# Patient Record
Sex: Female | Born: 1945
Health system: Southern US, Community
[De-identification: ages and names within clinical notes are randomized; demographics above are authoritative.]

## PROBLEM LIST (undated history)

## (undated) DIAGNOSIS — R7401 Elevation of levels of liver transaminase levels: Secondary | ICD-10-CM

## (undated) DIAGNOSIS — R531 Weakness: Secondary | ICD-10-CM

## (undated) DIAGNOSIS — K219 Gastro-esophageal reflux disease without esophagitis: Secondary | ICD-10-CM

## (undated) DIAGNOSIS — D696 Thrombocytopenia, unspecified: Secondary | ICD-10-CM

## (undated) DIAGNOSIS — I700 Atherosclerosis of aorta: Secondary | ICD-10-CM

## (undated) DIAGNOSIS — I10 Essential (primary) hypertension: Secondary | ICD-10-CM

## (undated) DIAGNOSIS — N390 Urinary tract infection, site not specified: Secondary | ICD-10-CM

## (undated) DIAGNOSIS — T8859XA Other complications of anesthesia, initial encounter: Secondary | ICD-10-CM

## (undated) DIAGNOSIS — B1910 Unspecified viral hepatitis B without hepatic coma: Secondary | ICD-10-CM

## (undated) DIAGNOSIS — I2510 Atherosclerotic heart disease of native coronary artery without angina pectoris: Secondary | ICD-10-CM

## (undated) DIAGNOSIS — K4090 Unilateral inguinal hernia, without obstruction or gangrene, not specified as recurrent: Secondary | ICD-10-CM

## (undated) DIAGNOSIS — K828 Other specified diseases of gallbladder: Secondary | ICD-10-CM

## (undated) DIAGNOSIS — N3001 Acute cystitis with hematuria: Secondary | ICD-10-CM

## (undated) DIAGNOSIS — R112 Nausea with vomiting, unspecified: Secondary | ICD-10-CM

## (undated) DIAGNOSIS — K5900 Constipation, unspecified: Secondary | ICD-10-CM

## (undated) DIAGNOSIS — R110 Nausea: Secondary | ICD-10-CM

## (undated) DIAGNOSIS — Z9889 Other specified postprocedural states: Secondary | ICD-10-CM

## (undated) DIAGNOSIS — I447 Left bundle-branch block, unspecified: Secondary | ICD-10-CM

## (undated) DIAGNOSIS — K589 Irritable bowel syndrome without diarrhea: Secondary | ICD-10-CM

## (undated) DIAGNOSIS — D72819 Decreased white blood cell count, unspecified: Secondary | ICD-10-CM

## (undated) DIAGNOSIS — N183 Chronic kidney disease, stage 3 unspecified: Secondary | ICD-10-CM

## (undated) DIAGNOSIS — Z7982 Long term (current) use of aspirin: Secondary | ICD-10-CM

## (undated) DIAGNOSIS — R87629 Unspecified abnormal cytological findings in specimens from vagina: Secondary | ICD-10-CM

## (undated) DIAGNOSIS — M7061 Trochanteric bursitis, right hip: Secondary | ICD-10-CM

## (undated) DIAGNOSIS — B370 Candidal stomatitis: Secondary | ICD-10-CM

## (undated) DIAGNOSIS — N189 Chronic kidney disease, unspecified: Secondary | ICD-10-CM

## (undated) DIAGNOSIS — R600 Localized edema: Secondary | ICD-10-CM

## (undated) DIAGNOSIS — A770 Spotted fever due to Rickettsia rickettsii: Secondary | ICD-10-CM

## (undated) DIAGNOSIS — N289 Disorder of kidney and ureter, unspecified: Secondary | ICD-10-CM

## (undated) DIAGNOSIS — E7800 Pure hypercholesterolemia, unspecified: Secondary | ICD-10-CM

## (undated) DIAGNOSIS — I83893 Varicose veins of bilateral lower extremities with other complications: Secondary | ICD-10-CM

## (undated) DIAGNOSIS — M1990 Unspecified osteoarthritis, unspecified site: Secondary | ICD-10-CM

## (undated) DIAGNOSIS — D649 Anemia, unspecified: Secondary | ICD-10-CM

## (undated) DIAGNOSIS — I209 Angina pectoris, unspecified: Secondary | ICD-10-CM

## (undated) HISTORY — PX: COLONOSCOPY: SHX174

## (undated) HISTORY — DX: Nausea: R11.0

## (undated) HISTORY — PX: TONSILLECTOMY: SUR1361

## (undated) HISTORY — DX: Disorder of kidney and ureter, unspecified: N28.9

## (undated) HISTORY — DX: Candidal stomatitis: B37.0

## (undated) HISTORY — DX: Acute cystitis with hematuria: N30.01

## (undated) HISTORY — DX: Unspecified abnormal cytological findings in specimens from vagina: R87.629

## (undated) HISTORY — DX: Localized edema: R60.0

## (undated) HISTORY — DX: Chronic kidney disease, unspecified: N18.9

## (undated) HISTORY — DX: Atherosclerotic heart disease of native coronary artery without angina pectoris: I25.10

## (undated) HISTORY — DX: Weakness: R53.1

## (undated) HISTORY — DX: Chronic kidney disease, stage 3 unspecified: N18.30

## (undated) HISTORY — DX: Other specified diseases of gallbladder: K82.8

## (undated) HISTORY — DX: Constipation, unspecified: K59.00

## (undated) HISTORY — DX: Urinary tract infection, site not specified: N39.0

---
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
## 1970-09-25 HISTORY — PX: DILATION AND CURETTAGE, DIAGNOSTIC / THERAPEUTIC: SUR384

## 1973-09-25 DIAGNOSIS — B1910 Unspecified viral hepatitis B without hepatic coma: Secondary | ICD-10-CM

## 1973-09-25 HISTORY — DX: Unspecified viral hepatitis B without hepatic coma: B19.10

## 1980-09-25 HISTORY — PX: DILATION AND CURETTAGE OF UTERUS: SHX78

## 1999-05-03 ENCOUNTER — Other Ambulatory Visit: Admission: RE | Admit: 1999-05-03 | Discharge: 1999-05-03 | Payer: Self-pay | Admitting: Gynecology

## 1999-05-10 ENCOUNTER — Encounter: Payer: Self-pay | Admitting: Gastroenterology

## 1999-05-10 ENCOUNTER — Ambulatory Visit (HOSPITAL_COMMUNITY): Admission: RE | Admit: 1999-05-10 | Discharge: 1999-05-10 | Payer: Self-pay | Admitting: Gastroenterology

## 1999-06-15 ENCOUNTER — Encounter: Payer: Self-pay | Admitting: Urology

## 1999-06-15 ENCOUNTER — Ambulatory Visit (HOSPITAL_COMMUNITY): Admission: RE | Admit: 1999-06-15 | Discharge: 1999-06-15 | Payer: Self-pay | Admitting: Urology

## 1999-07-22 ENCOUNTER — Encounter: Payer: Self-pay | Admitting: Urology

## 1999-07-22 ENCOUNTER — Ambulatory Visit (HOSPITAL_COMMUNITY): Admission: RE | Admit: 1999-07-22 | Discharge: 1999-07-22 | Payer: Self-pay | Admitting: Urology

## 2000-03-14 ENCOUNTER — Observation Stay (HOSPITAL_COMMUNITY): Admission: EM | Admit: 2000-03-14 | Discharge: 2000-03-15 | Payer: Self-pay

## 2001-08-29 ENCOUNTER — Ambulatory Visit (HOSPITAL_COMMUNITY): Admission: RE | Admit: 2001-08-29 | Discharge: 2001-08-29 | Payer: Self-pay | Admitting: Gastroenterology

## 2001-10-03 ENCOUNTER — Encounter: Payer: Self-pay | Admitting: Internal Medicine

## 2001-10-03 ENCOUNTER — Ambulatory Visit (HOSPITAL_COMMUNITY): Admission: RE | Admit: 2001-10-03 | Discharge: 2001-10-03 | Payer: Self-pay | Admitting: Internal Medicine

## 2002-05-19 ENCOUNTER — Encounter: Payer: Self-pay | Admitting: Gastroenterology

## 2002-05-19 ENCOUNTER — Ambulatory Visit (HOSPITAL_COMMUNITY): Admission: RE | Admit: 2002-05-19 | Discharge: 2002-05-19 | Payer: Self-pay | Admitting: Gastroenterology

## 2002-06-05 ENCOUNTER — Ambulatory Visit (HOSPITAL_COMMUNITY): Admission: RE | Admit: 2002-06-05 | Discharge: 2002-06-05 | Payer: Self-pay | Admitting: Gastroenterology

## 2002-10-02 ENCOUNTER — Other Ambulatory Visit: Admission: RE | Admit: 2002-10-02 | Discharge: 2002-10-02 | Payer: Self-pay | Admitting: Gynecology

## 2002-10-24 ENCOUNTER — Encounter: Payer: Self-pay | Admitting: Internal Medicine

## 2002-10-24 ENCOUNTER — Ambulatory Visit (HOSPITAL_COMMUNITY): Admission: RE | Admit: 2002-10-24 | Discharge: 2002-10-24 | Payer: Self-pay | Admitting: Internal Medicine

## 2003-12-01 ENCOUNTER — Other Ambulatory Visit: Admission: RE | Admit: 2003-12-01 | Discharge: 2003-12-01 | Payer: Self-pay | Admitting: Gynecology

## 2004-05-02 ENCOUNTER — Encounter: Admission: RE | Admit: 2004-05-02 | Discharge: 2004-05-02 | Payer: Self-pay | Admitting: Gastroenterology

## 2004-08-26 ENCOUNTER — Emergency Department (HOSPITAL_COMMUNITY): Admission: EM | Admit: 2004-08-26 | Discharge: 2004-08-26 | Payer: Self-pay | Admitting: Family Medicine

## 2004-09-01 ENCOUNTER — Ambulatory Visit (HOSPITAL_COMMUNITY): Admission: RE | Admit: 2004-09-01 | Discharge: 2004-09-01 | Payer: Self-pay | Admitting: *Deleted

## 2004-12-07 ENCOUNTER — Ambulatory Visit (HOSPITAL_COMMUNITY): Admission: RE | Admit: 2004-12-07 | Discharge: 2004-12-07 | Payer: Self-pay | Admitting: Gastroenterology

## 2005-10-26 ENCOUNTER — Encounter: Admission: RE | Admit: 2005-10-26 | Discharge: 2005-10-26 | Payer: Self-pay | Admitting: Internal Medicine

## 2006-07-17 ENCOUNTER — Ambulatory Visit (HOSPITAL_COMMUNITY): Admission: RE | Admit: 2006-07-17 | Discharge: 2006-07-17 | Payer: Self-pay | Admitting: Gastroenterology

## 2006-08-09 ENCOUNTER — Encounter: Admission: RE | Admit: 2006-08-09 | Discharge: 2006-08-09 | Payer: Self-pay | Admitting: Gastroenterology

## 2006-09-25 HISTORY — PX: WRIST FRACTURE SURGERY: SHX121

## 2007-07-10 ENCOUNTER — Ambulatory Visit (HOSPITAL_BASED_OUTPATIENT_CLINIC_OR_DEPARTMENT_OTHER): Admission: RE | Admit: 2007-07-10 | Discharge: 2007-07-10 | Payer: Self-pay | Admitting: Orthopedic Surgery

## 2008-07-15 ENCOUNTER — Emergency Department (HOSPITAL_COMMUNITY): Admission: EM | Admit: 2008-07-15 | Discharge: 2008-07-15 | Payer: Self-pay | Admitting: Family Medicine

## 2008-07-30 ENCOUNTER — Encounter (INDEPENDENT_AMBULATORY_CARE_PROVIDER_SITE_OTHER): Payer: Self-pay | Admitting: Cardiology

## 2008-07-30 ENCOUNTER — Ambulatory Visit (HOSPITAL_COMMUNITY): Admission: RE | Admit: 2008-07-30 | Discharge: 2008-07-30 | Payer: Self-pay | Admitting: Cardiology

## 2008-07-30 ENCOUNTER — Ambulatory Visit: Payer: Self-pay | Admitting: Surgery

## 2010-10-16 ENCOUNTER — Encounter: Payer: Self-pay | Admitting: Internal Medicine

## 2011-02-07 NOTE — Op Note (Signed)
NAMEALEETA, SCHMALTZ                ACCOUNT NO.:  [id number]   MEDICAL RECORD NO.:  [id number]          PATIENT TYPE:  AMB   LOCATION:  DSC                          FACILITY:  [facility]   PHYSICIAN:  Artist Pais. Weingold, M.D.DATE OF BIRTH:  May 31, 1946   DATE OF PROCEDURE:  07/10/2007  DATE OF DISCHARGE:                               OPERATIVE REPORT   PREOPERATIVE DIAGNOSIS:  Displaced fracture of distal radius, right, and  carpal tunnel syndrome.   POSTOPERATIVE DIAGNOSIS:  Displaced fracture of distal radius, right,  and carpal tunnel syndrome.   PROCEDURE:  Open reduction and internal fixation, right distal radius  fracture, with DVR plate and screws as well as right carpal tunnel  release through separate incision.   SURGEON:  Artist Pais. Mina Marble, M.D.   ASSISTANT:  None.   ANESTHESIA:  General.   TOURNIQUET TIME:  52 minutes.   COMPLICATIONS:  None.   DRAINS:  None.   PROCEDURE IN DETAIL:  The patient was taken to the operating suite after  the induction of adequate general anesthesia.  The right upper extremity  was prepped and draped in the usual sterile fashion.  An Esmarch was  used to exsanguinate the limb.  Tourniquet was then inflated to 250 mm.  At this point in time, incision was made over the palpable border of the  flexor carpi radialis tendon, right wrist skin was incised  longitudinally for 6 to 8 cm.  The sheath overlying the FCR was incised.  The FCR was retracted to the midline, the radial artery to the lateral  side.  Interval was developed.  Dissection was carried down to the  pronator quadratus.  The pronator quadratus was subperiosteally stripped  off the distal radius, identifying the fracture site.  The fracture site  was then reduced with manual pressure on the distal fragment and ulnar  deviation and flexion of the hand reduction was achieved and confirmed  under fluoroscopy.  At this point in time, DVR standard right plate was  fastened to the  lower aspect of distal radius, fixed through the slotted  hole with a cortical screw.  Intraoperative fluoroscopy revealed good  reduction in both the AP lateral and oblique view, with good placement  of hardware.  Remaining cortical screws were placed proximally, followed  by the remainder of the smooth pegs distally.  At the end of procedure,  intraoperative fluoroscopy revealed near anatomic reduction in the AP,  lateral and oblique view.  Wound was irrigated and loosely closed with 3-  0 Prolene subcuticular stitch.  Prior to wound closure, the pronator  quadratus was repaired using 0 Vicryl.  There was also a  separate  incision made in the palmar aspect of the right hand, 2 cm in length.  Dissection was carried down through the skin and subcutaneous tissues.  Palmar fascia was identified and split.  The distal edge of the  transverse carpal ligament was identified, split with a 15 blade.  The  median nerve was identified, protected with a Therapist, nutritional.  The  remaining aspects of the transverse carpal were divided under  direct  vision using curved blunt scissors.  Canal was inspected.  There was a  fair amount blood in there which was irrigated out.  This, too was  loosely closed with 3-0 Prolene subcuticular stitch.  Steri-Strips, 4x4s  fluffs and a volar splint were applied.  The patient tolerated this  procedure as well and went to recovery in stable fashion.      Artist Pais Mina Marble, M.D.  Electronically Signed     MAW/MEDQ  D:  07/10/2007  T:  07/11/2007  Job:  [id number]

## 2011-02-10 NOTE — Cardiovascular Report (Signed)
Amanda Lambert, Amanda Lambert                ACCOUNT NO.:  [id number]   MEDICAL RECORD NO.:  [id number]          PATIENT TYPE:  OIB   LOCATION:  2899                         FACILITY:  [facility]   PHYSICIAN:  Meade Maw, M.D.    DATE OF BIRTH:  July 25, 1946   DATE OF PROCEDURE:  09/01/2004  DATE OF DISCHARGE:                              CARDIAC CATHETERIZATION   INDICATIONS FOR PROCEDURE:  65 year old female with ongoing chest pain,  reproducible ischemia in the mid to distal anterior region with evidence of  reversal.   PROCEDURE:  After obtaining written informed consent, the patient was  brought to the cardiac catheterization lab in a post absorptive state.  Preop sedation was achieved using IV Versed.  The right groin was prepped  and draped in the usual sterile fashion.  Local anesthesia was achieved  using 1% Xylocaine.  A 6 French hemostasis sheath was placed into the right  femoral artery.  Using a modified Seldinger technique, selective coronary  angiography was performed using a JL4 and  JR4 Judkins catheters.  Multiple  views were obtained.  All catheter exchanges were made over a guide-wire.  A  single plane ventriculogram was performed in the RAO position using the 6  French pigtail curved catheter.  There was on identifiable disease.  The  patient was transferred to the holding area.  The hemostasis sheath was  removed and hemostasis was achieved using a fem/stop device.  There were no  immediate complications.   FINDINGS:  Aortic pressure 136/78, LV pressure 140/7, EDP 17.  Single plane  ventriculogram revealed normal wall motion.  Ejection fraction approximately  60%.   Coronary angiography reveals the left main coronary artery bifurcates in to  the left anterior descending and circumflex vessel.  There is no disease  noted in the left main coronary artery.  Left anterior descending gives rise  to a small D1, moderate D2, the left anterior descending goes on to end as a  tortuous tapering branch.  The circumflex vessel is a moderate size vessel  and gives rise to an OM1, goes on in as a lateral branch.  There is no  disease noted in the circumflex or its branches.  The right coronary artery  is a large dominant artery and gives rise to several RV marginals, PDA, and  APL branch.  There is no disease noted in the right coronary artery.   FINAL IMPRESSION:  1.  Normal coronary angiography.  2.  Normal single plane ventriculogram.  3.  Continue with medical management for hypertension.  4.  Consider other etiologies for her chest pain.      HP/MEDQ  D:  09/01/2004  T:  09/01/2004  Job:  [id number]   cc:   Lilla Shook, M.D.  [address], [address]  [place]  [place] [address]  Fax: [phone]

## 2011-02-10 NOTE — Procedures (Signed)
[place]  Patient:    Amanda Lambert, Amanda Lambert Visit Number: [id number] MRN: [id number]          Service Type: Attending:  Llana Aliment. Randa Evens, M.D. Dictated by:   Llana Aliment. Randa Evens, M.D. Proc. Date: 08/29/01   CC:         Pearla Dubonnet, M.D.   Procedure Report  PROCEDURE PERFORMED:  Esophagogastroduodenscopy with biopsies.  ENDOSCOPIST:  Llana Aliment. Randa Evens, M.D.  MEDICATIONS:  Cetacaine spray, fentanyl 50 mcg, Versed 5 mg IV.  INDICATIONS:  Dyspepsia, water brash.  DESCRIPTION OF PROCEDURE:  The procedure had been explained to the patient and consent obtained.  With the patient in the left lateral decubitus position, the left lateral decubitus position, the Olympus video endoscope was inserted blindly in the esophagus and advanced under direct visualization.  The stomach was entered, pylorus identified and passed.  The duodenum including the bulb and second portion were seen well and unremarkable.  The scope was withdrawn back into the stomach.  Streaky gastritis in the antrum with no frank ulcerations, no bleeding.  A biopsy was taken for rapid urease test for Helicobacter pylori.  Fundus and cardia were seen well on the retroflex view and were normal. The GE junction was widely patent.  The distal esophagus was not reddened or inflamed.  The proximal esophagus was normal.  The scope was withdrawn and the patient tolerated the procedure well.  ASSESSMENT: 1. Gastritis with erosions. 2. Hiatal hernia with gastroesophageal reflux disease.  PLAN:  Will continue on Nexium and Reglan, check tests for Helicobacter pylori.  See back in the office in three months, give a reflux sheet.Dictated by:   Llana Aliment. Randa Evens, M.D. Attending:  Llana Aliment. Randa Evens, M.D. DD:  08/29/01 TD:  08/29/01 Job: [id number]

## 2011-02-10 NOTE — Discharge Summary (Signed)
[facility]  Patient:    Amanda Lambert, Amanda Lambert                       MRN: [id number] Adm. Date:  [id number] Disc. Date: [id number] Attending:  Dennison Bulla Ii                           Discharge Summary  HISTORY OF PRESENT ILLNESS:  A 65 year old white female admitted to the hospital with probable angioedema secondary to Altace versus a possible antibiotic reaction.  She had significant difficulty in breathing, perhaps hyperventilation, diarrhea, and severe abdominal pain.  Given morphine, Phenergan, and Solu-Medrol and admitted to the hospital.  In the hospital, she had no further discomfort.  Her abdomen still felt sore.  Blood pressures were normal.  She was able to eat without difficulty.  She was discharged on March 15, 2000.  LABORATORY DATA:  Hemoglobin 12.8 g%, hematocrit 38.6%, white count 11,800, 88 polys, 11 lymphocytes, 1 monocyte, 0 eosinophils, 0 basophils, and no large identified cells.  The platelet count was normal at 333,000.  Amylase normal at 77.  Lipase normal at 24.  Electrolytes:  Sodium 139, potassium 3.7, chloride 107, CO2 26, glucose nonfasting 173, BUN 16, creatinine 1.0.  The remainder of the CMET was normal.  The PT and PTT were normal.  Abdominal exam with chest x-ray with no acute disease.  HOSPITAL COURSE:  The patient was given Demerol, Phenergan, and Solu-Medrol in the emergency room.  That settled down.  She had no further significant pain and required no additional medication.  Her blood pressure was normal in the hospital without postural changes.  Accordingly, she was eating and the abdominal pain had pretty much resolved.  DISPOSITION:  She was discharged from the hospital on March 15, 2000.  DISCHARGE DIAGNOSES: 1. 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 2. Hypertension.  DISCHARGE MEDICATIONS:  No Altace and no medicines.  DIET:  Now as desired.  ACTIVITY:  No  restrictions.  FOLLOW-UP:  Keep appointment scheduled for March 19, 2000.  SPECIAL INSTRUCTIONS:  None.  CONDITION ON DISCHARGE:  Improved. DD:  03/15/00 TD:  03/17/00 Job: [id number]

## 2011-02-10 NOTE — Op Note (Signed)
   NAME:  Amanda Lambert, Amanda Lambert                          ACCOUNT NO.:  [id number]   MEDICAL RECORD NO.:  [id number]                   PATIENT TYPE:  AMB   LOCATION:  ENDO                                 FACILITY:  [facility]   PHYSICIAN:  James L. Malon Kindle., M.D.          DATE OF BIRTH:  23-Jan-1946   DATE OF PROCEDURE:  06/05/2002  DATE OF DISCHARGE:                                 OPERATIVE REPORT   PROCEDURE PERFORMED:  Colonoscopy.   ENDOSCOPIST:  Llana Aliment. Edwards, M.D.   MEDICATIONS:  Fentanyl 90 mcg, Versed 8 mg IV.   INSTRUMENT USED:  Pediatric Olympus video colonoscope.   INDICATIONS FOR PROCEDURE:  The patient had heme positive stool and a recent  episode of what was presumed to be diverticulitis, got much better with  antibiotics.   DESCRIPTION OF PROCEDURE:  The procedure had been explained to the patient  and consent obtained.  With the patient in the left lateral decubitus  position, the pediatric Olympus video colonoscope was inserted and advanced  under direct visualization.  The prep was quite good.  The patient had  marked diverticular disease in the sigmoid colon.  After we were able to  pass the sigmoid colon, we were able to advance fairly easily to the cecum.  The ileocecal valve and appendiceal orifice were seen.  The prep was quite  good.  The cecum, ascending colon were seen well.  There was  pandiverticulosis with some diverticula even over into the right colon.  Most of these were fairly large mouthed diverticula.  The transverse colon,  splenic flexure, descending colon were seen well.  Some diverticula in the  left colon.  The majority was seen in the sigmoid colon. No polyps were  seen.  No tumor masses.  The rectum was free of polyps or other lesions.  The scope was withdrawn.  The patient tolerated the procedure well.   ASSESSMENT:  1. Heme positive stool probably due to diverticulitis.  2. Currently diverticula without any evidence of active  diverticulitis.    PLAN:  Will give a diverticulosis sheet, suggest Metamucil and fiber  supplement.  See back in the office in three to four months.  Will have her  keep some antibiotics at home in case she has another flare.                                               James L. Malon Kindle., M.D.    Waldron Session  D:  06/05/2002  T:  06/05/2002  Job:  [id number]   cc:   Lilla Shook, M.D.  [address], [address]  [place]  [place]  [address]  Fax: [phone]

## 2011-04-03 ENCOUNTER — Other Ambulatory Visit (HOSPITAL_COMMUNITY): Payer: Self-pay | Admitting: Internal Medicine

## 2011-04-03 DIAGNOSIS — K8020 Calculus of gallbladder without cholecystitis without obstruction: Secondary | ICD-10-CM

## 2011-04-05 ENCOUNTER — Ambulatory Visit (HOSPITAL_COMMUNITY)
Admission: RE | Admit: 2011-04-05 | Discharge: 2011-04-05 | Disposition: A | Discharge status: Home / Self Care | Payer: 59 | Source: Ambulatory Visit | Attending: Internal Medicine | Admitting: Internal Medicine

## 2011-04-05 DIAGNOSIS — R109 Unspecified abdominal pain: Secondary | ICD-10-CM | POA: Insufficient documentation

## 2011-04-05 DIAGNOSIS — I10 Essential (primary) hypertension: Secondary | ICD-10-CM | POA: Insufficient documentation

## 2011-04-05 DIAGNOSIS — K8020 Calculus of gallbladder without cholecystitis without obstruction: Secondary | ICD-10-CM

## 2011-04-05 DIAGNOSIS — R197 Diarrhea, unspecified: Secondary | ICD-10-CM | POA: Insufficient documentation

## 2011-04-06 ENCOUNTER — Other Ambulatory Visit (HOSPITAL_COMMUNITY): Payer: Self-pay | Admitting: Internal Medicine

## 2011-04-06 DIAGNOSIS — K8020 Calculus of gallbladder without cholecystitis without obstruction: Secondary | ICD-10-CM

## 2011-04-06 DIAGNOSIS — R197 Diarrhea, unspecified: Secondary | ICD-10-CM

## 2011-04-17 ENCOUNTER — Encounter (HOSPITAL_COMMUNITY)
Admission: RE | Admit: 2011-04-17 | Discharge: 2011-04-17 | Disposition: A | Discharge status: Home / Self Care | Payer: 59 | Source: Ambulatory Visit | Attending: Internal Medicine | Admitting: Internal Medicine

## 2011-04-17 DIAGNOSIS — R109 Unspecified abdominal pain: Secondary | ICD-10-CM | POA: Insufficient documentation

## 2011-04-17 DIAGNOSIS — R197 Diarrhea, unspecified: Secondary | ICD-10-CM | POA: Insufficient documentation

## 2011-04-17 DIAGNOSIS — K8020 Calculus of gallbladder without cholecystitis without obstruction: Secondary | ICD-10-CM | POA: Insufficient documentation

## 2011-04-17 MED ORDER — TECHNETIUM TC 99M MEBROFENIN IV KIT
5.0000 | PACK | Freq: Once | INTRAVENOUS | Status: AC | PRN
  Administered 2011-04-17: 5 via INTRAVENOUS

## 2011-07-05 LAB — POCT HEMOGLOBIN-HEMACUE
Hemoglobin: 14.3
Operator id: 123881

## 2011-07-06 LAB — BASIC METABOLIC PANEL
BUN: 13
CO2: 29
Calcium: 9.4
Chloride: 103
Creatinine, Ser: 0.86
GFR calc Af Amer: >60
GFR calc non Af Amer: >60
Glucose, Bld: 137 — ABNORMAL HIGH
Potassium: 3.8
Sodium: 140

## 2012-03-30 ENCOUNTER — Encounter (HOSPITAL_BASED_OUTPATIENT_CLINIC_OR_DEPARTMENT_OTHER): Payer: Self-pay | Admitting: *Deleted

## 2012-03-30 ENCOUNTER — Emergency Department (HOSPITAL_BASED_OUTPATIENT_CLINIC_OR_DEPARTMENT_OTHER)
Admission: EM | Admit: 2012-03-30 | Discharge: 2012-03-30 | Disposition: A | Discharge status: Home / Self Care | Payer: 59 | Attending: Emergency Medicine | Admitting: Emergency Medicine

## 2012-03-30 DIAGNOSIS — B349 Viral infection, unspecified: Secondary | ICD-10-CM

## 2012-03-30 DIAGNOSIS — B9789 Other viral agents as the cause of diseases classified elsewhere: Secondary | ICD-10-CM | POA: Insufficient documentation

## 2012-03-30 HISTORY — DX: Essential (primary) hypertension: I10

## 2012-03-30 LAB — COMPREHENSIVE METABOLIC PANEL
AST: 94 U/L — ABNORMAL HIGH (ref 0–37)
BUN: 11 mg/dL (ref 6–23)
CO2: 25 mEq/L (ref 19–32)
Chloride: 102 mEq/L (ref 96–112)
Creatinine, Ser: 0.9 mg/dL (ref 0.50–1.10)
GFR calc Af Amer: 76 mL/min — ABNORMAL LOW (ref >90)
GFR calc non Af Amer: 66 mL/min — ABNORMAL LOW (ref >90)
Glucose, Bld: 126 mg/dL — ABNORMAL HIGH (ref 70–99)
Potassium: 3.3 mEq/L — ABNORMAL LOW (ref 3.5–5.1)
Total Bilirubin: 0.3 mg/dL (ref 0.3–1.2)

## 2012-03-30 LAB — URINALYSIS, ROUTINE W REFLEX MICROSCOPIC
Nitrite: NEGATIVE
Protein, ur: NEGATIVE mg/dL
Specific Gravity, Urine: 1.017 (ref 1.005–1.030)
Urobilinogen, UA: 0.2 mg/dL (ref 0.0–1.0)
pH: 6 (ref 5.0–8.0)

## 2012-03-30 LAB — CBC WITH DIFFERENTIAL/PLATELET
HCT: 37.6 % (ref 36.0–46.0)
MCH: 28.3 pg (ref 26.0–34.0)
MCV: 83.7 fL (ref 78.0–100.0)
Monocytes Absolute: 0.2 10*3/uL (ref 0.1–1.0)
Monocytes Relative: 5 % (ref 3–12)
Neutrophils Relative %: 86 % — ABNORMAL HIGH (ref 43–77)
Platelets: 210 10*3/uL (ref 150–400)
WBC: 3.6 10*3/uL — ABNORMAL LOW (ref 4.0–10.5)

## 2012-03-30 LAB — URINE MICROSCOPIC-ADD ON

## 2012-03-30 MED ORDER — ACETAMINOPHEN 325 MG PO TABS
650.0000 mg | ORAL_TABLET | Freq: Once | ORAL | Status: AC
  Administered 2012-03-30: 650 mg via ORAL
  Filled 2012-03-30: qty 2

## 2012-03-30 MED ORDER — SODIUM CHLORIDE 0.9 % IV BOLUS (SEPSIS)
1000.0000 mL | Freq: Once | INTRAVENOUS | Status: AC
  Administered 2012-03-30: 1000 mL via INTRAVENOUS

## 2012-03-30 MED ORDER — ONDANSETRON HCL 4 MG/2ML IJ SOLN
4.0000 mg | Freq: Once | INTRAMUSCULAR | Status: AC
  Administered 2012-03-30: 4 mg via INTRAVENOUS
  Filled 2012-03-30: qty 2

## 2012-03-30 NOTE — ED Notes (Signed)
Unable to provide urine specimen at this time, IV infusing

## 2012-03-30 NOTE — ED Notes (Signed)
Patient here with fever, chills and bodyaches x 2 days. Taking ibuprofen and otc meds w/o relief. Patient pale but warm to touch, alert and oriented

## 2012-03-30 NOTE — ED Provider Notes (Addendum)
History   This chart was scribed for Amanda Sprout, MD by Sofie Rower. The patient was seen in room MH09/MH09 and the patient's care was started at 7:04 PM     CSN: [id number]  Arrival date & time 03/30/12  2130   First MD Initiated Contact with Patient 03/30/12 1902      Chief Complaint  Patient presents with  . Generalized Body Aches    (Consider location/radiation/quality/duration/timing/severity/associated sxs/prior treatment) Patient is a 66 y.o. female presenting with fever. The history is provided by the patient. No language interpreter was used.  Fever Primary symptoms of the febrile illness include fever and myalgias. The current episode started yesterday. This is a new problem. The problem has not changed since onset. The fever began yesterday. The fever has been unchanged since its onset. The maximum temperature recorded prior to her arrival was 101 to 101.9 F. The temperature was taken by a tympanic thermometer.  Myalgias began yesterday. The myalgias have been unchanged since their onset. The myalgias are generalized. The myalgias are aching. The discomfort from the myalgias is moderate.    RANEISHA Lambert is a 66 y.o. female who presents to the Emergency Department complaining of moderate, episodic body aches onset yesterday with associated symptoms of fever (101), chills, myalgias, sore throat, nausea. The pt informs the EDP that if she does not drink a good amount of water, her urine takes on an abnormal odor. Modifying factors include taking tylenol and ibuprofen (last dosage of ibuprofen taken at 4:30PM) which provide moderate relief. Pt has a hx of frequent urinary tract infections (4-5 since September 2012), hypertension, taking nexium.    Pt denies vomiting, cough, SOB. abd pain, diarrhea, dysuria, bladder prolapse, recent kidney stones, tick bites.       Past Medical History  Diagnosis Date  . Hypertension     Past Surgical History  Procedure Date  .  Appendectomy     History reviewed. No pertinent family history.  History  Substance Use Topics  . Smoking status: Never Smoker   . Smokeless tobacco: Not on file  . Alcohol Use: No    OB History    Grav Para Term Preterm Abortions TAB SAB Ect Mult Living                  Review of Systems  Constitutional: Positive for fever.  Musculoskeletal: Positive for myalgias.  All other systems reviewed and are negative.    10 Systems reviewed and all are negative for acute change except as noted in the HPI.    Allergies  Thorazine; Ace inhibitors; and Compazine  Home Medications   Current Outpatient Rx  Name Route Sig Dispense Refill  . ACETAMINOPHEN 325 MG PO TABS Oral Take 650 mg by mouth every 6 (six) hours as needed. Patient used this medication for aches and pains.    Marland Kitchen ESOMEPRAZOLE MAGNESIUM 40 MG PO CPDR Oral Take 40 mg by mouth daily before breakfast. Patient uses this medication for acid reflux.    . IBUPROFEN 200 MG PO TABS Oral Take 400 mg by mouth every 6 (six) hours as needed. Patient used this medication for fever.    Marland Kitchen METOPROLOL TARTRATE 50 MG PO TABS Oral Take 50 mg by mouth 2 (two) times daily. Patient took this medication at 9 am this morning.    . TRIAMTERENE-HCTZ 75-50 MG PO TABS Oral Take 1 tablet by mouth daily.      BP 142/79  Pulse 116  Temp 99.1  F (37.3 C) (Oral)  Resp 20  Ht 5\' 8"  (1.727 m)  Wt 200 lb (90.719 kg)  BMI 30.41 kg/m2  SpO2 98%  Physical Exam  Nursing note and vitals reviewed. Constitutional: She appears well-developed and well-nourished.  HENT:  Head: Atraumatic.  Right Ear: Tympanic membrane and external ear normal.  Left Ear: Tympanic membrane and external ear normal.  Nose: Nose normal.  Mouth/Throat: Oropharynx is clear and moist.  Neck: Normal range of motion.  Cardiovascular: Normal rate, regular rhythm and normal heart sounds.   Pulmonary/Chest: Effort normal and breath sounds normal. She has no wheezes.    Abdominal: Soft. There is no tenderness.  Musculoskeletal: Normal range of motion. She exhibits no edema.  Lymphadenopathy:    She has no cervical adenopathy.  Skin: Skin is warm and dry. No rash noted.  Psychiatric: She has a normal mood and affect. Her behavior is normal.    ED Course  Procedures (including critical care time)  DIAGNOSTIC STUDIES: Oxygen Saturation is 98% on room air, normal by my interpretation.    COORDINATION OF CARE:  7:10PM- EDP at bedside discusses treatment plan concerning urine sample, IV fluids.     Labs Reviewed  CBC WITH DIFFERENTIAL - Abnormal; Notable for the following:    WBC 3.6 (*)     Neutrophils Relative 86 (*)     Lymphocytes Relative 9 (*)     Lymphs Abs 0.3 (*)     All other components within normal limits  COMPREHENSIVE METABOLIC PANEL - Abnormal; Notable for the following:    Potassium 3.3 (*)     Glucose, Bld 126 (*)     Albumin 3.4 (*)     AST 94 (*)     ALT 65 (*)     GFR calc non Af Amer 66 (*)     GFR calc Af Amer 76 (*)     All other components within normal limits  URINALYSIS, ROUTINE W REFLEX MICROSCOPIC - Abnormal; Notable for the following:    Hgb urine dipstick TRACE (*)     Leukocytes, UA TRACE (*)     All other components within normal limits  URINE MICROSCOPIC-ADD ON - Abnormal; Notable for the following:    Bacteria, UA FEW (*)     All other components within normal limits   No results found.   1. Viral syndrome       MDM   Pt with symptoms consistent with influenza or other viral syndrome with myalgias and fever.  Normal exam here but is febrile.  No signs of breathing difficulty  No signs of strep pharyngitis, otitis or abnormal abdominal findings.   Labs and UA are wnl.  Will continue antipyretica and rest and fluids and return for any further problems.;       I personally performed the services described in this documentation, which was scribed in my presence.  The recorded information has  been reviewed and considered.   Amanda Sprout, MD 03/30/12 2107  Amanda Sprout, MD 03/30/12 2108

## 2012-03-30 NOTE — ED Notes (Signed)
Pt states she has had fever, chills, body aches since yesterday. No relief with OTC meds.

## 2012-04-01 ENCOUNTER — Emergency Department (HOSPITAL_COMMUNITY): Payer: 59

## 2012-04-01 ENCOUNTER — Encounter (HOSPITAL_COMMUNITY): Payer: Self-pay | Admitting: Emergency Medicine

## 2012-04-01 ENCOUNTER — Inpatient Hospital Stay (HOSPITAL_COMMUNITY)
Admission: EM | Admit: 2012-04-01 | Discharge: 2012-04-03 | DRG: 868 | Disposition: A | Discharge status: Home / Self Care | Payer: 59 | Source: Ambulatory Visit | Attending: Internal Medicine | Admitting: Internal Medicine

## 2012-04-01 DIAGNOSIS — A779 Spotted fever, unspecified: Principal | ICD-10-CM | POA: Diagnosis present

## 2012-04-01 DIAGNOSIS — Z8249 Family history of ischemic heart disease and other diseases of the circulatory system: Secondary | ICD-10-CM

## 2012-04-01 DIAGNOSIS — R7401 Elevation of levels of liver transaminase levels: Secondary | ICD-10-CM | POA: Diagnosis present

## 2012-04-01 DIAGNOSIS — D696 Thrombocytopenia, unspecified: Secondary | ICD-10-CM | POA: Diagnosis present

## 2012-04-01 DIAGNOSIS — D72819 Decreased white blood cell count, unspecified: Secondary | ICD-10-CM | POA: Diagnosis present

## 2012-04-01 DIAGNOSIS — B370 Candidal stomatitis: Secondary | ICD-10-CM | POA: Diagnosis present

## 2012-04-01 DIAGNOSIS — Z9089 Acquired absence of other organs: Secondary | ICD-10-CM

## 2012-04-01 DIAGNOSIS — K219 Gastro-esophageal reflux disease without esophagitis: Secondary | ICD-10-CM | POA: Diagnosis present

## 2012-04-01 DIAGNOSIS — R509 Fever, unspecified: Secondary | ICD-10-CM | POA: Diagnosis present

## 2012-04-01 DIAGNOSIS — R7402 Elevation of levels of lactic acid dehydrogenase (LDH): Secondary | ICD-10-CM | POA: Diagnosis present

## 2012-04-01 DIAGNOSIS — R109 Unspecified abdominal pain: Secondary | ICD-10-CM

## 2012-04-01 DIAGNOSIS — A770 Spotted fever due to Rickettsia rickettsii: Secondary | ICD-10-CM

## 2012-04-01 DIAGNOSIS — N390 Urinary tract infection, site not specified: Secondary | ICD-10-CM | POA: Diagnosis present

## 2012-04-01 DIAGNOSIS — I10 Essential (primary) hypertension: Secondary | ICD-10-CM | POA: Diagnosis present

## 2012-04-01 HISTORY — DX: Nausea with vomiting, unspecified: R11.2

## 2012-04-01 HISTORY — DX: Other specified postprocedural states: Z98.890

## 2012-04-01 HISTORY — DX: Other specified postprocedural states: R11.2

## 2012-04-01 HISTORY — DX: Spotted fever due to Rickettsia rickettsii: A77.0

## 2012-04-01 LAB — URINALYSIS, ROUTINE W REFLEX MICROSCOPIC
Glucose, UA: NEGATIVE mg/dL
Nitrite: NEGATIVE
Specific Gravity, Urine: 1.019 (ref 1.005–1.030)
pH: 6 (ref 5.0–8.0)

## 2012-04-01 LAB — HIV ANTIBODY (ROUTINE TESTING W REFLEX): HIV: NONREACTIVE

## 2012-04-01 LAB — CBC WITH DIFFERENTIAL/PLATELET
Basophils Absolute: 0 10*3/uL (ref 0.0–0.1)
Eosinophils Relative: 0 % (ref 0–5)
Hemoglobin: 13.1 g/dL (ref 12.0–15.0)
MCH: 28.7 pg (ref 26.0–34.0)
MCHC: 34.3 g/dL (ref 30.0–36.0)
Monocytes Absolute: 0.1 10*3/uL (ref 0.1–1.0)
Monocytes Relative: 3 % (ref 3–12)
Neutrophils Relative %: 81 % — ABNORMAL HIGH (ref 43–77)
RDW: 12.8 % (ref 11.5–15.5)
WBC: 2.4 10*3/uL — ABNORMAL LOW (ref 4.0–10.5)

## 2012-04-01 LAB — URINE MICROSCOPIC-ADD ON

## 2012-04-01 LAB — COMPREHENSIVE METABOLIC PANEL
AST: 158 U/L — ABNORMAL HIGH (ref 0–37)
CO2: 25 mEq/L (ref 19–32)
GFR calc non Af Amer: 55 mL/min — ABNORMAL LOW (ref >90)
Sodium: 137 mEq/L (ref 135–145)
Total Bilirubin: 1.4 mg/dL — ABNORMAL HIGH (ref 0.3–1.2)
Total Protein: 6.8 g/dL (ref 6.0–8.3)

## 2012-04-01 LAB — POCT I-STAT TROPONIN I

## 2012-04-01 MED ORDER — OXYCODONE HCL 5 MG PO TABS
5.0000 mg | ORAL_TABLET | ORAL | Status: DC | PRN
  Administered 2012-04-01 – 2012-04-02 (×2): 5 mg via ORAL
  Filled 2012-04-01 (×2): qty 1

## 2012-04-01 MED ORDER — TRIAMTERENE-HCTZ 75-50 MG PO TABS
1.0000 | ORAL_TABLET | Freq: Every day | ORAL | Status: DC
  Administered 2012-04-01: 1 via ORAL
  Filled 2012-04-01 (×2): qty 1

## 2012-04-01 MED ORDER — METOPROLOL TARTRATE 50 MG PO TABS
50.0000 mg | ORAL_TABLET | Freq: Two times a day (BID) | ORAL | Status: DC
  Administered 2012-04-01 – 2012-04-03 (×5): 50 mg via ORAL
  Filled 2012-04-01 (×7): qty 1

## 2012-04-01 MED ORDER — NYSTATIN 100000 UNIT/ML MT SUSP
5.0000 mL | Freq: Four times a day (QID) | OROMUCOSAL | Status: DC
  Administered 2012-04-01 – 2012-04-03 (×9): 500000 [IU] via ORAL
  Filled 2012-04-01 (×12): qty 5

## 2012-04-01 MED ORDER — PANTOPRAZOLE SODIUM 40 MG PO TBEC
40.0000 mg | DELAYED_RELEASE_TABLET | Freq: Every day | ORAL | Status: DC
  Administered 2012-04-01 – 2012-04-03 (×3): 40 mg via ORAL
  Filled 2012-04-01 (×2): qty 1

## 2012-04-01 MED ORDER — ONDANSETRON HCL 4 MG PO TABS
4.0000 mg | ORAL_TABLET | Freq: Four times a day (QID) | ORAL | Status: DC | PRN
  Filled 2012-04-01: qty 1

## 2012-04-01 MED ORDER — MORPHINE SULFATE 2 MG/ML IJ SOLN
2.0000 mg | INTRAMUSCULAR | Status: DC | PRN
  Administered 2012-04-01 – 2012-04-02 (×2): 2 mg via INTRAVENOUS
  Filled 2012-04-01 (×2): qty 1

## 2012-04-01 MED ORDER — MORPHINE SULFATE 4 MG/ML IJ SOLN
4.0000 mg | Freq: Once | INTRAMUSCULAR | Status: AC
  Administered 2012-04-01: 4 mg via INTRAVENOUS
  Filled 2012-04-01: qty 1

## 2012-04-01 MED ORDER — DOXYCYCLINE HYCLATE 100 MG PO TABS
100.0000 mg | ORAL_TABLET | Freq: Two times a day (BID) | ORAL | Status: DC
  Administered 2012-04-01 – 2012-04-03 (×5): 100 mg via ORAL
  Filled 2012-04-01 (×6): qty 1

## 2012-04-01 MED ORDER — ONDANSETRON HCL 4 MG/2ML IJ SOLN
4.0000 mg | Freq: Four times a day (QID) | INTRAMUSCULAR | Status: DC | PRN
  Administered 2012-04-01 – 2012-04-02 (×2): 4 mg via INTRAVENOUS
  Filled 2012-04-01 (×2): qty 2

## 2012-04-01 MED ORDER — ACETAMINOPHEN 650 MG RE SUPP: 650.0000 mg | Freq: Four times a day (QID) | RECTAL | Status: DC | PRN

## 2012-04-01 MED ORDER — PNEUMOCOCCAL VAC POLYVALENT 25 MCG/0.5ML IJ INJ
0.5000 mL | INJECTION | INTRAMUSCULAR | Status: AC
  Administered 2012-04-02: 0.5 mL via INTRAMUSCULAR
  Filled 2012-04-01: qty 0.5

## 2012-04-01 MED ORDER — SODIUM CHLORIDE 0.9 % IV SOLN
INTRAVENOUS | Status: DC
  Administered 2012-04-01: 75 mL/h via INTRAVENOUS
  Administered 2012-04-01 – 2012-04-02 (×3): via INTRAVENOUS

## 2012-04-01 MED ORDER — CIPROFLOXACIN HCL 500 MG PO TABS
500.0000 mg | ORAL_TABLET | Freq: Two times a day (BID) | ORAL | Status: DC
  Administered 2012-04-01: 500 mg via ORAL
  Filled 2012-04-01 (×3): qty 1

## 2012-04-01 MED ORDER — ONDANSETRON HCL 4 MG/2ML IJ SOLN
4.0000 mg | Freq: Once | INTRAMUSCULAR | Status: AC
  Administered 2012-04-01: 4 mg via INTRAVENOUS
  Filled 2012-04-01: qty 2

## 2012-04-01 MED ORDER — DOCUSATE SODIUM 100 MG PO CAPS
100.0000 mg | ORAL_CAPSULE | Freq: Two times a day (BID) | ORAL | Status: DC
  Administered 2012-04-01 – 2012-04-03 (×5): 100 mg via ORAL
  Filled 2012-04-01 (×5): qty 1

## 2012-04-01 MED ORDER — ACETAMINOPHEN 325 MG PO TABS: 650.0000 mg | ORAL_TABLET | Freq: Four times a day (QID) | ORAL | Status: DC | PRN

## 2012-04-01 NOTE — ED Notes (Signed)
Pt has white patches on tongue.

## 2012-04-01 NOTE — Progress Notes (Signed)
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  Pcp:  ?[facility]   Per Company secretary admit to [facility]

## 2012-04-01 NOTE — ED Notes (Signed)
Lab at bedside drawing blood cultures and labs.

## 2012-04-01 NOTE — ED Notes (Signed)
Lab done drawing blood cultures.

## 2012-04-01 NOTE — Consult Note (Signed)
[facility]          Day 1 doxycycline        Day 1 ciprofloxacin       Reason for Consult: Fever, leukopenia, thrombocytopenia and mild hepatitis    Referring Physician: Dr. Ricke Hey  Active Problems:  Fever  Transaminitis  UTI (lower urinary tract infection)  Leukopenia  Thrombocytopenia  HTN (hypertension)  GERD (gastroesophageal reflux disease)  Oral thrush      . ciprofloxacin  500 mg Oral BID  . docusate sodium  100 mg Oral BID  . doxycycline  100 mg Oral Q12H  . metoprolol  50 mg Oral BID  .  morphine injection  4 mg Intravenous Once  . nystatin  5 mL Oral QID  . ondansetron  4 mg Intravenous Once  . pantoprazole  40 mg Oral Daily  . pneumococcal 23 valent vaccine  0.5 mL Intramuscular Tomorrow-1000  . triamterene-hydrochlorothiazide  1 tablet Oral Daily    Recommendations: 1. Continue doxycycline 2. Discontinue ciprofloxacin 3. Await blood culture results   Assessment: Her illness is compatible with tick fever. She had [facility] spotted fever and it is too early for her to have developed a rash. Erlichiosis and Anaplasmosis are also possibilities. I agree with doxycycline therapy while awaiting results of blood cultures. She does not have any symptoms to suggest urinary tract infection and has only 3-6 white blood cells in her urine. I would not treat empirically with ciprofloxacin at this time. I will follow with you.    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 This went on for the next 24 hours despite ibuprofen leading her to go to the [facility] emergency department on July 6. She was given IV fluids and told she probably had a viral illness before being discharged home. She continued to have fevers and developed right-sided abdominal pain and returned to the emergency department yesterday.  An ultrasound of her right upper quadrant was unremarkable. She was noted to have leukopenia and mild thrombocytopenia and mild elevation of her liver enzymes.  She has some headache when her fever is elevated. She has a very mild dry cough that is somewhat chronic. She's had some recent nausea with this illness but no vomiting. She's not had any diarrhea or dysuria.  She is in her garden frequently and she has 2 large [place] retrievers. She has pulled some ticks off of her dogs and off of her husband but does not know of any tick bites herself.   Review of Systems: Pertinent items are noted in HPI.  Past Medical History  Diagnosis Date  . Hypertension   . PONV (postoperative nausea and vomiting)     History  Substance Use Topics  . Smoking status: Never Smoker   . Smokeless tobacco: Never Used  . Alcohol Use: No    History reviewed. No pertinent family history. Allergies  Allergen Reactions  . Thorazine (Chlorpromazine) Anaphylaxis  . Ace Inhibitors Other (See Comments)    Caused Angioedema.  . Compazine (Prochlorperazine Edisylate) Other (See Comments)    Becomes anxious.    OBJECTIVE: Blood pressure 126/55, pulse 73, temperature 99.7 F (37.6 C), temperature source Tympanic, resp. rate 18, height 5\' 8"  (1.727 m), weight 90.179 kg (198 lb 12.9 oz), SpO2 97.00%. General: She is alert  and conversant but looks uncomfortable due to malaise and nausea Neck: Supple Skin: No rash, splinter or conjunctival hemorrhages Lungs: Clear Cor: Regular S1 and S2 no murmurs Abdomen: Soft and nontender Joints and extremities: Normal  Microbiology: No results found for this or any previous visit (from the past 240 hour(s)).  Cliffton Asters, MD [facility] for Infectious Disease [facility] [phone] pager   [phone] cell 04/01/2012, 2:17 PM

## 2012-04-01 NOTE — ED Notes (Signed)
Pt presented to ED with right sided abdominal pain, given pain meds, pain level has reduced.

## 2012-04-01 NOTE — Progress Notes (Signed)
Observation review is complete. 

## 2012-04-01 NOTE — ED Notes (Signed)
Pt presented to ED with rt side abdominal pain.

## 2012-04-01 NOTE — ED Provider Notes (Addendum)
History     CSN: [id number]  Arrival date & time 04/01/12  0205   First MD Initiated Contact with Patient 04/01/12 [phone]      Chief Complaint  Patient presents with  . Fever  . Abdominal Pain    (Consider location/radiation/quality/duration/timing/severity/associated sxs/prior treatment) HPI 66 year old female presents to emergency department complaining of persistent fever, chills, myalgias, and abdominal pain. Patient reports onset of symptoms on Friday. Symptoms persisted through the weekend.  Pt is taking motrin for fever, no tylenol used.  Patient was seen in the ER on Saturday, diagnosed with viral infection and given fluids for dehydration. Patient reports she felt somewhat better after this treatment, but woke this morning with right upper abdominal pain and nausea without vomiting. Patient still has her gallbladder, has had her appendix removed. No prior history of gallstones. No known sick contacts, no travel, no known tick bites, but she does report that she is in an area that has known tick, has 2 dogs and a husband that she has pulled ticks off of recently.  Past Medical History  Diagnosis Date  . Hypertension     Past Surgical History  Procedure Date  . Appendectomy     History reviewed. No pertinent family history.  History  Substance Use Topics  . Smoking status: Never Smoker   . Smokeless tobacco: Not on file  . Alcohol Use: No    OB History    Grav Para Term Preterm Abortions TAB SAB Ect Mult Living                  Review of Systems  All other systems reviewed and are negative.    Allergies  Thorazine; Ace inhibitors; and Compazine  Home Medications   Current Outpatient Rx  Name Route Sig Dispense Refill  . ACETAMINOPHEN 325 MG PO TABS Oral Take 650 mg by mouth every 6 (six) hours as needed. Patient used this medication for aches and pains.    Marland Kitchen ESOMEPRAZOLE MAGNESIUM 40 MG PO CPDR Oral Take 40 mg by mouth every other day. Patient uses this  medication for acid reflux.    . IBUPROFEN 200 MG PO TABS Oral Take 400 mg by mouth every 6 (six) hours as needed. Patient used this medication for fever.    Marland Kitchen METOPROLOL TARTRATE 50 MG PO TABS Oral Take 50 mg by mouth 2 (two) times daily. Patient took this medication at 9 am this morning.    . TRIAMTERENE-HCTZ 75-50 MG PO TABS Oral Take 1 tablet by mouth daily.      BP 111/57  Pulse 72  Temp 99.1 F (37.3 C) (Oral)  Resp 12  SpO2 92%  Physical Exam  Nursing note and vitals reviewed. Constitutional: She is oriented to person, place, and time. She appears well-developed and well-nourished. She appears distressed (Uncomfortable appearing).  HENT:  Head: Normocephalic and atraumatic.  Nose: Nose normal.  Mouth/Throat: Oropharynx is clear and moist.  Eyes: Conjunctivae and EOM are normal. Pupils are equal, round, and reactive to light.  Neck: Normal range of motion. Neck supple. No JVD present. No tracheal deviation present. No thyromegaly present.  Cardiovascular: Normal rate, regular rhythm, normal heart sounds and intact distal pulses.  Exam reveals no gallop and no friction rub.   No murmur heard. Pulmonary/Chest: Effort normal and breath sounds normal. No stridor. No respiratory distress. She has no wheezes. She has no rales. She exhibits no tenderness.  Abdominal: Soft. Bowel sounds are normal. She exhibits no distension and  no mass. There is tenderness (tenderness in right upper quadrant and right mid abdomen without rebound or guarding). There is no rebound and no guarding.  Musculoskeletal: Normal range of motion. She exhibits no edema and no tenderness.  Lymphadenopathy:    She has no cervical adenopathy.  Neurological: She is oriented to person, place, and time. She exhibits normal muscle tone. Coordination normal.  Skin: Skin is dry. No rash noted. No erythema. No pallor.       Patient with multiple small angioma-like lesions noted across the abdomen. Patient reports this are  chronic. Small 1 cm bruise noted to right abdomen  Psychiatric: She has a normal mood and affect. Her behavior is normal. Judgment and thought content normal.    ED Course  Procedures (including critical care time)  Labs Reviewed  URINALYSIS, ROUTINE W REFLEX MICROSCOPIC - Abnormal; Notable for the following:    Color, Urine AMBER (*)  BIOCHEMICALS MAY BE AFFECTED BY COLOR   APPearance CLOUDY (*)     Bilirubin Urine SMALL (*)     Ketones, ur 15 (*)     Protein, ur 30 (*)     Urobilinogen, UA 2.0 (*)     Leukocytes, UA MODERATE (*)     All other components within normal limits  CBC WITH DIFFERENTIAL - Abnormal; Notable for the following:    WBC 2.4 (*)     Platelets 138 (*)     Neutrophils Relative 81 (*)     Lymphs Abs 0.4 (*)     All other components within normal limits  COMPREHENSIVE METABOLIC PANEL - Abnormal; Notable for the following:    Glucose, Bld 105 (*)     Albumin 3.2 (*)     AST 158 (*)     ALT 121 (*)     Alkaline Phosphatase 221 (*)     Total Bilirubin 1.4 (*)     GFR calc non Af Amer 55 (*)     GFR calc Af Amer 64 (*)     All other components within normal limits  URINE MICROSCOPIC-ADD ON - Abnormal; Notable for the following:    Squamous Epithelial / LPF MANY (*)     Bacteria, UA MANY (*)     Casts GRANULAR CAST (*)  HYALINE CASTS   All other components within normal limits  POCT I-STAT TROPONIN I   US Abdomen Complete  04/01/2012  *RADIOLOGY REPORT*  Clinical Data:  Abdominal pain and fever.  Elevated LFTs.  ABDOMINAL ULTRASOUND COMPLETE  Comparison:  Abdominal ultrasound performed 04/05/2011, and CT of the abdomen and pelvis performed 07/17/2006  Findings:  Gallbladder:  The gallbladder is normal in appearance, without evidence for gallstones, gallbladder wall thickening or pericholecystic fluid.  No ultrasonographic Murphy's sign is elicited.  Common Bile Duct:  0.3 cm in diameter; within normal limits in caliber. The common hepatic duct is difficult to  characterize due to overlying bowel gas.  Liver: A small focus of increased echogenicity within the liver, measuring approximately 3.2 x 2.9 x 2.8 cm, likely reflects focal fatty infiltration on correlation with prior studies; no suspicious focal lesions are identified.  More mild diffuse fatty infiltration is seen, given mildly coarsened parenchymal echotexture.  Limited Doppler evaluation demonstrates normal blood flow within the liver.  IVC:  Unremarkable in appearance.  Pancreas:  Although the pancreas is difficult to visualize due to overlying bowel gas, no focal pancreatic abnormality is identified.  Spleen:  10.9 cm in length; within normal limits in size  and echotexture.  Right kidney:  10.3 cm in length; normal in size, configuration and parenchymal echogenicity.  No evidence of mass or hydronephrosis.  Left kidney:  11.7 cm in length; normal in size, configuration and parenchymal echogenicity.  No evidence of mass or hydronephrosis.  Abdominal Aorta:  Normal in caliber; no aneurysm identified.  IMPRESSION:  1.  No acute abnormalities seen in the abdomen. 2.  Mild diffuse fatty infiltration within the liver, with more focal fatty infiltration noted in the central right hepatic lobe, as characterized on prior studies.  Original Report Authenticated By: Tonia Ghent, M.D.    Date: 04/01/2012  Rate: 84  Rhythm: normal sinus rhythm  QRS Axis: left  Intervals: normal  ST/T Wave abnormalities: normal  Conduction Disutrbances:left bundle branch block  Narrative Interpretation:   Old EKG Reviewed: unchanged   1. Fever   2. Abdominal pain, acute   3. Leukopenia   4. Thrombocytopenia   5. Transaminitis       MDM  66 year old female presents to the emergency department with persistent fevers myalgias and now with abdominal pain. Initial thought was potential acute cholecystitis, however ultrasound does not reflect this. Patient with multiple lab abnormalities compared to her previous ER visit 2  days ago, now with transaminitis, thrombocytopenia and leukopenia. Although patient denies a tick bite, these labs to me seem potentially do to a tick borne disease such as ehrlichiosis. Given her progressive symptoms and worsening lab values, feel patient better served with admission for further workup of her progressive disease. Discussed with hospitalist, who will see and admit.       Olivia Mackie, MD 04/01/12 1610  Olivia Mackie, MD 04/01/12 [phone]

## 2012-04-01 NOTE — Progress Notes (Signed)
UR complete 

## 2012-04-01 NOTE — H&P (Signed)
PCP:   Pearla Dubonnet, MD   Chief Complaint:  Fever, chills, myalgia, abdominal pain.  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 Patient was quite well, till 03/29/12, when she developed fever up to 101-101.9 degrees Farenheit, chills and generalized pains, sore throat and nausea. She utilized Motrin, to no avail. She came to the ED on 03/30/12, was treated for a possible viral syndrome, with iv fluids, Tylenol, and recommended rest. This morning, she awoke with right upper abdominal pain, and nausea, and returned to the ED. Denies vomiting or diarrhea. No known sick contacts, no travel, no known tick bites, but she resides in an area that has known ticks, near [place], has 2 dogs and a husband that she has pulled ticks off of, recently.   Allergies:   Allergies  Allergen Reactions  . Thorazine (Chlorpromazine) Anaphylaxis  . Ace Inhibitors Other (See Comments)    Caused Angioedema.  . Compazine (Prochlorperazine Edisylate) Other (See Comments)    Becomes anxious.      Past Medical History  Diagnosis Date  . Hypertension     Past Surgical History  Procedure Date  . Appendectomy     Prior to Admission medications   Medication Sig Start Date End Date Taking? Authorizing Provider  acetaminophen (TYLENOL) 325 MG tablet Take 650 mg by mouth every 6 (six) hours as needed. Patient used this medication for aches and pains.   Yes Historical Provider, MD  esomeprazole (NEXIUM) 40 MG capsule Take 40 mg by mouth every other day. Patient uses this medication for acid reflux.   Yes Historical Provider, MD  ibuprofen (ADVIL,MOTRIN) 200 MG tablet Take 400 mg by mouth every 6 (six) hours as needed. Patient used this  medication for fever.   Yes Historical Provider, MD  metoprolol (LOPRESSOR) 50 MG tablet Take 50 mg by mouth 2 (two) times daily. Patient took this medication at 9 am this morning.   Yes Historical Provider, MD  triamterene-hydrochlorothiazide (MAXZIDE) 75-50 MG per tablet Take 1 tablet by mouth daily.   Yes Historical Provider, MD    Social History: Patient reports that she has never smoked. She does not have any smokeless tobacco history on file. She reports that she does not drink alcohol or use illicit drugs. Has one daughter. She works in the [employer] system in [employer].  Family History: Father had CAD, s/p CABG, died at age 37 years. Mother had cardiomyopathy, passed away at age 59 years.   Review of Systems:  As per HPI and chief complaint. Patent denies fatigue, diminished appetite, weight loss, headache, blurred vision, difficulty in speaking, dysphagia, chest pain, cough, shortness of breath, orthopnea, paroxysmal nocturnal dyspnea, nausea, diaphoresis, abdominal pain, vomiting, diarrhea, belching, heartburn, hematemesis, melena, dysuria, nocturia, urinary frequency, hematochezia, lower extremity swelling, pain, or redness. She is occasionally constipated. The rest of the systems review is negative.  Physical Exam:  General:  Patient does not appear to be in obvious acute distress. Alert, communicative, anxious-looking, fully oriented, talking in complete sentences, not short of breath at rest.  HEENT:  No clinical pallor, no jaundice, no conjunctival injection or discharge. Hydration status is fair. Has oral thrush.  NECK:  Supple, JVP not seen, no carotid bruits, no palpable lymphadenopathy, no palpable goiter. CHEST:  Clinically clear to auscultation, no wheezes, no crackles. HEART:  Sounds 1 and 2 heard, normal, regular, no murmurs. ABDOMEN:  Moderately obese, soft, non-tender, mild RUQ discomfort, no guarding or rebound, no palpable organomegaly, no palpable masses,  normal bowel sounds. GENITALIA:  Not examined. LOWER EXTREMITIES:  No pitting edema, palpable peripheral pulses. MUSCULOSKELETAL SYSTEM:  Unremarkable. CENTRAL NERVOUS SYSTEM:  No focal neurologic deficit on gross examination.  Labs on Admission:  Results for orders placed during the hospital encounter of 04/01/12 (from the past 48 hour(s))  CBC WITH DIFFERENTIAL     Status: Abnormal   Collection Time   04/01/12  2:18 AM      Component Value Range Comment   WBC 2.4 (*) 4.0 - 10.5 K/uL    RBC 4.57  3.87 - 5.11 MIL/uL    Hemoglobin 13.1  12.0 - 15.0 g/dL    HCT 16.1  09.6 - 04.5 %    MCV 83.6  78.0 - 100.0 fL    MCH 28.7  26.0 - 34.0 pg    MCHC 34.3  30.0 - 36.0 g/dL    RDW 40.9  81.1 - 91.4 %    Platelets 138 (*) 150 - 400 K/uL    Neutrophils Relative 81 (*) 43 - 77 %    Neutro Abs 1.9  1.7 - 7.7 K/uL    Lymphocytes Relative 15  12 - 46 %    Lymphs Abs 0.4 (*) 0.7 - 4.0 K/uL    Monocytes Relative 3  3 - 12 %    Monocytes Absolute 0.1  0.1 - 1.0 K/uL    Eosinophils Relative 0  0 - 5 %    Eosinophils Absolute 0.0  0.0 - 0.7 K/uL    Basophils Relative 0  0 - 1 %    Basophils Absolute 0.0  0.0 - 0.1 K/uL   COMPREHENSIVE METABOLIC PANEL     Status: Abnormal   Collection Time   04/01/12  2:18 AM      Component Value Range Comment   Sodium 137  135 - 145 mEq/L    Potassium 3.5  3.5 - 5.1 mEq/L    Chloride 102  96 - 112 mEq/L    CO2 25  19 - 32 mEq/L    Glucose, Bld 105 (*) 70 - 99 mg/dL    BUN 12  6 - 23 mg/dL    Creatinine, Ser 7.82  0.50 - 1.10 mg/dL    Calcium 8.7  8.4 - 95.6 mg/dL    Total Protein 6.8  6.0 - 8.3 g/dL    Albumin 3.2 (*) 3.5 - 5.2 g/dL    AST 213 (*) 0 - 37 U/L    ALT 121 (*) 0 - 35 U/L    Alkaline Phosphatase 221 (*) 39 - 117 U/L    Total Bilirubin 1.4 (*) 0.3 - 1.2 mg/dL    GFR calc non Af Amer 55 (*) >90 mL/min    GFR calc Af Amer 64 (*) >90 mL/min   POCT I-STAT TROPONIN I     Status: Normal   Collection Time   04/01/12  2:30 AM      Component Value Range  Comment   Troponin i, poc 0.00  0.00 - 0.08 ng/mL    Comment 3            URINALYSIS, ROUTINE W REFLEX MICROSCOPIC     Status: Abnormal   Collection Time   04/01/12  4:33 AM  Component Value Range Comment   Color, Urine AMBER (*) YELLOW BIOCHEMICALS MAY BE AFFECTED BY COLOR   APPearance CLOUDY (*) CLEAR    Specific Gravity, Urine 1.019  1.005 - 1.030    pH 6.0  5.0 - 8.0    Glucose, UA NEGATIVE  NEGATIVE mg/dL    Hgb urine dipstick NEGATIVE  NEGATIVE    Bilirubin Urine SMALL (*) NEGATIVE    Ketones, ur 15 (*) NEGATIVE mg/dL    Protein, ur 30 (*) NEGATIVE mg/dL    Urobilinogen, UA 2.0 (*) 0.0 - 1.0 mg/dL    Nitrite NEGATIVE  NEGATIVE    Leukocytes, UA MODERATE (*) NEGATIVE   URINE MICROSCOPIC-ADD ON     Status: Abnormal   Collection Time   04/01/12  4:33 AM      Component Value Range Comment   Squamous Epithelial / LPF MANY (*) RARE    WBC, UA 3-6  <3 WBC/hpf    RBC / HPF 0-2  <3 RBC/hpf    Bacteria, UA MANY (*) RARE    Casts GRANULAR CAST (*) NEGATIVE HYALINE CASTS    Radiological Exams on Admission: *RADIOLOGY REPORT*  Clinical Data: Abdominal pain and fever. Elevated LFTs.  ABDOMINAL ULTRASOUND COMPLETE  Comparison: Abdominal ultrasound performed 04/05/2011, and CT of the abdomen and pelvis performed 07/17/2006  Findings:  Gallbladder: The gallbladder is normal in appearance, without evidence for gallstones, gallbladder wall thickening or pericholecystic fluid. No ultrasonographic Murphy's sign is elicited.  Common Bile Duct: 0.3 cm in diameter; within normal limits in caliber. The common hepatic duct is difficult to characterize due to overlying bowel gas.  Liver: A small focus of increased echogenicity within the liver, measuring approximately 3.2 x 2.9 x 2.8 cm, likely reflects focal fatty infiltration on correlation with prior studies; no suspicious focal lesions are identified. More mild diffuse fatty infiltration is seen, given mildly coarsened  parenchymal echotexture. Limited Doppler evaluation demonstrates normal blood flow within the liver.  IVC: Unremarkable in appearance.  Pancreas: Although the pancreas is difficult to visualize due to overlying bowel gas, no focal pancreatic abnormality is identified.  Spleen: 10.9 cm in length; within normal limits in size and echotexture.  Right kidney: 10.3 cm in length; normal in size, configuration and parenchymal echogenicity. No evidence of mass or hydronephrosis.  Left kidney: 11.7 cm in length; normal in size, configuration and parenchymal echogenicity. No evidence of mass or hydronephrosis.  Abdominal Aorta: Normal in caliber; no aneurysm identified.  IMPRESSION:  1. No acute abnormalities seen in the abdomen. 2. Mild diffuse fatty infiltration within the liver, with more focal fatty infiltration noted in the central right hepatic lobe, as characterized on prior studies.  Original Report Authenticated By: Tonia Ghent, M.D.   Assessment/Plan Active Problems: 1. Febrile illness: Patient presents with a 4-day history of a "flu-like" illness, associated with upper abdominal discomfort, fever, myalgias, acutely developing leukopenia/thrombocytopenia (CBC was normal on 03/30/12), as well as exposure to ticks. These findings are consistent with tick-borne illness, and differential-diagnostic considerations include RMSF, Erlichiosis/Anaplasmosis. We shall adit patient , manage with supportive treatment, ie, rest, hydration, and commence Doxycycline. We shall also, send off appropriate serologies. ID consultation will be requested, to assist in management. Patient does not appear to have risk factors for HIV.  2. Transaminitis: This likely to be part of above disease complex, but abdominal ultrasound, demonstrates fatty infiltration of the liver, suggesting at least base-line mildly abnormal LFTs, which has now acutely worsened. For completeness, we shall arrange acute viral  hepatitis serologies.  3. UTI (lower urinary tract infection): Patient has a positive urinary sediment, with bacteriuria. This may be a contaminant, as there is no pyuria, and urinalysis was normal on 03/30/12 and she has no dysuria. However, she does have a known history of recurrent UTIs, so we shall send off culture, and empirically treat with Ciprofloxacin. 4. Leukopenia/Thrombocytopenia: See discussion in #1 above. Following CBC 5. HTN (hypertension): Controlled. We shall continue pre-admission medication, and observe.  6. GERD (gastroesophageal reflux disease): Continue PPI.  7. Oral thrush: This is an incidental finding on physical examination. We shall manage with Nystatin oral suspension.    Time Spent on Admission: 1 hour.  ,CHRISTOPHER 04/01/2012, 8:29 AM

## 2012-04-01 NOTE — ED Notes (Signed)
Patient complaining of fever, chills, and body aches since Friday.  Patient was seen at [facility] on Friday and was diagnosed with the flu; received fluids.  Patient states that the symptoms continued through Sunday and she woke up with right epigastric pain and nausea tonight; denies vomiting.  Patient denies shortness of breath and chest pain.

## 2012-04-01 NOTE — ED Notes (Signed)
Warm blanket given

## 2012-04-02 ENCOUNTER — Inpatient Hospital Stay (HOSPITAL_COMMUNITY): Payer: 59

## 2012-04-02 DIAGNOSIS — R509 Fever, unspecified: Secondary | ICD-10-CM

## 2012-04-02 LAB — CBC
MCH: 28.1 pg (ref 26.0–34.0)
MCHC: 33.5 g/dL (ref 30.0–36.0)
MCV: 83.8 fL (ref 78.0–100.0)
Platelets: 124 10*3/uL — ABNORMAL LOW (ref 150–400)
RDW: 13.2 % (ref 11.5–15.5)
WBC: 2 10*3/uL — ABNORMAL LOW (ref 4.0–10.5)

## 2012-04-02 LAB — COMPREHENSIVE METABOLIC PANEL
ALT: 118 U/L — ABNORMAL HIGH (ref 0–35)
Albumin: 2.7 g/dL — ABNORMAL LOW (ref 3.5–5.2)
Alkaline Phosphatase: 219 U/L — ABNORMAL HIGH (ref 39–117)
BUN: 12 mg/dL (ref 6–23)
Chloride: 100 mEq/L (ref 96–112)
Glucose, Bld: 117 mg/dL — ABNORMAL HIGH (ref 70–99)
Potassium: 3.6 mEq/L (ref 3.5–5.1)
Sodium: 135 mEq/L (ref 135–145)
Total Bilirubin: 1.6 mg/dL — ABNORMAL HIGH (ref 0.3–1.2)

## 2012-04-02 LAB — URINE CULTURE
Colony Count: NO GROWTH
Culture: NO GROWTH

## 2012-04-02 LAB — ROCKY MTN SPOTTED FVR AB, IGM-BLOOD: RMSF IgM: 0.15 IV (ref 0.00–0.89)

## 2012-04-02 LAB — HEPATITIS PANEL, ACUTE: Hepatitis B Surface Ag: NEGATIVE

## 2012-04-02 LAB — EHRLICHIA ANTIBODY PANEL: E chaffeensis (HGE) Ab, IgG: NEGATIVE

## 2012-04-02 MED ORDER — SINCALIDE 5 MCG IJ SOLR
0.0200 ug/kg | Freq: Once | INTRAMUSCULAR | Status: AC
  Administered 2012-04-02: 5 ug via INTRAVENOUS
  Filled 2012-04-02: qty 5

## 2012-04-02 MED ORDER — TECHNETIUM TC 99M MEBROFENIN IV KIT
5.0000 | PACK | Freq: Once | INTRAVENOUS | Status: AC | PRN
  Administered 2012-04-02: 5 via INTRAVENOUS

## 2012-04-02 NOTE — Progress Notes (Signed)
Subjective: Amanda Lambert is feeling much better today but still having some right upper quadrant pain, though improved.  Unfortunately, she is having nausea and vomiting with the doxycycline  Objective: Weight change:   Intake/Output Summary (Last 24 hours) at 04/02/12 0726 Last data filed at 04/02/12 0556  Gross per 24 hour  Intake   1235 ml  Output      0 ml  Net   1235 ml   Filed Vitals:   04/01/12 1300 04/01/12 1821 04/01/12 2138 04/02/12 0535  BP: 126/55 117/56 122/59 127/55  Pulse: 73 72 77 67  Temp: 99.7 F (37.6 C) 100 F (37.8 C) 98.5 F (36.9 C) 98.4 F (36.9 C)  TempSrc: Oral Oral Oral Oral  Resp: 18 18 18 18   Height:      Weight:      SpO2: 97% 94% 98% 98%     General Appearance: Alert, cooperative, no distress, appears stated age Head: Normocephalic, without obvious abnormality, atraumatic Neck: Supple, symmetrical Lungs: Clear to auscultation bilaterally, respirations unlabored Heart: Regular rate and rhythm, S1 and S2 normal, no murmur, rub or gallop Abdomen: Soft, non-tender and right upper quadrant.  Feels discomfort laterally and under the right lower thoracic cage.  Pain is not pleuritic.  Lungs sound clear Extremities: Extremities normal, atraumatic, no cyanosis or edema Pulses: 2+ and symmetric all extremities Skin: Skin color, texture, turgor normal, no rashes or lesions Neuro: CNII-XII intact. Normal strength, sensation and reflexes throughout   Lab Results:  [facility] 04/01/12 0218 03/30/12 1940  NA 137 137  K 3.5 3.3*  CL 102 102  CO2 25 25  GLUCOSE 105* 126*  BUN 12 11  CREATININE 1.04 0.90  CALCIUM 8.7 9.0  MG -- --  PHOS -- --    Basename 04/01/12 0218 03/30/12 1940  AST 158* 94*  ALT 121* 65*  ALKPHOS 221* 112  BILITOT 1.4* 0.3  PROT 6.8 7.0  ALBUMIN 3.2* 3.4*   No results found for this basename: LIPASE:2,AMYLASE:2 in the last 72 hours  Basename 04/01/12 0218 03/30/12 1940  WBC 2.4* 3.6*  NEUTROABS 1.9 3.1  HGB 13.1 12.7   HCT 38.2 37.6  MCV 83.6 83.7  PLT 138* 210   No results found for this basename: CKTOTAL:3,CKMB:3,CKMBINDEX:3,TROPONINI:3 in the last 72 hours No components found with this basename: POCBNP:3 No results found for this basename: DDIMER:2 in the last 72 hours No results found for this basename: HGBA1C:2 in the last 72 hours No results found for this basename: CHOL:2,HDL:2,LDLCALC:2,TRIG:2,CHOLHDL:2,LDLDIRECT:2 in the last 72 hours No results found for this basename: TSH,T4TOTAL,FREET3,T3FREE,THYROIDAB in the last 72 hours No results found for this basename: VITAMINB12:2,FOLATE:2,FERRITIN:2,TIBC:2,IRON:2,RETICCTPCT:2 in the last 72 hours  Studies/Results: US Abdomen Complete  04/01/2012  *RADIOLOGY REPORT*  Clinical Data:  Abdominal pain and fever.  Elevated LFTs.  ABDOMINAL ULTRASOUND COMPLETE  Comparison:  Abdominal ultrasound performed 04/05/2011, and CT of the abdomen and pelvis performed 07/17/2006  Findings:  Gallbladder:  The gallbladder is normal in appearance, without evidence for gallstones, gallbladder wall thickening or pericholecystic fluid.  No ultrasonographic Murphy's sign is elicited.  Common Bile Duct:  0.3 cm in diameter; within normal limits in caliber. The common hepatic duct is difficult to characterize due to overlying bowel gas.  Liver: A small focus of increased echogenicity within the liver, measuring approximately 3.2 x 2.9 x 2.8 cm, likely reflects focal fatty infiltration on correlation with prior studies; no suspicious focal lesions are identified.  More mild diffuse fatty infiltration is seen, given  mildly coarsened parenchymal echotexture.  Limited Doppler evaluation demonstrates normal blood flow within the liver.  IVC:  Unremarkable in appearance.  Pancreas:  Although the pancreas is difficult to visualize due to overlying bowel gas, no focal pancreatic abnormality is identified.  Spleen:  10.9 cm in length; within normal limits in size and echotexture.  Right kidney:   10.3 cm in length; normal in size, configuration and parenchymal echogenicity.  No evidence of mass or hydronephrosis.  Left kidney:  11.7 cm in length; normal in size, configuration and parenchymal echogenicity.  No evidence of mass or hydronephrosis.  Abdominal Aorta:  Normal in caliber; no aneurysm identified.  IMPRESSION:  1.  No acute abnormalities seen in the abdomen. 2.  Mild diffuse fatty infiltration within the liver, with more focal fatty infiltration noted in the central right hepatic lobe, as characterized on prior studies.  Original Report Authenticated By: Tonia Ghent, M.D.   Medications: Scheduled Meds:   . docusate sodium  100 mg Oral BID  . doxycycline  100 mg Oral Q12H  . metoprolol  50 mg Oral BID  . nystatin  5 mL Oral QID  . pantoprazole  40 mg Oral Daily  . pneumococcal 23 valent vaccine  0.5 mL Intramuscular Tomorrow-1000  . triamterene-hydrochlorothiazide  1 tablet Oral Daily  . DISCONTD: ciprofloxacin  500 mg Oral BID   Continuous Infusions:   . sodium chloride 75 mL/hr at 04/01/12 2258   PRN Meds:.acetaminophen, acetaminophen, morphine injection, ondansetron (ZOFRAN) IV, ondansetron, oxyCODONE  Assessment/Plan: Patient Active Problem List   Diagnosis Date Noted  . Fever - has improved on doxycycline and initial dose of Cipro - concerned with tickborne illness but also need to consider cholecystitis/cholangitis with elevated alkaline phosphatase and increasing LFTs - will check HIDA scan Chest pain - nonpleuritic.  Could be secondary to liver capsule swelling.  Check PA and lateral chest x-ray to make sure lungs are clear  04/01/2012  . Transaminitis - slightly worse today but patient is symptomatically better  04/01/2012  . UTI (lower urinary tract infection) - doubt she had a significant UTI  04/01/2012  . Leukopenia - persisting  04/01/2012  . Thrombocytopenia - improved  04/01/2012  . HTN (hypertension) - controlled  04/01/2012  . GERD- continue PPI  therapy  04/01/2012  . Oral thrush - HIV negative, continue current therapy Nausea and vomiting - is temporally related to taking doxycycline - defer to infectious disease for antibiotic therapy  04/01/2012     LOS: 1 day   , NEVILL 04/02/2012, 7:26 AM

## 2012-04-02 NOTE — Progress Notes (Signed)
Patient ID: Amanda Lambert, female   DOB: [birth year], 66 y.o.   MRN: [id number]    [facility]    Date of Admission:  04/01/2012           Day 2 doxycycline Active Problems:  Fever  Transaminitis  UTI (lower urinary tract infection)  Leukopenia  Thrombocytopenia  HTN (hypertension)  GERD (gastroesophageal reflux disease)  Oral thrush      . docusate sodium  100 mg Oral BID  . doxycycline  100 mg Oral Q12H  . metoprolol  50 mg Oral BID  . nystatin  5 mL Oral QID  . pantoprazole  40 mg Oral Daily  . pneumococcal 23 valent vaccine  0.5 mL Intramuscular Tomorrow-1000  . sincalide  0.02 mcg/kg Intravenous Once  . DISCONTD: triamterene-hydrochlorothiazide  1 tablet Oral Daily    Subjective: Amanda Lambert is feeling much better today. She has not had any more fever, chills or headache. She had one episode of nausea and vomiting several hours after taking her doxycycline last night. Her morning dose was not given to her because she was n.p.o. for her HIDA scan.  Objective: Temp:  [98.4 F (36.9 C)-100 F (37.8 C)] 98.4 F (36.9 C) (07/09 0535) Pulse Rate:  [67-77] 67  (07/09 0535) Resp:  [18] 18  (07/09 0535) BP: (117-127)/(55-59) 127/55 mmHg (07/09 0535) SpO2:  [94 %-98 %] 98 % (07/09 0535)  General: She looks much better today; she is smiling and in good spirits Skin: No rash Lungs: Clear Cor: Regular S1 and S2 no murmurs Abdomen: Soft with mild right upper quadrant tenderness  Lab Results Lab Results  Component Value Date   WBC 2.0* 04/02/2012   HGB 11.8* 04/02/2012   HCT 35.2* 04/02/2012   MCV 83.8 04/02/2012   PLT 124* 04/02/2012    Lab Results  Component Value Date   CREATININE 0.93 04/02/2012   BUN 12 04/02/2012   NA 135 04/02/2012   K 3.6 04/02/2012   CL 100 04/02/2012   CO2 25 04/02/2012    Lab Results  Component Value Date   ALT 118* 04/02/2012   AST 155* 04/02/2012   ALKPHOS 219* 04/02/2012   BILITOT 1.6* 04/02/2012      Microbiology: Recent Results  (from the past 240 hour(s))  URINE CULTURE     Status: Normal   Collection Time   04/01/12  4:33 AM      Component Value Range Status Comment   Specimen Description URINE, CLEAN CATCH   Final    Special Requests ADDED AT 1220   Final    Culture  Setup Time 04/01/2012 12:30   Final    Colony Count NO GROWTH   Final    Culture NO GROWTH   Final    Report Status 04/02/2012 FINAL   Final   CULTURE, BLOOD (ROUTINE X 2)     Status: Normal (Preliminary result)   Collection Time   04/01/12  9:23 AM      Component Value Range Status Comment   Specimen Description BLOOD RIGHT HAND   Final    Special Requests BOTTLES DRAWN AEROBIC AND ANAEROBIC 10CC   Final    Culture  Setup Time 04/01/2012 13:08   Final    Culture     Final    Value:        BLOOD CULTURE RECEIVED NO GROWTH TO DATE CULTURE WILL BE HELD FOR 5 DAYS BEFORE ISSUING A FINAL NEGATIVE REPORT   Report Status  PENDING   Incomplete   CULTURE, BLOOD (ROUTINE X 2)     Status: Normal (Preliminary result)   Collection Time   04/01/12  9:33 AM      Component Value Range Status Comment   Specimen Description BLOOD RIGHT ANTECUBITAL   Final    Special Requests BOTTLES DRAWN AEROBIC ONLY 5CC   Final    Culture  Setup Time 04/01/2012 13:08   Final    Culture     Final    Value:        BLOOD CULTURE RECEIVED NO GROWTH TO DATE CULTURE WILL BE HELD FOR 5 DAYS BEFORE ISSUING A FINAL NEGATIVE REPORT   Report Status PENDING   Incomplete     Studies/Results: Dg Chest 2 View  04/02/2012  *RADIOLOGY REPORT*  Clinical Data: Right-sided chest pain.  Fever.  CHEST - 2 VIEW  Comparison: None.  Findings: Small bilateral pleural effusions are present. Cardiopericardial silhouette appears within normal limits.  Mild basilar atelectasis is present in the lungs.  Mild bilateral pleural apical scarring/thickening.  Trachea midline.  There is mild prominence of pulmonary hila.  There does appear to be some cephalization of pulmonary blood flow, suggesting volume overload.   No airspace disease or consolidation is identified. Linear subsegmental atelectasis is present over the right atrium on the lateral view.  IMPRESSION: Small bilateral pleural effusions with some cephalization of pulmonary blood flow suggesting volume overload and mild CHF. Consider repeat PA and lateral chest after acute illness to reassess the pulmonary hila.  Negative for pneumonia.  Original Report Authenticated By: Andreas Newport, M.D.   US Abdomen Complete  04/01/2012  *RADIOLOGY REPORT*  Clinical Data:  Abdominal pain and fever.  Elevated LFTs.  ABDOMINAL ULTRASOUND COMPLETE  Comparison:  Abdominal ultrasound performed 04/05/2011, and CT of the abdomen and pelvis performed 07/17/2006  Findings:  Gallbladder:  The gallbladder is normal in appearance, without evidence for gallstones, gallbladder wall thickening or pericholecystic fluid.  No ultrasonographic Murphy's sign is elicited.  Common Bile Duct:  0.3 cm in diameter; within normal limits in caliber. The common hepatic duct is difficult to characterize due to overlying bowel gas.  Liver: A small focus of increased echogenicity within the liver, measuring approximately 3.2 x 2.9 x 2.8 cm, likely reflects focal fatty infiltration on correlation with prior studies; no suspicious focal lesions are identified.  More mild diffuse fatty infiltration is seen, given mildly coarsened parenchymal echotexture.  Limited Doppler evaluation demonstrates normal blood flow within the liver.  IVC:  Unremarkable in appearance.  Pancreas:  Although the pancreas is difficult to visualize due to overlying bowel gas, no focal pancreatic abnormality is identified.  Spleen:  10.9 cm in length; within normal limits in size and echotexture.  Right kidney:  10.3 cm in length; normal in size, configuration and parenchymal echogenicity.  No evidence of mass or hydronephrosis.  Left kidney:  11.7 cm in length; normal in size, configuration and parenchymal echogenicity.  No evidence of  mass or hydronephrosis.  Abdominal Aorta:  Normal in caliber; no aneurysm identified.  IMPRESSION:  1.  No acute abnormalities seen in the abdomen. 2.  Mild diffuse fatty infiltration within the liver, with more focal fatty infiltration noted in the central right hepatic lobe, as characterized on prior studies.  Original Report Authenticated By: Tonia Ghent, M.D.    Assessment: Amanda Lambert is improving with empiric therapy for [facility] spotted fever. Liver enzyme elevation is fairly common with [facility] spotted fever but patient usually  do not have significant right upper quadrant pain. I've asked her nurse to give her another dose of oral doxycycline. If she has problems tolerating it then I will change her to IV doxycycline.  Plan: 1. Continue doxycycline 2. Check HIDA scan results  Cliffton Asters, MD [facility] [phone] pager   [phone] cell 04/02/2012, 2:41 PM

## 2012-04-03 DIAGNOSIS — K828 Other specified diseases of gallbladder: Secondary | ICD-10-CM

## 2012-04-03 LAB — CBC WITH DIFFERENTIAL/PLATELET
Basophils Relative: 2 % — ABNORMAL HIGH (ref 0–1)
HCT: 34.4 % — ABNORMAL LOW (ref 36.0–46.0)
Hemoglobin: 11.3 g/dL — ABNORMAL LOW (ref 12.0–15.0)
Lymphocytes Relative: 36 % (ref 12–46)
Monocytes Absolute: 0.4 10*3/uL (ref 0.1–1.0)
Monocytes Relative: 13 % — ABNORMAL HIGH (ref 3–12)
Neutro Abs: 1.8 10*3/uL (ref 1.7–7.7)
Neutrophils Relative %: 50 % (ref 43–77)
RBC: 4.11 MIL/uL (ref 3.87–5.11)
WBC: 3.5 10*3/uL — ABNORMAL LOW (ref 4.0–10.5)

## 2012-04-03 LAB — HEPATIC FUNCTION PANEL
AST: 170 U/L — ABNORMAL HIGH (ref 0–37)
Bilirubin, Direct: 0.7 mg/dL — ABNORMAL HIGH (ref 0.0–0.3)
Indirect Bilirubin: 0.6 mg/dL (ref 0.3–0.9)
Total Bilirubin: 1.3 mg/dL — ABNORMAL HIGH (ref 0.3–1.2)

## 2012-04-03 LAB — BASIC METABOLIC PANEL
Chloride: 100 mEq/L (ref 96–112)
GFR calc Af Amer: 70 mL/min — ABNORMAL LOW (ref >90)
GFR calc non Af Amer: 60 mL/min — ABNORMAL LOW (ref >90)
Potassium: 3.4 mEq/L — ABNORMAL LOW (ref 3.5–5.1)
Sodium: 135 mEq/L (ref 135–145)

## 2012-04-03 MED ORDER — DOXYCYCLINE HYCLATE 100 MG PO TABS: 100.0000 mg | ORAL_TABLET | Freq: Two times a day (BID) | ORAL | Status: DC

## 2012-04-03 MED ORDER — ONDANSETRON HCL 4 MG PO TABS: 8.0000 mg | ORAL_TABLET | Freq: Four times a day (QID) | ORAL | Status: AC | PRN

## 2012-04-03 NOTE — Progress Notes (Signed)
Patient discharged to home with instructions, accompanied by husband, no complaints.

## 2012-04-03 NOTE — Discharge Summary (Signed)
Physician Discharge Summary  NAME:Amanda Lambert  EAV:[id number]  DOB: 08/18/1946   Admit date: 04/01/2012 Discharge date: 04/03/2012  Discharge Diagnoses:  Active Problems:  Fever -  Resolving.  Quite possibly [facility] spotted fever.  Will followup with repeat antibody titers as outpatient and continue doxycycline for 10 days total  Transaminitis - low grade and persisting at discharge.  Repeat in the office in one week.  General surgery to consult today for gallbladder dysfunction though no evidence of cholecystitis.  Patient still with right upper quadrant tenderness though patent cystic duct and no obvious cholecystitis  UTI (lower urinary tract infection) - urine culture revealed no growth - no evidence of UTI this admission  Leukopenia - improving at discharge  Thrombocytopenia - improving at discharge  HTN (hypertension) - controlled  GERD (gastroesophageal reflux disease) - resume PPI therapy at discharge  Oral thrush - improved.  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  She had a hiatus scan in July 2012 that was within normal limits for right upper quadrant pain. She was admitted on 04/01/2012 after a febrile illness for several days.  On 03/29/2012 her temperature spiked to 101.9 and she had generalized achiness sore throat and nausea.  She was seen in the emergency room on 03/30/2012 and treated for a possible viral syndrome with IV fluids and Tylenol and rest was recommended.  On the morning of admission, 04/01/2012, she developed right upper quadrant pain nausea and returned to the emergency room.  Abdominal ultrasound was benign.  She had no history of tick bites though she had removed a lot of ticks from her dogs recently. [place] antibody testing for spirochete infection has  been negative but still likely that she has had [facility] spotted fever with headache fever and neutropenia thrombocytopenia and transaminitis.  She has been responding to doxycycline but it has been causing some nausea, she is able to tolerate it orally. She is to have the right upper quadrant tenderness I will be consulting general surgery prior to discharge which is planned later today to make sure that she does not need any further treatment immediately in terms of her gallbladder. HIDA scanning on 04/02/2012 showed a markedly low ejection fraction of the gallbladder and she had rather intense right upper quadrant pain with CCK infusion.  This was similar to the one quadrant pain she has been describing and similar to prior to quadrant pain she had last summer when her height scan was also found to be without cystic duct obstruction. Blood pressure has been controlled during this hospitalization and reflux symptoms have been controlled.  We'll plan for discharge early this afternoon as long as it is okay with general surgery and Dr. Cliffton Asters of infectious disease.  Discharge Physical Exam:  Alert and conversive.  Able to eat.  Just mild nausea with doxycycline but no vomiting.  No abdominal pain without palpation  Filed Vitals:   04/02/12 1446 04/02/12 2136 04/02/12 2141 04/03/12 0535  BP: 124/56 119/59 119/59 132/57  Pulse: 67 70 70 78  Temp: 99.1 F (37.3 C) 98.8 F (37.1 C)  99.5 F (37.5 C)  TempSrc: Oral Oral  Oral  Resp: 18 16  16   Height:      Weight:      SpO2: 99% 92%  95%    Head:  Normocephalic, without obvious abnormality, atraumatic  Neck: Supple, symmetrical  Lungs: Clear to auscultation bilaterally, respirations unlabored  Heart: Regular rate and rhythm, S1 and S2 normal, no murmur, rub or gallop  Abdomen: Soft, tender in right upper quadrant to palpation. Feels discomfort laterally and under the right lower thoracic cage. Pain is not pleuritic. Lungs sound  clear.  Extremities: Extremities normal, atraumatic, no cyanosis or edema  Pulses: 2+ and symmetric all extremities  Skin: Skin color, texture, turgor normal, no rashes or lesions  Neuro: CNII-XII intact. Normal strength, sensation and reflexes throughout  Consults: Infectious disease - Dr. Cliffton Asters                   General surgery - Pending this a.m. prior to discharge  Disposition: Home / Self Care  Discharge Orders    Future Orders Please Complete By Expires   Diet - low sodium heart healthy      Increase activity slowly      Call MD for:  temperature >100.4      Call MD for:  persistant nausea and vomiting      Call MD for:  severe uncontrolled pain      Call MD for:  difficulty breathing, headache or visual disturbances        Medication List  As of 04/03/2012  7:33 AM   STOP taking these medications         ibuprofen 200 MG tablet         TAKE these medications         acetaminophen 325 MG tablet   Commonly known as: TYLENOL   Take 650 mg by mouth every 6 (six) hours as needed. Patient used this medication for aches and pains.      doxycycline 100 MG tablet   Commonly known as: VIBRA-TABS   Take 1 tablet (100 mg total) by mouth every 12 (twelve) hours.      esomeprazole 40 MG capsule   Commonly known as: NEXIUM   Take 40 mg by mouth every other day. Patient uses this medication for acid reflux.      metoprolol 50 MG tablet   Commonly known as: LOPRESSOR   Take 50 mg by mouth 2 (two) times daily. Patient took this medication at 9 am this morning.      ondansetron 4 MG tablet   Commonly known as: ZOFRAN   Take 2 tablets (8 mg total) by mouth every 6 (six) hours as needed for nausea.      triamterene-hydrochlorothiazide 75-50 MG per tablet   Commonly known as: MAXZIDE   Take 1 tablet by mouth daily.             Things to follow up in the outpatient setting: Notify M.D. if the following occur: Recurrent nausea and vomiting, headache, fever, rash.   Followup in Dr. Kevan Ny office in one week  Time coordinating discharge: 40 minutes spent reviewing labs and x-ray results, notes from consultants, interviewing and examining the patient and providing discharge instructions and performing discharge summary   The results of significant diagnostics from this hospitalization (including imaging, microbiology, ancillary and laboratory) are listed below for reference.    Significant Diagnostic Studies: Dg Chest 2 View  04/02/2012  *RADIOLOGY REPORT*  Clinical Data: Right-sided chest pain.  Fever.  CHEST - 2 VIEW  Comparison: None.  Findings: Small bilateral pleural effusions are present. Cardiopericardial silhouette appears within normal limits.  Mild basilar atelectasis is present in the lungs.  Mild bilateral pleural apical scarring/thickening.  Trachea midline.  There is mild prominence of pulmonary hila.  There does appear to be some cephalization of pulmonary blood flow, suggesting volume overload.  No airspace disease or consolidation is identified. Linear subsegmental atelectasis is present over the right atrium on the lateral view.  IMPRESSION: Small bilateral pleural effusions with some cephalization of pulmonary blood flow suggesting volume overload and mild CHF. Consider repeat PA and lateral chest after acute illness to reassess the pulmonary hila.  Negative for pneumonia.  Original Report Authenticated By: Andreas Newport, M.D.   Nm Hepatobiliary Liver Func  04/02/2012  *RADIOLOGY REPORT*  Clinical Data: History of right lower chest pain.  NUCLEAR MEDICINE HEPATOBILIARY IMAGING WITH GALLBLADDER EF  Technique: Sequential images of the abdomen were obtained out to 60 minutes following intravenous administration of radiopharmaceutical. After slow intravenous infusion of 1.8 uCg Cholecystokinin, gallbladder ejection fraction was determined.  Radiopharmaceutical: 5.0 mCi Tc-24m Choletec  Comparison: Ultrasound 04/01/2012.  Findings: There is prompt  visualization of hepatic activity. There is prompt visualization of the common bile duct. Subsequently intestinal activity was identified.  The gallbladder began to visualize at 27 minutes.  During CCK infusion the gallbladder contracted 9.2%.  This is abnormally low. 30% or greater is normal range.  During CCK infusion the patient reported  experiencing sharp right upper quadrant abdominal pain.  IMPRESSION: There is demonstration of patency of the common bile duct and the cystic duct. There is no evidence of cholecystitis.  During CCK infusion the gallbladder contracted 9.2%.  This is abnormally low. 30% or greater is normal range.  During CCK infusion the patient reported  experiencing sharp right upper quadrant abdominal pain.  Original Report Authenticated By: Crawford Givens, M.D.   US Abdomen Complete  04/01/2012  *RADIOLOGY REPORT*  Clinical Data:  Abdominal pain and fever.  Elevated LFTs.  ABDOMINAL ULTRASOUND COMPLETE  Comparison:  Abdominal ultrasound performed 04/05/2011, and CT of the abdomen and pelvis performed 07/17/2006  Findings:  Gallbladder:  The gallbladder is normal in appearance, without evidence for gallstones, gallbladder wall thickening or pericholecystic fluid.  No ultrasonographic Murphy's sign is elicited.  Common Bile Duct:  0.3 cm in diameter; within normal limits in caliber. The common hepatic duct is difficult to characterize due to overlying bowel gas.  Liver: A small focus of increased echogenicity within the liver, measuring approximately 3.2 x 2.9 x 2.8 cm, likely reflects focal fatty infiltration on correlation with prior studies; no suspicious focal lesions are identified.  More mild diffuse fatty infiltration is seen, given mildly coarsened parenchymal echotexture.  Limited Doppler evaluation demonstrates normal blood flow within the liver.  IVC:  Unremarkable in appearance.  Pancreas:  Although the pancreas is difficult to visualize due to overlying bowel gas, no focal  pancreatic abnormality is identified.  Spleen:  10.9 cm in length; within normal limits in size and echotexture.  Right kidney:  10.3 cm in length; normal in size, configuration and parenchymal echogenicity.  No evidence of mass or hydronephrosis.  Left kidney:  11.7 cm in length; normal in size, configuration and parenchymal echogenicity.  No evidence of mass or hydronephrosis.  Abdominal Aorta:  Normal in caliber; no aneurysm identified.  IMPRESSION:  1.  No acute abnormalities seen in the abdomen. 2.  Mild diffuse fatty infiltration within the liver, with more focal fatty infiltration noted in the central right hepatic lobe, as characterized on prior studies.  Original Report Authenticated By: Tonia Ghent, M.D.    Microbiology: Recent Results (  from the past 240 hour(s))  URINE CULTURE     Status: Normal   Collection Time   04/01/12  4:33 AM      Component Value Range Status Comment   Specimen Description URINE, CLEAN CATCH   Final    Special Requests ADDED AT 1220   Final    Culture  Setup Time 04/01/2012 12:30   Final    Colony Count NO GROWTH   Final    Culture NO GROWTH   Final    Report Status 04/02/2012 FINAL   Final   CULTURE, BLOOD (ROUTINE X 2)     Status: Normal (Preliminary result)   Collection Time   04/01/12  9:23 AM      Component Value Range Status Comment   Specimen Description BLOOD RIGHT HAND   Final    Special Requests BOTTLES DRAWN AEROBIC AND ANAEROBIC 10CC   Final    Culture  Setup Time 04/01/2012 13:08   Final    Culture     Final    Value:        BLOOD CULTURE RECEIVED NO GROWTH TO DATE CULTURE WILL BE HELD FOR 5 DAYS BEFORE ISSUING A FINAL NEGATIVE REPORT   Report Status PENDING   Incomplete   CULTURE, BLOOD (ROUTINE X 2)     Status: Normal (Preliminary result)   Collection Time   04/01/12  9:33 AM      Component Value Range Status Comment   Specimen Description BLOOD RIGHT ANTECUBITAL   Final    Special Requests BOTTLES DRAWN AEROBIC ONLY 5CC   Final    Culture   Setup Time 04/01/2012 13:08   Final    Culture     Final    Value:        BLOOD CULTURE RECEIVED NO GROWTH TO DATE CULTURE WILL BE HELD FOR 5 DAYS BEFORE ISSUING A FINAL NEGATIVE REPORT   Report Status PENDING   Incomplete      Labs: Results for orders placed during the hospital encounter of 04/01/12  URINALYSIS, ROUTINE W REFLEX MICROSCOPIC      Component Value Range   Color, Urine AMBER (*) YELLOW   APPearance CLOUDY (*) CLEAR   Specific Gravity, Urine 1.019  1.005 - 1.030   pH 6.0  5.0 - 8.0   Glucose, UA NEGATIVE  NEGATIVE mg/dL   Hgb urine dipstick NEGATIVE  NEGATIVE   Bilirubin Urine SMALL (*) NEGATIVE   Ketones, ur 15 (*) NEGATIVE mg/dL   Protein, ur 30 (*) NEGATIVE mg/dL   Urobilinogen, UA 2.0 (*) 0.0 - 1.0 mg/dL   Nitrite NEGATIVE  NEGATIVE   Leukocytes, UA MODERATE (*) NEGATIVE  CBC WITH DIFFERENTIAL      Component Value Range   WBC 2.4 (*) 4.0 - 10.5 K/uL   RBC 4.57  3.87 - 5.11 MIL/uL   Hemoglobin 13.1  12.0 - 15.0 g/dL   HCT 16.1  09.6 - 04.5 %   MCV 83.6  78.0 - 100.0 fL   MCH 28.7  26.0 - 34.0 pg   MCHC 34.3  30.0 - 36.0 g/dL   RDW 40.9  81.1 - 91.4 %   Platelets 138 (*) 150 - 400 K/uL   Neutrophils Relative 81 (*) 43 - 77 %   Neutro Abs 1.9  1.7 - 7.7 K/uL   Lymphocytes Relative 15  12 - 46 %   Lymphs Abs 0.4 (*) 0.7 - 4.0 K/uL   Monocytes Relative 3  3 - 12 %  Monocytes Absolute 0.1  0.1 - 1.0 K/uL   Eosinophils Relative 0  0 - 5 %   Eosinophils Absolute 0.0  0.0 - 0.7 K/uL   Basophils Relative 0  0 - 1 %   Basophils Absolute 0.0  0.0 - 0.1 K/uL  COMPREHENSIVE METABOLIC PANEL      Component Value Range   Sodium 137  135 - 145 mEq/L   Potassium 3.5  3.5 - 5.1 mEq/L   Chloride 102  96 - 112 mEq/L   CO2 25  19 - 32 mEq/L   Glucose, Bld 105 (*) 70 - 99 mg/dL   BUN 12  6 - 23 mg/dL   Creatinine, Ser 1.61  0.50 - 1.10 mg/dL   Calcium 8.7  8.4 - 09.6 mg/dL   Total Protein 6.8  6.0 - 8.3 g/dL   Albumin 3.2 (*) 3.5 - 5.2 g/dL   AST 045 (*) 0 - 37 U/L    ALT 121 (*) 0 - 35 U/L   Alkaline Phosphatase 221 (*) 39 - 117 U/L   Total Bilirubin 1.4 (*) 0.3 - 1.2 mg/dL   GFR calc non Af Amer 55 (*) >90 mL/min   GFR calc Af Amer 64 (*) >90 mL/min  POCT I-STAT TROPONIN I      Component Value Range   Troponin i, poc 0.00  0.00 - 0.08 ng/mL   Comment 3           URINE MICROSCOPIC-ADD ON      Component Value Range   Squamous Epithelial / LPF MANY (*) RARE   WBC, UA 3-6  <3 WBC/hpf   RBC / HPF 0-2  <3 RBC/hpf   Bacteria, UA MANY (*) RARE   Casts GRANULAR CAST (*) NEGATIVE  HIV ANTIBODY (ROUTINE TESTING)      Component Value Range   HIV NON REACTIVE  NON REACTIVE  HEPATITIS PANEL, ACUTE      Component Value Range   Hepatitis B Surface Ag NEGATIVE  NEGATIVE   HCV Ab NEGATIVE  NEGATIVE   Hep A IgM NEGATIVE  NEGATIVE   Hep B C IgM NEGATIVE  NEGATIVE  URINE CULTURE      Component Value Range   Specimen Description URINE, CLEAN CATCH     Special Requests ADDED AT 1220     Culture  Setup Time 04/01/2012 12:30     Colony Count NO GROWTH     Culture NO GROWTH     Report Status 04/02/2012 FINAL    CULTURE, BLOOD (ROUTINE X 2)      Component Value Range   Specimen Description BLOOD RIGHT HAND     Special Requests BOTTLES DRAWN AEROBIC AND ANAEROBIC 10CC     Culture  Setup Time 04/01/2012 13:08     Culture       Value:        BLOOD CULTURE RECEIVED NO GROWTH TO DATE CULTURE WILL BE HELD FOR 5 DAYS BEFORE ISSUING A FINAL NEGATIVE REPORT   Report Status PENDING    CULTURE, BLOOD (ROUTINE X 2)      Component Value Range   Specimen Description BLOOD RIGHT ANTECUBITAL     Special Requests BOTTLES DRAWN AEROBIC ONLY 5CC     Culture  Setup Time 04/01/2012 13:08     Culture       Value:        BLOOD CULTURE RECEIVED NO GROWTH TO DATE CULTURE WILL BE HELD FOR 5 DAYS BEFORE ISSUING A FINAL NEGATIVE REPORT  Report Status PENDING    ROCKY MTN SPOTTED FVR AB, IGM-BLOOD      Component Value Range   RMSF IgM 0.15  0.00 - 0.89 IV  EHRLICHIA ANTIBODY PANEL       Component Value Range   E chaffeensis Ab, IgG Negative  Negative   E chaffeensis Ab, IgM Negative  Negative  COMPREHENSIVE METABOLIC PANEL      Component Value Range   Sodium 135  135 - 145 mEq/L   Potassium 3.6  3.5 - 5.1 mEq/L   Chloride 100  96 - 112 mEq/L   CO2 25  19 - 32 mEq/L   Glucose, Bld 117 (*) 70 - 99 mg/dL   BUN 12  6 - 23 mg/dL   Creatinine, Ser 9.60  0.50 - 1.10 mg/dL   Calcium 8.4  8.4 - 45.4 mg/dL   Total Protein 6.2  6.0 - 8.3 g/dL   Albumin 2.7 (*) 3.5 - 5.2 g/dL   AST 098 (*) 0 - 37 U/L   ALT 118 (*) 0 - 35 U/L   Alkaline Phosphatase 219 (*) 39 - 117 U/L   Total Bilirubin 1.6 (*) 0.3 - 1.2 mg/dL   GFR calc non Af Amer 63 (*) >90 mL/min   GFR calc Af Amer 73 (*) >90 mL/min  CBC      Component Value Range   WBC 2.0 (*) 4.0 - 10.5 K/uL   RBC 4.20  3.87 - 5.11 MIL/uL   Hemoglobin 11.8 (*) 12.0 - 15.0 g/dL   HCT 11.9 (*) 14.7 - 82.9 %   MCV 83.8  78.0 - 100.0 fL   MCH 28.1  26.0 - 34.0 pg   MCHC 33.5  30.0 - 36.0 g/dL   RDW 56.2  13.0 - 86.5 %   Platelets 124 (*) 150 - 400 K/uL  CBC WITH DIFFERENTIAL      Component Value Range   WBC 3.5 (*) 4.0 - 10.5 K/uL   RBC 4.11  3.87 - 5.11 MIL/uL   Hemoglobin 11.3 (*) 12.0 - 15.0 g/dL   HCT 78.4 (*) 69.6 - 29.5 %   MCV 83.7  78.0 - 100.0 fL   MCH 27.5  26.0 - 34.0 pg   MCHC 32.8  30.0 - 36.0 g/dL   RDW 28.4  13.2 - 44.0 %   Platelets 138 (*) 150 - 400 K/uL   Neutrophils Relative 50  43 - 77 %   Neutro Abs 1.8  1.7 - 7.7 K/uL   Lymphocytes Relative 36  12 - 46 %   Lymphs Abs 1.3  0.7 - 4.0 K/uL   Monocytes Relative 13 (*) 3 - 12 %   Monocytes Absolute 0.4  0.1 - 1.0 K/uL   Eosinophils Relative 0  0 - 5 %   Eosinophils Absolute 0.0  0.0 - 0.7 K/uL   Basophils Relative 2 (*) 0 - 1 %   Basophils Absolute 0.1  0.0 - 0.1 K/uL     Signed: , NEVILL 04/03/2012, 7:33 AM

## 2012-04-03 NOTE — Consult Note (Signed)
Reason for Consult:biliary dyskinesis Referring Physician: Dr. Dorris Carnes. Amanda Lambert is an 66 y.o. female.  HPI: I have been asked to see this pleasant patient secondary to her diagnosis of biliary dyskinesia. She has been known to have dyskinesia for at least a year. She does have intermittent fatty food intolerance. She was actually admitted after several days of fever, intermittent right upper quadrant abdominal pain, nausea, and vomiting. It is suspected that she may have [place] spotted fever although her titers are negative.  Since admission, she has had an ultrasound which was negative for gallstones. Bile duct was normal. She also has had a HIDA scan showing no evidence of cystic duct obstruction and a patent bile duct. Her ejection fraction was 9% and right upper quadrant pain was reproduced with CCK. Interestingly, her liver function tests have been elevated including bilirubin and alkaline phosphatase. Currently she is comfortable with only mild discomfort in the right upper quadrant along the ribs. She currently denies nausea. The pain is now referred anywhere else. It is sharp in nature  Past Medical History  Diagnosis Date  . Hypertension   . PONV (postoperative nausea and vomiting)     Past Surgical History  Procedure Date  . Appendectomy     History reviewed. No pertinent family history.  Social History:  reports that she has never smoked. She has never used smokeless tobacco. She reports that she does not drink alcohol or use illicit drugs.  Allergies:  Allergies  Allergen Reactions  . Thorazine (Chlorpromazine) Anaphylaxis  . Ace Inhibitors Other (See Comments)    Caused Angioedema.  . Compazine (Prochlorperazine Edisylate) Other (See Comments)    Becomes anxious.    Medications: I have reviewed the patient's current medications.  Results for orders placed during the hospital encounter of 04/01/12 (from the past 48 hour(s))  HIV ANTIBODY (ROUTINE TESTING)      Status: Normal   Collection Time   04/01/12  9:23 AM      Component Value Range Comment   HIV NON REACTIVE  NON REACTIVE   HEPATITIS PANEL, ACUTE     Status: Normal   Collection Time   04/01/12  9:23 AM      Component Value Range Comment   Hepatitis B Surface Ag NEGATIVE  NEGATIVE    HCV Ab NEGATIVE  NEGATIVE    Hep A IgM NEGATIVE  NEGATIVE    Hep B C IgM NEGATIVE  NEGATIVE   CULTURE, BLOOD (ROUTINE X 2)     Status: Normal (Preliminary result)   Collection Time   04/01/12  9:23 AM      Component Value Range Comment   Specimen Description BLOOD RIGHT HAND      Special Requests BOTTLES DRAWN AEROBIC AND ANAEROBIC 10CC      Culture  Setup Time 04/01/2012 13:08      Culture        Value:        BLOOD CULTURE RECEIVED NO GROWTH TO DATE CULTURE WILL BE HELD FOR 5 DAYS BEFORE ISSUING A FINAL NEGATIVE REPORT   Report Status PENDING     ROCKY MTN SPOTTED FVR AB, IGM-BLOOD     Status: Normal   Collection Time   04/01/12  9:23 AM      Component Value Range Comment   RMSF IgM 0.15  0.00 - 0.89 IV   EHRLICHIA ANTIBODY PANEL     Status: Normal   Collection Time   04/01/12  9:23 AM  Component Value Range Comment   E chaffeensis Ab, IgG Negative  Negative    E chaffeensis Ab, IgM Negative  Negative   CULTURE, BLOOD (ROUTINE X 2)     Status: Normal (Preliminary result)   Collection Time   04/01/12  9:33 AM      Component Value Range Comment   Specimen Description BLOOD RIGHT ANTECUBITAL      Special Requests BOTTLES DRAWN AEROBIC ONLY 5CC      Culture  Setup Time 04/01/2012 13:08      Culture        Value:        BLOOD CULTURE RECEIVED NO GROWTH TO DATE CULTURE WILL BE HELD FOR 5 DAYS BEFORE ISSUING A FINAL NEGATIVE REPORT   Report Status PENDING     COMPREHENSIVE METABOLIC PANEL     Status: Abnormal   Collection Time   04/02/12  6:46 AM      Component Value Range Comment   Sodium 135  135 - 145 mEq/L    Potassium 3.6  3.5 - 5.1 mEq/L    Chloride 100  96 - 112 mEq/L    CO2 25  19 - 32  mEq/L    Glucose, Bld 117 (*) 70 - 99 mg/dL    BUN 12  6 - 23 mg/dL    Creatinine, Ser 6.29  0.50 - 1.10 mg/dL    Calcium 8.4  8.4 - 52.8 mg/dL    Total Protein 6.2  6.0 - 8.3 g/dL    Albumin 2.7 (*) 3.5 - 5.2 g/dL    AST 413 (*) 0 - 37 U/L    ALT 118 (*) 0 - 35 U/L    Alkaline Phosphatase 219 (*) 39 - 117 U/L    Total Bilirubin 1.6 (*) 0.3 - 1.2 mg/dL    GFR calc non Af Amer 63 (*) >90 mL/min    GFR calc Af Amer 73 (*) >90 mL/min   CBC     Status: Abnormal   Collection Time   04/02/12  6:46 AM      Component Value Range Comment   WBC 2.0 (*) 4.0 - 10.5 K/uL    RBC 4.20  3.87 - 5.11 MIL/uL    Hemoglobin 11.8 (*) 12.0 - 15.0 g/dL    HCT 24.4 (*) 01.0 - 46.0 %    MCV 83.8  78.0 - 100.0 fL    MCH 28.1  26.0 - 34.0 pg    MCHC 33.5  30.0 - 36.0 g/dL    RDW 27.2  53.6 - 64.4 %    Platelets 124 (*) 150 - 400 K/uL   HEPATIC FUNCTION PANEL     Status: Abnormal   Collection Time   04/03/12  5:00 AM      Component Value Range Comment   Total Protein 6.1  6.0 - 8.3 g/dL    Albumin 2.8 (*) 3.5 - 5.2 g/dL    AST 034 (*) 0 - 37 U/L    ALT 128 (*) 0 - 35 U/L    Alkaline Phosphatase 321 (*) 39 - 117 U/L    Total Bilirubin 1.3 (*) 0.3 - 1.2 mg/dL    Bilirubin, Direct 0.7 (*) 0.0 - 0.3 mg/dL    Indirect Bilirubin 0.6  0.3 - 0.9 mg/dL   BASIC METABOLIC PANEL     Status: Abnormal   Collection Time   04/03/12  5:00 AM      Component Value Range Comment   Sodium 135  135 -  145 mEq/L    Potassium 3.4 (*) 3.5 - 5.1 mEq/L    Chloride 100  96 - 112 mEq/L    CO2 24  19 - 32 mEq/L    Glucose, Bld 103 (*) 70 - 99 mg/dL    BUN 11  6 - 23 mg/dL    Creatinine, Ser 8.29  0.50 - 1.10 mg/dL    Calcium 8.3 (*) 8.4 - 10.5 mg/dL    GFR calc non Af Amer 60 (*) >90 mL/min    GFR calc Af Amer 70 (*) >90 mL/min   CBC WITH DIFFERENTIAL     Status: Abnormal   Collection Time   04/03/12  5:00 AM      Component Value Range Comment   WBC 3.5 (*) 4.0 - 10.5 K/uL    RBC 4.11  3.87 - 5.11 MIL/uL    Hemoglobin 11.3  (*) 12.0 - 15.0 g/dL    HCT 56.2 (*) 13.0 - 46.0 %    MCV 83.7  78.0 - 100.0 fL    MCH 27.5  26.0 - 34.0 pg    MCHC 32.8  30.0 - 36.0 g/dL    RDW 86.5  78.4 - 69.6 %    Platelets 138 (*) 150 - 400 K/uL    Neutrophils Relative 50  43 - 77 %    Neutro Abs 1.8  1.7 - 7.7 K/uL    Lymphocytes Relative 36  12 - 46 %    Lymphs Abs 1.3  0.7 - 4.0 K/uL    Monocytes Relative 13 (*) 3 - 12 %    Monocytes Absolute 0.4  0.1 - 1.0 K/uL    Eosinophils Relative 0  0 - 5 %    Eosinophils Absolute 0.0  0.0 - 0.7 K/uL    Basophils Relative 2 (*) 0 - 1 %    Basophils Absolute 0.1  0.0 - 0.1 K/uL     Dg Chest 2 View  04/02/2012  *RADIOLOGY REPORT*  Clinical Data: Right-sided chest pain.  Fever.  CHEST - 2 VIEW  Comparison: None.  Findings: Small bilateral pleural effusions are present. Cardiopericardial silhouette appears within normal limits.  Mild basilar atelectasis is present in the lungs.  Mild bilateral pleural apical scarring/thickening.  Trachea midline.  There is mild prominence of pulmonary hila.  There does appear to be some cephalization of pulmonary blood flow, suggesting volume overload.  No airspace disease or consolidation is identified. Linear subsegmental atelectasis is present over the right atrium on the lateral view.  IMPRESSION: Small bilateral pleural effusions with some cephalization of pulmonary blood flow suggesting volume overload and mild CHF. Consider repeat PA and lateral chest after acute illness to reassess the pulmonary hila.  Negative for pneumonia.  Original Report Authenticated By: Andreas Newport, M.D.   Nm Hepatobiliary Liver Func  04/02/2012  *RADIOLOGY REPORT*  Clinical Data: History of right lower chest pain.  NUCLEAR MEDICINE HEPATOBILIARY IMAGING WITH GALLBLADDER EF  Technique: Sequential images of the abdomen were obtained out to 60 minutes following intravenous administration of radiopharmaceutical. After slow intravenous infusion of 1.8 uCg Cholecystokinin, gallbladder  ejection fraction was determined.  Radiopharmaceutical: 5.0 mCi Tc-39m Choletec  Comparison: Ultrasound 04/01/2012.  Findings: There is prompt visualization of hepatic activity. There is prompt visualization of the common bile duct. Subsequently intestinal activity was identified.  The gallbladder began to visualize at 27 minutes.  During CCK infusion the gallbladder contracted 9.2%.  This is abnormally low. 30% or greater is normal range.  During  CCK infusion the patient reported  experiencing sharp right upper quadrant abdominal pain.  IMPRESSION: There is demonstration of patency of the common bile duct and the cystic duct. There is no evidence of cholecystitis.  During CCK infusion the gallbladder contracted 9.2%.  This is abnormally low. 30% or greater is normal range.  During CCK infusion the patient reported  experiencing sharp right upper quadrant abdominal pain.  Original Report Authenticated By: Crawford Givens, M.D.    Review of Systems  Constitutional: Positive for fever.  HENT: Negative.   Eyes: Negative.   Respiratory: Negative.   Cardiovascular: Negative.   Gastrointestinal: Positive for nausea, vomiting and abdominal pain.  Genitourinary: Negative.   Musculoskeletal: Negative.   Skin: Negative.   Neurological: Negative.   Endo/Heme/Allergies: Negative.   Psychiatric/Behavioral: Negative.    Blood pressure 132/57, pulse 78, temperature 99.5 F (37.5 C), temperature source Oral, resp. rate 16, height 5\' 8"  (1.727 m), weight 198 lb 12.9 oz (90.179 kg), SpO2 95.00%. Physical Exam  Constitutional: She is oriented to person, place, and time. She appears well-developed and well-nourished. No distress.  HENT:  Head: Normocephalic and atraumatic.  Right Ear: External ear normal.  Left Ear: External ear normal.  Nose: Nose normal.  Mouth/Throat: No oropharyngeal exudate.  Eyes: Conjunctivae are normal. Pupils are equal, round, and reactive to light. Right eye exhibits no discharge. Left  eye exhibits no discharge. No scleral icterus.  Neck: Normal range of motion. No tracheal deviation present.  Cardiovascular: Normal rate, regular rhythm, normal heart sounds and intact distal pulses.   No murmur heard. Respiratory: Effort normal and breath sounds normal. No respiratory distress. She has no wheezes. She has no rales.  GI: Soft. Bowel sounds are normal. She exhibits no distension. There is tenderness. There is no guarding.       Her abdomen is soft with very minimal tenderness and no guarding in the right upper quadrant.  Musculoskeletal: Normal range of motion. She exhibits no edema and no tenderness.  Lymphadenopathy:    She has no cervical adenopathy.  Neurological: She is alert and oriented to person, place, and time.  Skin: Skin is warm and dry. No rash noted. She is not diaphoretic. No erythema.  Psychiatric: Her behavior is normal. Judgment normal.    Assessment/Plan: Biliary dyskinesia with fevers and elevated liver function test of uncertain etiology. Based on her exam and x-ray data, I do not believe she has acute cholecystitis. I believe whatever infectious process she had maybe because of elevated liver function test. She does not appear acutely sick. I do believe she will need an eventual a laparoscopic cholecystectomy secondary to her symptomatic dyskinesia.  From a surgical standpoint, I believe it is okay to discharge her today and I will follow her closely in the office to discuss eventual surgery.  , A 04/03/2012, 8:14 AM

## 2012-04-03 NOTE — Progress Notes (Signed)
Patient ID: Amanda Lambert, female   DOB: 18-Feb-1946, 66 y.o.   MRN: [id number]    [facility]    Date of Admission:  04/01/2012           Day 3 doxycycline Active Problems:  Fever  Transaminitis  UTI (lower urinary tract infection)  Leukopenia  Thrombocytopenia  HTN (hypertension)  GERD (gastroesophageal reflux disease)  Oral thrush      . docusate sodium  100 mg Oral BID  . doxycycline  100 mg Oral Q12H  . metoprolol  50 mg Oral BID  . nystatin  5 mL Oral QID  . pantoprazole  40 mg Oral Daily  . sincalide  0.02 mcg/kg Intravenous Once    Subjective: Amanda Lambert is feeling much better. She has tolerated doxycycline well without any further nausea or vomiting. She has not had any further fever, chills, myalgias or headache.  Objective: Temp:  [98.8 F (37.1 C)-99.5 F (37.5 C)] 99.5 F (37.5 C) (07/10 0535) Pulse Rate:  [67-78] 78  (07/10 0535) Resp:  [16-18] 16  (07/10 0535) BP: (119-132)/(56-59) 132/57 mmHg (07/10 0535) SpO2:  [92 %-99 %] 95 % (07/10 0535)  General: She is in good spirits Skin: No rash Lungs: Clear Cor: Regular S1 and S2 no murmurs Abdomen: Soft, mild right upper quadrant tenderness  Lab Results Lab Results  Component Value Date   WBC 3.5* 04/03/2012   HGB 11.3* 04/03/2012   HCT 34.4* 04/03/2012   MCV 83.7 04/03/2012   PLT 138* 04/03/2012    Lab Results  Component Value Date   CREATININE 0.97 04/03/2012   BUN 11 04/03/2012   NA 135 04/03/2012   K 3.4* 04/03/2012   CL 100 04/03/2012   CO2 24 04/03/2012    Lab Results  Component Value Date   ALT 128* 04/03/2012   AST 170* 04/03/2012   ALKPHOS 321* 04/03/2012   BILITOT 1.3* 04/03/2012      Studies/Results: Dg Chest 2 View  04/02/2012  *RADIOLOGY REPORT*  Clinical Data: Right-sided chest pain.  Fever.  CHEST - 2 VIEW  Comparison: None.  Findings: Small bilateral pleural effusions are present. Cardiopericardial silhouette appears within normal limits.  Mild basilar atelectasis  is present in the lungs.  Mild bilateral pleural apical scarring/thickening.  Trachea midline.  There is mild prominence of pulmonary hila.  There does appear to be some cephalization of pulmonary blood flow, suggesting volume overload.  No airspace disease or consolidation is identified. Linear subsegmental atelectasis is present over the right atrium on the lateral view.  IMPRESSION: Small bilateral pleural effusions with some cephalization of pulmonary blood flow suggesting volume overload and mild CHF. Consider repeat PA and lateral chest after acute illness to reassess the pulmonary hila.  Negative for pneumonia.  Original Report Authenticated By: Andreas Newport, M.D.   Nm Hepatobiliary Liver Func  04/02/2012  *RADIOLOGY REPORT*  Clinical Data: History of right lower chest pain.  NUCLEAR MEDICINE HEPATOBILIARY IMAGING WITH GALLBLADDER EF  Technique: Sequential images of the abdomen were obtained out to 60 minutes following intravenous administration of radiopharmaceutical. After slow intravenous infusion of 1.8 uCg Cholecystokinin, gallbladder ejection fraction was determined.  Radiopharmaceutical: 5.0 mCi Tc-68m Choletec  Comparison: Ultrasound 04/01/2012.  Findings: There is prompt visualization of hepatic activity. There is prompt visualization of the common bile duct. Subsequently intestinal activity was identified.  The gallbladder began to visualize at 27 minutes.  During CCK infusion the gallbladder contracted 9.2%.  This is abnormally low. 30% or  greater is normal range.  During CCK infusion the patient reported  experiencing sharp right upper quadrant abdominal pain.  IMPRESSION: There is demonstration of patency of the common bile duct and the cystic duct. There is no evidence of cholecystitis.  During CCK infusion the gallbladder contracted 9.2%.  This is abnormally low. 30% or greater is normal range.  During CCK infusion the patient reported  experiencing sharp right upper quadrant abdominal  pain.  Original Report Authenticated By: Crawford Givens, M.D.    Assessment: She's had dramatic improvement with doxycycline therapy. I suspect that she had early [facility] spotted fever or that exacerbated her chronic biliary dyskinesia.  Plan: 1. Agree with discharge on doxycycline 2. I asked her to call me if she has any problems with the doxycycline after discharge  Cliffton Asters, MD [facility] [phone] pager   [phone] cell 04/03/2012, 11:16 AM

## 2012-04-05 ENCOUNTER — Telehealth: Payer: Self-pay

## 2012-04-05 ENCOUNTER — Telehealth: Payer: Self-pay | Admitting: *Deleted

## 2012-04-05 ENCOUNTER — Other Ambulatory Visit: Payer: Self-pay | Admitting: Internal Medicine

## 2012-04-05 DIAGNOSIS — R509 Fever, unspecified: Secondary | ICD-10-CM

## 2012-04-05 MED ORDER — LEVOFLOXACIN 500 MG PO TABS: 500.0000 mg | ORAL_TABLET | Freq: Every day | ORAL | Status: DC

## 2012-04-05 NOTE — Telephone Encounter (Signed)
Called patient and had to leave a message that the provider changed her medication to levaquin 500 mg daily and [facility]. Will check later to see if she got the message.

## 2012-04-05 NOTE — Telephone Encounter (Signed)
I will change her to levofloxacin 500 mg daily for 5 more days has second line therapy for presumed [facility] spotted fever.

## 2012-04-05 NOTE — Telephone Encounter (Signed)
Pt has c/o dizziness ands slight blurred vision  with any movement.  She has been taking the doxycyline for 3 days. Symptoms started yesterday and continued into early this morning. Pt states she did not take the doxycyline this morning and already feels better.  She feels these symptoms are directly related to the medication.  Pt wonders if there is a different medication she can take in the place of the doxycycline.   Please advise.   Pharmacy : [facility]  Patient's phone:  [phone]

## 2012-04-07 LAB — CULTURE, BLOOD (ROUTINE X 2): Culture: NO GROWTH

## 2012-04-09 ENCOUNTER — Encounter (INDEPENDENT_AMBULATORY_CARE_PROVIDER_SITE_OTHER): Payer: Self-pay | Admitting: General Surgery

## 2012-04-09 ENCOUNTER — Ambulatory Visit (INDEPENDENT_AMBULATORY_CARE_PROVIDER_SITE_OTHER): Payer: Commercial Managed Care - PPO | Admitting: General Surgery

## 2012-04-09 VITALS — BP 128/76 | HR 68 | Temp 96.9°F | Resp 16 | Ht 68.0 in | Wt 201.0 lb

## 2012-04-09 DIAGNOSIS — K828 Other specified diseases of gallbladder: Secondary | ICD-10-CM | POA: Insufficient documentation

## 2012-04-09 NOTE — Progress Notes (Signed)
Subjective:     Patient ID: Amanda Lambert, female   DOB: 04/05/1946, 66 y.o.   MRN: [id number]  HPI We are asked to see the patient in consultation by Dr. Dayton Scrape to evaluate her for biliary dyskinesia. The patient is a 66 year old white female who was recently hospitalized with flulike symptoms. She was having upper abdominal discomfort. She was diagnosed with recommended spotted fever. Because of this her liver functions have been elevated. During her workup she was also having diarrhea and some upper abdominal pain. She was evaluated with a HIDA scan that did show a significantly reduced ejection fraction. Her ultrasound did not show any gallstones or inflammation. In retrospect she feels as though she's been having some diarrhea off and on for years with some occasional right upper quadrant discomfort.  Review of Systems  Constitutional: Negative.   HENT: Negative.   Eyes: Negative.   Respiratory: Negative.   Cardiovascular: Negative.   Gastrointestinal: Negative.   Genitourinary: Negative.   Musculoskeletal: Negative.   Skin: Negative.   Neurological: Negative.   Hematological: Negative.   Psychiatric/Behavioral: Negative.        Objective:   Physical Exam  Constitutional: She is oriented to person, place, and time. She appears well-developed and well-nourished.  HENT:  Head: Normocephalic and atraumatic.  Eyes: Conjunctivae and EOM are normal. Pupils are equal, round, and reactive to light.  Neck: Normal range of motion. Neck supple.  Cardiovascular: Normal rate, regular rhythm and normal heart sounds.   Pulmonary/Chest: Effort normal and breath sounds normal.  Abdominal: Soft. Bowel sounds are normal.       Mild epigastric and right upper quadrant pain. No palpable mass  Musculoskeletal: Normal range of motion.  Neurological: She is alert and oriented to person, place, and time.  Skin: Skin is warm and dry.  Psychiatric: She has a normal mood and affect. Her behavior is  normal.       Assessment:     The patient has what appears to be symptomatic biliary dyskinesia. This seems to be complicated also by [facility] spotted fever. I feel as though there is a very good chance that removing her gallbladder may improve her symptoms but she understands that I cannot guarantee it. I have discussed with her in detail the risks and benefits of the operation to remove the gallbladder as well as some of the technical aspects and she understands and wishes to proceed. I would agree with this.    Plan:     Plan for laparoscopic cholecystectomy with intraoperative cholangiogram

## 2012-04-15 ENCOUNTER — Ambulatory Visit (INDEPENDENT_AMBULATORY_CARE_PROVIDER_SITE_OTHER): Payer: Medicare Other | Admitting: Surgery

## 2012-04-16 ENCOUNTER — Encounter (HOSPITAL_COMMUNITY)
Admission: RE | Admit: 2012-04-16 | Discharge: 2012-04-16 | Disposition: A | Discharge status: Home / Self Care | Payer: 59 | Source: Ambulatory Visit | Attending: General Surgery | Admitting: General Surgery

## 2012-04-16 ENCOUNTER — Encounter (HOSPITAL_COMMUNITY): Payer: Self-pay | Admitting: Pharmacy Technician

## 2012-04-16 ENCOUNTER — Encounter (HOSPITAL_COMMUNITY): Payer: Self-pay

## 2012-04-16 HISTORY — DX: Unspecified osteoarthritis, unspecified site: M19.90

## 2012-04-16 HISTORY — DX: Spotted fever due to Rickettsia rickettsii: A77.0

## 2012-04-16 HISTORY — DX: Gastro-esophageal reflux disease without esophagitis: K21.9

## 2012-04-16 HISTORY — DX: Unspecified viral hepatitis B without hepatic coma: B19.10

## 2012-04-16 LAB — CBC WITH DIFFERENTIAL/PLATELET
Basophils Relative: 2 % — ABNORMAL HIGH (ref 0–1)
Eosinophils Absolute: 0.1 10*3/uL (ref 0.0–0.7)
Eosinophils Relative: 2 % (ref 0–5)
HCT: 38.4 % (ref 36.0–46.0)
Hemoglobin: 12.7 g/dL (ref 12.0–15.0)
Lymphs Abs: 2.8 10*3/uL (ref 0.7–4.0)
MCH: 27.9 pg (ref 26.0–34.0)
MCHC: 33.1 g/dL (ref 30.0–36.0)
MCV: 84.2 fL (ref 78.0–100.0)
Monocytes Absolute: 0.6 10*3/uL (ref 0.1–1.0)
Monocytes Relative: 8 % (ref 3–12)
Neutrophils Relative %: 51 % (ref 43–77)
RBC: 4.56 MIL/uL (ref 3.87–5.11)

## 2012-04-16 LAB — COMPREHENSIVE METABOLIC PANEL
Albumin: 3.4 g/dL — ABNORMAL LOW (ref 3.5–5.2)
BUN: 12 mg/dL (ref 6–23)
Chloride: 102 mEq/L (ref 96–112)
Creatinine, Ser: 0.8 mg/dL (ref 0.50–1.10)
GFR calc Af Amer: 88 mL/min — ABNORMAL LOW (ref >90)
Total Bilirubin: 0.4 mg/dL (ref 0.3–1.2)

## 2012-04-16 LAB — SURGICAL PCR SCREEN
MRSA, PCR: INVALID — AB
Staphylococcus aureus: INVALID — AB

## 2012-04-16 NOTE — Patient Instructions (Signed)
20 Amanda Lambert  04/16/2012   Your procedure is scheduled on:  Friday 04/26/2012 at 0730 am  Report to [facility] at 0530 AM.  Call this number if you have problems the morning of surgery: [phone]   Remember:   Do not eat food:After Midnight.  May have clear liquids:until Midnight .   Take these medicines the morning of surgery with A SIP OF WATER: Metoprolol, Nexium  Do not wear jewelry, make-up or nail polish.  Do not wear lotions, powders, or perfumes.   Do not shave 48 hours prior to surgery. Men may shave face and neck.  Do not bring valuables to the hospital.  Contacts, dentures or bridgework may not be worn into surgery.  Leave suitcase in the car. After surgery it may be brought to your room.  For patients admitted to the hospital, checkout time is 11:00 AM the day of discharge.       Special Instructions: CHG Shower Use Special Wash: 1/2 bottle night before surgery and 1/2 bottle morning of surgery.   Please read over the following fact sheets that you were given: MRSA Information, Sleep apnea sheet, Incentive Spirometry sheet,                 If you have any questions, please call me Telford Nab.[name], RN,BSN at [phone]

## 2012-04-20 LAB — MRSA CULTURE

## 2012-04-25 ENCOUNTER — Telehealth (INDEPENDENT_AMBULATORY_CARE_PROVIDER_SITE_OTHER): Payer: Self-pay | Admitting: General Surgery

## 2012-04-25 NOTE — Telephone Encounter (Signed)
LMOM letting pt know that her first PO appt will be on 05/21/12 at 11:30.  I also sent a reminder card in the mail and explained on the voicemail that if this time does not work then they can call me back.

## 2012-04-26 ENCOUNTER — Ambulatory Visit (HOSPITAL_COMMUNITY): Payer: 59

## 2012-04-26 ENCOUNTER — Ambulatory Visit (HOSPITAL_COMMUNITY)
Admission: RE | Admit: 2012-04-26 | Discharge: 2012-04-26 | Disposition: A | Discharge status: Home / Self Care | Payer: 59 | Source: Ambulatory Visit | Attending: General Surgery | Admitting: General Surgery

## 2012-04-26 ENCOUNTER — Encounter (HOSPITAL_COMMUNITY): Payer: Self-pay | Admitting: *Deleted

## 2012-04-26 ENCOUNTER — Ambulatory Visit (HOSPITAL_COMMUNITY): Payer: 59 | Admitting: Anesthesiology

## 2012-04-26 ENCOUNTER — Encounter (HOSPITAL_COMMUNITY)
Admission: RE | Disposition: A | Discharge status: Home / Self Care | Payer: Self-pay | Source: Ambulatory Visit | Attending: General Surgery

## 2012-04-26 ENCOUNTER — Encounter (HOSPITAL_COMMUNITY): Payer: Self-pay | Admitting: Anesthesiology

## 2012-04-26 DIAGNOSIS — K811 Chronic cholecystitis: Secondary | ICD-10-CM | POA: Insufficient documentation

## 2012-04-26 DIAGNOSIS — Z01812 Encounter for preprocedural laboratory examination: Secondary | ICD-10-CM | POA: Insufficient documentation

## 2012-04-26 DIAGNOSIS — K828 Other specified diseases of gallbladder: Secondary | ICD-10-CM | POA: Insufficient documentation

## 2012-04-26 HISTORY — PX: CHOLECYSTECTOMY: SHX55

## 2012-04-26 SURGERY — LAPAROSCOPIC CHOLECYSTECTOMY WITH INTRAOPERATIVE CHOLANGIOGRAM
Anesthesia: General | Wound class: Clean Contaminated

## 2012-04-26 MED ORDER — OXYCODONE HCL 5 MG PO TABS
5.0000 mg | ORAL_TABLET | ORAL | Status: DC | PRN
  Administered 2012-04-26: 5 mg via ORAL
  Filled 2012-04-26: qty 1

## 2012-04-26 MED ORDER — SODIUM CHLORIDE 0.9 % IJ SOLN: 3.0000 mL | INTRAMUSCULAR | Status: DC | PRN

## 2012-04-26 MED ORDER — CISATRACURIUM BESYLATE (PF) 10 MG/5ML IV SOLN
INTRAVENOUS | Status: DC | PRN
  Administered 2012-04-26: 8 mg via INTRAVENOUS

## 2012-04-26 MED ORDER — HYDROCODONE-ACETAMINOPHEN 5-325 MG PO TABS: 1.0000 | ORAL_TABLET | Freq: Four times a day (QID) | ORAL | Status: AC | PRN

## 2012-04-26 MED ORDER — CEFAZOLIN SODIUM-DEXTROSE 2-3 GM-% IV SOLR
INTRAVENOUS | Status: AC
  Filled 2012-04-26: qty 50

## 2012-04-26 MED ORDER — DIPHENHYDRAMINE HCL 50 MG/ML IJ SOLN
INTRAMUSCULAR | Status: DC | PRN
  Administered 2012-04-26: 12.5 mg via INTRAVENOUS

## 2012-04-26 MED ORDER — CEFAZOLIN SODIUM-DEXTROSE 2-3 GM-% IV SOLR
2.0000 g | INTRAVENOUS | Status: AC
  Administered 2012-04-26: 2 g via INTRAVENOUS

## 2012-04-26 MED ORDER — MORPHINE SULFATE 10 MG/ML IJ SOLN: 2.0000 mg | INTRAMUSCULAR | Status: DC | PRN

## 2012-04-26 MED ORDER — SODIUM CHLORIDE 0.9 % IJ SOLN
INTRAMUSCULAR | Status: DC | PRN
  Administered 2012-04-26: 08:00:00

## 2012-04-26 MED ORDER — GLYCOPYRROLATE 0.2 MG/ML IJ SOLN
INTRAMUSCULAR | Status: DC | PRN
  Administered 2012-04-26: .8 mg via INTRAVENOUS

## 2012-04-26 MED ORDER — IOHEXOL 300 MG/ML  SOLN
INTRAMUSCULAR | Status: AC
  Filled 2012-04-26: qty 1

## 2012-04-26 MED ORDER — DEXAMETHASONE SODIUM PHOSPHATE 10 MG/ML IJ SOLN
INTRAMUSCULAR | Status: DC | PRN
  Administered 2012-04-26: 10 mg via INTRAVENOUS

## 2012-04-26 MED ORDER — FENTANYL CITRATE 0.05 MG/ML IJ SOLN
INTRAMUSCULAR | Status: DC | PRN
  Administered 2012-04-26 (×2): 50 ug via INTRAVENOUS
  Administered 2012-04-26: 100 ug via INTRAVENOUS

## 2012-04-26 MED ORDER — LACTATED RINGERS IV SOLN
INTRAVENOUS | Status: DC | PRN
  Administered 2012-04-26 (×2): via INTRAVENOUS

## 2012-04-26 MED ORDER — LACTATED RINGERS IR SOLN
Status: DC | PRN
  Administered 2012-04-26: 1

## 2012-04-26 MED ORDER — HYDROMORPHONE HCL PF 1 MG/ML IJ SOLN
INTRAMUSCULAR | Status: AC
  Filled 2012-04-26: qty 1

## 2012-04-26 MED ORDER — PROPOFOL 10 MG/ML IV EMUL
INTRAVENOUS | Status: DC | PRN
  Administered 2012-04-26: 140 mg via INTRAVENOUS

## 2012-04-26 MED ORDER — BUPIVACAINE-EPINEPHRINE 0.25% -1:200000 IJ SOLN
INTRAMUSCULAR | Status: DC | PRN
  Administered 2012-04-26: 26 mL

## 2012-04-26 MED ORDER — ACETAMINOPHEN 325 MG PO TABS: 650.0000 mg | ORAL_TABLET | ORAL | Status: DC | PRN

## 2012-04-26 MED ORDER — ACETAMINOPHEN 10 MG/ML IV SOLN
INTRAVENOUS | Status: AC
  Filled 2012-04-26: qty 100

## 2012-04-26 MED ORDER — HYDROMORPHONE HCL PF 1 MG/ML IJ SOLN
0.2500 mg | INTRAMUSCULAR | Status: DC | PRN
  Administered 2012-04-26 (×2): 0.5 mg via INTRAVENOUS

## 2012-04-26 MED ORDER — ACETAMINOPHEN 10 MG/ML IV SOLN
INTRAVENOUS | Status: DC | PRN
  Administered 2012-04-26: 1000 mg via INTRAVENOUS

## 2012-04-26 MED ORDER — SODIUM CHLORIDE 0.9 % IJ SOLN: 3.0000 mL | Freq: Two times a day (BID) | INTRAMUSCULAR | Status: DC

## 2012-04-26 MED ORDER — ACETAMINOPHEN 650 MG RE SUPP
650.0000 mg | RECTAL | Status: DC | PRN
  Filled 2012-04-26: qty 1

## 2012-04-26 MED ORDER — NEOSTIGMINE METHYLSULFATE 1 MG/ML IJ SOLN
INTRAMUSCULAR | Status: DC | PRN
  Administered 2012-04-26: 5 mg via INTRAVENOUS

## 2012-04-26 MED ORDER — MIDAZOLAM HCL 5 MG/5ML IJ SOLN
INTRAMUSCULAR | Status: DC | PRN
  Administered 2012-04-26: 2 mg via INTRAVENOUS

## 2012-04-26 MED ORDER — SODIUM CHLORIDE 0.9 % IV SOLN: 250.0000 mL | INTRAVENOUS | Status: DC | PRN

## 2012-04-26 MED ORDER — ONDANSETRON HCL 4 MG/2ML IJ SOLN: 4.0000 mg | Freq: Four times a day (QID) | INTRAMUSCULAR | Status: DC | PRN

## 2012-04-26 MED ORDER — BUPIVACAINE-EPINEPHRINE 0.25% -1:200000 IJ SOLN
INTRAMUSCULAR | Status: AC
  Filled 2012-04-26: qty 1

## 2012-04-26 MED ORDER — 0.9 % SODIUM CHLORIDE (POUR BTL) OPTIME
TOPICAL | Status: DC | PRN
  Administered 2012-04-26: 1000 mL

## 2012-04-26 MED ORDER — ONDANSETRON HCL 4 MG/2ML IJ SOLN
INTRAMUSCULAR | Status: DC | PRN
  Administered 2012-04-26: 4 mg via INTRAVENOUS

## 2012-04-26 SURGICAL SUPPLY — 37 items
ADH SKN CLS APL DERMABOND .7 (GAUZE/BANDAGES/DRESSINGS)
APPLIER CLIP ROT 10 11.4 M/L (STAPLE) ×2
APR CLP MED LRG 11.4X10 (STAPLE) ×1
BAG SPEC RTRVL LRG 6X4 10 (ENDOMECHANICALS) ×1
CANISTER SUCTION 2500CC (MISCELLANEOUS) ×2 IMPLANT
CATH REDDICK CHOLANGI 4FR 50CM (CATHETERS) ×2 IMPLANT
CHLORAPREP W/TINT 26ML (MISCELLANEOUS) ×1 IMPLANT
CLIP APPLIE ROT 10 11.4 M/L (STAPLE) ×1 IMPLANT
CLOTH BEACON ORANGE TIMEOUT ST (SAFETY) ×2 IMPLANT
COVER MAYO STAND STRL (DRAPES) ×2 IMPLANT
DECANTER SPIKE VIAL GLASS SM (MISCELLANEOUS) ×2 IMPLANT
DERMABOND ADVANCED (GAUZE/BANDAGES/DRESSINGS)
DERMABOND ADVANCED .7 DNX12 (GAUZE/BANDAGES/DRESSINGS) ×1 IMPLANT
DRAPE C-ARM 42X72 X-RAY (DRAPES) ×2 IMPLANT
DRAPE LAPAROSCOPIC ABDOMINAL (DRAPES) ×2 IMPLANT
DRAPE UTILITY XL STRL (DRAPES) ×2 IMPLANT
ELECT REM PT RETURN 9FT ADLT (ELECTROSURGICAL) ×2
ELECTRODE REM PT RTRN 9FT ADLT (ELECTROSURGICAL) ×1 IMPLANT
GLOVE BIO SURGEON STRL SZ7.5 (GLOVE) ×4 IMPLANT
GLOVE BIOGEL PI IND STRL 7.0 (GLOVE) ×1 IMPLANT
GLOVE BIOGEL PI INDICATOR 7.0 (GLOVE) ×1
GOWN PREVENTION PLUS XLARGE (GOWN DISPOSABLE) ×1 IMPLANT
GOWN STRL NON-REIN LRG LVL3 (GOWN DISPOSABLE) ×2 IMPLANT
GOWN STRL REIN XL XLG (GOWN DISPOSABLE) ×5 IMPLANT
HEMOSTAT SURGICEL 4X8 (HEMOSTASIS) ×1 IMPLANT
IV CATH 14GX2 1/4 (CATHETERS) ×2 IMPLANT
KIT BASIN OR (CUSTOM PROCEDURE TRAY) ×2 IMPLANT
POUCH SPECIMEN RETRIEVAL 10MM (ENDOMECHANICALS) ×1 IMPLANT
SET IRRIG TUBING LAPAROSCOPIC (IRRIGATION / IRRIGATOR) ×2 IMPLANT
SOLUTION ANTI FOG 6CC (MISCELLANEOUS) ×2 IMPLANT
SUT MNCRL AB 4-0 PS2 18 (SUTURE) ×2 IMPLANT
TOWEL OR 17X26 10 PK STRL BLUE (TOWEL DISPOSABLE) ×5 IMPLANT
TRAY LAP CHOLE (CUSTOM PROCEDURE TRAY) ×2 IMPLANT
TROCAR BLADELESS OPT 5 75 (ENDOMECHANICALS) ×4 IMPLANT
TROCAR XCEL BLUNT TIP 100MML (ENDOMECHANICALS) ×2 IMPLANT
TROCAR XCEL NON-BLD 11X100MML (ENDOMECHANICALS) ×2 IMPLANT
TUBING INSUFFLATION 10FT LAP (TUBING) ×2 IMPLANT

## 2012-04-26 NOTE — Op Note (Signed)
04/26/2012  8:32 AM  PATIENT:  Amanda Lambert  66 y.o. female  PRE-OPERATIVE DIAGNOSIS:  biliary dyskinesia  POST-OPERATIVE DIAGNOSIS:  biliary dyskinesia  PROCEDURE:  Procedure(s) (LRB): LAPAROSCOPIC CHOLECYSTECTOMY WITH INTRAOPERATIVE CHOLANGIOGRAM (N/A)  SURGEON:  Surgeon(s) and Role:    * Robyne Askew, MD - Primary    * Shelly Rubenstein, MD - Assisting  PHYSICIAN ASSISTANT:   ASSISTANTS: Dr. Magnus Ivan   ANESTHESIA:   general  EBL:  Total I/O In: 1000 [I.V.:1000] Out: -   BLOOD ADMINISTERED:none  DRAINS: none   LOCAL MEDICATIONS USED:  MARCAINE     SPECIMEN:  Source of Specimen:  gallbladder  DISPOSITION OF SPECIMEN:  PATHOLOGY  COUNTS:  YES  TOURNIQUET:  * No tourniquets in log *  DICTATION: .Dragon Dictation  Procedure: After informed consent was obtained the patient was brought to the operating room and placed in the supine position on the operating room table. After adequate induction of general anesthesia the patient's abdomen was prepped with ChloraPrep allowed to dry and draped in usual sterile manner. The area below the umbilicus was infiltrated with quarter percent  Marcaine. A small incision was made with a 15 blade knife. The incision was carried down through the subcutaneous tissue bluntly with a hemostat and [employer] retractors. The linea alba was identified. The linea alba was incised with a 15 blade knife and each side was grasped with Coker clamps. The preperitoneal space was then probed with a hemostat until the peritoneum was opened and access was gained to the abdominal cavity. A 0 Vicryl pursestring stitch was placed in the fascia surrounding the opening. A Hassan cannula was then placed through the opening and anchored in place with the previously placed Vicryl purse string stitch. The abdomen was insufflated with carbon dioxide without difficulty. A laparoscope was inserted through the [facility] cannula in the right upper quadrant was inspected.  Next the epigastric region was infiltrated with % Marcaine. A small incision was made with a 15 blade knife. A 10 mm port was placed bluntly through this incision into the abdominal cavity under direct vision. Next 2 sites were chosen laterally on the right side of the abdomen for placement of 5 mm ports. Each of these areas was infiltrated with quarter percent Marcaine. Small stab incisions were made with a 15 blade knife. 5 mm ports were then placed bluntly through these incisions into the abdominal cavity under direct vision without difficulty. A blunt grasper was placed through the lateralmost 5 mm port and used to grasp the dome of the gallbladder and elevated anteriorly and superiorly. Another blunt grasper was placed through the other 5 mm port and used to retract the body and neck of the gallbladder. A dissector was placed through the epigastric port and using the electrocautery the peritoneal reflection at the gallbladder neck was opened. Blunt dissection was then carried out in this area until the gallbladder neck-cystic duct junction was readily identified and a good window was created. A single clip was placed on the gallbladder neck. A small  ductotomy was made just below the clip with laparoscopic scissors. A 14-gauge Angiocath was then placed through the anterior abdominal wall under direct vision. A Reddick cholangiogram catheter was then placed through the Angiocath and flushed. The catheter was then placed in the cystic duct and anchored in place with a clip. A cholangiogram was obtained that showed no filling defects good emptying into the duodenum an adequate length on the cystic duct. The anchoring clip  and catheters were then removed from the patient. 3 clips were placed proximally on the cystic duct and the duct was divided between the 2 sets of clips. Posterior to this the cystic artery was identified and again dissected bluntly in a circumferential manner until a good window  was created. 2  clips were placed proximally and one distally on the artery and the artery was divided between the 2 sets of clips. Next a laparoscopic hook cautery device was used to separate the gallbladder from the liver bed. Prior to completely detaching the gallbladder from the liver bed the liver bed was inspected and several small bleeding points were coagulated with the electrocautery until the area was completely hemostatic. The gallbladder was then detached the rest of it from the liver bed without difficulty. A laparoscopic bag was inserted through the epigastric port. The gallbladder was placed within the bag and the bag was sealed. A laparoscope was then moved to the epigastric port. The gallbladder grasper was placed through the [facility] cannula and used to grasp the opening of the bag. The bag with the gallbladder was then removed with the [facility] cannula through the infraumbilical port without difficulty. The fascial defect was then closed with the previously placed Vicryl pursestring stitch as well as with another figure-of-eight 0 Vicryl stitch. The liver bed was inspected again and found to be hemostatic. The abdomen was irrigated with copious amounts of saline until the effluent was clear. The ports were then removed under direct vision without difficulty and were found to be hemostatic. The gas was allowed to escape. The skin incisions were all closed with interrupted 4-0 Monocryl subcuticular stitches. Dermabond dressings were applied. The patient tolerated the procedure well. At the end of the case all needle sponge and instrument counts were correct. The patient was then awakened and taken to recovery in stable condition   PLAN OF CARE: Discharge to home after PACU  PATIENT DISPOSITION:  PACU - hemodynamically stable.   Delay start of Pharmacological VTE agent (>24hrs) due to surgical blood loss or risk of bleeding: not applicable

## 2012-04-26 NOTE — Anesthesia Postprocedure Evaluation (Signed)
  Anesthesia Post-op Note  Patient: Amanda Lambert  Procedure(s) Performed: Procedure(s) (LRB): LAPAROSCOPIC CHOLECYSTECTOMY WITH INTRAOPERATIVE CHOLANGIOGRAM (N/A)  Patient Location: PACU  Anesthesia Type: General  Level of Consciousness: oriented and sedated  Airway and Oxygen Therapy: Patient Spontanous Breathing and Patient connected to nasal cannula oxygen  Post-op Pain: mild  Post-op Assessment: Post-op Vital signs reviewed, Patient's Cardiovascular Status Stable, Respiratory Function Stable and Patent Airway  Post-op Vital Signs: stable  Complications: No apparent anesthesia complications

## 2012-04-26 NOTE — Transfer of Care (Signed)
Immediate Anesthesia Transfer of Care Note  Patient: Amanda Lambert  Procedure(s) Performed: Procedure(s) (LRB): LAPAROSCOPIC CHOLECYSTECTOMY WITH INTRAOPERATIVE CHOLANGIOGRAM (N/A)  Patient Location: PACU  Anesthesia Type: General  Level of Consciousness: awake, sedated and patient cooperative  Airway & Oxygen Therapy: Patient Spontanous Breathing and Patient connected to face mask oxygen  Post-op Assessment: Report given to PACU RN and Post -op Vital signs reviewed and stable  Post vital signs: Reviewed and stable  Complications: No apparent anesthesia complications

## 2012-04-26 NOTE — Progress Notes (Signed)
Pt up in room and ambulated in short stay area.  Tolerated well.  Pt has no complaints.

## 2012-04-26 NOTE — Interval H&P Note (Signed)
History and Physical Interval Note:  04/26/2012 7:13 AM  Amanda Lambert  has presented today for surgery, with the diagnosis of biliary dyskinesia  The various methods of treatment have been discussed with the patient and family. After consideration of risks, benefits and other options for treatment, the patient has consented to  Procedure(s) (LRB): LAPAROSCOPIC CHOLECYSTECTOMY WITH INTRAOPERATIVE CHOLANGIOGRAM (N/A) as a surgical intervention .  The patient's history has been reviewed, patient examined, no change in status, stable for surgery.  I have reviewed the patient's chart and labs.  Questions were answered to the patient's satisfaction.     TOTH III, S

## 2012-04-26 NOTE — Anesthesia Preprocedure Evaluation (Signed)
Anesthesia Evaluation  Patient identified by MRN, date of birth, ID band Patient awake  General Assessment Comment:Infection precautions  Reviewed: Allergy & Precautions, H&P , NPO status , Patient's Chart, lab work & pertinent test results, reviewed documented beta blocker date and time   History of Anesthesia Complications (+) PONV  Airway Mallampati: II TM Distance: >3 FB Neck ROM: Full    Dental  (+) Teeth Intact and Dental Advisory Given   Pulmonary neg pulmonary ROS,  breath sounds clear to auscultation        Cardiovascular hypertension, Pt. on medications Rhythm:Regular Rate:Normal  Denies cardiac symptoms   Neuro/Psych negative neurological ROS  negative psych ROS   GI/Hepatic (+) Hepatitis -, BBiliary dyskenesia   Endo/Other  negative endocrine ROS  Renal/GU negative Renal ROS  negative genitourinary   Musculoskeletal negative musculoskeletal ROS (+)   Abdominal   Peds negative pediatric ROS (+)  Hematology negative hematology ROS (+)   Anesthesia Other Findings   Reproductive/Obstetrics negative OB ROS                           Anesthesia Physical Anesthesia Plan  ASA: II  Anesthesia Plan: General   Post-op Pain Management:    Induction: Intravenous  Airway Management Planned: Oral ETT  Additional Equipment:   Intra-op Plan:   Post-operative Plan: Extubation in OR  Informed Consent:   Dental advisory given  Plan Discussed with: CRNA and Surgeon  Anesthesia Plan Comments:         Anesthesia Quick Evaluation

## 2012-04-26 NOTE — H&P (View-Only) (Signed)
Subjective:     Patient ID: Amanda Lambert, female   DOB: 06/24/1946, 66 y.o.   MRN: [id number]  HPI We are asked to see the patient in consultation by Dr. Noble Gates to evaluate her for biliary dyskinesia. The patient is a 66-year-old white female who was recently hospitalized with flulike symptoms. She was having upper abdominal discomfort. She was diagnosed with recommended spotted fever. Because of this her liver functions have been elevated. During her workup she was also having diarrhea and some upper abdominal pain. She was evaluated with a HIDA scan that did show a significantly reduced ejection fraction. Her ultrasound did not show any gallstones or inflammation. In retrospect she feels as though she's been having some diarrhea off and on for years with some occasional right upper quadrant discomfort.  Review of Systems  Constitutional: Negative.   HENT: Negative.   Eyes: Negative.   Respiratory: Negative.   Cardiovascular: Negative.   Gastrointestinal: Negative.   Genitourinary: Negative.   Musculoskeletal: Negative.   Skin: Negative.   Neurological: Negative.   Hematological: Negative.   Psychiatric/Behavioral: Negative.        Objective:   Physical Exam  Constitutional: She is oriented to person, place, and time. She appears well-developed and well-nourished.  HENT:  Head: Normocephalic and atraumatic.  Eyes: Conjunctivae and EOM are normal. Pupils are equal, round, and reactive to light.  Neck: Normal range of motion. Neck supple.  Cardiovascular: Normal rate, regular rhythm and normal heart sounds.   Pulmonary/Chest: Effort normal and breath sounds normal.  Abdominal: Soft. Bowel sounds are normal.       Mild epigastric and right upper quadrant pain. No palpable mass  Musculoskeletal: Normal range of motion.  Neurological: She is alert and oriented to person, place, and time.  Skin: Skin is warm and dry.  Psychiatric: She has a normal mood and affect. Her behavior is  normal.       Assessment:     The patient has what appears to be symptomatic biliary dyskinesia. This seems to be complicated also by [place] spotted fever. I feel as though there is a very good chance that removing her gallbladder may improve her symptoms but she understands that I cannot guarantee it. I have discussed with her in detail the risks and benefits of the operation to remove the gallbladder as well as some of the technical aspects and she understands and wishes to proceed. I would agree with this.    Plan:     Plan for laparoscopic cholecystectomy with intraoperative cholangiogram      

## 2012-04-29 ENCOUNTER — Encounter (HOSPITAL_COMMUNITY): Payer: Self-pay | Admitting: General Surgery

## 2012-05-07 ENCOUNTER — Encounter (INDEPENDENT_AMBULATORY_CARE_PROVIDER_SITE_OTHER): Payer: Self-pay | Admitting: General Surgery

## 2012-05-07 ENCOUNTER — Telehealth (INDEPENDENT_AMBULATORY_CARE_PROVIDER_SITE_OTHER): Payer: Self-pay | Admitting: General Surgery

## 2012-05-07 NOTE — Telephone Encounter (Signed)
Spoke with patient and informed her that her RTW note is up at the front desk and is ready to be picked up.

## 2012-05-07 NOTE — Telephone Encounter (Signed)
Message copied by Littie Deeds on Tue May 07, 2012  2:33 PM ------      Message from: Marchia Bond      Created: Tue May 07, 2012 10:59 AM      Regarding: NEEDS WORK NOTE      Contact: [phone]       DR TOTH PT CALLED AND WOULD LIKE A WORK NOT TO GO BACK TO WORK SHE FEELS LIKE SHE CAN CALL HER BACK AT [phone]

## 2012-05-21 ENCOUNTER — Ambulatory Visit (INDEPENDENT_AMBULATORY_CARE_PROVIDER_SITE_OTHER): Payer: Commercial Managed Care - PPO | Admitting: General Surgery

## 2012-05-21 ENCOUNTER — Encounter (INDEPENDENT_AMBULATORY_CARE_PROVIDER_SITE_OTHER): Payer: Self-pay | Admitting: General Surgery

## 2012-05-21 VITALS — BP 140/82 | HR 73 | Temp 97.5°F | Resp 20 | Ht 68.0 in | Wt 204.6 lb

## 2012-05-21 DIAGNOSIS — K828 Other specified diseases of gallbladder: Secondary | ICD-10-CM

## 2012-05-21 NOTE — Progress Notes (Signed)
Subjective:     Patient ID: Amanda Lambert, female   DOB: 12-14-1945, 66 y.o.   MRN: [id number]  HPI The patient is a 66 year old white female who is about 3 weeks status post laparoscopic cholecystectomy. She has done very well since surgery. She feels as though she may be better than she was prior to surgery. Her appetite is good and her bowels are working normally.  Review of Systems     Objective:   Physical Exam On exam her abdomen is soft and nontender. Her incisions are healing nicely with no sign of infection.    Assessment:     3 weeks status post laparoscopic cholecystectomy    Plan:     At this point I believe she can return to her normal activities. We will plan to see her back on a prn basis.

## 2012-05-21 NOTE — Patient Instructions (Signed)
May return to all normal activities 

## 2014-04-04 ENCOUNTER — Emergency Department (HOSPITAL_COMMUNITY)
Admission: EM | Admit: 2014-04-04 | Discharge: 2014-04-04 | Disposition: A | Discharge status: Home / Self Care | Payer: 59 | Attending: Emergency Medicine | Admitting: Emergency Medicine

## 2014-04-04 ENCOUNTER — Encounter (HOSPITAL_COMMUNITY): Payer: Self-pay | Admitting: Emergency Medicine

## 2014-04-04 ENCOUNTER — Emergency Department (HOSPITAL_COMMUNITY): Payer: 59

## 2014-04-04 DIAGNOSIS — Z8619 Personal history of other infectious and parasitic diseases: Secondary | ICD-10-CM | POA: Insufficient documentation

## 2014-04-04 DIAGNOSIS — K219 Gastro-esophageal reflux disease without esophagitis: Secondary | ICD-10-CM | POA: Insufficient documentation

## 2014-04-04 DIAGNOSIS — J40 Bronchitis, not specified as acute or chronic: Secondary | ICD-10-CM | POA: Insufficient documentation

## 2014-04-04 DIAGNOSIS — I10 Essential (primary) hypertension: Secondary | ICD-10-CM | POA: Insufficient documentation

## 2014-04-04 DIAGNOSIS — Z79899 Other long term (current) drug therapy: Secondary | ICD-10-CM | POA: Insufficient documentation

## 2014-04-04 DIAGNOSIS — M19029 Primary osteoarthritis, unspecified elbow: Secondary | ICD-10-CM | POA: Insufficient documentation

## 2014-04-04 LAB — BASIC METABOLIC PANEL
ANION GAP: 18 — AB (ref 5–15)
BUN: 12 mg/dL (ref 6–23)
CO2: 21 meq/L (ref 19–32)
CREATININE: 0.82 mg/dL (ref 0.50–1.10)
Calcium: 9.2 mg/dL (ref 8.4–10.5)
Chloride: 100 mEq/L (ref 96–112)
GFR calc Af Amer: 84 mL/min — ABNORMAL LOW (ref >90)
GFR calc non Af Amer: 72 mL/min — ABNORMAL LOW (ref >90)
GLUCOSE: 114 mg/dL — AB (ref 70–99)
Potassium: 3.8 mEq/L (ref 3.7–5.3)
SODIUM: 139 meq/L (ref 137–147)

## 2014-04-04 LAB — CBC
HCT: 39.6 % (ref 36.0–46.0)
HEMOGLOBIN: 13.1 g/dL (ref 12.0–15.0)
MCH: 28.5 pg (ref 26.0–34.0)
MCHC: 33.1 g/dL (ref 30.0–36.0)
MCV: 86.1 fL (ref 78.0–100.0)
Platelets: 300 10*3/uL (ref 150–400)
RBC: 4.6 MIL/uL (ref 3.87–5.11)
RDW: 12.6 % (ref 11.5–15.5)
WBC: 8 10*3/uL (ref 4.0–10.5)

## 2014-04-04 LAB — I-STAT TROPONIN, ED
TROPONIN I, POC: 0.01 ng/mL (ref 0.00–0.08)
Troponin i, poc: 0 ng/mL (ref 0.00–0.08)

## 2014-04-04 LAB — PRO B NATRIURETIC PEPTIDE: Pro B Natriuretic peptide (BNP): 207.6 pg/mL — ABNORMAL HIGH (ref 0–125)

## 2014-04-04 MED ORDER — SODIUM CHLORIDE 0.9 % IJ SOLN
INTRAMUSCULAR | Status: AC
  Filled 2014-04-04: qty 150

## 2014-04-04 MED ORDER — SODIUM CHLORIDE 0.9 % IV BOLUS (SEPSIS)
1000.0000 mL | Freq: Once | INTRAVENOUS | Status: AC
  Administered 2014-04-04: 1000 mL via INTRAVENOUS

## 2014-04-04 MED ORDER — IOHEXOL 350 MG/ML SOLN
80.0000 mL | Freq: Once | INTRAVENOUS | Status: AC | PRN
  Administered 2014-04-04: 80 mL via INTRAVENOUS

## 2014-04-04 NOTE — ED Provider Notes (Signed)
Medical screening examination/treatment/procedure(s) were conducted as a shared visit with non-physician practitioner(s) and myself.  I personally evaluated the patient during the encounter.   EKG Interpretation   Date/Time:  Saturday April 04 2014 05:19:17 EDT Ventricular Rate:  93 PR Interval:  198 QRS Duration: 132 QT Interval:  392 QTC Calculation: 487 R Axis:   -67 Text Interpretation:  Normal sinus rhythm Left axis deviation Left bundle  branch block Abnormal ECG Confirmed by OTTER  MD, OLGA ([id number]) on  04/04/2014 5:26:15 AM      Patient here with SOB, cough. No CP. Normal CXR and labs. Persistently dropping her sats to 90%. CT PE study negative. Stable for discharge.  Osvaldo Shipper, MD 04/04/14 [phone]

## 2014-04-04 NOTE — Discharge Instructions (Signed)
Call for a follow up appointment with a Family or Primary Care Provider.  °Return if Symptoms worsen.   °Take medication as prescribed.  ° °

## 2014-04-04 NOTE — ED Provider Notes (Signed)
CSN: [id number]     Arrival date & time 04/04/14  0510 History   First MD Initiated Contact with Patient 04/04/14 0631     Chief Complaint  Patient presents with  . Shortness of Breath     (Consider location/radiation/quality/duration/timing/severity/associated sxs/prior Treatment) HPI Comments: The patient is a 68 year old female presents emergency room chief complaint of cough for several days worsened last night. The patient reports frothy sputum, states associated shortness of breath since approximately midnight last night. She reports she was unable to sleep due 2 coughing and shortness of breath. She reports chest "tightness", patient self attributes to coughing. She denies palpitations, lower extremity swelling. She reports upper chest/throat "crackling". She reports that a clean catheterization several years ago, last stress over 5 years ago. No home oxygen use.  Denies other URI symptoms. PCP: Henrine Screws, MD  The history is provided by the patient. No language interpreter was used.    Past Medical History  Diagnosis Date  . Hypertension   . Biliary dyskinesia   . Constipation   . Nausea   . Weakness   . PONV (postoperative nausea and vomiting)     slow to wake up  . [place] spotted fever 04/01/2012    adm. to hospital  . GERD (gastroesophageal reflux disease)   . Arthritis     right elbow  . Hepatitis B 1975   Past Surgical History  Procedure Laterality Date  . Appendectomy  1963  . Tonsillectomy  1964  - approximate  . Dilation and curettage, diagnostic / therapeutic  1972  . Wrist fracture surgery  2008    right  . Dilation and curettage of uterus  1982    for miscarriage  . Cholecystectomy  04/26/2012    Procedure: LAPAROSCOPIC CHOLECYSTECTOMY WITH INTRAOPERATIVE CHOLANGIOGRAM;  Surgeon: Merrie Roof, MD;  Location: [facility];  Service: General;  Laterality: N/A;   Family History  Problem Relation Age of Onset  . Cardiomyopathy Mother   .  Coronary artery disease Father    History  Substance Use Topics  . Smoking status: Never Smoker   . Smokeless tobacco: Never Used  . Alcohol Use: No   OB History   Grav Para Term Preterm Abortions TAB SAB Ect Mult Living                 Review of Systems  Constitutional: Negative for fever and chills.  HENT: Negative for congestion, sinus pressure and sore throat.   Respiratory: Positive for cough, chest tightness and shortness of breath. Negative for wheezing.   Cardiovascular: Negative for chest pain, palpitations and leg swelling.  Gastrointestinal: Negative for abdominal pain.      Allergies  Thorazine; Ace inhibitors; and Compazine  Home Medications   Prior to Admission medications   Medication Sig Start Date End Date Taking? Authorizing Provider  acetaminophen (TYLENOL) 325 MG tablet Take 650 mg by mouth every 6 (six) hours as needed. For pain   Yes Historical Provider, MD  cholestyramine Lucrezia Starch) 4 G packet Take 4 g by mouth daily.   Yes Historical Provider, MD  metoprolol (LOPRESSOR) 50 MG tablet Take 25 mg by mouth 2 (two) times daily.    Yes Historical Provider, MD  pantoprazole (PROTONIX) 40 MG tablet Take 40 mg by mouth daily.   Yes Historical Provider, MD  triamterene-hydrochlorothiazide (MAXZIDE) 75-50 MG per tablet Take 1 tablet by mouth every morning.    Yes Historical Provider, MD   BP 127/71  Pulse 78  Temp(Src) 98.1 F (36.7 C) (Oral)  Resp 13  Ht 5' 7.5" (1.715 m)  Wt 200 lb (90.719 kg)  BMI 30.84 kg/m2  SpO2 98% Physical Exam  Nursing note and vitals reviewed. Constitutional: She is oriented to person, place, and time. She appears well-developed and well-nourished.  Non-toxic appearance. She does not have a sickly appearance. She does not appear ill. No distress.  HENT:  Head: Normocephalic and atraumatic.  Mouth/Throat: No oropharyngeal exudate.  Eyes: EOM are normal.  Neck: Neck supple. No tracheal deviation present.  Cardiovascular:  Normal rate and regular rhythm.   No lower extremity edema  Pulmonary/Chest: Effort normal and breath sounds normal. No stridor. Not tachypneic. No respiratory distress. She has no decreased breath sounds. She has no wheezes. She has no rhonchi. She has no rales.  Patient is able to speak in complete sentences.   Abdominal: Soft. There is no tenderness.  Musculoskeletal: Normal range of motion.  Lymphadenopathy:       Head (right side): No submental, no submandibular, no tonsillar and no preauricular adenopathy present.       Head (left side): No submental, no submandibular, no tonsillar and no preauricular adenopathy present.    She has no axillary adenopathy.  Neurological: She is alert and oriented to person, place, and time.  Skin: Skin is warm and dry. She is not diaphoretic.  Psychiatric: She has a normal mood and affect. Her behavior is normal.    ED Course  Procedures (including critical care time) Labs Review Results for orders placed during the hospital encounter of 04/04/14  CBC      Result Value Ref Range   WBC 8.0  4.0 - 10.5 K/uL   RBC 4.60  3.87 - 5.11 MIL/uL   Hemoglobin 13.1  12.0 - 15.0 g/dL   HCT 39.6  36.0 - 46.0 %   MCV 86.1  78.0 - 100.0 fL   MCH 28.5  26.0 - 34.0 pg   MCHC 33.1  30.0 - 36.0 g/dL   RDW 12.6  11.5 - 15.5 %   Platelets 300  150 - 400 K/uL  BASIC METABOLIC PANEL      Result Value Ref Range   Sodium 139  137 - 147 mEq/L   Potassium 3.8  3.7 - 5.3 mEq/L   Chloride 100  96 - 112 mEq/L   CO2 21  19 - 32 mEq/L   Glucose, Bld 114 (*) 70 - 99 mg/dL   BUN 12  6 - 23 mg/dL   Creatinine, Ser 0.82  0.50 - 1.10 mg/dL   Calcium 9.2  8.4 - 10.5 mg/dL   GFR calc non Af Amer 72 (*) >90 mL/min   GFR calc Af Amer 84 (*) >90 mL/min   Anion gap 18 (*) 5 - 15  PRO B NATRIURETIC PEPTIDE      Result Value Ref Range   Pro B Natriuretic peptide (BNP) 207.6 (*) 0 - 125 pg/mL  I-STAT TROPOININ, ED      Result Value Ref Range   Troponin i, poc 0.00  0.00 - 0.08  ng/mL   Comment 3           I-STAT TROPOININ, ED      Result Value Ref Range   Troponin i, poc 0.01  0.00 - 0.08 ng/mL   Comment 3            Dg Chest 2 View  04/04/2014   CLINICAL DATA:  Cough and upper airway congestion.  Foamy sputum. Chest pressure. Wheezing. Shortness of breath.  EXAM: CHEST  2 VIEW  COMPARISON:  04/02/2012  FINDINGS: The heart size and mediastinal contours are within normal limits. Both lungs are clear. The visualized skeletal structures are unremarkable.  IMPRESSION: No active cardiopulmonary disease.   Electronically Signed   By: Lucienne Capers M.D.   On: 04/04/2014 05:52   Ct Angio Chest Pe W/cm &/or Wo Cm  04/04/2014   CLINICAL DATA:  Chest pain and wheezing. Concern pulmonary embolism.  EXAM: CT ANGIOGRAPHY CHEST WITH CONTRAST  TECHNIQUE: Multidetector CT imaging of the chest was performed using the standard protocol during bolus administration of intravenous contrast. Multiplanar CT image reconstructions and MIPs were obtained to evaluate the vascular anatomy.  CONTRAST:  23mL OMNIPAQUE IOHEXOL 350 MG/ML SOLN  COMPARISON:  Chest radiograph 04/04/2014  FINDINGS: There are no filling defects within the pulmonary arteries to suggest acute pulmonary embolism. No acute findings aorta great vessels. No pericardial fluid.  Review of the lung parenchyma demonstrates ground-glass opacities in the upper lobes and lower lobes. Airways are normal. No evidence of infiltrate, pleural fluid, or pneumothorax. There is mild peribronchovascular thickening extending into the lower lobes seen on image 46 and 52 of series 401. This thickening is circumferential and does not appear to be adenopathy.  No axillary or supraclavicular lymphadenopathy. No mediastinal hilar lymphadenopathy. Limited view of the upper abdomen is unremarkable. Limited view of the skeleton is unremarkable.  Review of the MIP images confirms the above findings.  IMPRESSION: 1. No evidence of acute pulmonary embolism. 2.  Diffuse ground-glass opacities suggests mild pulmonary edema or atelectasis. 3. Peribronchial vascular thickening extending from the hilum into the lower lobes is nonspecific finding. This could relate to bronchiolitis or potentially granulomatous disease (sarcoidosis).   Electronically Signed   By: Suzy Bouchard M.D.   On: 04/04/2014 09:58    EKG Interpretation   Date/Time:  Saturday April 04 2014 05:19:17 EDT Ventricular Rate:  93 PR Interval:  198 QRS Duration: 132 QT Interval:  392 QTC Calculation: 487 R Axis:   -67 Text Interpretation:  Normal sinus rhythm Left axis deviation Left bundle  branch block Abnormal ECG Confirmed by OTTER  MD, OLGA ([id number]) on  04/04/2014 5:26:15 AM      MDM   Final diagnoses:  Bronchitis   Patient presents with cough and shortness of breath. 93% on room air no other objective findings on physical exam, will ambulate the patient in the ED and check a pulse ox. EKG unchanged from previous, chest x-ray without acute findings. Patient's oxygen saturation dropped to 90% with ambulation. There is a documented SpO2 89%, discussed with the R.N. does not know about waveform. Reevaluation patient denies dyspnea discussed oxygen saturation and need for further evaluation. Plan to CT chest to rule out PE. Dr. Mingo Amber also evaluated the patient in the ED. CT negative for PE shows likely bronchitis. Discussed lab results, imaging results, and treatment plan with the patient. Return precautions given. Reports understanding and no other concerns at this time.  Patient is stable for discharge at this time.  Meds given in ED:  Medications  sodium chloride 0.9 % bolus 1,000 mL (0 mLs Intravenous Stopped 04/04/14 1100)  iohexol (OMNIPAQUE) 350 MG/ML injection 80 mL (80 mLs Intravenous Contrast Given 04/04/14 0925)    New Prescriptions   No medications on file      Lorrine Kin, PA-C 04/04/14 1517

## 2014-04-04 NOTE — ED Notes (Signed)
02 Stat ranged from 95 to 90 while ambulating,

## 2014-04-04 NOTE — ED Notes (Addendum)
[place]: upper airway congestion; foamy sputum, no hx. Of chf. Chest pressure, tightness. Some wheezing at onset.

## 2014-06-15 ENCOUNTER — Other Ambulatory Visit (HOSPITAL_COMMUNITY): Payer: Self-pay | Admitting: *Deleted

## 2014-06-15 ENCOUNTER — Ambulatory Visit (HOSPITAL_COMMUNITY)
Admission: RE | Admit: 2014-06-15 | Discharge: 2014-06-15 | Disposition: A | Discharge status: Home / Self Care | Payer: 59 | Source: Ambulatory Visit | Attending: Orthopedic Surgery | Admitting: Orthopedic Surgery

## 2014-06-15 DIAGNOSIS — M79604 Pain in right leg: Secondary | ICD-10-CM

## 2014-06-15 DIAGNOSIS — M79609 Pain in unspecified limb: Secondary | ICD-10-CM | POA: Insufficient documentation

## 2014-06-15 NOTE — Progress Notes (Signed)
*  PRELIMINARY RESULTS* Vascular Ultrasound Right lower extremity venous duplex has been completed.  Preliminary findings: Right:  No evidence of DVT, superficial thrombosis, or Baker's cyst.  Called results to [place] [address] PA.  Landry Mellow, RDMS, RVT  06/15/2014, 11:10 AM

## 2015-05-27 ENCOUNTER — Other Ambulatory Visit: Payer: Self-pay

## 2015-05-28 LAB — CYTOLOGY - PAP

## 2015-11-01 MED FILL — TRIAMTERENE-HCTZ 37.5-25 MG: 37.5-25 | 90 days supply | Qty: 90 | Fill #0

## 2015-11-01 MED FILL — PANTOPRAZOLE SOD DR 40 MG T: 40 | 90 days supply | Qty: 90 | Fill #0

## 2015-11-15 MED FILL — METOPROLOL TARTRATE 50 MG T: 50 | 90 days supply | Qty: 90 | Fill #0

## 2015-12-10 DIAGNOSIS — J111 Influenza due to unidentified influenza virus with other respiratory manifestations: Secondary | ICD-10-CM | POA: Diagnosis not present

## 2015-12-10 DIAGNOSIS — R05 Cough: Secondary | ICD-10-CM | POA: Diagnosis not present

## 2015-12-10 DIAGNOSIS — J101 Influenza due to other identified influenza virus with other respiratory manifestations: Secondary | ICD-10-CM | POA: Diagnosis not present

## 2015-12-10 MED FILL — HYDROCODONE-HOMATROPINE SYR: 5-1.5 | 4 days supply | Qty: 120 | Fill #0

## 2015-12-10 MED FILL — OSELTAMIVIR PHOS 75 MG CAP: 75 | 5 days supply | Qty: 10 | Fill #0

## 2016-01-14 DIAGNOSIS — I10 Essential (primary) hypertension: Secondary | ICD-10-CM | POA: Diagnosis not present

## 2016-01-14 DIAGNOSIS — R002 Palpitations: Secondary | ICD-10-CM | POA: Diagnosis not present

## 2016-01-14 DIAGNOSIS — T63441A Toxic effect of venom of bees, accidental (unintentional), initial encounter: Secondary | ICD-10-CM | POA: Diagnosis not present

## 2016-01-14 DIAGNOSIS — K219 Gastro-esophageal reflux disease without esophagitis: Secondary | ICD-10-CM | POA: Diagnosis not present

## 2016-01-14 DIAGNOSIS — J309 Allergic rhinitis, unspecified: Secondary | ICD-10-CM | POA: Diagnosis not present

## 2016-01-14 DIAGNOSIS — M779 Enthesopathy, unspecified: Secondary | ICD-10-CM | POA: Diagnosis not present

## 2016-01-14 MED FILL — predniSONE 5 MG TABS: 5 | 7 days supply | Qty: 28 | Fill #0

## 2016-01-14 MED FILL — EPINEPHRINE 0.3 MG AUTO-INJ: 0.3 | 25 days supply | Qty: 2 | Fill #0

## 2016-01-25 MED FILL — TRIAMTERENE-HCTZ 37.5-25 MG: 37.5-25 | 90 days supply | Qty: 90 | Fill #1

## 2016-02-09 MED FILL — PANTOPRAZOLE SOD DR 40 MG T: 40 | 90 days supply | Qty: 90 | Fill #1

## 2016-03-01 MED FILL — METOPROLOL TARTRATE 50 MG T: 50 | 90 days supply | Qty: 90 | Fill #1

## 2016-03-29 DIAGNOSIS — M5432 Sciatica, left side: Secondary | ICD-10-CM | POA: Diagnosis not present

## 2016-03-29 MED FILL — predniSONE 10 MG TABS: 10 | 6 days supply | Qty: 21 | Fill #0

## 2016-04-24 DIAGNOSIS — S92504A Nondisplaced unspecified fracture of right lesser toe(s), initial encounter for closed fracture: Secondary | ICD-10-CM | POA: Diagnosis not present

## 2016-04-25 DIAGNOSIS — Z0100 Encounter for examination of eyes and vision without abnormal findings: Secondary | ICD-10-CM | POA: Diagnosis not present

## 2016-04-26 MED FILL — TRIAMTERENE/HCTZ 37.5/25 TB: 37.5-25 | 90 days supply | Qty: 90 | Fill #2

## 2016-05-15 MED FILL — PANTOPRAZOLE SOD DR 40 MG T: 40 | 90 days supply | Qty: 90 | Fill #2

## 2016-06-05 MED FILL — METOPROLOL TARTRATE 50 MG T: 50 | 90 days supply | Qty: 90 | Fill #0

## 2016-07-27 MED FILL — TRIAMTERENE-HCTZ 37.5-25 MG: 37.5-25 | 90 days supply | Qty: 90 | Fill #0

## 2016-07-31 DIAGNOSIS — M545 Low back pain: Secondary | ICD-10-CM | POA: Diagnosis not present

## 2016-07-31 DIAGNOSIS — M7061 Trochanteric bursitis, right hip: Secondary | ICD-10-CM | POA: Diagnosis not present

## 2016-07-31 MED FILL — predniSONE 10 MG TABS: 10 | 12 days supply | Qty: 48 | Fill #0

## 2016-08-16 MED FILL — PANTOPRAZOLE SOD DR 40 MG T: 40 | 90 days supply | Qty: 90 | Fill #0

## 2016-08-28 ENCOUNTER — Emergency Department (HOSPITAL_COMMUNITY): Payer: 59

## 2016-08-28 ENCOUNTER — Encounter (HOSPITAL_COMMUNITY): Payer: Self-pay

## 2016-08-28 ENCOUNTER — Emergency Department (HOSPITAL_COMMUNITY)
Admission: EM | Admit: 2016-08-28 | Discharge: 2016-08-28 | Disposition: A | Discharge status: Home / Self Care | Payer: 59 | Attending: Emergency Medicine | Admitting: Emergency Medicine

## 2016-08-28 DIAGNOSIS — Z79899 Other long term (current) drug therapy: Secondary | ICD-10-CM | POA: Insufficient documentation

## 2016-08-28 DIAGNOSIS — Z91040 Latex allergy status: Secondary | ICD-10-CM | POA: Insufficient documentation

## 2016-08-28 DIAGNOSIS — J189 Pneumonia, unspecified organism: Secondary | ICD-10-CM | POA: Insufficient documentation

## 2016-08-28 DIAGNOSIS — J984 Other disorders of lung: Secondary | ICD-10-CM | POA: Diagnosis not present

## 2016-08-28 DIAGNOSIS — I10 Essential (primary) hypertension: Secondary | ICD-10-CM | POA: Diagnosis not present

## 2016-08-28 DIAGNOSIS — I499 Cardiac arrhythmia, unspecified: Secondary | ICD-10-CM | POA: Diagnosis not present

## 2016-08-28 DIAGNOSIS — J181 Lobar pneumonia, unspecified organism: Secondary | ICD-10-CM

## 2016-08-28 DIAGNOSIS — R0602 Shortness of breath: Secondary | ICD-10-CM | POA: Diagnosis not present

## 2016-08-28 LAB — I-STAT TROPONIN, ED: TROPONIN I, POC: 0.01 ng/mL (ref 0.00–0.08)

## 2016-08-28 LAB — URINALYSIS, ROUTINE W REFLEX MICROSCOPIC
Bilirubin Urine: NEGATIVE
GLUCOSE, UA: NEGATIVE mg/dL
Hgb urine dipstick: NEGATIVE
Ketones, ur: NEGATIVE mg/dL
Nitrite: NEGATIVE
Protein, ur: NEGATIVE mg/dL
Specific Gravity, Urine: 1.009 (ref 1.005–1.030)
pH: 7 (ref 5.0–8.0)

## 2016-08-28 LAB — D-DIMER, QUANTITATIVE: D-Dimer, Quant: 0.42 ug/mL-FEU (ref 0.00–0.50)

## 2016-08-28 LAB — BASIC METABOLIC PANEL
Anion gap: 8 (ref 5–15)
BUN: 10 mg/dL (ref 6–20)
CALCIUM: 9.3 mg/dL (ref 8.9–10.3)
CO2: 28 mmol/L (ref 22–32)
CREATININE: 0.98 mg/dL (ref 0.44–1.00)
Chloride: 103 mmol/L (ref 101–111)
GFR calc Af Amer: >60 mL/min (ref >60)
GFR, EST NON AFRICAN AMERICAN: 58 mL/min — AB (ref >60)
GLUCOSE: 91 mg/dL (ref 65–99)
Potassium: 3.9 mmol/L (ref 3.5–5.1)
Sodium: 139 mmol/L (ref 135–145)

## 2016-08-28 LAB — URINE MICROSCOPIC-ADD ON: RBC / HPF: NONE SEEN RBC/hpf (ref 0–5)

## 2016-08-28 LAB — CBC
HCT: 38.7 % (ref 36.0–46.0)
Hemoglobin: 12.5 g/dL (ref 12.0–15.0)
MCH: 28.5 pg (ref 26.0–34.0)
MCHC: 32.3 g/dL (ref 30.0–36.0)
MCV: 88.2 fL (ref 78.0–100.0)
PLATELETS: 280 10*3/uL (ref 150–400)
RBC: 4.39 MIL/uL (ref 3.87–5.11)
RDW: 12.9 % (ref 11.5–15.5)
WBC: 5.3 10*3/uL (ref 4.0–10.5)

## 2016-08-28 LAB — BRAIN NATRIURETIC PEPTIDE: B Natriuretic Peptide: 46.7 pg/mL (ref 0.0–100.0)

## 2016-08-28 MED ORDER — AZITHROMYCIN 250 MG PO TABS: ORAL_TABLET | ORAL | 0 refills | Status: DC

## 2016-08-28 MED ORDER — DEXTROSE 5 % IV SOLN
1.0000 g | Freq: Once | INTRAVENOUS | Status: AC
  Administered 2016-08-28: 1 g via INTRAVENOUS
  Filled 2016-08-28: qty 10

## 2016-08-28 MED ORDER — LEVOFLOXACIN IN D5W 500 MG/100ML IV SOLN
500.0000 mg | Freq: Once | INTRAVENOUS | Status: DC
  Filled 2016-08-28: qty 100

## 2016-08-28 MED ORDER — IOPAMIDOL (ISOVUE-300) INJECTION 61%
INTRAVENOUS | Status: AC
  Administered 2016-08-28: 75 mL
  Filled 2016-08-28: qty 75

## 2016-08-28 NOTE — ED Notes (Signed)
Patient taken to CT.

## 2016-08-28 NOTE — ED Triage Notes (Signed)
Pt. Reports feeling like she is having an irregular Heart beat.  She started feeling it this weekend along with sob and fatique.  This morningwhen she came to work after sitting at the computer ,she realized that she was not feeling right.  She denies any chest pain.   Is having sob.  She also feet shaky.  Pt. Denies any n/v/d.  Skin is pink, warm and dry.  ECG completed in Triage.

## 2016-08-28 NOTE — ED Notes (Signed)
Pt given gown to change into.

## 2016-08-28 NOTE — ED Notes (Signed)
ED Provider at bedside. 

## 2016-08-28 NOTE — ED Provider Notes (Signed)
Clinical Course as of Aug 28 1708  Mon Aug 28, 2016  1607 Awaiting CT scan results  [id number] CT scan finding reviewed.  No mass noted on CT scan  [JK]    Clinical Course User Index [JK] Dorie Rank, MD      Dorie Rank, MD 08/28/16 [id number]

## 2016-08-28 NOTE — ED Notes (Signed)
Patient Alert and oriented X4. Stable and ambulatory. Patient verbalized understanding of the discharge instructions.  Patient belongings were taken by the patient.  

## 2016-08-28 NOTE — ED Notes (Signed)
Patient states that she gets severely nauseated when she takes Levaquin.  MD made aware and is going to change the order.

## 2016-08-28 NOTE — ED Provider Notes (Signed)
[facility] DEPT Provider Note   CSN: [id number] Arrival date & time: 08/28/16  1213     History   Chief Complaint Chief Complaint  Patient presents with  . Irregular Heart Beat    HPI Amanda Lambert is a 70 y.o. female.  HPI Patient presents with irregular palpitations worsening over the weekend. She's had increased shortness of breath and fatigue. States she's had URI symptoms and has been feeling shaky. Denies any chest pain she has lower extremity swelling but states this is at her baseline. Past Medical History:  Diagnosis Date  . Arthritis    right elbow  . Biliary dyskinesia   . Constipation   . GERD (gastroesophageal reflux disease)   . Hepatitis B 1975  . Hypertension   . Nausea   . PONV (postoperative nausea and vomiting)    slow to wake up  . [place] spotted fever 04/01/2012   adm. to hospital  . Weakness     Patient Active Problem List   Diagnosis Date Noted  . Biliary dyskinesia 04/09/2012  . Fever 04/01/2012  . Transaminitis 04/01/2012  . UTI (lower urinary tract infection) 04/01/2012  . Leukopenia 04/01/2012  . Thrombocytopenia ([place]) 04/01/2012  . HTN (hypertension) 04/01/2012  . GERD (gastroesophageal reflux disease) 04/01/2012  . Oral thrush 04/01/2012    Past Surgical History:  Procedure Laterality Date  . APPENDECTOMY  1963  . CHOLECYSTECTOMY  04/26/2012   Procedure: LAPAROSCOPIC CHOLECYSTECTOMY WITH INTRAOPERATIVE CHOLANGIOGRAM;  Surgeon: Merrie Roof, MD;  Location: [facility];  Service: General;  Laterality: N/A;  . DILATION AND CURETTAGE OF UTERUS  1982   for miscarriage  . DILATION AND CURETTAGE, DIAGNOSTIC / THERAPEUTIC  1972  . TONSILLECTOMY  1964  - approximate  . WRIST FRACTURE SURGERY  2008   right    OB History    No data available       Home Medications    Prior to Admission medications   Medication Sig Start Date End Date Taking? Authorizing Provider  cholestyramine Lucrezia Starch) 4 G packet Take 4 g by mouth  daily.   Yes Historical Provider, MD  metoprolol (LOPRESSOR) 50 MG tablet Take 25 mg by mouth 2 (two) times daily.    Yes Historical Provider, MD  pantoprazole (PROTONIX) 40 MG tablet Take 40 mg by mouth daily.   Yes Historical Provider, MD  thiamine (VITAMIN B-1) 100 MG tablet Take 100 mg by mouth daily.   Yes Historical Provider, MD  triamterene-hydrochlorothiazide (MAXZIDE) 75-50 MG per tablet Take 1 tablet by mouth every morning.    Yes Historical Provider, MD  azithromycin (ZITHROMAX Z-PAK) 250 MG tablet 2 po day one, then 1 daily x 4 days 08/28/16   Julianne Rice, MD    Family History Family History  Problem Relation Age of Onset  . Cardiomyopathy Mother   . Coronary artery disease Father     Social History Social History  Substance Use Topics  . Smoking status: Never Smoker  . Smokeless tobacco: Never Used  . Alcohol use No     Allergies   Thorazine [chlorpromazine]; Levaquin [levofloxacin in d5w]; Shrimp [shellfish allergy]; Ace inhibitors; Anticoagulant compound; Compazine [prochlorperazine edisylate]; and Latex   Review of Systems Review of Systems  Constitutional: Positive for fatigue. Negative for chills and fever.  HENT: Positive for congestion. Negative for sore throat.   Respiratory: Positive for shortness of breath. Negative for cough and wheezing.   Cardiovascular: Positive for leg swelling. Negative for chest pain.  Gastrointestinal:  Negative for abdominal pain, constipation, diarrhea, nausea and vomiting.  Genitourinary: Negative for dysuria, flank pain and frequency.  Musculoskeletal: Negative for back pain, myalgias, neck pain and neck stiffness.  Skin: Negative for rash and wound.  Neurological: Positive for weakness (generalized). Negative for dizziness, light-headedness, numbness and headaches.  All other systems reviewed and are negative.    Physical Exam Updated Vital Signs BP 143/76   Pulse 73   Temp 97.5 F (36.4 C) (Oral)   Resp 21   Ht  5\' 7"  (1.702 m)   Wt 210 lb (95.3 kg)   SpO2 97%   BMI 32.89 kg/m   Physical Exam  Constitutional: She is oriented to person, place, and time. She appears well-developed and well-nourished. No distress.  HENT:  Head: Normocephalic and atraumatic.  Mouth/Throat: Oropharynx is clear and moist. No oropharyngeal exudate.  Eyes: EOM are normal. Pupils are equal, round, and reactive to light.  Neck: Normal range of motion. Neck supple. No JVD present.  Cardiovascular: Normal rate and regular rhythm.  Exam reveals no gallop and no friction rub.   No murmur heard. Pulmonary/Chest: Effort normal and breath sounds normal. No respiratory distress. She has no wheezes. She has no rales. She exhibits no tenderness.  Abdominal: Soft. Bowel sounds are normal. There is no tenderness. There is no rebound and no guarding.  Musculoskeletal: Normal range of motion. She exhibits tenderness. She exhibits no edema.  Patient with bilateral lower extremity edema right greater than left. Patient has an area will meters in diameter of erythema over the left calf that is tender to palpation. Distal pulses are equal.  Neurological: She is alert and oriented to person, place, and time.  Patient is alert and oriented x3 with clear, goal oriented speech. Patient has 5/5 motor in all extremities. Sensation is intact to light touch.   Skin: Skin is warm and dry. Capillary refill takes less than 2 seconds. No rash noted. She is not diaphoretic. No erythema.  Psychiatric: She has a normal mood and affect. Her behavior is normal.  Nursing note and vitals reviewed.    ED Treatments / Results  Labs (all labs ordered are listed, but only abnormal results are displayed) Labs Reviewed  BASIC METABOLIC PANEL - Abnormal; Notable for the following:       Result Value   GFR calc non Af Amer 58 (*)    All other components within normal limits  URINALYSIS, ROUTINE W REFLEX MICROSCOPIC (NOT AT [facility]) - Abnormal; Notable for the  following:    Leukocytes, UA TRACE (*)    All other components within normal limits  URINE MICROSCOPIC-ADD ON - Abnormal; Notable for the following:    Squamous Epithelial / LPF 0-5 (*)    Bacteria, UA RARE (*)    All other components within normal limits  CBC  BRAIN NATRIURETIC PEPTIDE  D-DIMER, QUANTITATIVE (NOT AT [facility])  I-STAT TROPOININ, ED    EKG  EKG Interpretation  Date/Time:  Monday August 28 2016 12:16:03 EST Ventricular Rate:  75 PR Interval:  194 QRS Duration: 132 QT Interval:  406 QTC Calculation: 453 R Axis:   -58 Text Interpretation:  Sinus rhythm with Premature atrial complexes Left axis deviation Left bundle branch block Abnormal ECG Confirmed by Lita Mains  MD,  ([id number]) on 08/28/2016 12:47:38 PM Also confirmed by Lita Mains  MD,  ([id number]), editor Babb, Joelene Millin [id number])  on 08/28/2016 3:29:35 PM       Radiology Dg Chest 2 View  Result Date: 08/28/2016 CLINICAL  DATA:  Irregular heartbeat. EXAM: CHEST  2 VIEW COMPARISON:  CT 04/04/2014.  Chest x-ray 03/2014. FINDINGS: Mediastinum and hilar structures are normal. Mild infiltrate right upper lobe. No pleural effusion or pneumothorax. Cardiomegaly normal pulmonary vascularity. No acute bony abnormality . IMPRESSION: Mild infiltrate right upper lobe. Close follow-up chest x-rays recommended to demonstrate clearing and to exclude developing mass lesion. These results will be called to the ordering clinician or representative by the Radiologist Assistant, and communication documented in the PACS or zVision Dashboard. Electronically Signed   By: Marcello Moores  Register   On: 08/28/2016 13:31   Ct Chest W Contrast  Result Date: 08/28/2016 CLINICAL DATA:  A arrhythmia today.  Abnormal chest radiography. EXAM: CT CHEST WITH CONTRAST TECHNIQUE: Multidetector CT imaging of the chest was performed during intravenous contrast administration. CONTRAST:  36mL ISOVUE-300 IOPAMIDOL (ISOVUE-300) INJECTION 61% COMPARISON:  Chest  radiography same day. Chest CT 04/04/2014. Abdominal ultrasound 04/01/2012. FINDINGS: Cardiovascular: Mild aortic atherosclerosis. No aneurysm or dissection. No visible coronary artery calcification. No pericardial fluid. Pulmonary arteries appear normal. Mediastinum/Nodes: No mass or lymphadenopathy. Lungs/Pleura: 3 mm subpleural nodule on the right, unchanged since last year and certainly benign. No right upper lobe mass or infiltrate. Densities at chest radiography related to snap artifacts. Benign subpleural nodule in the left lower lobe image 77 is unchanged and benign. Mild patchy density laterally in the right middle lobe ,at the right base and at the left base is most consistent with residual scarring. Minimal hazy infiltrate not excluded, but this does not seem to match the clinical presentation. No pleural fluid. Upper Abdomen: Fatty liver. More focal fat in the central right lobe as demonstrated on previous examinations as old as 2013. Musculoskeletal: No significant finding. IMPRESSION: Right upper lobe densities at radiography represents snap/monitor artifacts. Hazy density laterally in the right middle lobe and at both the right and left bases, probably residual scarring from previous acute lung disease in July of 2015. Cannot rule out minimal hazy pneumonia, but this does not seem to match the clinical presentation. Few scattered benign-appearing pulmonary micro nodules. Fatty liver with 2.8 cm region of focal fat in the central right lobe as demonstrated several years ago. Electronically Signed   By: Nelson Chimes M.D.   On: 08/28/2016 16:10    Procedures Procedures (including critical care time)  Medications Ordered in ED Medications  iopamidol (ISOVUE-300) 61 % injection (75 mLs  Contrast Given 08/28/16 1545)  cefTRIAXone (ROCEPHIN) 1 g in dextrose 5 % 50 mL IVPB (0 g Intravenous Stopped 08/28/16 1629)     Initial Impression / Assessment and Plan / ED Course  I have reviewed the triage  vital signs and the nursing notes.  Pertinent labs & imaging results that were available during my care of the patient were reviewed by me and considered in my medical decision making (see chart for details).  Clinical Course as of Aug 30 1543  Mon Aug 28, 2016  1607 Awaiting CT scan results  [id number] CT scan finding reviewed.  No mass noted on CT scan  [JK]    Clinical Course User Index [JK] Dorie Rank, MD   Givenof Rocephin in the emergency department. We'll get CT scan to rule out mass. Can follow-up with her primary physician. Signed out to oncoming emergency physician pending CT results and antibiotic infusion.   Final Clinical Impressions(s) / ED Diagnoses   Final diagnoses:  Community acquired pneumonia of right upper lobe of lung ([place])    New Prescriptions  Discharge Medication List as of 08/28/2016  5:08 PM    START taking these medications   Details  azithromycin (ZITHROMAX Z-PAK) 250 MG tablet 2 po day one, then 1 daily x 4 days, Print         Julianne Rice, MD 08/29/16 1544

## 2016-09-05 MED FILL — METOPROLOL TARTRATE 50 MG T: 50 | 90 days supply | Qty: 90 | Fill #0

## 2016-09-27 DIAGNOSIS — I447 Left bundle-branch block, unspecified: Secondary | ICD-10-CM | POA: Diagnosis not present

## 2016-09-27 DIAGNOSIS — K219 Gastro-esophageal reflux disease without esophagitis: Secondary | ICD-10-CM | POA: Diagnosis not present

## 2016-09-27 DIAGNOSIS — R197 Diarrhea, unspecified: Secondary | ICD-10-CM | POA: Diagnosis not present

## 2016-09-27 DIAGNOSIS — K589 Irritable bowel syndrome without diarrhea: Secondary | ICD-10-CM | POA: Diagnosis not present

## 2016-09-28 DIAGNOSIS — R197 Diarrhea, unspecified: Secondary | ICD-10-CM | POA: Diagnosis not present

## 2016-09-29 DIAGNOSIS — I10 Essential (primary) hypertension: Secondary | ICD-10-CM | POA: Diagnosis not present

## 2016-09-29 DIAGNOSIS — Z79899 Other long term (current) drug therapy: Secondary | ICD-10-CM | POA: Diagnosis not present

## 2016-09-29 DIAGNOSIS — R109 Unspecified abdominal pain: Secondary | ICD-10-CM | POA: Diagnosis not present

## 2016-09-29 DIAGNOSIS — K219 Gastro-esophageal reflux disease without esophagitis: Secondary | ICD-10-CM | POA: Diagnosis not present

## 2016-09-29 DIAGNOSIS — Z0000 Encounter for general adult medical examination without abnormal findings: Secondary | ICD-10-CM | POA: Diagnosis not present

## 2016-09-29 DIAGNOSIS — R002 Palpitations: Secondary | ICD-10-CM | POA: Diagnosis not present

## 2016-09-29 DIAGNOSIS — J309 Allergic rhinitis, unspecified: Secondary | ICD-10-CM | POA: Diagnosis not present

## 2016-09-29 DIAGNOSIS — I491 Atrial premature depolarization: Secondary | ICD-10-CM | POA: Diagnosis not present

## 2016-09-29 DIAGNOSIS — K5900 Constipation, unspecified: Secondary | ICD-10-CM | POA: Diagnosis not present

## 2016-09-29 DIAGNOSIS — E559 Vitamin D deficiency, unspecified: Secondary | ICD-10-CM | POA: Diagnosis not present

## 2016-09-29 DIAGNOSIS — G2581 Restless legs syndrome: Secondary | ICD-10-CM | POA: Diagnosis not present

## 2016-10-17 DIAGNOSIS — R197 Diarrhea, unspecified: Secondary | ICD-10-CM | POA: Diagnosis not present

## 2016-10-17 DIAGNOSIS — K219 Gastro-esophageal reflux disease without esophagitis: Secondary | ICD-10-CM | POA: Diagnosis not present

## 2016-10-17 DIAGNOSIS — K589 Irritable bowel syndrome without diarrhea: Secondary | ICD-10-CM | POA: Diagnosis not present

## 2016-10-17 MED FILL — GLYCOPYRROLATE 2 MG TABLET: 2 | 30 days supply | Qty: 60 | Fill #0

## 2016-10-17 MED FILL — COLESTIPOL HCL 1 GM TABLET: 1 | 30 days supply | Qty: 60 | Fill #0

## 2016-10-18 MED FILL — TRIAMTERENE-HCTZ 37.5-25 MG: 37.5-25 | 90 days supply | Qty: 90 | Fill #1

## 2016-10-19 ENCOUNTER — Encounter: Payer: Self-pay | Admitting: Podiatry

## 2016-10-19 ENCOUNTER — Ambulatory Visit (INDEPENDENT_AMBULATORY_CARE_PROVIDER_SITE_OTHER): Payer: 59 | Admitting: Podiatry

## 2016-10-19 ENCOUNTER — Ambulatory Visit (INDEPENDENT_AMBULATORY_CARE_PROVIDER_SITE_OTHER): Payer: 59

## 2016-10-19 VITALS — BP 137/70 | HR 66 | Resp 16

## 2016-10-19 DIAGNOSIS — Q828 Other specified congenital malformations of skin: Secondary | ICD-10-CM | POA: Diagnosis not present

## 2016-10-19 DIAGNOSIS — M2042 Other hammer toe(s) (acquired), left foot: Secondary | ICD-10-CM | POA: Diagnosis not present

## 2016-10-19 DIAGNOSIS — G5792 Unspecified mononeuropathy of left lower limb: Secondary | ICD-10-CM

## 2016-10-19 DIAGNOSIS — M2040 Other hammer toe(s) (acquired), unspecified foot: Secondary | ICD-10-CM

## 2016-10-19 NOTE — Progress Notes (Signed)
   Subjective:    Patient ID: Amanda Lambert, female    DOB: 14-Nov-1945, 71 y.o.   MRN: [id number]  HPI: She presents today as a new patient with a chief complaint of pain to the fourth digit lateral aspect left foot. She states that she's tried wearing cushions to know about. She keeps the toe wrap and separated but it still causes pain. She states that she feels that she gets radiating pain as well as throbbing with shoe gear.    Review of Systems  Gastrointestinal: Positive for abdominal pain.  All other systems reviewed and are negative.      Objective:   Physical Exam: Vital signs are stable she is alert and oriented 3. Pulses are palpable. Neurologic sensorium is intact. Deep tendon reflex are intact. Muscle strength is 5 over 5 dorsiflexion plantar flexors and inverters everters all intrinsic musculature is intact. Orthopedic evaluation demonstrates mild adductovarus rotation of the fifth digit in juxtaposition to the fourth digit resulting in a porokeratotic lesion and tenderness on palpation from the neuritis beneath the lesion. Cutaneous evaluation and stress porokeratotic lesion no open lesions or wounds fourth digit left foot.     Assessment & Plan:  Assessment: Neuritis and hammertoe deformity with porokeratosis.  Plan: I injected sublesionally today with dexamethasone and local anesthetic and a deeply nucleated the lesion. She will follow up with [place] on an as-needed basis and did discuss the possible need for surgical intervention.

## 2016-11-10 MED FILL — PANTOPRAZOLE SOD DR 40 MG T: 40 | 90 days supply | Qty: 90 | Fill #0

## 2016-12-07 MED FILL — METOPROLOL TARTRATE 50 MG T: 50 | 90 days supply | Qty: 90 | Fill #1

## 2016-12-07 MED FILL — COLESTIPOL HCL 1 GM TABLET: 1 | 30 days supply | Qty: 60 | Fill #1

## 2016-12-15 DIAGNOSIS — R197 Diarrhea, unspecified: Secondary | ICD-10-CM | POA: Diagnosis not present

## 2017-01-08 DIAGNOSIS — J029 Acute pharyngitis, unspecified: Secondary | ICD-10-CM | POA: Diagnosis not present

## 2017-01-22 MED FILL — GLYCOPYRROLATE 2 MG TABLET: 2 | 30 days supply | Qty: 60 | Fill #1

## 2017-01-22 MED FILL — TRIAMTERENE-HCTZ 37.5-25 MG: 37.5-25 | 90 days supply | Qty: 90 | Fill #2

## 2017-01-23 DIAGNOSIS — M7061 Trochanteric bursitis, right hip: Secondary | ICD-10-CM | POA: Diagnosis not present

## 2017-01-23 DIAGNOSIS — M545 Low back pain: Secondary | ICD-10-CM | POA: Diagnosis not present

## 2017-01-23 MED FILL — predniSONE 10 MG TABS: 10 | 12 days supply | Qty: 48 | Fill #0

## 2017-02-02 DIAGNOSIS — R300 Dysuria: Secondary | ICD-10-CM | POA: Diagnosis not present

## 2017-02-02 DIAGNOSIS — I10 Essential (primary) hypertension: Secondary | ICD-10-CM | POA: Diagnosis not present

## 2017-02-02 MED FILL — CIPROFLOXACIN HCL 500 MG TA: 500 | 5 days supply | Qty: 10 | Fill #0

## 2017-02-05 MED FILL — PANTOPRAZOLE SOD DR 40 MG T: 40 | 90 days supply | Qty: 90 | Fill #1

## 2017-02-05 MED FILL — COLESTIPOL HCL 1 GM TABLET: 1 | 30 days supply | Qty: 60 | Fill #2

## 2017-03-07 MED FILL — METOPROLOL TARTRATE 50 MG T: 50 | 90 days supply | Qty: 90 | Fill #2

## 2017-03-26 MED FILL — GLYCOPYRROLATE 2 MG TABLET: 2 | 30 days supply | Qty: 60 | Fill #2

## 2017-03-30 MED FILL — COLESTIPOL HCL 1 GM TABLET: 1 | 30 days supply | Qty: 60 | Fill #3

## 2017-04-11 ENCOUNTER — Ambulatory Visit (INDEPENDENT_AMBULATORY_CARE_PROVIDER_SITE_OTHER): Payer: 59 | Admitting: Podiatry

## 2017-04-11 DIAGNOSIS — M722 Plantar fascial fibromatosis: Secondary | ICD-10-CM

## 2017-04-11 MED ORDER — MELOXICAM 15 MG PO TABS: 15.0000 mg | ORAL_TABLET | Freq: Every day | ORAL | 3 refills | Status: DC

## 2017-04-11 MED ORDER — METHYLPREDNISOLONE 4 MG PO TBPK: ORAL_TABLET | ORAL | 0 refills | Status: DC

## 2017-04-11 MED FILL — MELOXICAM 15 MG TABLET: 15 | 30 days supply | Qty: 30 | Fill #0

## 2017-04-11 MED FILL — METHYLPREDNISOLONE 4 MG TAB: 4 | 6 days supply | Qty: 21 | Fill #0

## 2017-04-11 NOTE — Progress Notes (Signed)
She presents today with a chief complaint of pain to her left heel for the past 3-4 months. She reports having plantar fasciitis flareup and has had in the past. She states that is constant stabbing pain in her heel and she's try stretches changing her shoes and taking Aleve.  Objective: Vital signs are stable she is alert and oriented 3. Pulses are palpable. Neurologic sensorium is intact. Deep tendon reflexes are intact. Muscle strength is normal bilateral. She has pain on palpation medial calcaneal tubercle of the left heel.  Assessment: Plantar fasciitis left.  Plan: Injected her left heel with Kenalog and local anesthetic. Placed her plantar fascial brace and a night splint. Discussed appropriate shoe gear stretching exercises ice if she modifications started her on a Medrol Dosepak to be followed by meloxicam. Follow up with her in 1 month's

## 2017-04-11 NOTE — Patient Instructions (Signed)

## 2017-05-03 MED FILL — PANTOPRAZOLE SOD DR 40 MG T: 40 | 90 days supply | Qty: 90 | Fill #2

## 2017-05-04 MED FILL — TRIAMTERENE-HCTZ 37.5-25 MG: 37.5-25 | 90 days supply | Qty: 90 | Fill #0 | Status: TO

## 2017-05-16 ENCOUNTER — Encounter: Payer: Self-pay | Admitting: Podiatry

## 2017-05-16 ENCOUNTER — Ambulatory Visit (INDEPENDENT_AMBULATORY_CARE_PROVIDER_SITE_OTHER): Payer: 59 | Admitting: Podiatry

## 2017-05-16 DIAGNOSIS — M722 Plantar fascial fibromatosis: Secondary | ICD-10-CM

## 2017-05-16 NOTE — Progress Notes (Signed)
She presents today for follow-up of her plantar fasciitis of her left foot. She states that doing very good for the first couple of weeks but I saw taking the medication and stopped using my splint. She also states that she purchased new pair of shoes and they bother her feet so she is back walking in her open heeled crocs.  Objective: Vital signs are stable alert and oriented 3. Pulses are palpable. Neurologic sensorium is intact. Deep tendon reflexes are intact. No calf pain. She has pain on palpation medial continue tubercle for left heel.  Assessment: Plantar fasciitis left.  Plan: I encouraged her to be compliant take her medication regularly use her plantar fascial braces at night splints and continue the use of her tennis shoes even if they are sore initially. Follow up with her in 1 month. I reinjected her left heel today.

## 2017-05-25 MED FILL — MELOXICAM 15 MG TABLET: 15 | 30 days supply | Qty: 30 | Fill #1

## 2017-06-04 MED FILL — GLYCOPYRROLATE 2 MG TABLET: 2 | 30 days supply | Qty: 60 | Fill #3

## 2017-06-04 MED FILL — COLESTIPOL HCL 1 GM TABLET: 1 | 30 days supply | Qty: 60 | Fill #4

## 2017-06-05 MED FILL — METOPROLOL TARTRATE 50 MG T: 50 | 90 days supply | Qty: 90 | Fill #0 | Status: TO

## 2017-06-13 ENCOUNTER — Ambulatory Visit: Payer: 59 | Admitting: Podiatry

## 2017-06-27 DIAGNOSIS — Z0100 Encounter for examination of eyes and vision without abnormal findings: Secondary | ICD-10-CM | POA: Diagnosis not present

## 2017-06-29 MED FILL — MELOXICAM 15 MG TABLET: 15 | 30 days supply | Qty: 30 | Fill #2

## 2017-07-04 ENCOUNTER — Ambulatory Visit (INDEPENDENT_AMBULATORY_CARE_PROVIDER_SITE_OTHER): Payer: 59 | Admitting: Podiatry

## 2017-07-04 ENCOUNTER — Encounter: Payer: Self-pay | Admitting: Podiatry

## 2017-07-04 DIAGNOSIS — M722 Plantar fascial fibromatosis: Secondary | ICD-10-CM

## 2017-07-04 NOTE — Progress Notes (Signed)
She presents today for follow-up of her plantar fasciitis of her left heel. She states that he was doing great until I stood at church doing stuff now is killing me again. She states that he was hurting of the back my leg and calf now that has subsided.  Objective: Vital signs are stable alert and oriented 3 pulses are palpable no calf pain. No pain on palpation of the Achilles tendon. She only has pain on palpation of the medial calcaneal tubercle of her left heel.  Assessment: Plantar fasciitis left foot.  Plan: Reinjected her left foot today she will continue with all other conservative therapies.  I want her to follow up with [facility] orthotics.Marland Kitchen

## 2017-08-02 MED FILL — PANTOPRAZOLE SOD DR 40 MG T: 40 | 90 days supply | Qty: 90 | Fill #3

## 2017-08-02 MED FILL — TRIAMTERENE/HCTZ 37.5/25 TB: 37.5-25 | 90 days supply | Qty: 90 | Fill #1 | Status: TO

## 2017-08-02 MED FILL — MELOXICAM 15 MG TABLET: 15 | 30 days supply | Qty: 30 | Fill #3

## 2017-08-02 MED FILL — GLYCOPYRROLATE 2 MG TABLET: 2 | 30 days supply | Qty: 60 | Fill #4

## 2017-08-06 MED FILL — COLESTIPOL HCL 1 GM TABLET: 1 | 30 days supply | Qty: 60 | Fill #5

## 2017-08-08 ENCOUNTER — Ambulatory Visit (INDEPENDENT_AMBULATORY_CARE_PROVIDER_SITE_OTHER): Payer: 59 | Admitting: Orthotics

## 2017-08-08 DIAGNOSIS — M722 Plantar fascial fibromatosis: Secondary | ICD-10-CM

## 2017-08-08 DIAGNOSIS — Q828 Other specified congenital malformations of skin: Secondary | ICD-10-CM

## 2017-08-08 DIAGNOSIS — G5792 Unspecified mononeuropathy of left lower limb: Secondary | ICD-10-CM

## 2017-08-08 NOTE — Addendum Note (Signed)
Addended by: Velora Heckler on: 08/08/2017 09:48 AM   Modules accepted: Level of Service

## 2017-08-08 NOTE — Progress Notes (Signed)

## 2017-09-06 ENCOUNTER — Ambulatory Visit (INDEPENDENT_AMBULATORY_CARE_PROVIDER_SITE_OTHER): Payer: 59 | Admitting: Orthotics

## 2017-09-06 DIAGNOSIS — M722 Plantar fascial fibromatosis: Secondary | ICD-10-CM

## 2017-09-06 DIAGNOSIS — Q828 Other specified congenital malformations of skin: Secondary | ICD-10-CM

## 2017-09-06 NOTE — Progress Notes (Signed)
Patient came in today to pick up custom made foot orthotics.  The goals were accomplished and the patient reported no dissatisfaction with said orthotics.  Patient was advised of breakin period and how to report any issues. 

## 2017-09-10 MED FILL — METOPROLOL TARTRATE 50 MG T: 50 | 90 days supply | Qty: 90 | Fill #1 | Status: TO

## 2017-09-12 DIAGNOSIS — Z1231 Encounter for screening mammogram for malignant neoplasm of breast: Secondary | ICD-10-CM | POA: Diagnosis not present

## 2017-09-12 DIAGNOSIS — Z01419 Encounter for gynecological examination (general) (routine) without abnormal findings: Secondary | ICD-10-CM | POA: Diagnosis not present

## 2017-09-27 DIAGNOSIS — M7061 Trochanteric bursitis, right hip: Secondary | ICD-10-CM | POA: Diagnosis not present

## 2017-10-01 ENCOUNTER — Ambulatory Visit: Payer: 59 | Admitting: Podiatry

## 2017-10-01 ENCOUNTER — Encounter: Payer: Self-pay | Admitting: Podiatry

## 2017-10-01 DIAGNOSIS — M722 Plantar fascial fibromatosis: Secondary | ICD-10-CM

## 2017-10-01 NOTE — Progress Notes (Signed)
She presents today for follow-up of her left heel.  States it is been hurting but got a steroid pack for my hip and that has helped.  Objective: Vital signs are stable she is alert and oriented x3 no calf pain pulses remain palpable.  She has pain on palpation medial calcaneal tubercle of the left heel at the plantar fascial calcaneal insertion site.  Assessment: In limb secondary to plantar fasciitis.  Plan: Discussed etiology pathology conservative versus surgical therapies.  At this point I injected dexamethasone Kenalog and local anesthetic to the point of maximal tenderness after verbal consent and Betadine skin prep.  Tolerated procedure well without complications.

## 2017-10-08 MED FILL — COLESTIPOL HCL 1 GM TABLET: 1 | 30 days supply | Qty: 60 | Fill #6

## 2017-10-10 DIAGNOSIS — K5900 Constipation, unspecified: Secondary | ICD-10-CM | POA: Diagnosis not present

## 2017-10-10 DIAGNOSIS — Z0000 Encounter for general adult medical examination without abnormal findings: Secondary | ICD-10-CM | POA: Diagnosis not present

## 2017-10-10 DIAGNOSIS — J309 Allergic rhinitis, unspecified: Secondary | ICD-10-CM | POA: Diagnosis not present

## 2017-10-10 DIAGNOSIS — G2581 Restless legs syndrome: Secondary | ICD-10-CM | POA: Diagnosis not present

## 2017-10-10 DIAGNOSIS — I10 Essential (primary) hypertension: Secondary | ICD-10-CM | POA: Diagnosis not present

## 2017-10-10 DIAGNOSIS — K219 Gastro-esophageal reflux disease without esophagitis: Secondary | ICD-10-CM | POA: Diagnosis not present

## 2017-10-10 DIAGNOSIS — E559 Vitamin D deficiency, unspecified: Secondary | ICD-10-CM | POA: Diagnosis not present

## 2017-10-10 DIAGNOSIS — R002 Palpitations: Secondary | ICD-10-CM | POA: Diagnosis not present

## 2017-10-10 DIAGNOSIS — Z1389 Encounter for screening for other disorder: Secondary | ICD-10-CM | POA: Diagnosis not present

## 2017-10-10 DIAGNOSIS — Z23 Encounter for immunization: Secondary | ICD-10-CM | POA: Diagnosis not present

## 2017-10-10 MED FILL — GLYCOPYRROLATE 2 MG TABLET: 2 | 30 days supply | Qty: 60 | Fill #5

## 2017-10-29 MED FILL — PANTOPRAZOLE SOD DR 40 MG T: 40 | 90 days supply | Qty: 90 | Fill #0 | Status: TO

## 2017-10-29 MED FILL — TRIAMTERENE/HCTZ 37.5/25 TB: 37.5-25 | 30 days supply | Qty: 30 | Fill #2 | Status: TO

## 2017-10-31 ENCOUNTER — Ambulatory Visit (INDEPENDENT_AMBULATORY_CARE_PROVIDER_SITE_OTHER): Payer: 59 | Admitting: Podiatry

## 2017-10-31 ENCOUNTER — Encounter: Payer: Self-pay | Admitting: Podiatry

## 2017-10-31 ENCOUNTER — Ambulatory Visit: Payer: 59

## 2017-10-31 DIAGNOSIS — B070 Plantar wart: Secondary | ICD-10-CM | POA: Diagnosis not present

## 2017-10-31 DIAGNOSIS — Q828 Other specified congenital malformations of skin: Secondary | ICD-10-CM

## 2017-10-31 DIAGNOSIS — S90851A Superficial foreign body, right foot, initial encounter: Secondary | ICD-10-CM

## 2017-10-31 NOTE — Progress Notes (Signed)
She presents today states that she thinks she may she may have something in the bottom of her right foot.  Thinks it is possibly a wart but is been there for the past 2-3 weeks.  She states that is starting to get sore when she is walking on it after something stuck in her foot.  She has been taking it out and using peroxide.  Objective: Vital signs are stable alert and oriented x3.  Pulses are palpable.  Neurologic sensorium is intact.  Deep tendon reflexes are intact.  Muscle strength was 5/5 dorsiflexors plantar flexors inverters everters all intrinsic musculature is intact.  Orthopedic evaluation demonstrates all joints distal to the ankle full range of motion without crepitation.  Cutaneous evaluation demonstrates a solitary poor keratoma to the plantar aspect of the fourth metatarsal head of the right foot.  This appears to have smaller areas of similar problem around the fifth metatarsal head.  This does not demonstrate any type of wart no thrombosed capillaries skin lines do not circumvent the lesion.  Assessment: Porokeratosis right foot.  Plan: Mechanical and chemical debridement today placed salicylic acid under occlusion to be left on until tomorrow and then thoroughly washed off.  Follow-up with her on an as-needed basis.

## 2017-11-29 MED FILL — TRIAMTERENE-HCTZ 37.5-25 MG: 37.5-25 | 30 days supply | Qty: 30 | Fill #3 | Status: TO

## 2017-12-18 DIAGNOSIS — R300 Dysuria: Secondary | ICD-10-CM | POA: Diagnosis not present

## 2017-12-25 DIAGNOSIS — M7061 Trochanteric bursitis, right hip: Secondary | ICD-10-CM | POA: Diagnosis not present

## 2017-12-25 MED FILL — GABAPENTIN 300 MG CAPSULE: 300 | 30 days supply | Qty: 30 | Fill #0

## 2017-12-31 DIAGNOSIS — R197 Diarrhea, unspecified: Secondary | ICD-10-CM | POA: Diagnosis not present

## 2017-12-31 DIAGNOSIS — Z9049 Acquired absence of other specified parts of digestive tract: Secondary | ICD-10-CM | POA: Diagnosis not present

## 2017-12-31 DIAGNOSIS — K589 Irritable bowel syndrome without diarrhea: Secondary | ICD-10-CM | POA: Diagnosis not present

## 2018-01-23 DIAGNOSIS — G500 Trigeminal neuralgia: Secondary | ICD-10-CM | POA: Diagnosis not present

## 2018-01-23 DIAGNOSIS — B349 Viral infection, unspecified: Secondary | ICD-10-CM | POA: Diagnosis not present

## 2018-03-04 ENCOUNTER — Other Ambulatory Visit: Payer: Self-pay | Admitting: Physician Assistant

## 2018-03-04 DIAGNOSIS — R1031 Right lower quadrant pain: Secondary | ICD-10-CM

## 2018-03-04 DIAGNOSIS — Z8719 Personal history of other diseases of the digestive system: Secondary | ICD-10-CM

## 2018-03-04 DIAGNOSIS — R197 Diarrhea, unspecified: Secondary | ICD-10-CM | POA: Diagnosis not present

## 2018-03-04 DIAGNOSIS — R1084 Generalized abdominal pain: Secondary | ICD-10-CM

## 2018-03-04 DIAGNOSIS — R1032 Left lower quadrant pain: Secondary | ICD-10-CM | POA: Diagnosis not present

## 2018-03-07 ENCOUNTER — Ambulatory Visit
Admission: RE | Admit: 2018-03-07 | Discharge: 2018-03-07 | Disposition: A | Discharge status: Home / Self Care | Payer: Medicare Other | Source: Ambulatory Visit | Attending: Physician Assistant | Admitting: Physician Assistant

## 2018-03-07 DIAGNOSIS — R1031 Right lower quadrant pain: Secondary | ICD-10-CM

## 2018-03-07 DIAGNOSIS — Z8719 Personal history of other diseases of the digestive system: Secondary | ICD-10-CM

## 2018-03-07 DIAGNOSIS — R1084 Generalized abdominal pain: Secondary | ICD-10-CM

## 2018-03-07 DIAGNOSIS — R1032 Left lower quadrant pain: Secondary | ICD-10-CM

## 2018-03-07 DIAGNOSIS — K760 Fatty (change of) liver, not elsewhere classified: Secondary | ICD-10-CM | POA: Diagnosis not present

## 2018-03-07 MED ORDER — IOHEXOL 300 MG/ML  SOLN
100.0000 mL | Freq: Once | INTRAMUSCULAR | Status: AC | PRN
  Administered 2018-03-07: 100 mL via INTRAVENOUS

## 2018-03-22 DIAGNOSIS — K580 Irritable bowel syndrome with diarrhea: Secondary | ICD-10-CM | POA: Diagnosis not present

## 2018-03-22 DIAGNOSIS — K589 Irritable bowel syndrome without diarrhea: Secondary | ICD-10-CM | POA: Diagnosis not present

## 2018-05-09 DIAGNOSIS — M7061 Trochanteric bursitis, right hip: Secondary | ICD-10-CM | POA: Diagnosis not present

## 2018-06-03 DIAGNOSIS — K219 Gastro-esophageal reflux disease without esophagitis: Secondary | ICD-10-CM | POA: Diagnosis not present

## 2018-06-03 DIAGNOSIS — R197 Diarrhea, unspecified: Secondary | ICD-10-CM | POA: Diagnosis not present

## 2018-06-03 DIAGNOSIS — Z9049 Acquired absence of other specified parts of digestive tract: Secondary | ICD-10-CM | POA: Diagnosis not present

## 2018-06-03 DIAGNOSIS — K580 Irritable bowel syndrome with diarrhea: Secondary | ICD-10-CM | POA: Diagnosis not present

## 2018-06-10 DIAGNOSIS — Z23 Encounter for immunization: Secondary | ICD-10-CM | POA: Diagnosis not present

## 2018-10-21 ENCOUNTER — Other Ambulatory Visit: Payer: Self-pay | Admitting: Internal Medicine

## 2018-10-21 DIAGNOSIS — Z1389 Encounter for screening for other disorder: Secondary | ICD-10-CM | POA: Diagnosis not present

## 2018-10-21 DIAGNOSIS — G2581 Restless legs syndrome: Secondary | ICD-10-CM | POA: Diagnosis not present

## 2018-10-21 DIAGNOSIS — Z79899 Other long term (current) drug therapy: Secondary | ICD-10-CM | POA: Diagnosis not present

## 2018-10-21 DIAGNOSIS — E559 Vitamin D deficiency, unspecified: Secondary | ICD-10-CM | POA: Diagnosis not present

## 2018-10-21 DIAGNOSIS — Z0001 Encounter for general adult medical examination with abnormal findings: Secondary | ICD-10-CM | POA: Diagnosis not present

## 2018-10-21 DIAGNOSIS — R1032 Left lower quadrant pain: Secondary | ICD-10-CM | POA: Diagnosis not present

## 2018-10-21 DIAGNOSIS — K5900 Constipation, unspecified: Secondary | ICD-10-CM | POA: Diagnosis not present

## 2018-10-21 DIAGNOSIS — I10 Essential (primary) hypertension: Secondary | ICD-10-CM | POA: Diagnosis not present

## 2018-10-21 DIAGNOSIS — K219 Gastro-esophageal reflux disease without esophagitis: Secondary | ICD-10-CM | POA: Diagnosis not present

## 2018-10-21 DIAGNOSIS — I491 Atrial premature depolarization: Secondary | ICD-10-CM | POA: Diagnosis not present

## 2018-10-21 DIAGNOSIS — K580 Irritable bowel syndrome with diarrhea: Secondary | ICD-10-CM | POA: Diagnosis not present

## 2018-10-21 DIAGNOSIS — R079 Chest pain, unspecified: Secondary | ICD-10-CM | POA: Diagnosis not present

## 2018-10-24 ENCOUNTER — Ambulatory Visit
Admission: RE | Admit: 2018-10-24 | Discharge: 2018-10-24 | Disposition: A | Discharge status: Home / Self Care | Payer: Medicare Other | Source: Ambulatory Visit | Attending: Internal Medicine | Admitting: Internal Medicine

## 2018-10-24 DIAGNOSIS — D250 Submucous leiomyoma of uterus: Secondary | ICD-10-CM | POA: Diagnosis not present

## 2018-10-24 DIAGNOSIS — R1032 Left lower quadrant pain: Secondary | ICD-10-CM

## 2018-11-04 DIAGNOSIS — Z8719 Personal history of other diseases of the digestive system: Secondary | ICD-10-CM | POA: Diagnosis not present

## 2018-11-04 DIAGNOSIS — R1032 Left lower quadrant pain: Secondary | ICD-10-CM | POA: Diagnosis not present

## 2018-12-10 ENCOUNTER — Other Ambulatory Visit: Payer: Self-pay

## 2018-12-10 ENCOUNTER — Emergency Department (HOSPITAL_BASED_OUTPATIENT_CLINIC_OR_DEPARTMENT_OTHER)
Admission: EM | Admit: 2018-12-10 | Discharge: 2018-12-10 | Disposition: A | Discharge status: Home / Self Care | Payer: Medicare Other | Attending: Emergency Medicine | Admitting: Emergency Medicine

## 2018-12-10 ENCOUNTER — Encounter (HOSPITAL_BASED_OUTPATIENT_CLINIC_OR_DEPARTMENT_OTHER): Payer: Self-pay | Admitting: Emergency Medicine

## 2018-12-10 ENCOUNTER — Emergency Department (HOSPITAL_BASED_OUTPATIENT_CLINIC_OR_DEPARTMENT_OTHER): Payer: Medicare Other

## 2018-12-10 DIAGNOSIS — Z79899 Other long term (current) drug therapy: Secondary | ICD-10-CM | POA: Diagnosis not present

## 2018-12-10 DIAGNOSIS — Z91040 Latex allergy status: Secondary | ICD-10-CM | POA: Diagnosis not present

## 2018-12-10 DIAGNOSIS — I10 Essential (primary) hypertension: Secondary | ICD-10-CM | POA: Diagnosis not present

## 2018-12-10 DIAGNOSIS — R1030 Lower abdominal pain, unspecified: Secondary | ICD-10-CM | POA: Diagnosis not present

## 2018-12-10 DIAGNOSIS — K5790 Diverticulosis of intestine, part unspecified, without perforation or abscess without bleeding: Secondary | ICD-10-CM | POA: Diagnosis not present

## 2018-12-10 LAB — CBC
HCT: 39.4 % (ref 36.0–46.0)
Hemoglobin: 12.6 g/dL (ref 12.0–15.0)
MCH: 28.1 pg (ref 26.0–34.0)
MCHC: 32 g/dL (ref 30.0–36.0)
MCV: 87.9 fL (ref 80.0–100.0)
PLATELETS: 303 10*3/uL (ref 150–400)
RBC: 4.48 MIL/uL (ref 3.87–5.11)
RDW: 12.3 % (ref 11.5–15.5)
WBC: 6 10*3/uL (ref 4.0–10.5)
nRBC: 0 % (ref 0.0–0.2)

## 2018-12-10 LAB — URINALYSIS, ROUTINE W REFLEX MICROSCOPIC
BILIRUBIN URINE: NEGATIVE
Glucose, UA: NEGATIVE mg/dL
Ketones, ur: NEGATIVE mg/dL
Nitrite: NEGATIVE
PROTEIN: NEGATIVE mg/dL
Specific Gravity, Urine: 1.015 (ref 1.005–1.030)
pH: 6.5 (ref 5.0–8.0)

## 2018-12-10 LAB — COMPREHENSIVE METABOLIC PANEL
ALBUMIN: 4 g/dL (ref 3.5–5.0)
ALK PHOS: 88 U/L (ref 38–126)
ALT: 12 U/L (ref 0–44)
AST: 19 U/L (ref 15–41)
Anion gap: 9 (ref 5–15)
BILIRUBIN TOTAL: 0.5 mg/dL (ref 0.3–1.2)
BUN: 13 mg/dL (ref 8–23)
CALCIUM: 8.8 mg/dL — AB (ref 8.9–10.3)
CO2: 26 mmol/L (ref 22–32)
CREATININE: 0.89 mg/dL (ref 0.44–1.00)
Chloride: 100 mmol/L (ref 98–111)
GFR calc Af Amer: >60 mL/min (ref >60)
GFR calc non Af Amer: >60 mL/min (ref >60)
GLUCOSE: 102 mg/dL — AB (ref 70–99)
Potassium: 3.7 mmol/L (ref 3.5–5.1)
SODIUM: 135 mmol/L (ref 135–145)
TOTAL PROTEIN: 7 g/dL (ref 6.5–8.1)

## 2018-12-10 LAB — URINALYSIS, MICROSCOPIC (REFLEX)

## 2018-12-10 LAB — LIPASE, BLOOD: Lipase: 26 U/L (ref 11–51)

## 2018-12-10 MED ORDER — KETOROLAC TROMETHAMINE 30 MG/ML IJ SOLN
15.0000 mg | Freq: Once | INTRAMUSCULAR | Status: AC
  Administered 2018-12-10: 15 mg via INTRAVENOUS
  Filled 2018-12-10: qty 1

## 2018-12-10 NOTE — ED Provider Notes (Signed)
[place] EMERGENCY DEPARTMENT Provider Note   CSN: [id number] Arrival date & time: 12/10/18  1545    History   Chief Complaint Chief Complaint  Patient presents with   Abdominal Pain    HPI Amanda Lambert is a 73 y.o. female.     HPI Patient presents to the emergency department with lower abdominal discomfort which she states is been ongoing over the last year.  She states today the pain seemed more intense than it had previously.  Patient states she can normally take Aleve or Tylenol and relieve her symptoms.  She states she did take all of this plus some gabapentin and it is now finally starting to work.  The patient states that palpation makes the pain worse.  She states that you have any other symptoms.  The patient denies chest pain, shortness of breath, headache,blurred vision, neck pain, fever, cough, weakness, numbness, dizziness, anorexia, edema, nausea, vomiting, diarrhea, rash, back pain, dysuria, hematemesis, bloody stool, near syncope, or syncope. Past Medical History:  Diagnosis Date   Arthritis    right elbow   Biliary dyskinesia    Constipation    GERD (gastroesophageal reflux disease)    Hepatitis B 1975   Hypertension    Nausea    PONV (postoperative nausea and vomiting)    slow to wake up   [facility] spotted fever 04/01/2012   adm. to hospital   Weakness     Patient Active Problem List   Diagnosis Date Noted   Biliary dyskinesia 04/09/2012   Fever 04/01/2012   Transaminitis 04/01/2012   UTI (lower urinary tract infection) 04/01/2012   Leukopenia 04/01/2012   Thrombocytopenia ([place]) 04/01/2012   HTN (hypertension) 04/01/2012   GERD (gastroesophageal reflux disease) 04/01/2012   Oral thrush 04/01/2012    Past Surgical History:  Procedure Laterality Date   APPENDECTOMY  1963   CHOLECYSTECTOMY  04/26/2012   Procedure: LAPAROSCOPIC CHOLECYSTECTOMY WITH INTRAOPERATIVE CHOLANGIOGRAM;  Surgeon: Merrie Roof,  MD;  Location: [facility] ORS;  Service: General;  Laterality: N/A;   [place] OF UTERUS  1982   for miscarriage   DILATION AND CURETTAGE, DIAGNOSTIC / [place]  - approximate   WRIST FRACTURE SURGERY  2008   right     OB History   No obstetric history on file.      Home Medications    Prior to Admission medications   Medication Sig Start Date End Date Taking? Authorizing Provider  cholestyramine Lucrezia Starch) 4 G packet Take 4 g by mouth daily.    [provider]  colestipol (COLESTID) 1 g tablet  10/17/16   [provider]  glycopyrrolate (ROBINUL) 2 MG tablet  10/17/16   [provider]  HYDROcodone-acetaminophen (NORCO/VICODIN) 5-325 MG tablet hydrocodone 5 mg-acetaminophen 325 mg tablet  TAKE 1 TABLET BY MOUTH EVERY 6-8 HOURS AS NEEDED FOR PAIN    [provider]  meloxicam (MOBIC) 15 MG tablet Take 1 tablet (15 mg total) by mouth daily. 04/11/17   Hyatt, Max T, DPM  metoprolol (LOPRESSOR) 50 MG tablet Take 25 mg by mouth 2 (two) times daily.     [provider]  pantoprazole (PROTONIX) 40 MG tablet Take 40 mg by mouth daily.    [provider]  predniSONE (DELTASONE) 10 MG tablet  01/23/17   [provider]  thiamine (VITAMIN B-1) 100 MG tablet Take 100 mg by mouth daily.    [provider]  triamterene-hydrochlorothiazide (MAXZIDE) 75-50 MG per tablet Take 1 tablet by mouth every morning.     [provider]  triamterene-hydrochlorothiazide Bradd Burner) 37.5-25 MG tablet  01/22/17   [provider]  vitamin B-12 (CYANOCOBALAMIN) 100 MCG tablet Vitamin B12    [provider]    Family History Family History  Problem Relation Age of Onset   Cardiomyopathy Mother    Coronary artery disease Father     Social History Social History   Tobacco Use   Smoking status: Never Smoker   Smokeless tobacco: Never Used  Substance Use Topics    Alcohol use: No   Drug use: No     Allergies   Thorazine [chlorpromazine]; Levaquin [levofloxacin in d5w]; Shrimp [shellfish allergy]; Ace inhibitors; Anticoagulant compound; Bee venom; Compazine [prochlorperazine edisylate]; and Latex   Review of Systems Review of Systems  All other systems negative except as documented in the HPI. All pertinent positives and negatives as reviewed in the HPI. Physical Exam Updated Vital Signs BP (!) 156/94 (BP Location: Left Arm)    Pulse 60    Temp 97.8 F (36.6 C) (Oral)    Resp 16    Ht 5\' 7"  (1.702 m)    Wt 86.2 kg    SpO2 99%    BMI 29.76 kg/m   Physical Exam Vitals signs and nursing note reviewed.  Constitutional:      General: She is not in acute distress.    Appearance: She is well-developed.  HENT:     Head: Normocephalic and atraumatic.  Eyes:     Pupils: Pupils are equal, round, and reactive to light.  Neck:     Musculoskeletal: Normal range of motion and neck supple.  Cardiovascular:     Rate and Rhythm: Normal rate and regular rhythm.     Heart sounds: Normal heart sounds. No murmur. No friction rub. No gallop.   Pulmonary:     Effort: Pulmonary effort is normal. No respiratory distress.     Breath sounds: Normal breath sounds. No wheezing.  Abdominal:     General: Bowel sounds are normal. There is no distension.     Palpations: Abdomen is soft.     Tenderness: There is abdominal tenderness in the suprapubic area.  Skin:    General: Skin is warm and dry.     Capillary Refill: Capillary refill takes less than 2 seconds.     Findings: No erythema or rash.  Neurological:     Mental Status: She is alert and oriented to person, place, and time.     Motor: No abnormal muscle tone.     Coordination: Coordination normal.  Psychiatric:        Behavior: Behavior normal.      ED Treatments / Results  Labs (all labs ordered are listed, but only abnormal results are displayed) Labs Reviewed  COMPREHENSIVE METABOLIC PANEL -  Abnormal; Notable for the following components:      Result Value   Glucose, Bld 102 (*)    Calcium 8.8 (*)    All other components within normal limits  URINALYSIS, ROUTINE W REFLEX MICROSCOPIC - Abnormal; Notable for the following components:   APPearance HAZY (*)    Hgb urine dipstick TRACE (*)    Leukocytes,Ua MODERATE (*)    All other components within normal limits  URINALYSIS, MICROSCOPIC (REFLEX) - Abnormal; Notable for the following components:   Bacteria, UA FEW (*)    All other components within normal limits  LIPASE, BLOOD  CBC  EKG None  Radiology Ct Renal Stone Study  Result Date: 12/10/2018 CLINICAL DATA:  Suprapubic abdominal pain for several hours EXAM: CT ABDOMEN AND PELVIS WITHOUT CONTRAST TECHNIQUE: Multidetector CT imaging of the abdomen and pelvis was performed following the standard protocol without IV contrast. COMPARISON:  03/07/2018 FINDINGS: Lower chest: No acute abnormality. Hepatobiliary: No focal liver abnormality is seen. Status post cholecystectomy. No biliary dilatation. Pancreas: Unremarkable. No pancreatic ductal dilatation or surrounding inflammatory changes. Spleen: Within normal limits. Adrenals/Urinary Tract: Adrenal glands are within normal limits. Some scarring is noted in the upper pole of the left kidney stable from the previous exam. No renal calculi or obstructive changes are seen. The bladder is partially distended. Stomach/Bowel: Scattered diverticular change of the colon is noted without evidence of diverticulitis. No obstructive or inflammatory changes of the colon are seen. The appendix has been surgically removed. Small bowel and stomach are within normal limits. Vascular/Lymphatic: Aortic atherosclerosis. No enlarged abdominal or pelvic lymph nodes. Reproductive: Uterus and bilateral adnexa are unremarkable. Other: No abdominal wall hernia or abnormality. No abdominopelvic ascites. Musculoskeletal: Degenerative changes of lumbar spine are  seen. IMPRESSION: Diverticulosis without evidence of diverticulitis. No acute abnormality noted. Electronically Signed   By: Inez Catalina M.D.   On: 12/10/2018 17:07    Procedures Procedures (including critical care time)  Medications Ordered in ED Medications - No data to display   Initial Impression / Assessment and Plan / ED Course  I have reviewed the triage vital signs and the nursing notes.  Pertinent labs & imaging results that were available during my care of the patient were reviewed by me and considered in my medical decision making (see chart for details).        Concern was patient may have a ureteral stones causing her pain.  At this time we do not see any abnormalities on the CT renal stone study.  The patient is feeling better at this time.  She was given a small dose of Toradol.  I advised her to return here as needed.  Patient agrees to this plan and all questions were answered.  I did advise her to follow-up with her primary doctor as well.  Final Clinical Impressions(s) / ED Diagnoses   Final diagnoses:  None    ED Discharge Orders    None       Rebeca Allegra 12/10/18 2043    Margette Fast, MD 12/10/18 2101

## 2018-12-10 NOTE — ED Triage Notes (Signed)
Reports suprapubic lower abdominal pain which began 3 hours PTA.  States she has had this issue over the past year.  States tried tylenol and aleve at home without relief.  Reports pressure with urination.  Denies N/V/D.  Ambulatory to triage in NAD.

## 2018-12-10 NOTE — ED Notes (Signed)
Pt ambulatory to room, no assistive devices

## 2018-12-10 NOTE — Discharge Instructions (Signed)
Return here as needed.  Follow-up with your doctor for recheck °

## 2018-12-11 DIAGNOSIS — R300 Dysuria: Secondary | ICD-10-CM | POA: Diagnosis not present

## 2018-12-11 DIAGNOSIS — R102 Pelvic and perineal pain: Secondary | ICD-10-CM | POA: Diagnosis not present

## 2019-05-26 DIAGNOSIS — M5432 Sciatica, left side: Secondary | ICD-10-CM | POA: Diagnosis not present

## 2019-05-26 DIAGNOSIS — Z23 Encounter for immunization: Secondary | ICD-10-CM | POA: Diagnosis not present

## 2019-06-12 IMAGING — CT CT RENAL STONE PROTOCOL
2 of 4 series · 17 of 46 positions shown, 19 images · non-contrast
Comparison: 03/07/2018

CLINICAL DATA: Suprapubic abdominal pain for several hours

EXAM:
CT ABDOMEN AND PELVIS WITHOUT CONTRAST
TECHNIQUE: Multidetector CT imaging of the abdomen and pelvis was performed
following the standard protocol without IV contrast.

[Series 2: axial st · axial · 0.69mm/px · z∈[-510,-110]mm · 14 of 88 slices shown, 16 images]
[im 4/88  soft-tissue]
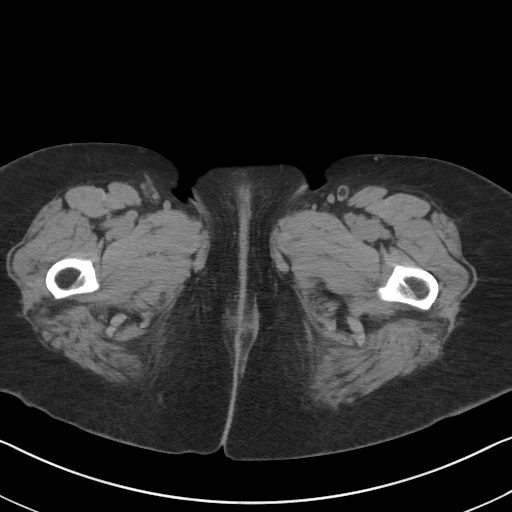
[im 4/88  bone]
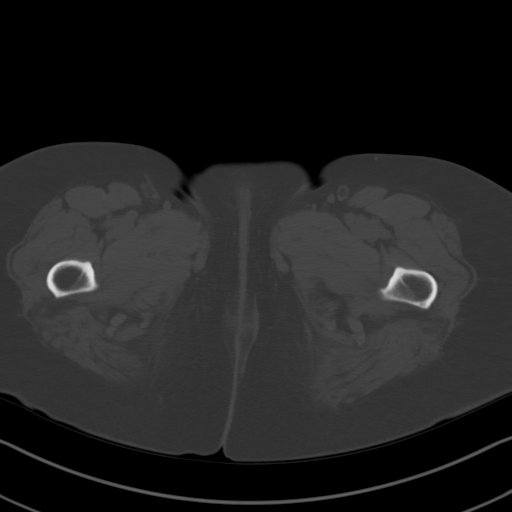
[im 11/88  soft-tissue]
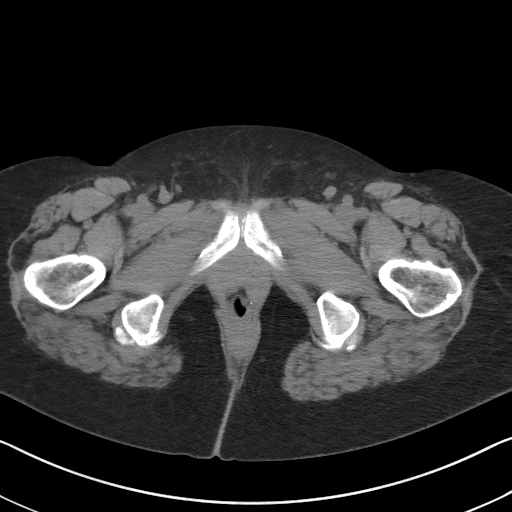
[im 18/88  soft-tissue]
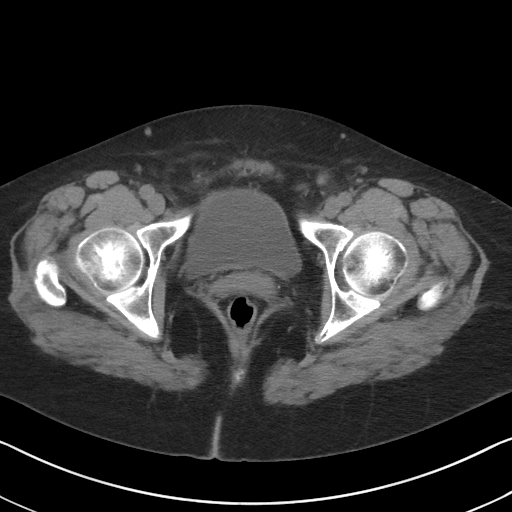
[im 25/88  soft-tissue]
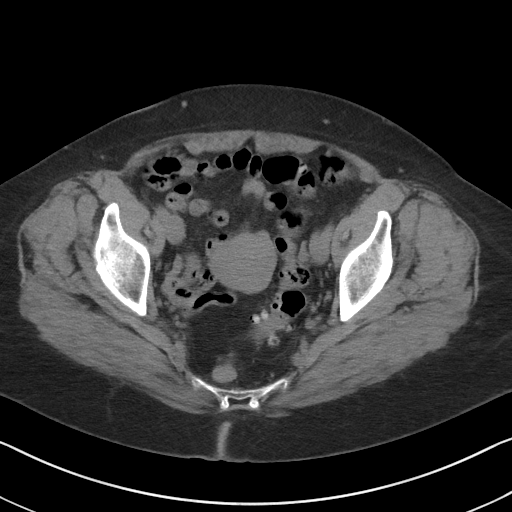
[im 28/88  soft-tissue]
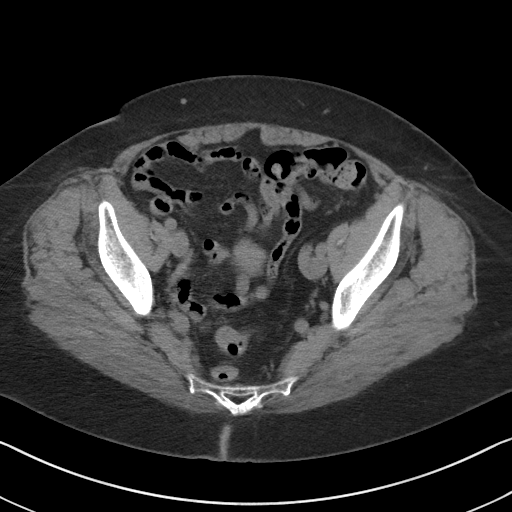
[im 35/88  soft-tissue]
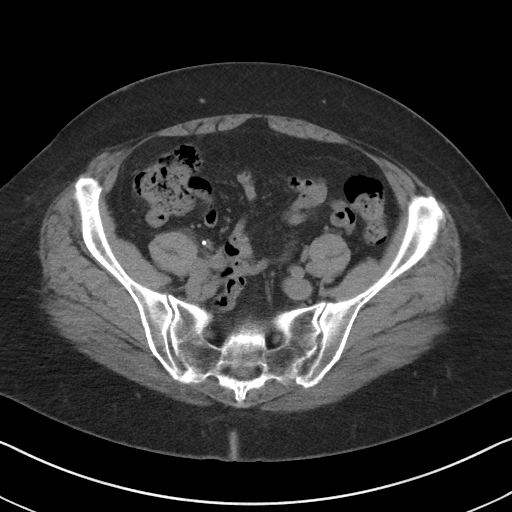
[im 42/88  soft-tissue]
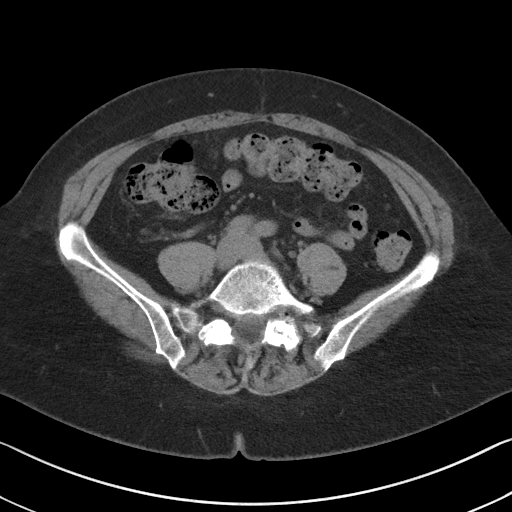
[im 46/88  soft-tissue]
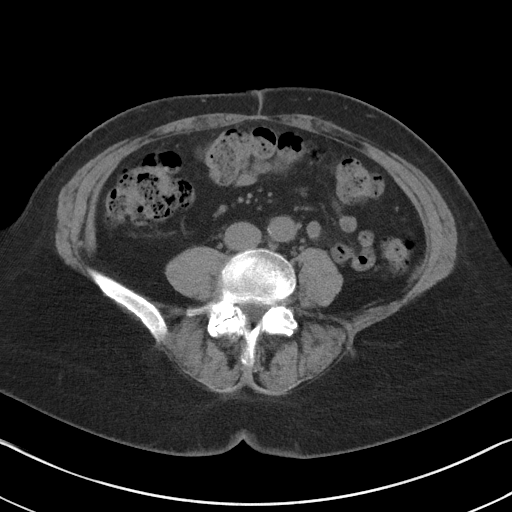
[im 53/88  soft-tissue]
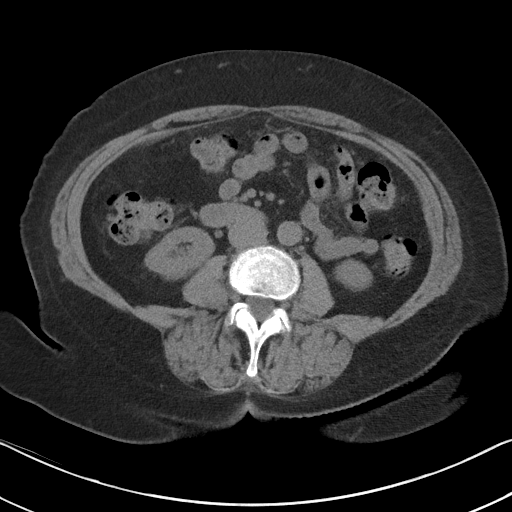
[im 53/88  bone]
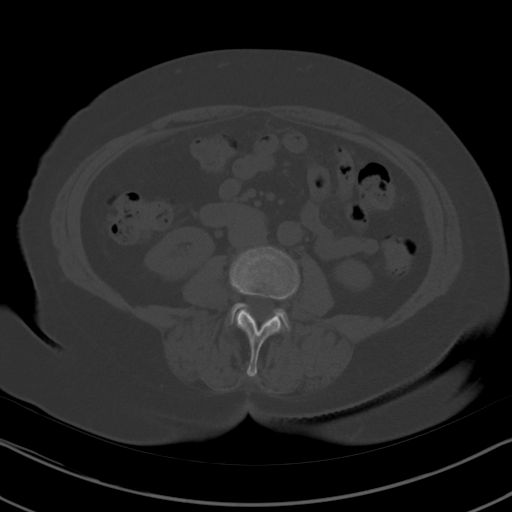
[im 60/88  soft-tissue]
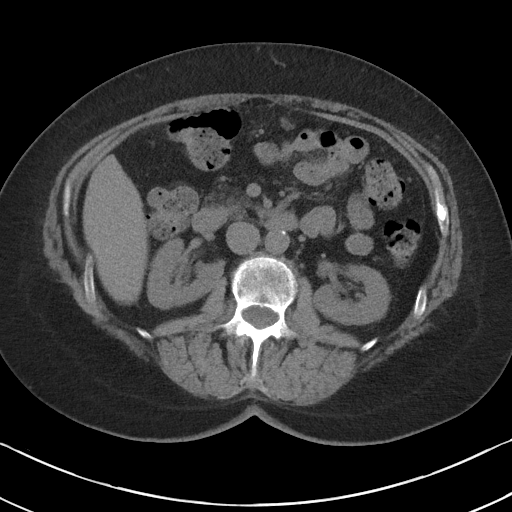
[im 67/88  soft-tissue]
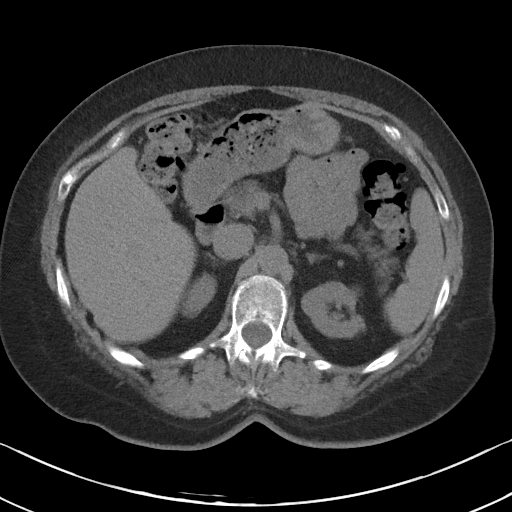
[im 70/88  soft-tissue]
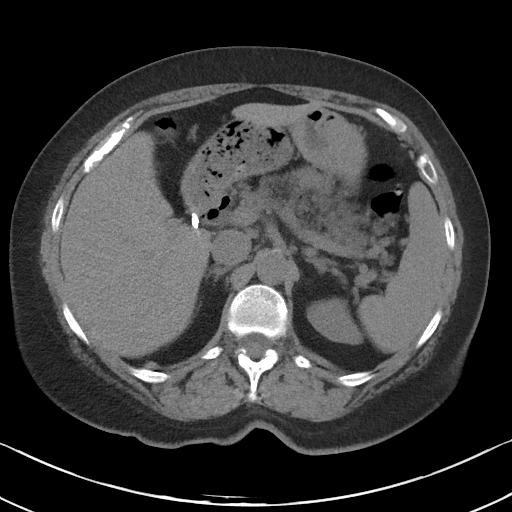
[im 77/88  soft-tissue]
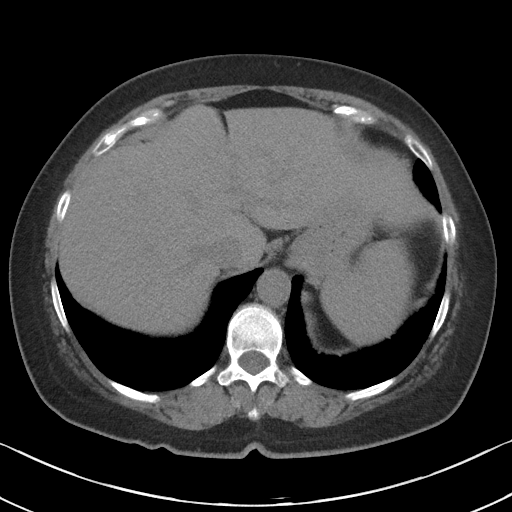
[im 84/88  soft-tissue]
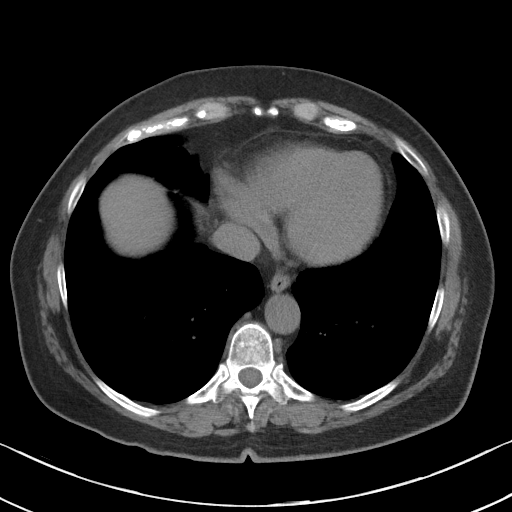

[Series 4: coronal st · coronal · 0.83mm/px · 3 of 85 slices shown]
[im 29/85  soft-tissue]
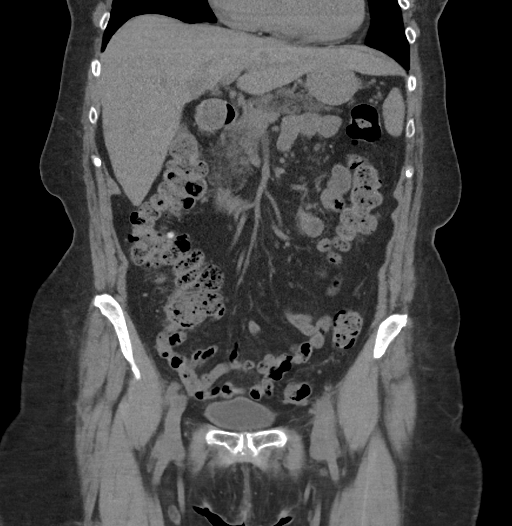
[im 38/85  soft-tissue]
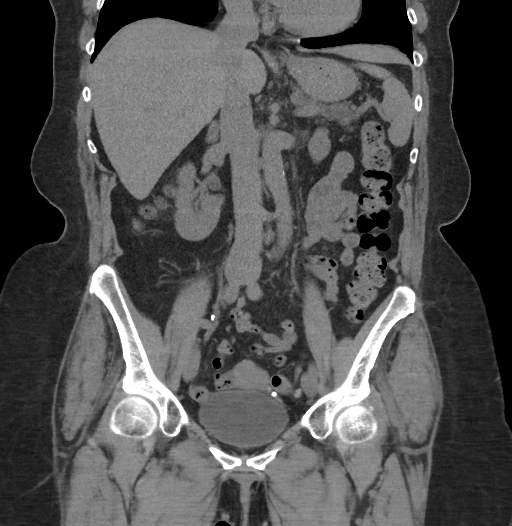
[im 47/85  soft-tissue]
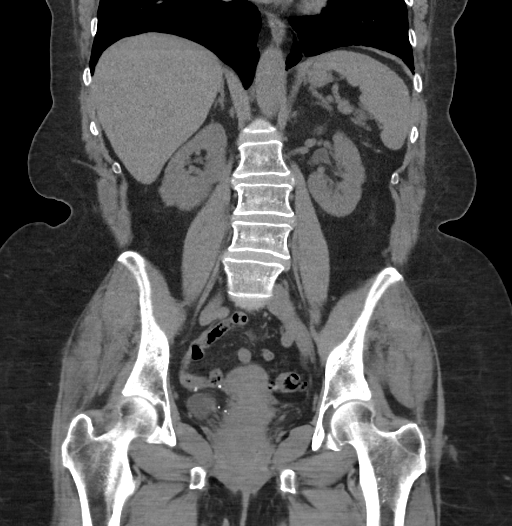

[17 of 46 positions shown; findings below may reference images not displayed]

FINDINGS: Lower chest: No acute abnormality.

Hepatobiliary: No focal liver abnormality is seen. Status post
cholecystectomy. No biliary dilatation.

Pancreas: Unremarkable. No pancreatic ductal dilatation or
surrounding inflammatory changes.

Spleen: Within normal limits.

Adrenals/Urinary Tract: Adrenal glands are within normal limits.
Some scarring is noted in the upper pole of the left kidney stable
from the previous exam. No renal calculi or obstructive changes are
seen. The bladder is partially distended.

Stomach/Bowel: Scattered diverticular change of the colon is noted
without evidence of diverticulitis. No obstructive or inflammatory
changes of the colon are seen. The appendix has been surgically
removed. Small bowel and stomach are within normal limits.

Vascular/Lymphatic: Aortic atherosclerosis. No enlarged abdominal or
pelvic lymph nodes.

Reproductive: Uterus and bilateral adnexa are unremarkable.

Other: No abdominal wall hernia or abnormality. No abdominopelvic
ascites.

Musculoskeletal: Degenerative changes of lumbar spine are seen.
IMPRESSION: Diverticulosis without evidence of diverticulitis.

No acute abnormality noted.

## 2019-07-22 DIAGNOSIS — Z6830 Body mass index (BMI) 30.0-30.9, adult: Secondary | ICD-10-CM | POA: Diagnosis not present

## 2019-07-22 DIAGNOSIS — Z124 Encounter for screening for malignant neoplasm of cervix: Secondary | ICD-10-CM | POA: Diagnosis not present

## 2019-08-04 DIAGNOSIS — S32592A Other specified fracture of left pubis, initial encounter for closed fracture: Secondary | ICD-10-CM | POA: Diagnosis not present

## 2019-08-04 DIAGNOSIS — M545 Low back pain: Secondary | ICD-10-CM | POA: Diagnosis not present

## 2019-08-07 DIAGNOSIS — N390 Urinary tract infection, site not specified: Secondary | ICD-10-CM | POA: Diagnosis not present

## 2019-08-13 DIAGNOSIS — M25552 Pain in left hip: Secondary | ICD-10-CM | POA: Diagnosis not present

## 2019-09-15 ENCOUNTER — Other Ambulatory Visit: Payer: Self-pay | Admitting: Obstetrics & Gynecology

## 2019-09-16 ENCOUNTER — Other Ambulatory Visit: Payer: Self-pay | Admitting: Obstetrics & Gynecology

## 2019-09-16 DIAGNOSIS — T8549XA Other mechanical complication of breast prosthesis and implant, initial encounter: Secondary | ICD-10-CM

## 2019-10-13 ENCOUNTER — Encounter: Payer: Self-pay | Admitting: Cardiology

## 2019-10-13 ENCOUNTER — Ambulatory Visit: Payer: BLUE CROSS/BLUE SHIELD | Admitting: Cardiology

## 2019-10-13 ENCOUNTER — Ambulatory Visit (INDEPENDENT_AMBULATORY_CARE_PROVIDER_SITE_OTHER): Payer: Medicare Other | Admitting: Cardiology

## 2019-10-13 ENCOUNTER — Other Ambulatory Visit: Payer: Self-pay

## 2019-10-13 VITALS — BP 153/85 | HR 68 | Ht 67.0 in | Wt 205.0 lb

## 2019-10-13 DIAGNOSIS — R0989 Other specified symptoms and signs involving the circulatory and respiratory systems: Secondary | ICD-10-CM | POA: Diagnosis not present

## 2019-10-13 DIAGNOSIS — I447 Left bundle-branch block, unspecified: Secondary | ICD-10-CM | POA: Diagnosis not present

## 2019-10-13 DIAGNOSIS — I209 Angina pectoris, unspecified: Secondary | ICD-10-CM | POA: Diagnosis not present

## 2019-10-13 DIAGNOSIS — I10 Essential (primary) hypertension: Secondary | ICD-10-CM | POA: Diagnosis not present

## 2019-10-13 DIAGNOSIS — M79602 Pain in left arm: Secondary | ICD-10-CM | POA: Diagnosis not present

## 2019-10-13 DIAGNOSIS — R0609 Other forms of dyspnea: Secondary | ICD-10-CM

## 2019-10-13 DIAGNOSIS — R0789 Other chest pain: Secondary | ICD-10-CM | POA: Diagnosis not present

## 2019-10-13 DIAGNOSIS — R0600 Dyspnea, unspecified: Secondary | ICD-10-CM

## 2019-10-13 DIAGNOSIS — M79601 Pain in right arm: Secondary | ICD-10-CM | POA: Diagnosis not present

## 2019-10-13 MED ORDER — NITROGLYCERIN 0.4 MG SL SUBL: 0.4000 mg | SUBLINGUAL_TABLET | SUBLINGUAL | 3 refills | Status: DC | PRN

## 2019-10-13 MED ORDER — ROSUVASTATIN CALCIUM 10 MG PO TABS: 10.0000 mg | ORAL_TABLET | Freq: Every day | ORAL | 2 refills | Status: DC

## 2019-10-13 MED ORDER — ASPIRIN EC 81 MG PO TBEC: 81.0000 mg | DELAYED_RELEASE_TABLET | Freq: Every day | ORAL | 3 refills | Status: DC

## 2019-10-13 MED ORDER — AMLODIPINE BESYLATE 5 MG PO TABS: 5.0000 mg | ORAL_TABLET | Freq: Every day | ORAL | 2 refills | Status: DC

## 2019-10-13 NOTE — Patient Instructions (Signed)
Angina  Angina is extreme discomfort in the chest, neck, arm, jaw, or back. The discomfort is caused by a lack of blood in the middle layer of the heart wall (myocardium). There are four types of angina:  Stable angina. This is triggered by vigorous activity or exercise. It goes away when you rest or take angina medicine.  Unstable angina. This is a warning sign and can lead to a heart attack (acute coronary syndrome). This is a medical emergency. Symptoms come at rest and last a long time.  Microvascular angina. This affects the small coronary arteries. Symptoms include feeling tired and being short of breath.  Prinzmetal or variant angina. This is caused by a tightening (spasm) of the arteries that go to your heart. What are the causes? This condition is caused by atherosclerosis. This is the buildup of fat and cholesterol (plaque) in your arteries. The plaque may narrow or block the artery. Other causes of angina include:  Sudden tightening of the muscles of the arteries in the heart (coronary spasm).  Small artery disease (microvascular dysfunction).  Problems with any of your heart valves (heart valve disease).  A tear in an artery in your heart (coronary artery dissection).  Diseases of the heart muscle (cardiomyopathy), or other heart diseases. What increases the risk? You are more likely to develop this condition if you have:  High cholesterol.  High blood pressure (hypertension).  Diabetes.  A family history of heart disease.  An inactive (sedentary) lifestyle, or you do not exercise enough.  Depression.  Had radiation treatment to the left side of your chest. Other risk factors include:  Using tobacco.  Being obese.  Eating a diet high in saturated fats.  Being exposed to high stress or triggers of stress.  Using drugs, such as cocaine. Women have a greater risk for angina if:  They are older than 55.  They have gone through menopause (are  postmenopausal). What are the signs or symptoms? Common symptoms of this condition in both men and women may include:  Chest pain, which may: ? Feel like a crushing or squeezing in the chest, or like a tightness, pressure, fullness, or heaviness in the chest. ? Last for more than a few minutes at a time, or it may stop and come back (recur) over the course of a few minutes.  Pain in the neck, arm, jaw, or back.  Unexplained heartburn or indigestion.  Shortness of breath.  Nausea.  Sudden cold sweats. Women and people with diabetes may have unusual (atypical) symptoms, such as:  Fatigue.  Unexplained feelings of nervousness or anxiety.  Unexplained weakness.  Dizziness or fainting. How is this diagnosed? This condition may be diagnosed based on:  Your symptoms and medical history.  Electrocardiogram (ECG) to measure the electrical activity in your heart.  Blood tests.  Stress test to look for signs of blockage when your heart is stressed.  CT angiogram to examine your heart and the blood flow to it.  Coronary angiogram to check your coronary arteries for blockage. How is this treated? Angina may be treated with:  Medicines to: ? Prevent blood clots and heart attack. ? Relax blood vessels and improve blood flow to the heart (nitrates). ? Reduce blood pressure, improve the pumping action of the heart, and relax blood vessels that are spasming. ? Reduce cholesterol and help treat atherosclerosis.  A procedure to widen a narrowed or blocked coronary artery (angioplasty). A mesh tube may be placed in a coronary artery to   keep it open (coronary stenting).  Surgery to allow blood to go around a blocked artery (coronary artery bypass surgery). Follow these instructions at home: Medicines  Take over-the-counter and prescription medicines only as told by your health care provider.  Do not take the following medicines unless your health care provider approves: ? NSAIDs,  such as ibuprofen or naproxen. ? Vitamin supplements that contain vitamin A, vitamin E, or both. ? Hormone replacement therapy that contains estrogen with or without progestin. Eating and drinking   Eat a heart-healthy diet. This includes plenty of fresh fruits and vegetables, whole grains, low-fat (lean) protein, and low-fat dairy products.  Follow instructions from your health care provider about eating or drinking restrictions. Activity  Follow an exercise program approved by your health care provider.  Consider joining a cardiac rehabilitation program.  Take a break when you feel fatigued. Plan rest periods in your daily activities. Lifestyle   Do not use any products that contain nicotine or tobacco, such as cigarettes, e-cigarettes, and chewing tobacco. If you need help quitting, ask your health care provider.  If your health care provider says you can drink alcohol: ? Limit how much you use to:  0-1 drink a day for nonpregnant women.  0-2 drinks a day for men. ? Be aware of how much alcohol is in your drink. In the [place] one drink equals one 12 oz bottle of beer (355 mL), one 5 oz glass of wine (148 mL), or one 1 oz glass of hard liquor (44 mL). General instructions  Maintain a healthy weight.  Learn to manage stress.  Keep your vaccinations up to date. Get the flu (influenza) vaccine every year.  Talk to your health care provider if you feel depressed. Take a depression screening test to see if you are at risk for depression.  Work with your health care provider to manage other health conditions, such as hypertension or diabetes.  Keep all follow-up visits as told by your health care provider. This is important. Get help right away if:  You have pain in your chest, neck, arm, jaw, or back, and the pain: ? Lasts more than a few minutes. ? Is recurring. ? Is not relieved by taking medicines under the tongue (sublingual nitroglycerin). ? Increases in intensity or  frequency.  You have a lot of sweating without cause.  You have unexplained: ? Heartburn or indigestion. ? Shortness of breath or difficulty breathing. ? Nausea or vomiting. ? Fatigue. ? Feelings of nervousness or anxiety. ? Weakness.  You have sudden light-headedness or dizziness.  You faint. These symptoms may represent a serious problem that is an emergency. Do not wait to see if the symptoms will go away. Get medical help right away. Call your local emergency services (911 in the [place] Do not drive yourself to the hospital. Summary  Angina is extreme discomfort in the chest, neck, arm, jaw, or back that is caused by a lack of blood in the heart wall.  There are many symptoms of angina. They include chest pain, unexplained heartburn or indigestion, sudden cold sweats, and fatigue.  Angina may be treated with behavioral changes, medicine, or surgery.  Symptoms of angina may represent an emergency. Get medical help right away. Call your local emergency services (911 in the [place] Do not drive yourself to the hospital. This information is not intended to replace advice given to you by your health care provider. Make sure you discuss any questions you have with your health care provider.   Document Revised: 04/29/2018 Document Reviewed: 04/29/2018 [employer]vier Patient Education  2020 [employer]  

## 2019-10-13 NOTE — Progress Notes (Deleted)
Patient referred by [name], MD for ***  Subjective:   Amanda Lambert, female    DOB: [birth year], 73 y.o.   MRN: [id number]  *** No chief complaint on file.   *** HPI  74 y.o. *** female with ***  *** Past Medical History:  Diagnosis Date  . Arthritis    right elbow  . Biliary dyskinesia   . Constipation   . GERD (gastroesophageal reflux disease)   . Hepatitis B 1975  . Hypertension   . Nausea   . PONV (postoperative nausea and vomiting)    slow to wake up  . [place] spotted fever 04/01/2012   adm. to hospital  . Weakness     *** Past Surgical History:  Procedure Laterality Date  . APPENDECTOMY  1963  . CHOLECYSTECTOMY  04/26/2012   Procedure: LAPAROSCOPIC CHOLECYSTECTOMY WITH INTRAOPERATIVE CHOLANGIOGRAM;  Surgeon: Merrie Roof, MD;  Location: [facility];  Service: General;  Laterality: N/A;  . DILATION AND CURETTAGE OF UTERUS  1982   for miscarriage  . DILATION AND CURETTAGE, DIAGNOSTIC / THERAPEUTIC  1972  . TONSILLECTOMY  1964  - approximate  . WRIST FRACTURE SURGERY  2008   right    *** Social History   Tobacco Use  Smoking Status Never Smoker  Smokeless Tobacco Never Used    Social History   Substance and Sexual Activity  Alcohol Use No    *** Family History  Problem Relation Age of Onset  . Cardiomyopathy Mother   . Coronary artery disease Father     *** Current Outpatient Medications on File Prior to Visit  Medication Sig Dispense Refill  . cholestyramine (QUESTRAN) 4 G packet Take 4 g by mouth daily.    . colestipol (COLESTID) 1 g tablet     . glycopyrrolate (ROBINUL) 2 MG tablet     . HYDROcodone-acetaminophen (NORCO/VICODIN) 5-325 MG tablet hydrocodone 5 mg-acetaminophen 325 mg tablet  TAKE 1 TABLET BY MOUTH EVERY 6-8 HOURS AS NEEDED FOR PAIN    . meloxicam (MOBIC) 15 MG tablet Take 1 tablet (15 mg total) by mouth daily. 30 tablet 3  . metoprolol (LOPRESSOR) 50 MG tablet Take 25 mg by mouth 2 (two) times daily.       . pantoprazole (PROTONIX) 40 MG tablet Take 40 mg by mouth daily.    . predniSONE (DELTASONE) 10 MG tablet     . thiamine (VITAMIN B-1) 100 MG tablet Take 100 mg by mouth daily.    Marland Kitchen triamterene-hydrochlorothiazide (MAXZIDE) 75-50 MG per tablet Take 1 tablet by mouth every morning.     . triamterene-hydrochlorothiazide (MAXZIDE-25) 37.5-25 MG tablet     . vitamin B-12 (CYANOCOBALAMIN) 100 MCG tablet Vitamin B12     No current facility-administered medications on file prior to visit.    Cardiovascular and other pertinent studies:  *** EKG ***/***/202***: ***  *** Recent labs: 12/10/2018: Glucose 102, BUN/Cr 13/0.89. EGFR >60. Na/K 135/3.7. Rest of the CMP normal H/H 12.6/39.4. MCV 87.9. Platelets 303  *** ROS      *** There were no vitals filed for this visit.  *** There is no height or weight on file to calculate BMI. There were no vitals filed for this visit.  *** Objective:   Physical Exam    ***     Assessment & Recommendations:   ***  ***     Thank you for referring the patient to [place]. Please feel free to contact with any questions.    Esther Hardy, MD [facility] [place] Pager: [phone] Office: [phone]

## 2019-10-13 NOTE — Progress Notes (Signed)
Primary Physician/Referring:  [name], MD  Patient ID: Amanda Lambert, female    DOB: July 21, 1946, 74 y.o.   MRN: [id number]  Chief Complaint  Patient presents with  . Chest Pain   HPI:    Amanda Lambert  is a 74 y.o. With long-standing history of LBBB, hypertension, no known hyperlipidemia or diabetes mellitus, referred to me on an urgent basis for evaluation of angina pectoris.  Patient with the past 2 weeks has been having tightness in both the upper arm area while she was at home resting, episode lasted for a few minutes.  Since then she has had several episodes of arm tightness and also mild chest discomfort.  She uses sublingual nitroglycerin that was prescribed to her husband with immediate relief of chest discomfort.  Since then she is also noticed exertional chest tightness and also shortness of breath that is new to her.  She broke her left pelvis area and has not been as active since October 2020.  No dizziness or syncope, no leg edema.  Past Medical History:  Diagnosis Date  . Arthritis    right elbow  . Biliary dyskinesia   . Constipation   . GERD (gastroesophageal reflux disease)   . Hepatitis B 1975  . Hypertension   . Nausea   . PONV (postoperative nausea and vomiting)    slow to wake up  . [place] spotted fever 04/01/2012   adm. to hospital  . Weakness    Past Surgical History:  Procedure Laterality Date  . APPENDECTOMY  1963  . CHOLECYSTECTOMY  04/26/2012   Procedure: LAPAROSCOPIC CHOLECYSTECTOMY WITH INTRAOPERATIVE CHOLANGIOGRAM;  Surgeon: Merrie Roof, MD;  Location: [facility];  Service: General;  Laterality: N/A;  . DILATION AND CURETTAGE OF UTERUS  1982   for miscarriage  . DILATION AND CURETTAGE, DIAGNOSTIC / THERAPEUTIC  1972  . TONSILLECTOMY  1964  - approximate  . WRIST FRACTURE SURGERY  2008   right   Social History   Tobacco Use  . Smoking status: Never Smoker  . Smokeless tobacco: Never Used  Substance Use Topics  . Alcohol  use: No    ROS  Review of Systems  Constitution: Negative for weight gain.  Cardiovascular: Positive for chest pain and dyspnea on exertion. Negative for claudication, leg swelling and syncope.  Respiratory: Negative for hemoptysis.   Endocrine: Negative for cold intolerance.  Hematologic/Lymphatic: Does not bruise/bleed easily.  Musculoskeletal: Positive for joint pain (left hip).  Gastrointestinal: Negative for hematochezia and melena.  Neurological: Negative for headaches and light-headedness.   Objective  Blood pressure (!) 153/85, pulse 68, height '5\' 7"'$  (1.702 m), weight 205 lb (93 kg), SpO2 98 %.  Vitals with BMI 10/13/2019 12/10/2018 12/10/2018  Height '5\' 7"'$  - '5\' 7"'$   Weight 205 lbs - 190 lbs  BMI 38.8 - 87.57  Systolic 972 820 601  Diastolic 85 81 94  Pulse 68 56 60  Some encounter information is confidential and restricted. Go to Review Flowsheets activity to see all data.     Physical Exam  Constitutional:  Well-built and mildly obese in no acute distress.  Neck: No thyromegaly present.  Cardiovascular: Normal rate, regular rhythm, normal heart sounds and intact distal pulses. Exam reveals no gallop.  No murmur heard. Pulses:      Carotid pulses are on the right side with bruit. No leg edema, no JVD. Prominent right carotid bruit.  Otherwise normal vascular exam.  Pulmonary/Chest: Effort normal and breath sounds  normal.  Abdominal: Soft. Bowel sounds are normal.  Musculoskeletal:     Cervical back: Neck supple.  Skin: Skin is warm and dry.   Laboratory examination:   Recent Labs    12/10/18 1622  NA 135  K 3.7  CL 100  CO2 26  GLUCOSE 102*  BUN 13  CREATININE 0.89  CALCIUM 8.8*  GFRNONAA >60  GFRAA >60   CrCl cannot be calculated (Patient's most recent lab result is older than the maximum 21 days allowed.).  CMP Latest Ref Rng & Units 12/10/2018 08/28/2016 04/04/2014  Glucose 70 - 99 mg/dL 102(H) 91 114(H)  BUN 8 - 23 mg/dL '13 10 12  '$ Creatinine 0.44 -  1.00 mg/dL 0.89 0.98 0.82  Sodium 135 - 145 mmol/L 135 139 139  Potassium 3.5 - 5.1 mmol/L 3.7 3.9 3.8  Chloride 98 - 111 mmol/L 100 103 100  CO2 22 - 32 mmol/L '26 28 21  '$ Calcium 8.9 - 10.3 mg/dL 8.8(L) 9.3 9.2  Total Protein 6.5 - 8.1 g/dL 7.0 - -  Total Bilirubin 0.3 - 1.2 mg/dL 0.5 - -  Alkaline Phos 38 - 126 U/L 88 - -  AST 15 - 41 U/L 19 - -  ALT 0 - 44 U/L 12 - -   CBC Latest Ref Rng & Units 12/10/2018 08/28/2016 04/04/2014  WBC 4.0 - 10.5 K/uL 6.0 5.3 8.0  Hemoglobin 12.0 - 15.0 g/dL 12.6 12.5 13.1  Hematocrit 36.0 - 46.0 % 39.4 38.7 39.6  Platelets 150 - 400 K/uL 303 280 300   Lipid Panel  No results found for: CHOL, TRIG, HDL, CHOLHDL, VLDL, LDLCALC, LDLDIRECT HEMOGLOBIN A1C No results found for: HGBA1C, MPG TSH No results for input(s): TSH in the last 8760 hours.  Medications and allergies   Allergies  Allergen Reactions  . Ace Inhibitors Anaphylaxis    Angioedema.  . Thorazine [Chlorpromazine] Anaphylaxis  . Levaquin [Levofloxacin In D5w] Nausea And Vomiting    Stated by patient she could not handle levaquin even with antiemetics   . Shrimp [Shellfish Allergy] Rash and Hives    "splotches"   . Anticoagulant Compound Other (See Comments)    intolerance  . Bee Venom   . Compazine [Prochlorperazine Edisylate] Other (See Comments)    Becomes anxious.  . Sulfur     Itchy   . Latex Other (See Comments) and Rash    intolerance     Current Outpatient Medications  Medication Instructions  . amLODipine (NORVASC) 5 mg, Oral, Daily  . aspirin EC 81 mg, Oral, Daily  . meloxicam (MOBIC) 15 mg, Oral, Daily  . metoprolol tartrate (LOPRESSOR) 25 mg, Oral, 2 times daily  . nitroGLYCERIN (NITROSTAT) 0.4 mg, Sublingual, Every 5 min PRN  . pantoprazole (PROTONIX) 40 mg, Daily  . rosuvastatin (CRESTOR) 10 mg, Oral, Daily  . thiamine (VITAMIN B-1) 100 mg, Oral, Daily  . triamterene-hydrochlorothiazide (MAXZIDE-25) 37.5-25 MG tablet No dose, route, or frequency recorded.  .  vitamin B-12 (CYANOCOBALAMIN) 100 MCG tablet Vitamin B12  . VITAMIN D PO Oral, 4,000 units daily     Radiology:  No results found.  Cardiac Studies:   None  Assessment     ICD-10-CM   1. Angina pectoris (HCC)  I20.9 EKG 12-Lead    PCV ECHOCARDIOGRAM COMPLETE    PCV MYOCARDIAL PERFUSION WITH LEXISCAN    amLODipine (NORVASC) 5 MG tablet    nitroGLYCERIN (NITROSTAT) 0.4 MG SL tablet    aspirin EC 81 MG tablet    rosuvastatin (CRESTOR)  10 MG tablet  2. LBBB (left bundle branch block)  I44.7 PCV MYOCARDIAL PERFUSION WITH LEXISCAN  3. Dyspnea on exertion  R06.00   4. Right carotid bruit  R09.89 PCV CAROTID DUPLEX (BILATERAL)    aspirin EC 81 MG tablet    rosuvastatin (CRESTOR) 10 MG tablet  5. Primary hypertension  I10 Lipid Panel With LDL/HDL Ratio    TSH    CBC    CMP14+EGFR    amLODipine (NORVASC) 5 MG tablet    EKG 10/13/2019: Sinus rhythm with first-degree AV block at the rate of 70 bpm, LBBB.  No further analysis.  No significant change from 08/28/2016.    Meds ordered this encounter  Medications  . amLODipine (NORVASC) 5 MG tablet    Sig: Take 1 tablet (5 mg total) by mouth daily.    Dispense:  30 tablet    Refill:  2  . nitroGLYCERIN (NITROSTAT) 0.4 MG SL tablet    Sig: Place 1 tablet (0.4 mg total) under the tongue every 5 (five) minutes as needed for up to 25 days for chest pain.    Dispense:  25 tablet    Refill:  3  . aspirin EC 81 MG tablet    Sig: Take 1 tablet (81 mg total) by mouth daily.    Dispense:  90 tablet    Refill:  3  . rosuvastatin (CRESTOR) 10 MG tablet    Sig: Take 1 tablet (10 mg total) by mouth daily.    Dispense:  30 tablet    Refill:  2    Medications Discontinued During This Encounter  Medication Reason  . triamterene-hydrochlorothiazide (MAXZIDE) 75-50 MG per tablet Error  . predniSONE (DELTASONE) 10 MG tablet Error  . cholestyramine (QUESTRAN) 4 G packet Error  . colestipol (COLESTID) 1 g tablet Error  . glycopyrrolate (ROBINUL)  2 MG tablet Error  . HYDROcodone-acetaminophen (NORCO/VICODIN) 5-325 MG tablet Error    Recommendations:   Amanda Lambert  is a 74 y.o. o. With long-standing history of LBBB, hypertension, no known hyperlipidemia or diabetes mellitus, referred to me on an urgent basis for evaluation of angina pectoris.   Symptoms of arm tightness, and occasional chest tightness with exertion activity, new onset dyspnea or the past 2 weeks or so, I'll very indicated of angina pectoris.  I have started her on amlodipine 5 mg daily. S/L NTG was prescribed and explained how to and when to use it and to notify us if there is change in frequency of use. She also has a very prominent right carotid bruit. Patient instructed to start ASA  '81mg'$  q daily for prophylaxis.   As she has not had her annual physical exam this year, Labs Not Been Done for a Year Now, I Will Take the [place] of Ordering All the Labs Including Lipid Profile Testing and Send a Copy to Dr. Inda Merlin.  Schedule for carotid duplex for bruit/follow-up surveillance of carotid stenosis. Schedule for a Lexiscan Sestamibi stress test to evaluate for myocardial ischemia. Patient unable to do treadmill stress testing due to left pelvic fracture and LBBB.  Will schedule for an echocardiogram. Office visit following the work-up/investigations. Adrian Prows, MD, [facility] 10/13/2019, 2:42 PM [facility] [place] Office: [phone]

## 2019-10-14 DIAGNOSIS — I10 Essential (primary) hypertension: Secondary | ICD-10-CM | POA: Diagnosis not present

## 2019-10-15 ENCOUNTER — Ambulatory Visit (INDEPENDENT_AMBULATORY_CARE_PROVIDER_SITE_OTHER): Payer: Medicare Other

## 2019-10-15 DIAGNOSIS — I6523 Occlusion and stenosis of bilateral carotid arteries: Secondary | ICD-10-CM

## 2019-10-15 DIAGNOSIS — R0989 Other specified symptoms and signs involving the circulatory and respiratory systems: Secondary | ICD-10-CM

## 2019-10-15 DIAGNOSIS — I209 Angina pectoris, unspecified: Secondary | ICD-10-CM

## 2019-10-15 DIAGNOSIS — I779 Disorder of arteries and arterioles, unspecified: Secondary | ICD-10-CM

## 2019-10-15 DIAGNOSIS — I5189 Other ill-defined heart diseases: Secondary | ICD-10-CM

## 2019-10-15 HISTORY — DX: Other ill-defined heart diseases: I51.89

## 2019-10-15 HISTORY — DX: Disorder of arteries and arterioles, unspecified: I77.9

## 2019-10-15 LAB — CMP14+EGFR
ALT: 11 IU/L (ref 0–32)
AST: 15 IU/L (ref 0–40)
Albumin/Globulin Ratio: 1.6 (ref 1.2–2.2)
Albumin: 4.1 g/dL (ref 3.7–4.7)
Alkaline Phosphatase: 104 IU/L (ref 39–117)
BUN/Creatinine Ratio: 13 (ref 12–28)
BUN: 13 mg/dL (ref 8–27)
Bilirubin Total: 0.5 mg/dL (ref 0.0–1.2)
CO2: 25 mmol/L (ref 20–29)
Calcium: 9.7 mg/dL (ref 8.7–10.3)
Chloride: 102 mmol/L (ref 96–106)
Creatinine, Ser: 0.99 mg/dL (ref 0.57–1.00)
GFR calc Af Amer: 66 mL/min/{1.73_m2} (ref >59)
GFR calc non Af Amer: 57 mL/min/{1.73_m2} — ABNORMAL LOW (ref >59)
Globulin, Total: 2.5 g/dL (ref 1.5–4.5)
Glucose: 93 mg/dL (ref 65–99)
Potassium: 3.9 mmol/L (ref 3.5–5.2)
Sodium: 141 mmol/L (ref 134–144)
Total Protein: 6.6 g/dL (ref 6.0–8.5)

## 2019-10-15 LAB — CBC
Hematocrit: 37.3 % (ref 34.0–46.6)
Hemoglobin: 12.8 g/dL (ref 11.1–15.9)
MCH: 29.6 pg (ref 26.6–33.0)
MCHC: 34.3 g/dL (ref 31.5–35.7)
MCV: 86 fL (ref 79–97)
Platelets: 329 10*3/uL (ref 150–450)
RBC: 4.33 x10E6/uL (ref 3.77–5.28)
RDW: 12 % (ref 11.7–15.4)
WBC: 6.6 10*3/uL (ref 3.4–10.8)

## 2019-10-15 LAB — TSH: TSH: 1.33 u[IU]/mL (ref 0.450–4.500)

## 2019-10-15 LAB — LIPID PANEL WITH LDL/HDL RATIO
Cholesterol, Total: 174 mg/dL (ref 100–199)
HDL: 88 mg/dL (ref >39)
LDL Chol Calc (NIH): 72 mg/dL (ref 0–99)
LDL/HDL Ratio: 0.8 ratio (ref 0.0–3.2)
Triglycerides: 73 mg/dL (ref 0–149)
VLDL Cholesterol Cal: 14 mg/dL (ref 5–40)

## 2019-10-16 ENCOUNTER — Other Ambulatory Visit: Payer: Self-pay

## 2019-10-16 ENCOUNTER — Ambulatory Visit
Admission: RE | Admit: 2019-10-16 | Discharge: 2019-10-16 | Disposition: A | Discharge status: Home / Self Care | Payer: Medicare Other | Source: Ambulatory Visit | Attending: Obstetrics & Gynecology | Admitting: Obstetrics & Gynecology

## 2019-10-16 DIAGNOSIS — T8549XA Other mechanical complication of breast prosthesis and implant, initial encounter: Secondary | ICD-10-CM | POA: Diagnosis not present

## 2019-10-17 ENCOUNTER — Encounter (HOSPITAL_COMMUNITY): Payer: Self-pay | Admitting: Emergency Medicine

## 2019-10-17 ENCOUNTER — Emergency Department (HOSPITAL_COMMUNITY): Payer: Medicare Other

## 2019-10-17 ENCOUNTER — Other Ambulatory Visit: Payer: Self-pay

## 2019-10-17 ENCOUNTER — Emergency Department (HOSPITAL_COMMUNITY)
Admission: EM | Admit: 2019-10-17 | Discharge: 2019-10-17 | Disposition: A | Discharge status: Home / Self Care | Payer: Medicare Other | Attending: Emergency Medicine | Admitting: Emergency Medicine

## 2019-10-17 DIAGNOSIS — Z91040 Latex allergy status: Secondary | ICD-10-CM | POA: Insufficient documentation

## 2019-10-17 DIAGNOSIS — I10 Essential (primary) hypertension: Secondary | ICD-10-CM | POA: Diagnosis not present

## 2019-10-17 DIAGNOSIS — R072 Precordial pain: Secondary | ICD-10-CM | POA: Diagnosis not present

## 2019-10-17 DIAGNOSIS — I200 Unstable angina: Secondary | ICD-10-CM | POA: Diagnosis not present

## 2019-10-17 DIAGNOSIS — Z79899 Other long term (current) drug therapy: Secondary | ICD-10-CM | POA: Insufficient documentation

## 2019-10-17 DIAGNOSIS — I447 Left bundle-branch block, unspecified: Secondary | ICD-10-CM | POA: Diagnosis not present

## 2019-10-17 DIAGNOSIS — R42 Dizziness and giddiness: Secondary | ICD-10-CM | POA: Diagnosis not present

## 2019-10-17 DIAGNOSIS — I259 Chronic ischemic heart disease, unspecified: Secondary | ICD-10-CM | POA: Diagnosis not present

## 2019-10-17 DIAGNOSIS — R0789 Other chest pain: Secondary | ICD-10-CM | POA: Diagnosis not present

## 2019-10-17 DIAGNOSIS — Z7982 Long term (current) use of aspirin: Secondary | ICD-10-CM | POA: Insufficient documentation

## 2019-10-17 DIAGNOSIS — R5383 Other fatigue: Secondary | ICD-10-CM | POA: Diagnosis not present

## 2019-10-17 DIAGNOSIS — R55 Syncope and collapse: Secondary | ICD-10-CM

## 2019-10-17 DIAGNOSIS — I491 Atrial premature depolarization: Secondary | ICD-10-CM | POA: Diagnosis not present

## 2019-10-17 DIAGNOSIS — I201 Angina pectoris with documented spasm: Secondary | ICD-10-CM | POA: Diagnosis not present

## 2019-10-17 DIAGNOSIS — R61 Generalized hyperhidrosis: Secondary | ICD-10-CM | POA: Diagnosis not present

## 2019-10-17 DIAGNOSIS — R079 Chest pain, unspecified: Secondary | ICD-10-CM | POA: Diagnosis not present

## 2019-10-17 HISTORY — DX: Syncope and collapse: R55

## 2019-10-17 LAB — CBC
HCT: 42.3 % (ref 36.0–46.0)
Hemoglobin: 13.7 g/dL (ref 12.0–15.0)
MCH: 28.7 pg (ref 26.0–34.0)
MCHC: 32.4 g/dL (ref 30.0–36.0)
MCV: 88.7 fL (ref 80.0–100.0)
Platelets: 340 10*3/uL (ref 150–400)
RBC: 4.77 MIL/uL (ref 3.87–5.11)
RDW: 12.1 % (ref 11.5–15.5)
WBC: 6.1 10*3/uL (ref 4.0–10.5)
nRBC: 0 % (ref 0.0–0.2)

## 2019-10-17 LAB — BASIC METABOLIC PANEL
Anion gap: 11 (ref 5–15)
BUN: 13 mg/dL (ref 8–23)
CO2: 26 mmol/L (ref 22–32)
Calcium: 9.6 mg/dL (ref 8.9–10.3)
Chloride: 104 mmol/L (ref 98–111)
Creatinine, Ser: 1.04 mg/dL — ABNORMAL HIGH (ref 0.44–1.00)
GFR calc Af Amer: >60 mL/min (ref >60)
GFR calc non Af Amer: 54 mL/min — ABNORMAL LOW (ref >60)
Glucose, Bld: 104 mg/dL — ABNORMAL HIGH (ref 70–99)
Potassium: 3.5 mmol/L (ref 3.5–5.1)
Sodium: 141 mmol/L (ref 135–145)

## 2019-10-17 LAB — TROPONIN I (HIGH SENSITIVITY)
Troponin I (High Sensitivity): 5 ng/L (ref <18)
Troponin I (High Sensitivity): 10 ng/L (ref <18)

## 2019-10-17 MED ORDER — SODIUM CHLORIDE 0.9% FLUSH: 3.0000 mL | Freq: Once | INTRAVENOUS | Status: DC

## 2019-10-17 NOTE — ED Triage Notes (Signed)
Patient presents to the ED by EMS with c/o with chest pain since 0600. She took 81 mg PTA of ems. Monday dx with angina and given nitro. This morning sudden onset of chest pain to the center of her chest w/o radiation, she took 3 nitro without relief, she reports her arms feel heavy. Given 243 ASA LBB on ekg

## 2019-10-17 NOTE — Discharge Instructions (Addendum)
Please do not do anything heavy exertional activity at home until you are seen by your Cardiologist. If your chest pain returns and does not respond to nitro tablets you should call EMS or seek care immediately.

## 2019-10-17 NOTE — ED Provider Notes (Signed)
[facility] EMERGENCY DEPARTMENT Provider Note   CSN: [id number] Arrival date & time: 10/17/19  1516     History Chief Complaint  Patient presents with  . Chest Pain    Amanda Lambert is a 74 y.o. female.  HPI Amanda Lambert is a 74y/o female with PMH of HTN, GERD, Arthritis, and IBS who presents to the ED today for an episode of chest pain that radiates to both her arms. She states she has been having chest pain for the past week but has not had chest pain "like this ever before". She describes the pain as pressure in the center of her chest that radiates to both arms, lasting about a minute and occurring at least once per day. She states overall these episodes of chest pain have been getting better. Her chest pain episode this morning was rated at a 3/10. She did not get better after taking 3 nitro pills, became diaphoretic, felt light headed and thus EMS was called. Now, she states her chest pain has resolved. She denies back pain, difficulty breathing, history of blood clots, no recent surgeries, no nausea, or vomiting with these episodes.     Past Medical History:  Diagnosis Date  . Arthritis    right elbow  . Biliary dyskinesia   . Constipation   . GERD (gastroesophageal reflux disease)   . Hepatitis B 1975  . Hypertension   . Nausea   . PONV (postoperative nausea and vomiting)    slow to wake up  . [place] spotted fever 04/01/2012   adm. to hospital  . Weakness     Patient Active Problem List   Diagnosis Date Noted  . Biliary dyskinesia 04/09/2012  . Fever 04/01/2012  . Transaminitis 04/01/2012  . UTI (lower urinary tract infection) 04/01/2012  . Leukopenia 04/01/2012  . Thrombocytopenia ([place]) 04/01/2012  . HTN (hypertension) 04/01/2012  . GERD (gastroesophageal reflux disease) 04/01/2012  . Oral thrush 04/01/2012    Past Surgical History:  Procedure Laterality Date  . APPENDECTOMY  1963  . CHOLECYSTECTOMY  04/26/2012   Procedure:  LAPAROSCOPIC CHOLECYSTECTOMY WITH INTRAOPERATIVE CHOLANGIOGRAM;  Surgeon: Merrie Roof, MD;  Location: [facility];  Service: General;  Laterality: N/A;  . DILATION AND CURETTAGE OF UTERUS  1982   for miscarriage  . DILATION AND CURETTAGE, DIAGNOSTIC / THERAPEUTIC  1972  . TONSILLECTOMY  1964  - approximate  . WRIST FRACTURE SURGERY  2008   right     OB History   No obstetric history on file.     Family History  Problem Relation Age of Onset  . Cardiomyopathy Mother   . Coronary artery disease Father   . Hypertension Brother   . Prostate cancer Brother     Social History   Tobacco Use  . Smoking status: Never Smoker  . Smokeless tobacco: Never Used  Substance Use Topics  . Alcohol use: No  . Drug use: No    Home Medications Prior to Admission medications   Medication Sig Start Date End Date Taking? Authorizing Provider  amLODipine (NORVASC) 5 MG tablet Take 1 tablet (5 mg total) by mouth daily. 10/13/19 01/11/20  Adrian Prows, MD  aspirin EC 81 MG tablet Take 1 tablet (81 mg total) by mouth daily. 10/13/19   Adrian Prows, MD  meloxicam (MOBIC) 15 MG tablet Take 1 tablet (15 mg total) by mouth daily. 04/11/17   Hyatt, Max T, DPM  metoprolol (LOPRESSOR) 50 MG tablet Take 25 mg  by mouth 2 (two) times daily.     [provider]  nitroGLYCERIN (NITROSTAT) 0.4 MG SL tablet Place 1 tablet (0.4 mg total) under the tongue every 5 (five) minutes as needed for up to 25 days for chest pain. 10/13/19 11/07/19  Adrian Prows, MD  pantoprazole (PROTONIX) 40 MG tablet Take 40 mg by mouth daily.    [provider]  rosuvastatin (CRESTOR) 10 MG tablet Take 1 tablet (10 mg total) by mouth daily. 10/13/19 01/11/20  Adrian Prows, MD  thiamine (VITAMIN B-1) 100 MG tablet Take 100 mg by mouth daily.    [provider]  triamterene-hydrochlorothiazide Bradd Burner) 37.5-25 MG tablet  01/22/17   [provider]  vitamin B-12 (CYANOCOBALAMIN) 100 MCG tablet Vitamin B12    [provider]  VITAMIN D PO Take by mouth. 4,000 units daily    [provider]    Allergies    Ace inhibitors, Thorazine [chlorpromazine], Levaquin [levofloxacin in d5w], Shrimp [shellfish allergy], Anticoagulant compound, Bee venom, Compazine [prochlorperazine edisylate], Sulfur, and Latex  Review of Systems   Review of Systems  Constitutional: Positive for diaphoresis and fatigue. Negative for activity change, chills and fever.  HENT: Negative for congestion, sinus pressure, sinus pain and sneezing.   Respiratory: Negative for cough, chest tightness, shortness of breath and wheezing.   Cardiovascular: Positive for chest pain and leg swelling. Negative for palpitations.  Gastrointestinal: Positive for constipation. Negative for abdominal pain, diarrhea, nausea and vomiting.  Genitourinary: Negative for dysuria, frequency and urgency.  Musculoskeletal: Negative for back pain, joint swelling, neck pain and neck stiffness.  Skin: Negative for color change and pallor.    Physical Exam Updated Vital Signs BP (!) 148/82   Pulse 71   Resp 20   SpO2 96%   Physical Exam Vitals reviewed.  Constitutional:      General: She is not in acute distress.    Appearance: She is not ill-appearing or toxic-appearing.  HENT:     Head: Normocephalic and atraumatic.  Eyes:     Extraocular Movements: Extraocular movements intact.     Pupils: Pupils are equal, round, and reactive to light.  Neck:     Vascular: No JVD.  Cardiovascular:     Rate and Rhythm: Normal rate and regular rhythm.     Pulses:          Radial pulses are 2+ on the right side and 2+ on the left side.     Heart sounds: Heart sounds are distant. No murmur. No systolic murmur. No diastolic murmur.  Pulmonary:     Effort: Pulmonary effort is normal. No tachypnea or respiratory distress.     Breath sounds: Normal breath sounds. No stridor. No wheezing, rhonchi or rales.  Chest:     Chest wall: No tenderness or  crepitus.  Abdominal:     General: Bowel sounds are normal.     Palpations: Abdomen is soft. There is no mass.     Tenderness: There is no abdominal tenderness. There is no guarding.  Musculoskeletal:     Cervical back: Neck supple.  Skin:    General: Skin is warm.     Capillary Refill: Capillary refill takes less than 2 seconds.     Findings: No ecchymosis or erythema.     Nails: There is no clubbing.  Neurological:     General: No focal deficit present.     Mental Status: She is alert.     ED Results / Procedures / Treatments  Labs (all labs ordered are listed, but only abnormal results are displayed) Labs Reviewed  BASIC METABOLIC PANEL - Abnormal; Notable for the following components:      Result Value   Glucose, Bld 104 (*)    Creatinine, Ser 1.04 (*)    GFR calc non Af Amer 54 (*)    All other components within normal limits  CBC  TROPONIN I (HIGH SENSITIVITY)  TROPONIN I (HIGH SENSITIVITY)    EKG EKG Interpretation  Date/Time:  Friday October 17 2019 15:30:17 EST Ventricular Rate:  73 PR Interval:  180 QRS Duration: 134 QT Interval:  396 QTC Calculation: 436 R Axis:   -65 Text Interpretation: Normal sinus rhythm Left axis deviation Left bundle branch block No significant change since last tracing Confirmed by Lajean Saver [id number]) on 10/17/2019 5:02:08 PM   Radiology DG Chest 2 View  Result Date: 10/17/2019 CLINICAL DATA:  Onset chest pain at 6 a.m. this morning. EXAM: CHEST - 2 VIEW COMPARISON:  PA and lateral chest and CT chest 08/28/2016. FINDINGS: The lungs are clear. Heart size is normal. No pneumothorax or pleural fluid. No acute or focal bony abnormality. IMPRESSION: Negative chest. Electronically Signed   By: Inge Rise M.D.   On: 10/17/2019 16:00   MR BREAST BILATERAL WO CONTRAST  Result Date: 10/16/2019 CLINICAL DATA:  74 year old female presenting for MRI to evaluate silicone implant integrity. EXAM: BILATERAL BREAST MRI  WITHOUT CONTRAST  TECHNIQUE: Multiplanar, multisequence MR images of both breasts without contrast. The exam was used to evaluate implant integrity. Because of the sequences used and the lack of intravenous contrast, this study is not diagnostic for breast lesions. Three-dimensional MR images were rendered by post-processing of the original MR data on an independent workstation. The three-dimensional MR images were interpreted, and findings are reported in the following complete MRI report for this study. Three dimensional images were evaluated at the independent Dyna[employer]ad workstation COMPARISON:  No prior MRI available for comparison. Correlation made with patient's prior mammograms. FINDINGS: Breast composition: b. Scattered fibroglandular tissue. The patient has bilateral retropectoral silicone implants. Right breast: Linguine sign noted within the implant consistent with intracapsular rupture. There are multiple pockets of extracapsular rupture as well, predominantly in the superior to upper outer and to a lesser extent in the medial aspect of the implant. Left breast: Linguine sign is noted within the implant consistent with intracapsular rupture. There are multiple pockets of extracapsular silicone, most predominant in the superior aspect and medial aspect of the implant. There is a T2 bright 1 cm oval mass in the lateral aspect of the left breast, middle depth (series 4, image 25 measuring). This corresponds with a mass seen on the patient's screening mammograms, and has been stable since at least 2016, and is therefore benign. Ancillary findings: None. 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 RECOMMENDATION: 1.  Consultation with plastic surgery for bilateral implant rupture. 2.  Screening mammography is recommended in December of 2021. BI-RADS 2: Benign. Electronically Signed   By: Ammie Ferrier M.D.   On: 10/16/2019 13:01    Procedures Procedures (including critical care  time)  Medications Ordered in ED Medications  sodium chloride flush (NS) 0.9 % injection 3 mL (has no administration in time range)    ED Course  I have reviewed the triage vital signs and the nursing notes.  Pertinent labs & imaging results that were available during my care of the patient were reviewed by me and considered in my medical decision making (  see chart for details).  Amanda Lambert is a 74y/o female with PMH of HTN, GERD, IBS, and recently diagonsed with angia that presented with chest pain radiating to both her arms described as pressure that caused heaviness with associated numbness and tingling of both arms. She was seen and evaluated in the ED for MI and her work up returned with an unchanged EKG, negative Troponin x 2, normal chest x-ray, and normal CBC and CMP. Other etiologies for chest pain considered were PNA but unlikely given normal CXR with normal WBC and no fever with no infectious symptoms. Aortic dissection was considered however she has no back pain and vital signs all remained normal with no mediastinum widening on CXR. Wells score for PE/DVT was 0 so no d-dimer was obtained. She had no shortness of breath, no difficulty breathing, normal vitals, and resolution of her chest pain in the ED.  A call was made to her Cardiologist Dr. Einar Gip to see if he would like her to stay overnight and possibly move forward with continued planned workup for angina. Dr. Einar Gip felt if both Troponin's were negative she was safe to be discharged home with instruction for no exertional activity. He will continue his work up in his office on Monday. I agree with his plan and patient was agreeable with this plan as well.   MDM Rules/Calculators/A&P                     Kaelan Brandli Garrette was evaluated in Emergency Department on 10/17/2019 for the symptoms described in the history of present illness. She was evaluated in the context of the global COVID-19 pandemic, which necessitated consideration that the  patient might be at risk for infection with the SARS-CoV-2 virus that causes COVID-19. Institutional protocols and algorithms that pertain to the evaluation of patients at risk for COVID-19 are in a state of rapid change based on information released by regulatory bodies including the CDC and federal and state organizations. These policies and algorithms were followed during the patient's care in the ED.  Final Clinical Impression(s) / ED Diagnoses Final diagnoses:  Chest pain due to myocardial ischemia, unspecified ischemic chest pain type    Rx / DC Orders ED Discharge Orders    None       Nuala Alpha, DO 10/17/19 1926    Drenda Freeze, MD 10/17/19 [phone]

## 2019-10-19 ENCOUNTER — Other Ambulatory Visit: Payer: Self-pay | Admitting: Cardiology

## 2019-10-19 DIAGNOSIS — I6523 Occlusion and stenosis of bilateral carotid arteries: Secondary | ICD-10-CM

## 2019-10-20 ENCOUNTER — Ambulatory Visit (INDEPENDENT_AMBULATORY_CARE_PROVIDER_SITE_OTHER): Payer: Medicare Other

## 2019-10-20 ENCOUNTER — Other Ambulatory Visit: Payer: Self-pay

## 2019-10-20 DIAGNOSIS — R0602 Shortness of breath: Secondary | ICD-10-CM | POA: Diagnosis not present

## 2019-10-20 DIAGNOSIS — I447 Left bundle-branch block, unspecified: Secondary | ICD-10-CM

## 2019-10-20 DIAGNOSIS — I209 Angina pectoris, unspecified: Secondary | ICD-10-CM | POA: Diagnosis not present

## 2019-10-20 DIAGNOSIS — R0789 Other chest pain: Secondary | ICD-10-CM

## 2019-10-21 NOTE — Progress Notes (Signed)
Low risk, abnormal perfusion probably due to LBBB.

## 2019-10-21 NOTE — Progress Notes (Signed)
Low normal LVEF and moderate MR.  And TR

## 2019-10-21 NOTE — Progress Notes (Signed)
Bilateral asymptomatic stenosis will recheck in 6 months

## 2019-10-22 ENCOUNTER — Encounter: Payer: Self-pay | Admitting: Cardiology

## 2019-10-22 ENCOUNTER — Ambulatory Visit (INDEPENDENT_AMBULATORY_CARE_PROVIDER_SITE_OTHER): Payer: Medicare Other | Admitting: Cardiology

## 2019-10-22 ENCOUNTER — Other Ambulatory Visit: Payer: Self-pay

## 2019-10-22 VITALS — BP 130/65 | HR 85 | Temp 97.6°F | Ht 67.0 in | Wt 201.5 lb

## 2019-10-22 DIAGNOSIS — I6523 Occlusion and stenosis of bilateral carotid arteries: Secondary | ICD-10-CM

## 2019-10-22 DIAGNOSIS — I447 Left bundle-branch block, unspecified: Secondary | ICD-10-CM

## 2019-10-22 DIAGNOSIS — R0989 Other specified symptoms and signs involving the circulatory and respiratory systems: Secondary | ICD-10-CM

## 2019-10-22 DIAGNOSIS — R0609 Other forms of dyspnea: Secondary | ICD-10-CM | POA: Diagnosis not present

## 2019-10-22 DIAGNOSIS — I209 Angina pectoris, unspecified: Secondary | ICD-10-CM | POA: Diagnosis not present

## 2019-10-22 DIAGNOSIS — R0600 Dyspnea, unspecified: Secondary | ICD-10-CM

## 2019-10-22 MED ORDER — ROSUVASTATIN CALCIUM 10 MG PO TABS: 10.0000 mg | ORAL_TABLET | Freq: Every day | ORAL | 2 refills | Status: DC

## 2019-10-22 NOTE — H&P (View-Only) (Signed)
Primary Physician/Referring:  [name], MD  Patient ID: [name], female    DOB: Jan 29, 1946, 74 y.o.   MRN: [id number]  Chief Complaint  Patient presents with  . Chest Pain  . Loss of Consciousness  . Follow-up    ER visit , NUC results   HPI:    CYNDIE DEBOLT  is a 74 y.o. Caucasian female patient with long-standing history of LBBB, hypertension, no known hyperlipidemia or diabetes mellitus presents for evaluation of angina pectoris.  I had started her on amlodipine and also sublingual nitroglycerin along with aspirin on a prior office visit 2 weeks ago.  She underwent stress testing and echocardiogram and carotid duplex and presents for follow-up, this was a significant due to persistence of symptoms of chest tightness that started about a month ago.  About 4 days ago she had an episode of chest tightness for which she had to take 3 sublingual nitroglycerin.  While she was resting for about 3 to 4 hours for the chest tightness and arm tightness to go away, she started feeling dizzy and thought that she was in a pass out and had near syncopal spell.  She was also evaluated in the emergency room and discharged home with negative cardiac markers and no change in EKG which revealed bundle branch block.  She felt well over the weekend, but again on Monday she had another episode of chest discomfort for which she took 2 sublingual nitroglycerin.  About an hour later she did feel dizzy again and felt near syncopal.  No palpitations.  No leg edema.  No dyspnea.  She did have mild diaphoresis both episodes.   Past Medical History:  Diagnosis Date  . Arthritis    right elbow  . Biliary dyskinesia   . Constipation   . GERD (gastroesophageal reflux disease)   . Hepatitis B 1975  . Hypertension   . Nausea   . PONV (postoperative nausea and vomiting)    slow to wake up  . [place] spotted fever 04/01/2012   adm. to hospital  . Syncope 10/17/2019  . Weakness    Past Surgical  History:  Procedure Laterality Date  . APPENDECTOMY  1963  . CHOLECYSTECTOMY  04/26/2012   Procedure: LAPAROSCOPIC CHOLECYSTECTOMY WITH INTRAOPERATIVE CHOLANGIOGRAM;  Surgeon: Merrie Roof, MD;  Location: [facility];  Service: General;  Laterality: N/A;  . DILATION AND CURETTAGE OF UTERUS  1982   for miscarriage  . DILATION AND CURETTAGE, DIAGNOSTIC / THERAPEUTIC  1972  . TONSILLECTOMY  1964  - approximate  . WRIST FRACTURE SURGERY  2008   right   Social History   Tobacco Use  . Smoking status: Never Smoker  . Smokeless tobacco: Never Used  Substance Use Topics  . Alcohol use: No    ROS  Review of Systems  Constitution: Negative for weight gain.  Cardiovascular: Positive for chest pain and dyspnea on exertion. Negative for claudication, leg swelling and syncope.  Respiratory: Negative for hemoptysis.   Endocrine: Negative for cold intolerance.  Hematologic/Lymphatic: Does not bruise/bleed easily.  Musculoskeletal: Positive for joint pain (left hip).  Gastrointestinal: Negative for hematochezia and melena.  Neurological: Negative for headaches and light-headedness.   Objective  Blood pressure 130/65, pulse 85, temperature 97.6 F (36.4 C), height 5\' 7"  (1.702 m), weight 201 lb 8 oz (91.4 kg), SpO2 94 %.  Vitals with BMI 10/22/2019 10/22/2019 10/22/2019  Height - - 5\' 7"   Weight - - 201 lbs 8 oz  BMI - - [id number]  Systolic [id number]  Diastolic 65 79 66  Pulse 85 75 74  Some encounter information is confidential and restricted. Go to Review Flowsheets activity to see all data.      Flowsheets     10/22/19 1:51 PM  10/22/19 2:00 PM  10/22/19 2:01 PM   BP  138/66  147/79  130/65   BP Location  LeftArm  LeftArm  LeftArm   Patient Position  Supine  Sitting  Standing   Cuff Size  Large  Large  Large   Pulse  74  75  85   Temp  97.70F(36.4C)     SpO2  94%     Weight  201lb8oz(91.4kg)     Height  5'7"(1.743m)          Physical Exam    Constitutional:  Well-built and mildly obese in no acute distress.  Neck: No thyromegaly present.  Cardiovascular: Normal rate, regular rhythm, normal heart sounds and intact distal pulses. Exam reveals no gallop.  No murmur heard. Pulses:      Carotid pulses are on the right side with bruit. No leg edema, no JVD. Prominent right carotid bruit.  Otherwise normal vascular exam.  Pulmonary/Chest: Effort normal and breath sounds normal.  Abdominal: Soft. Bowel sounds are normal.  Musculoskeletal:     Cervical back: Neck supple.  Skin: Skin is warm and dry.   Laboratory examination:   Recent Labs    12/10/18 1622 10/14/19 1311 10/17/19 1524  NA 135 141 141  K 3.7 3.9 3.5  CL 100 102 104  CO2 26 25 26   GLUCOSE 102* 93 104*  BUN 13 13 13   CREATININE 0.89 0.99 1.04*  CALCIUM 8.8* 9.7 9.6  GFRNONAA >60 57* 54*  GFRAA >60 66 >60   estimated creatinine clearance is 56.7 mL/min (A) (by C-G formula based on SCr of 1.04 mg/dL (H)).  CMP Latest Ref Rng & Units 10/17/2019 10/14/2019 12/10/2018  Glucose 70 - 99 mg/dL 104(H) 93 102(H)  BUN 8 - 23 mg/dL 13 13 13   Creatinine 0.44 - 1.00 mg/dL 1.04(H) 0.99 0.89  Sodium 135 - 145 mmol/L 141 141 135  Potassium 3.5 - 5.1 mmol/L 3.5 3.9 3.7  Chloride 98 - 111 mmol/L 104 102 100  CO2 22 - 32 mmol/L 26 25 26   Calcium 8.9 - 10.3 mg/dL 9.6 9.7 8.8(L)  Total Protein 6.0 - 8.5 g/dL - 6.6 7.0  Total Bilirubin 0.0 - 1.2 mg/dL - 0.5 0.5  Alkaline Phos 39 - 117 IU/L - 104 88  AST 0 - 40 IU/L - 15 19  ALT 0 - 32 IU/L - 11 12   CBC Latest Ref Rng & Units 10/17/2019 10/14/2019 12/10/2018  WBC 4.0 - 10.5 K/uL 6.1 6.6 6.0  Hemoglobin 12.0 - 15.0 g/dL 13.7 12.8 12.6  Hematocrit 36.0 - 46.0 % 42.3 37.3 39.4  Platelets 150 - 400 K/uL 340 329 303   Lipid Panel     Component Value Date/Time   CHOL 174 10/14/2019 1311   TRIG 73 10/14/2019 1311   HDL 88 10/14/2019 1311   LDLCALC 72 10/14/2019 1311   HEMOGLOBIN A1C No results found for: HGBA1C,  MPG TSH Recent Labs    10/14/19 1311  TSH 1.330   Medications and allergies   Allergies  Allergen Reactions  . Ace Inhibitors Anaphylaxis    Angioedema.  . Bee Venom Anaphylaxis  . Thorazine [Chlorpromazine] Anaphylaxis  . Wasp Venom Anaphylaxis  . Levaquin [  Levofloxacin In D5w] Nausea And Vomiting    Stated by patient she could not handle levaquin even with antiemetics   . Shrimp [Shellfish Allergy] Rash and Hives    "splotches"   . Compazine [Prochlorperazine Edisylate] Other (See Comments)    Becomes anxious.  . Sulfur Itching       . Latex Rash     Current Outpatient Medications  Medication Instructions  . acetaminophen (TYLENOL) 1,000 mg, Oral, Every 6 hours PRN  . amLODipine (NORVASC) 5 mg, Oral, Daily  . aspirin EC 81 mg, Oral, Daily  . Calcium Carb-Cholecalciferol (CALCIUM 600/VITAMIN D3) 600-800 MG-UNIT TABS 5 tablets, Oral, Daily  . EPINEPHrine (EPIPEN 2-PAK) 0.3 mg, Intramuscular, Once PRN  . meloxicam (MOBIC) 15 mg, Oral, Daily  . metoprolol tartrate (LOPRESSOR) 25 mg, Oral, 2 times daily  . nitroGLYCERIN (NITROSTAT) 0.4 mg, Sublingual, Every 5 min PRN  . pantoprazole (PROTONIX) 40 mg, Daily  . rosuvastatin (CRESTOR) 10 mg, Oral, Daily  . Thiamine HCl (VITAMIN B-1 PO) 1 tablet, Oral, Daily  . triamterene-hydrochlorothiazide (MAXZIDE-25) 37.5-25 MG tablet 1 tablet, Oral, Daily    Radiology:  No results found.  Cardiac Studies:   Echocardiogram 10/15/2019: Left ventricle cavity is normal in size. Moderate concentric hypertrophy of the left ventricle. Normal global wall motion. Normal LV systolic function with visual EF 50-55%. Doppler evidence of grade I (impaired) diastolic dysfunction, normal LAP. Left atrial cavity is mildly dilated. Moderate (Grade II) mitral regurgitation. Structurally normal tricuspid valve.  Mild to moderate tricuspid regurgitation. No evidence of pulmonary hypertension.  Carotid artery duplex 10/15/2019:  Stenosis in the right  internal carotid artery (50-69%). Lower end of  spectrum with heterogenous plaque.  Stenosis in the left internal carotid artery (50-69%). Lower end of  spectrum with heterogenous plaque.  Antegrade right vertebral artery flow. Antegrade left vertebral artery  flow.  Follow up in six months is appropriate if clinically indicated.  Lexiscan (Walking with mod Bruce)Tetrofosmin Stress Test  10/20/2019: Nondiagnostic ECG stress. Mild degree large extent fixed perfusion defect located in the mid anteroseptal wall, mid inferoseptal wall, basal anteroseptal wall and basal inferoseptal wall. This is consistent with LBBB, mild ischemia cannot be completely excluded.  All segments of left ventricle demonstrated normal wall motion and thickening. Stress LV EF is normal 61%.  Low risk. No previous exam available for comparison.  Assessment     ICD-10-CM   1. Angina pectoris (HCC)  I20.9 EKG 12-Lead    rosuvastatin (CRESTOR) 10 MG tablet  2. LBBB (left bundle branch block)  I44.7   3. Dyspnea on exertion  R06.00   4. Right carotid bruit  R09.89   5. Asymptomatic bilateral carotid artery stenosis  I65.23 rosuvastatin (CRESTOR) 10 MG tablet    EKG 10/22/2019: Normal sinus rhythm at rate of 71 bpm, left bundle branch block.  No further analysis.  No significant change from prior EKG.    EKG 10/13/2019: Sinus rhythm with first-degree AV block at the rate of 70 bpm, LBBB.  No further analysis.  No significant change from 08/28/2016.    Meds ordered this encounter  Medications  . rosuvastatin (CRESTOR) 10 MG tablet    Sig: Take 1 tablet (10 mg total) by mouth daily.    Dispense:  30 tablet    Refill:  2    Medications Discontinued During This Encounter  Medication Reason  . rosuvastatin (CRESTOR) 10 MG tablet Reorder    Recommendations:   SAKAYE TEVES  is a 74 y.o. Caucasian female  patient with long-standing history of LBBB, hypertension, no known hyperlipidemia or diabetes mellitus presents  for evaluation of angina pectoris.  I had started her on amlodipine and also sublingual nitroglycerin along with aspirin on a prior office visit 2 weeks ago.  She underwent stress testing and echocardiogram and carotid duplex and presents for follow-up, this was a significant due to persistence of symptoms of chest tightness.   She was evaluated in the emergency room 4 days ago after episode of chest pain suggestive of angina for which she took 3 sublingual nitroglycerin, but evaluated in the emergency room for near syncope.  Examination was unremarkable.  I reviewed extensively all the results of the test that was recently performed including carotid duplex.  She has low to intermediate risk stress test.  I recommended continue medical therapy and I also discussed regarding proceeding with coronary angiography.  Patient feels extremely concerned that [place], the last 2 episodes of near syncope with chest tightness and ongoing symptoms and frequent use of nitroglycerin in spite of being on metoprolol and also no pain makes her feel very uncomfortable and would like to proceed with cardiac catheterization.  Schedule for cardiac catheterization, and possible angioplasty. We discussed regarding risks, benefits, alternatives to this including stress testing, CTA and continued medical therapy. Patient wants to proceed. Understands <1-2% risk of death, stroke, MI, urgent CABG, bleeding, infection, renal failure but not limited to these.  I reviewed the results of the carotid artery duplex, we will continue surveillance.  I also reviewed her labs, although her lipids are normal, in view of carotid disease and also angina pectoris, she is now willing to start Crestor 10 mg daily which was previously prescribed.  She will continue with aspirin for now.  Office visit after the angiography. I reviewed her records from the ED.  Updated labs.  She is not orthostatic.   Adrian Prows, MD,  [facility] 10/22/2019, 9:28 PM [facility] [place] Office: [phone]

## 2019-10-22 NOTE — Progress Notes (Signed)
Primary Physician/Referring:  [name], MD  Patient ID: Amanda Lambert, female    DOB: 07-26-1946, 74 y.o.   MRN: [id number]  Chief Complaint  Patient presents with  . Chest Pain  . Loss of Consciousness  . Follow-up    ER visit , NUC results   HPI:    Amanda Lambert  is a 74 y.o. Caucasian female patient with long-standing history of LBBB, hypertension, no known hyperlipidemia or diabetes mellitus presents for evaluation of angina pectoris.  I had started her on amlodipine and also sublingual nitroglycerin along with aspirin on a prior office visit 2 weeks ago.  She underwent stress testing and echocardiogram and carotid duplex and presents for follow-up, this was a significant due to persistence of symptoms of chest tightness that started about a month ago.  About 4 days ago she had an episode of chest tightness for which she had to take 3 sublingual nitroglycerin.  While she was resting for about 3 to 4 hours for the chest tightness and arm tightness to go away, she started feeling dizzy and thought that she was in a pass out and had near syncopal spell.  She was also evaluated in the emergency room and discharged home with negative cardiac markers and no change in EKG which revealed bundle branch block.  She felt well over the weekend, but again on Monday she had another episode of chest discomfort for which she took 2 sublingual nitroglycerin.  About an hour later she did feel dizzy again and felt near syncopal.  No palpitations.  No leg edema.  No dyspnea.  She did have mild diaphoresis both episodes.   Past Medical History:  Diagnosis Date  . Arthritis    right elbow  . Biliary dyskinesia   . Constipation   . GERD (gastroesophageal reflux disease)   . Hepatitis B 1975  . Hypertension   . Nausea   . PONV (postoperative nausea and vomiting)    slow to wake up  . [place] fever 04/01/2012   adm. to hospital  . Syncope 10/17/2019  . Weakness    Past Surgical  History:  Procedure Laterality Date  . APPENDECTOMY  1963  . CHOLECYSTECTOMY  04/26/2012   Procedure: LAPAROSCOPIC CHOLECYSTECTOMY WITH INTRAOPERATIVE CHOLANGIOGRAM;  Surgeon: Merrie Roof, MD;  Location: [facility];  Service: General;  Laterality: N/A;  . DILATION AND CURETTAGE OF UTERUS  1982   for miscarriage  . DILATION AND CURETTAGE, DIAGNOSTIC / THERAPEUTIC  1972  . TONSILLECTOMY  1964  - approximate  . WRIST FRACTURE SURGERY  2008   right   Social History   Tobacco Use  . Smoking status: Never Smoker  . Smokeless tobacco: Never Used  Substance Use Topics  . Alcohol use: No    ROS  Review of Systems  Constitution: Negative for weight gain.  Cardiovascular: Positive for chest pain and dyspnea on exertion. Negative for claudication, leg swelling and syncope.  Respiratory: Negative for hemoptysis.   Endocrine: Negative for cold intolerance.  Hematologic/Lymphatic: Does not bruise/bleed easily.  Musculoskeletal: Positive for joint pain (left hip).  Gastrointestinal: Negative for hematochezia and melena.  Neurological: Negative for headaches and light-headedness.   Objective  Blood pressure 130/65, pulse 85, temperature 97.6 F (36.4 C), height 5\' 7"  (1.702 m), weight 201 lb 8 oz (91.4 kg), SpO2 94 %.  Vitals with BMI 10/22/2019 10/22/2019 10/22/2019  Height - - 5\' 7"   Weight - - 201 lbs 8 oz  BMI - - 99991111  Systolic [id number]  Diastolic 65 79 66  Pulse 85 75 74  Some encounter information is confidential and restricted. Go to Review Flowsheets activity to see all data.      Flowsheets     10/22/19 1:51 PM  10/22/19 2:00 PM  10/22/19 2:01 PM   BP  138/66  147/79  130/65   BP Location  LeftArm  LeftArm  LeftArm   Patient Position  Supine  Sitting  Standing   Cuff Size  Large  Large  Large   Pulse  74  75  85   Temp  97.35F(36.4C)     SpO2  94%     Weight  201lb8oz(91.4kg)     Height  5'7"(1.751m)          Physical Exam    Constitutional:  Well-built and mildly obese in no acute distress.  Neck: No thyromegaly present.  Cardiovascular: Normal rate, regular rhythm, normal heart sounds and intact distal pulses. Exam reveals no gallop.  No murmur heard. Pulses:      Carotid pulses are on the right side with bruit. No leg edema, no JVD. Prominent right carotid bruit.  Otherwise normal vascular exam.  Pulmonary/Chest: Effort normal and breath sounds normal.  Abdominal: Soft. Bowel sounds are normal.  Musculoskeletal:     Cervical back: Neck supple.  Skin: Skin is warm and dry.   Laboratory examination:   Recent Labs    12/10/18 1622 10/14/19 1311 10/17/19 1524  NA 135 141 141  K 3.7 3.9 3.5  CL 100 102 104  CO2 26 25 26   GLUCOSE 102* 93 104*  BUN 13 13 13   CREATININE 0.89 0.99 1.04*  CALCIUM 8.8* 9.7 9.6  GFRNONAA >60 57* 54*  GFRAA >60 66 >60   estimated creatinine clearance is 56.7 mL/min (A) (by C-G formula based on SCr of 1.04 mg/dL (H)).  CMP Latest Ref Rng & Units 10/17/2019 10/14/2019 12/10/2018  Glucose 70 - 99 mg/dL 104(H) 93 102(H)  BUN 8 - 23 mg/dL 13 13 13   Creatinine 0.44 - 1.00 mg/dL 1.04(H) 0.99 0.89  Sodium 135 - 145 mmol/L 141 141 135  Potassium 3.5 - 5.1 mmol/L 3.5 3.9 3.7  Chloride 98 - 111 mmol/L 104 102 100  CO2 22 - 32 mmol/L 26 25 26   Calcium 8.9 - 10.3 mg/dL 9.6 9.7 8.8(L)  Total Protein 6.0 - 8.5 g/dL - 6.6 7.0  Total Bilirubin 0.0 - 1.2 mg/dL - 0.5 0.5  Alkaline Phos 39 - 117 IU/L - 104 88  AST 0 - 40 IU/L - 15 19  ALT 0 - 32 IU/L - 11 12   CBC Latest Ref Rng & Units 10/17/2019 10/14/2019 12/10/2018  WBC 4.0 - 10.5 K/uL 6.1 6.6 6.0  Hemoglobin 12.0 - 15.0 g/dL 13.7 12.8 12.6  Hematocrit 36.0 - 46.0 % 42.3 37.3 39.4  Platelets 150 - 400 K/uL 340 329 303   Lipid Panel     Component Value Date/Time   CHOL 174 10/14/2019 1311   TRIG 73 10/14/2019 1311   HDL 88 10/14/2019 1311   LDLCALC 72 10/14/2019 1311   HEMOGLOBIN A1C No results found for: HGBA1C,  MPG TSH Recent Labs    10/14/19 1311  TSH 1.330   Medications and allergies   Allergies  Allergen Reactions  . Ace Inhibitors Anaphylaxis    Angioedema.  . Bee Venom Anaphylaxis  . Thorazine [Chlorpromazine] Anaphylaxis  . Wasp Venom Anaphylaxis  . Levaquin [  Levofloxacin In D5w] Nausea And Vomiting    Stated by patient she could not handle levaquin even with antiemetics   . Shrimp [Shellfish Allergy] Rash and Hives    "splotches"   . Compazine [Prochlorperazine Edisylate] Other (See Comments)    Becomes anxious.  . Sulfur Itching       . Latex Rash     Current Outpatient Medications  Medication Instructions  . acetaminophen (TYLENOL) 1,000 mg, Oral, Every 6 hours PRN  . amLODipine (NORVASC) 5 mg, Oral, Daily  . aspirin EC 81 mg, Oral, Daily  . Calcium Carb-Cholecalciferol (CALCIUM 600/VITAMIN D3) 600-800 MG-UNIT TABS 5 tablets, Oral, Daily  . EPINEPHrine (EPIPEN 2-PAK) 0.3 mg, Intramuscular, Once PRN  . meloxicam (MOBIC) 15 mg, Oral, Daily  . metoprolol tartrate (LOPRESSOR) 25 mg, Oral, 2 times daily  . nitroGLYCERIN (NITROSTAT) 0.4 mg, Sublingual, Every 5 min PRN  . pantoprazole (PROTONIX) 40 mg, Daily  . rosuvastatin (CRESTOR) 10 mg, Oral, Daily  . Thiamine HCl (VITAMIN B-1 PO) 1 tablet, Oral, Daily  . triamterene-hydrochlorothiazide (MAXZIDE-25) 37.5-25 MG tablet 1 tablet, Oral, Daily    Radiology:  No results found.  Cardiac Studies:   Echocardiogram 10/15/2019: Left ventricle cavity is normal in size. Moderate concentric hypertrophy of the left ventricle. Normal global wall motion. Normal LV systolic function with visual EF 50-55%. Doppler evidence of grade I (impaired) diastolic dysfunction, normal LAP. Left atrial cavity is mildly dilated. Moderate (Grade II) mitral regurgitation. Structurally normal tricuspid valve.  Mild to moderate tricuspid regurgitation. No evidence of pulmonary hypertension.  Carotid artery duplex 10/15/2019:  Stenosis in the right  internal carotid artery (50-69%). Lower end of  spectrum with heterogenous plaque.  Stenosis in the left internal carotid artery (50-69%). Lower end of  spectrum with heterogenous plaque.  Antegrade right vertebral artery flow. Antegrade left vertebral artery  flow.  Follow up in six months is appropriate if clinically indicated.  Lexiscan (Walking with mod Bruce)Tetrofosmin Stress Test  10/20/2019: Nondiagnostic ECG stress. Mild degree large extent fixed perfusion defect located in the mid anteroseptal wall, mid inferoseptal wall, basal anteroseptal wall and basal inferoseptal wall. This is consistent with LBBB, mild ischemia cannot be completely excluded.  All segments of left ventricle demonstrated normal wall motion and thickening. Stress LV EF is normal 61%.  Low risk. No previous exam available for comparison.  Assessment     ICD-10-CM   1. Angina pectoris (HCC)  I20.9 EKG 12-Lead    rosuvastatin (CRESTOR) 10 MG tablet  2. LBBB (left bundle branch block)  I44.7   3. Dyspnea on exertion  R06.00   4. Right carotid bruit  R09.89   5. Asymptomatic bilateral carotid artery stenosis  I65.23 rosuvastatin (CRESTOR) 10 MG tablet    EKG 10/22/2019: Normal sinus rhythm at rate of 71 bpm, left bundle branch block.  No further analysis.  No significant change from prior EKG.    EKG 10/13/2019: Sinus rhythm with first-degree AV block at the rate of 70 bpm, LBBB.  No further analysis.  No significant change from 08/28/2016.    Meds ordered this encounter  Medications  . rosuvastatin (CRESTOR) 10 MG tablet    Sig: Take 1 tablet (10 mg total) by mouth daily.    Dispense:  30 tablet    Refill:  2    Medications Discontinued During This Encounter  Medication Reason  . rosuvastatin (CRESTOR) 10 MG tablet Reorder    Recommendations:   Amanda Lambert  is a 74 y.o. Caucasian female  patient with long-standing history of LBBB, hypertension, no known hyperlipidemia or diabetes mellitus presents  for evaluation of angina pectoris.  I had started her on amlodipine and also sublingual nitroglycerin along with aspirin on a prior office visit 2 weeks ago.  She underwent stress testing and echocardiogram and carotid duplex and presents for follow-up, this was a significant due to persistence of symptoms of chest tightness.   She was evaluated in the emergency room 4 days ago after episode of chest pain suggestive of angina for which she took 3 sublingual nitroglycerin, but evaluated in the emergency room for near syncope.  Examination was unremarkable.  I reviewed extensively all the results of the test that was recently performed including carotid duplex.  She has low to intermediate risk stress test.  I recommended continue medical therapy and I also discussed regarding proceeding with coronary angiography.  Patient feels extremely concerned that [place], the last 2 episodes of near syncope with chest tightness and ongoing symptoms and frequent use of nitroglycerin in spite of being on metoprolol and also no pain makes her feel very uncomfortable and would like to proceed with cardiac catheterization.  Schedule for cardiac catheterization, and possible angioplasty. We discussed regarding risks, benefits, alternatives to this including stress testing, CTA and continued medical therapy. Patient wants to proceed. Understands <1-2% risk of death, stroke, MI, urgent CABG, bleeding, infection, renal failure but not limited to these.  I reviewed the results of the carotid artery duplex, we will continue surveillance.  I also reviewed her labs, although her lipids are normal, in view of carotid disease and also angina pectoris, she is now willing to start Crestor 10 mg daily which was previously prescribed.  She will continue with aspirin for now.  Office visit after the angiography. I reviewed her records from the ED.  Updated labs.  She is not orthostatic.   Adrian Prows, MD,  [facility] 10/22/2019, 9:28 PM [facility] [place] Office: [phone]

## 2019-10-28 DIAGNOSIS — M25552 Pain in left hip: Secondary | ICD-10-CM | POA: Diagnosis not present

## 2019-10-31 ENCOUNTER — Other Ambulatory Visit (HOSPITAL_COMMUNITY)
Admission: RE | Admit: 2019-10-31 | Discharge: 2019-10-31 | Disposition: A | Discharge status: Home / Self Care | Payer: Medicare Other | Source: Ambulatory Visit | Attending: Cardiology | Admitting: Cardiology

## 2019-10-31 DIAGNOSIS — Z20822 Contact with and (suspected) exposure to covid-19: Secondary | ICD-10-CM | POA: Diagnosis not present

## 2019-10-31 DIAGNOSIS — Z01812 Encounter for preprocedural laboratory examination: Secondary | ICD-10-CM | POA: Diagnosis not present

## 2019-10-31 LAB — SARS CORONAVIRUS 2 (TAT 6-24 HRS): SARS Coronavirus 2: NEGATIVE

## 2019-11-03 DIAGNOSIS — I209 Angina pectoris, unspecified: Secondary | ICD-10-CM | POA: Diagnosis present

## 2019-11-03 DIAGNOSIS — I200 Unstable angina: Secondary | ICD-10-CM | POA: Diagnosis present

## 2019-11-04 ENCOUNTER — Ambulatory Visit (HOSPITAL_COMMUNITY)
Admission: RE | Admit: 2019-11-04 | Discharge: 2019-11-04 | Disposition: A | Discharge status: Home / Self Care | Payer: Medicare Other | Attending: Cardiology | Admitting: Cardiology

## 2019-11-04 ENCOUNTER — Other Ambulatory Visit: Payer: Self-pay

## 2019-11-04 ENCOUNTER — Encounter (HOSPITAL_COMMUNITY)
Admission: RE | Disposition: A | Discharge status: Home / Self Care | Payer: Self-pay | Source: Home / Self Care | Attending: Cardiology

## 2019-11-04 DIAGNOSIS — I25118 Atherosclerotic heart disease of native coronary artery with other forms of angina pectoris: Secondary | ICD-10-CM

## 2019-11-04 DIAGNOSIS — I447 Left bundle-branch block, unspecified: Secondary | ICD-10-CM | POA: Diagnosis not present

## 2019-11-04 DIAGNOSIS — I25119 Atherosclerotic heart disease of native coronary artery with unspecified angina pectoris: Secondary | ICD-10-CM | POA: Diagnosis not present

## 2019-11-04 DIAGNOSIS — I200 Unstable angina: Secondary | ICD-10-CM | POA: Diagnosis present

## 2019-11-04 DIAGNOSIS — Z79899 Other long term (current) drug therapy: Secondary | ICD-10-CM | POA: Insufficient documentation

## 2019-11-04 DIAGNOSIS — I10 Essential (primary) hypertension: Secondary | ICD-10-CM | POA: Insufficient documentation

## 2019-11-04 DIAGNOSIS — I6523 Occlusion and stenosis of bilateral carotid arteries: Secondary | ICD-10-CM | POA: Insufficient documentation

## 2019-11-04 DIAGNOSIS — R0600 Dyspnea, unspecified: Secondary | ICD-10-CM | POA: Insufficient documentation

## 2019-11-04 DIAGNOSIS — K219 Gastro-esophageal reflux disease without esophagitis: Secondary | ICD-10-CM | POA: Insufficient documentation

## 2019-11-04 DIAGNOSIS — Z7982 Long term (current) use of aspirin: Secondary | ICD-10-CM | POA: Diagnosis not present

## 2019-11-04 DIAGNOSIS — I209 Angina pectoris, unspecified: Secondary | ICD-10-CM | POA: Diagnosis present

## 2019-11-04 HISTORY — PX: LEFT HEART CATH AND CORONARY ANGIOGRAPHY: CATH118249

## 2019-11-04 SURGERY — LEFT HEART CATH AND CORONARY ANGIOGRAPHY
Anesthesia: LOCAL

## 2019-11-04 MED ORDER — MIDAZOLAM HCL 2 MG/2ML IJ SOLN
INTRAMUSCULAR | Status: AC
  Filled 2019-11-04: qty 2

## 2019-11-04 MED ORDER — ONDANSETRON HCL 4 MG/2ML IJ SOLN: 4.0000 mg | Freq: Four times a day (QID) | INTRAMUSCULAR | Status: DC | PRN

## 2019-11-04 MED ORDER — SODIUM CHLORIDE 0.9% FLUSH: 3.0000 mL | Freq: Two times a day (BID) | INTRAVENOUS | Status: DC

## 2019-11-04 MED ORDER — HEPARIN (PORCINE) IN NACL 1000-0.9 UT/500ML-% IV SOLN
INTRAVENOUS | Status: DC | PRN
  Administered 2019-11-04: 500 mL

## 2019-11-04 MED ORDER — FENTANYL CITRATE (PF) 100 MCG/2ML IJ SOLN
INTRAMUSCULAR | Status: AC
  Filled 2019-11-04: qty 2

## 2019-11-04 MED ORDER — FENTANYL CITRATE (PF) 100 MCG/2ML IJ SOLN
INTRAMUSCULAR | Status: DC | PRN
  Administered 2019-11-04: 50 ug via INTRAVENOUS

## 2019-11-04 MED ORDER — ASPIRIN 81 MG PO CHEW: 81.0000 mg | CHEWABLE_TABLET | ORAL | Status: DC

## 2019-11-04 MED ORDER — HYDRALAZINE HCL 20 MG/ML IJ SOLN: 10.0000 mg | INTRAMUSCULAR | Status: DC | PRN

## 2019-11-04 MED ORDER — LIDOCAINE HCL (PF) 1 % IJ SOLN
INTRAMUSCULAR | Status: DC | PRN
  Administered 2019-11-04: 2 mL

## 2019-11-04 MED ORDER — SODIUM CHLORIDE 0.9 % WEIGHT BASED INFUSION: 1.0000 mL/kg/h | INTRAVENOUS | Status: DC

## 2019-11-04 MED ORDER — VERAPAMIL HCL 2.5 MG/ML IV SOLN
INTRAVENOUS | Status: AC
  Filled 2019-11-04: qty 2

## 2019-11-04 MED ORDER — HEPARIN (PORCINE) IN NACL 1000-0.9 UT/500ML-% IV SOLN
INTRAVENOUS | Status: AC
  Filled 2019-11-04: qty 1000

## 2019-11-04 MED ORDER — SODIUM CHLORIDE 0.9% FLUSH: 3.0000 mL | INTRAVENOUS | Status: DC | PRN

## 2019-11-04 MED ORDER — IOHEXOL 350 MG/ML SOLN
INTRAVENOUS | Status: DC | PRN
  Administered 2019-11-04: 50 mL

## 2019-11-04 MED ORDER — ACETAMINOPHEN 325 MG PO TABS: 650.0000 mg | ORAL_TABLET | ORAL | Status: DC | PRN

## 2019-11-04 MED ORDER — SODIUM CHLORIDE 0.9 % IV SOLN: 250.0000 mL | INTRAVENOUS | Status: DC | PRN

## 2019-11-04 MED ORDER — HEPARIN SODIUM (PORCINE) 1000 UNIT/ML IJ SOLN
INTRAMUSCULAR | Status: AC
  Filled 2019-11-04: qty 1

## 2019-11-04 MED ORDER — SODIUM CHLORIDE 0.9 % WEIGHT BASED INFUSION
3.0000 mL/kg/h | INTRAVENOUS | Status: DC
  Administered 2019-11-04: 11:00:00 3 mL/kg/h via INTRAVENOUS

## 2019-11-04 MED ORDER — MIDAZOLAM HCL 2 MG/2ML IJ SOLN
INTRAMUSCULAR | Status: DC | PRN
  Administered 2019-11-04: 2 mg via INTRAVENOUS

## 2019-11-04 MED ORDER — LIDOCAINE HCL (PF) 1 % IJ SOLN
INTRAMUSCULAR | Status: AC
  Filled 2019-11-04: qty 30

## 2019-11-04 MED ORDER — HEPARIN SODIUM (PORCINE) 1000 UNIT/ML IJ SOLN
INTRAMUSCULAR | Status: DC | PRN
  Administered 2019-11-04: 4000 [IU] via INTRAVENOUS

## 2019-11-04 MED ORDER — VERAPAMIL HCL 2.5 MG/ML IV SOLN
INTRAVENOUS | Status: DC | PRN
  Administered 2019-11-04: 4 mL via INTRA_ARTERIAL

## 2019-11-04 SURGICAL SUPPLY — 10 items
CATH OPTITORQUE TIG 4.0 5F (CATHETERS) ×1 IMPLANT
DEVICE RAD COMP TR BAND LRG (VASCULAR PRODUCTS) ×1 IMPLANT
GLIDESHEATH SLEND A-KIT 6F 22G (SHEATH) ×1 IMPLANT
GUIDEWIRE ANGLED .035X150CM (WIRE) ×1 IMPLANT
GUIDEWIRE INQWIRE 1.5J.035X260 (WIRE) IMPLANT
INQWIRE 1.5J .035X260CM (WIRE) ×2
KIT HEART LEFT (KITS) ×2 IMPLANT
PACK CARDIAC CATHETERIZATION (CUSTOM PROCEDURE TRAY) ×2 IMPLANT
TRANSDUCER W/STOPCOCK (MISCELLANEOUS) ×2 IMPLANT
TUBING CIL FLEX 10 FLL-RA (TUBING) ×2 IMPLANT

## 2019-11-04 NOTE — Discharge Instructions (Signed)
Radial Site Care  This sheet gives you information about how to care for yourself after your procedure. Your health care provider may also give you more specific instructions. If you have problems or questions, contact your health care provider. What can I expect after the procedure? After the procedure, it is common to have:  Bruising and tenderness at the catheter insertion area. Follow these instructions at home: Medicines  Take over-the-counter and prescription medicines only as told by your health care provider. Insertion site care  Follow instructions from your health care provider about how to take care of your insertion site. Make sure you: ? Wash your hands with soap and water before you change your bandage (dressing). If soap and water are not available, use hand sanitizer. ? Change your dressing as told by your health care provider. ? Leave stitches (sutures), skin glue, or adhesive strips in place. These skin closures may need to stay in place for 2 weeks or longer. If adhesive strip edges start to loosen and curl up, you may trim the loose edges. Do not remove adhesive strips completely unless your health care provider tells you to do that.  Check your insertion site every day for signs of infection. Check for: ? Redness, swelling, or pain. ? Fluid or blood. ? Pus or a bad smell. ? Warmth.  Do not take baths, swim, or use a hot tub until your health care provider approves.  You may shower 24-48 hours after the procedure, or as directed by your health care provider. ? Remove the dressing and gently wash the site with plain soap and water. ? Pat the area dry with a clean towel. ? Do not rub the site. That could cause bleeding.  Do not apply powder or lotion to the site. Activity   For 24 hours after the procedure, or as directed by your health care provider: ? Do not flex or bend the affected arm. ? Do not push or pull heavy objects with the affected arm. ? Do not  drive yourself home from the hospital or clinic. You may drive 24 hours after the procedure unless your health care provider tells you not to. ? Do not operate machinery or power tools.  Do not lift anything that is heavier than 10 lb (4.5 kg), or the limit that you are told, until your health care provider says that it is safe.  Ask your health care provider when it is okay to: ? Return to work or school. ? Resume usual physical activities or sports. ? Resume sexual activity. General instructions  If the catheter site starts to bleed, raise your arm and put firm pressure on the site. If the bleeding does not stop, get help right away. This is a medical emergency.  If you went home on the same day as your procedure, a responsible adult should be with you for the first 24 hours after you arrive home.  Keep all follow-up visits as told by your health care provider. This is important. Contact a health care provider if:  You have a fever.  You have redness, swelling, or yellow drainage around your insertion site. Get help right away if:  You have unusual pain at the radial site.  The catheter insertion area swells very fast.  The insertion area is bleeding, and the bleeding does not stop when you hold steady pressure on the area.  Your arm or hand becomes pale, cool, tingly, or numb. These symptoms may represent a serious problem   that is an emergency. Do not wait to see if the symptoms will go away. Get medical help right away. Call your local emergency services (911 in the [place] Do not drive yourself to the hospital. Summary  After the procedure, it is common to have bruising and tenderness at the site.  Follow instructions from your health care provider about how to take care of your radial site wound. Check the wound every day for signs of infection.  Do not lift anything that is heavier than 10 lb (4.5 kg), or the limit that you are told, until your health care provider says  that it is safe. This information is not intended to replace advice given to you by your health care provider. Make sure you discuss any questions you have with your health care provider. Document Revised: 10/17/2017 Document Reviewed: 10/17/2017 Elsevier Patient Education  2020 [employer]  

## 2019-11-04 NOTE — Interval H&P Note (Signed)
History and Physical Interval Note:  11/04/2019 11:11 AM  Rory Percy Kotlarz  has presented today for surgery, with the diagnosis of chest pain.  The various methods of treatment have been discussed with the patient and family. After consideration of risks, benefits and other options for treatment, the patient has consented to  Procedure(s): LEFT HEART CATH AND CORONARY ANGIOGRAPHY (N/A) as a surgical intervention.  The patient's history has been reviewed, patient examined, no change in status, stable for surgery.  I have reviewed the patient's chart and labs.  Questions were answered to the patient's satisfaction.   Symptom Status: Ischemic Symptoms Non-invasive Testing: Intermediate Risk If no or indeterminate stress test, FFR/iFR results in all diseased vessels: N/A Diabetes Mellitus: No S/P CABG: No Antianginal therapy (number of long-acting drugs): >=2 Patient undergoing renal transplant: No Patient undergoing percutaneous valve procedure: No   1 Vessel Disease No proximal LAD involvement, No proximal left dominant LCX involvement  PCI: A (8);  Indication 2  CABG: M (6);  Indication 2 Proximal left dominant LCX involvement  PCI: A (8);  Indication 5  CABG: A (8);  Indication 5 Proximal LAD involvement  PCI: A (8);  Indication 5  CABG: A (8);  Indication 5  2 Vessel Disease No proximal LAD involvement  PCI: A (8);  Indication 8  CABG: A (7);  Indication 8 Proximal LAD involvement  PCI: A (8);  Indication 11  CABG: A (8);  Indication 11  3 Vessel Disease Low disease complexity (e.g., focal stenoses, SYNTAX <=22)  PCI: A (8);  Indication 17  CABG: A (8);  Indication 17 Intermediate or high disease complexity (e.g., SYNTAX >=23)  PCI: M (6);  Indication 21  CABG: A (9);  Indication 21  Left Main Disease Isolated LMCA disease: ostial or midshaft  PCI: A (7);  Indication 24  CABG: A (9);  Indication 24 Isolated LMCA disease: bifurcation involvement  PCI: M (6);  Indication  25  CABG: A (9);  Indication 25 LMCA ostial or midshaft, concurrent low disease burden multivessel disease (e.g., 1-2 additional focal stenoses, SYNTAX <=22)  PCI: A (7);  Indication 26  CABG: A (9);  Indication 26 LMCA ostial or midshaft, concurrent intermediate or high disease burden multivessel disease (e.g., 1-2 additional bifurcation stenoses, long stenoses, SYNTAX >=23)  PCI: M (4);  Indication 27  CABG: A (9);  Indication 27 LMCA bifurcation involvement, concurrent low disease burden multivessel disease (e.g., 1-2 additional focal stenoses, SYNTAX <=22)  PCI: M (6);  Indication 28  CABG: A (9);  Indication 28 LMCA bifurcation involvement, concurrent intermediate or high disease burden multivessel disease (e.g., 1-2 additional bifurcation stenoses, long stenoses, SYNTAX >=23)  PCI: R (3);  Indication 29  CABG: A (9);  Indication 29  Notes:  A indicates appropriate. M indicates may be appropriate. R indicates rarely appropriate. Number in parentheses is median score for that indication. Reclassify indicates number of functionally diseased vessels should be decreased given negative FFR/iFR. Re-evaluate the scenario interpreting any FFR/iFR negative vessel as being not significantly stenosed.  Disease means involved vessel provides flow to a sufficient amount of myocardium to be clinically important.  If FFR testing indicates a vessel is not significant, that vessel should not be considered diseased (and the patient should be reclassified with respect to extent of functionally significant disease).  Proximal LAD + proximal left dominant LCX is considered 3 vessel CAD  2 Vessel CAD with FFR/iFR abnormal in only 1 but not both is considered 1  vessel CAD  Disease complexity includes occlusion, bifurcation, trifurcation, ostial, >20 mm, tortuosity, calcification, thrombus  LMCA disease is >=50% by angiography, MLD <2.8 mm, MLA <6 mm2; MLA 6-7.5 mm2 requires further physiologic  See Table B for  risk stratification based on noninvasive testing  Journal of the [employer] of Cardiology Mar 2017, 23391; DOI: 10.1016/j.jacc.2017.02.001 [employer].de.2017.02.001.full-text.pdf This App  2018 by the Society for Cardiovascular Angiography and Interventions   Adrian Prows

## 2019-11-06 ENCOUNTER — Telehealth: Payer: Self-pay | Admitting: Cardiology

## 2019-11-10 DIAGNOSIS — Z8719 Personal history of other diseases of the digestive system: Secondary | ICD-10-CM | POA: Diagnosis not present

## 2019-11-10 DIAGNOSIS — E559 Vitamin D deficiency, unspecified: Secondary | ICD-10-CM | POA: Diagnosis not present

## 2019-11-10 DIAGNOSIS — I10 Essential (primary) hypertension: Secondary | ICD-10-CM | POA: Diagnosis not present

## 2019-11-10 DIAGNOSIS — K219 Gastro-esophageal reflux disease without esophagitis: Secondary | ICD-10-CM | POA: Diagnosis not present

## 2019-11-10 DIAGNOSIS — I25119 Atherosclerotic heart disease of native coronary artery with unspecified angina pectoris: Secondary | ICD-10-CM | POA: Diagnosis not present

## 2019-11-10 DIAGNOSIS — K5900 Constipation, unspecified: Secondary | ICD-10-CM | POA: Diagnosis not present

## 2019-11-10 DIAGNOSIS — I779 Disorder of arteries and arterioles, unspecified: Secondary | ICD-10-CM | POA: Diagnosis not present

## 2019-11-10 DIAGNOSIS — K580 Irritable bowel syndrome with diarrhea: Secondary | ICD-10-CM | POA: Diagnosis not present

## 2019-11-10 DIAGNOSIS — G2581 Restless legs syndrome: Secondary | ICD-10-CM | POA: Diagnosis not present

## 2019-11-10 DIAGNOSIS — Z0001 Encounter for general adult medical examination with abnormal findings: Secondary | ICD-10-CM | POA: Diagnosis not present

## 2019-11-10 DIAGNOSIS — T63441A Toxic effect of venom of bees, accidental (unintentional), initial encounter: Secondary | ICD-10-CM | POA: Diagnosis not present

## 2019-11-10 DIAGNOSIS — Z1389 Encounter for screening for other disorder: Secondary | ICD-10-CM | POA: Diagnosis not present

## 2019-11-11 ENCOUNTER — Ambulatory Visit: Payer: BLUE CROSS/BLUE SHIELD | Admitting: Cardiology

## 2019-11-17 ENCOUNTER — Encounter: Payer: Self-pay | Admitting: Cardiology

## 2019-11-17 ENCOUNTER — Ambulatory Visit: Payer: Medicare Other | Admitting: Cardiology

## 2019-11-17 ENCOUNTER — Other Ambulatory Visit: Payer: Self-pay

## 2019-11-17 VITALS — BP 125/65 | HR 66 | Temp 96.0°F | Resp 16 | Ht 67.0 in | Wt 205.7 lb

## 2019-11-17 DIAGNOSIS — I10 Essential (primary) hypertension: Secondary | ICD-10-CM

## 2019-11-17 DIAGNOSIS — I25118 Atherosclerotic heart disease of native coronary artery with other forms of angina pectoris: Secondary | ICD-10-CM | POA: Diagnosis not present

## 2019-11-17 DIAGNOSIS — I209 Angina pectoris, unspecified: Secondary | ICD-10-CM

## 2019-11-17 DIAGNOSIS — I6523 Occlusion and stenosis of bilateral carotid arteries: Secondary | ICD-10-CM | POA: Diagnosis not present

## 2019-11-17 NOTE — Progress Notes (Signed)
Primary Physician/Referring:  Amanda Huddle, MD  Patient ID: Amanda Lambert, female    DOB: 08-24-1946, 74 y.o.   MRN: [id number]  Chief Complaint  Patient presents with  . Chest Pain    4 week follow up   HPI:    Amanda Lambert  is a 74 y.o. Caucasian female patient with long-standing history of LBBB, hypertension, asymptomatic bilateral carotid stenosis presents for follow-up of angina pectoris.  She underwent cardiac catheterization on 11/04/2019, presents here for follow-up.  She has not had any complications from the procedure.  States that right radial site is healed well.  No other specific complaints today, still has exertional chest pain but is easily relieved with rest.  Past Medical History:  Diagnosis Date  . Arthritis    right elbow  . Biliary dyskinesia   . Constipation   . GERD (gastroesophageal reflux disease)   . Hepatitis B 1975  . Hypertension   . Nausea   . PONV (postoperative nausea and vomiting)    slow to wake up  . [place] spotted fever 04/01/2012   adm. to hospital  . Syncope 10/17/2019  . Weakness    Past Surgical History:  Procedure Laterality Date  . APPENDECTOMY  1963  . CHOLECYSTECTOMY  04/26/2012   Procedure: LAPAROSCOPIC CHOLECYSTECTOMY WITH INTRAOPERATIVE CHOLANGIOGRAM;  Surgeon: Merrie Roof, MD;  Location: [facility];  Service: General;  Laterality: N/A;  . DILATION AND CURETTAGE OF UTERUS  1982   for miscarriage  . DILATION AND CURETTAGE, DIAGNOSTIC / THERAPEUTIC  1972  . LEFT HEART CATH AND CORONARY ANGIOGRAPHY N/A 11/04/2019   Procedure: LEFT HEART CATH AND CORONARY ANGIOGRAPHY;  Surgeon: Adrian Prows, MD;  Location: [facility];  Service: Cardiovascular;  Laterality: N/A;  . TONSILLECTOMY  1964  - approximate  . WRIST FRACTURE SURGERY  2008   right   Social History   Tobacco Use  . Smoking status: Never Smoker  . Smokeless tobacco: Never Used  Substance Use Topics  . Alcohol use: No    ROS  Review of Systems    Cardiovascular: Positive for chest pain and dyspnea on exertion. Negative for leg swelling.  Musculoskeletal: Positive for joint pain.  Gastrointestinal: Negative for melena.   Objective  Blood pressure 125/65, pulse 66, temperature (!) 96 F (35.6 C), resp. rate 16, height 5\' 7"  (1.702 m), weight 205 lb 11.2 oz (93.3 kg), SpO2 96 %.  Vitals with BMI 11/17/2019 11/04/2019 11/04/2019  Height 5\' 7"  - -  Weight 205 lbs 11 oz - -  BMI 0000000 - -  Systolic 0000000 [id number]  Diastolic 65 59 50  Pulse 66 - -  Some encounter information is confidential and restricted. Go to Review Flowsheets activity to see all data.      Flowsheets     10/22/19 1:51 PM  10/22/19 2:00 PM  10/22/19 2:01 PM   BP  138/66  147/79  130/65   BP Location  LeftArm  LeftArm  LeftArm   Patient Position  Supine  Sitting  Standing   Cuff Size  Large  Large  Large   Pulse  74  75  85   Temp  97.94F(36.4C)     SpO2  94%     Weight  201lb8oz(91.4kg)     Height  5'7"(1.768m)          Physical Exam  Constitutional:  Well-built and mildly obese in no acute distress.  Cardiovascular: Normal rate, regular rhythm,  normal heart sounds and intact distal pulses. Exam reveals no gallop.  No murmur heard. Pulses:      Carotid pulses are on the right side with bruit. No leg edema, no JVD. Prominent right carotid bruit.  Otherwise normal vascular exam.  Pulmonary/Chest: Effort normal and breath sounds normal.  Abdominal: Soft. Bowel sounds are normal.   Laboratory examination:   Recent Labs    12/10/18 1622 10/14/19 1311 10/17/19 1524  NA 135 141 141  K 3.7 3.9 3.5  CL 100 102 104  CO2 26 25 26   GLUCOSE 102* 93 104*  BUN 13 13 13   CREATININE 0.89 0.99 1.04*  CALCIUM 8.8* 9.7 9.6  GFRNONAA >60 57* 54*  GFRAA >60 66 >60   CrCl cannot be calculated (Patient's most recent lab result is older than the maximum 21 days allowed.).  CMP Latest Ref Rng & Units 10/17/2019 10/14/2019 12/10/2018  Glucose  70 - 99 mg/dL 104(H) 93 102(H)  BUN 8 - 23 mg/dL 13 13 13   Creatinine 0.44 - 1.00 mg/dL 1.04(H) 0.99 0.89  Sodium 135 - 145 mmol/L 141 141 135  Potassium 3.5 - 5.1 mmol/L 3.5 3.9 3.7  Chloride 98 - 111 mmol/L 104 102 100  CO2 22 - 32 mmol/L 26 25 26   Calcium 8.9 - 10.3 mg/dL 9.6 9.7 8.8(L)  Total Protein 6.0 - 8.5 g/dL - 6.6 7.0  Total Bilirubin 0.0 - 1.2 mg/dL - 0.5 0.5  Alkaline Phos 39 - 117 IU/L - 104 88  AST 0 - 40 IU/L - 15 19  ALT 0 - 32 IU/L - 11 12   CBC Latest Ref Rng & Units 10/17/2019 10/14/2019 12/10/2018  WBC 4.0 - 10.5 K/uL 6.1 6.6 6.0  Hemoglobin 12.0 - 15.0 g/dL 13.7 12.8 12.6  Hematocrit 36.0 - 46.0 % 42.3 37.3 39.4  Platelets 150 - 400 K/uL 340 329 303   Lipid Panel     Component Value Date/Time   CHOL 174 10/14/2019 1311   TRIG 73 10/14/2019 1311   HDL 88 10/14/2019 1311   LDLCALC 72 10/14/2019 1311   HEMOGLOBIN A1C No results found for: HGBA1C, MPG TSH Recent Labs    10/14/19 1311  TSH 1.330   Medications and allergies   Allergies  Allergen Reactions  . Ace Inhibitors Anaphylaxis    Angioedema.  . Bee Venom Anaphylaxis  . Thorazine [Chlorpromazine] Anaphylaxis  . Wasp Venom Anaphylaxis  . Levaquin [Levofloxacin In D5w] Nausea And Vomiting    Stated by patient she could not handle levaquin even with antiemetics   . Shrimp [Shellfish Allergy] Rash and Hives    "splotches"   . Compazine [Prochlorperazine Edisylate] Other (See Comments)    Becomes anxious.  . Sulfur Itching       . Latex Rash     Current Outpatient Medications  Medication Instructions  . acetaminophen (TYLENOL) 1,000 mg, Oral, Every 6 hours PRN  . amLODipine (NORVASC) 5 mg, Oral, Daily  . aspirin EC 81 mg, Oral, Daily  . Calcium Carb-Cholecalciferol (CALCIUM 600/VITAMIN D3) 600-800 MG-UNIT TABS 5 tablets, Oral, Daily  . EPINEPHrine (EPIPEN 2-PAK) 0.3 mg, Intramuscular, Once PRN  . meloxicam (MOBIC) 15 mg, Oral, Daily  . metoprolol tartrate (LOPRESSOR) 25 mg, Oral, 2 times  daily  . nitroGLYCERIN (NITROSTAT) 0.4 mg, Sublingual, Every 5 min PRN  . pantoprazole (PROTONIX) 40 mg, Daily  . rosuvastatin (CRESTOR) 10 mg, Oral, Daily  . Thiamine HCl (VITAMIN B-1 PO) 1 tablet, Oral, Daily  . triamterene-hydrochlorothiazide (MAXZIDE-25) 37.5-25  MG tablet 1 tablet, Oral, Daily    Radiology:  No results found.  Cardiac Studies:   Echocardiogram 10/15/2019: Left ventricle cavity is normal in size. Moderate concentric hypertrophy of the left ventricle. Normal global wall motion. Normal LV systolic function with visual EF 50-55%. Doppler evidence of grade I (impaired) diastolic dysfunction, normal LAP. Left atrial cavity is mildly dilated. Moderate (Grade II) mitral regurgitation. Structurally normal tricuspid valve.  Mild to moderate tricuspid regurgitation. No evidence of pulmonary hypertension.  Carotid artery duplex 10/15/2019:  Stenosis in the right internal carotid artery (50-69%). Lower end of  spectrum with heterogenous plaque.  Stenosis in the left internal carotid artery (50-69%). Lower end of  spectrum with heterogenous plaque.  Antegrade right vertebral artery flow. Antegrade left vertebral artery  flow.  Follow up in six months is appropriate if clinically indicated.  Lexiscan (Walking with mod Bruce)Tetrofosmin Stress Test  10/20/2019: Nondiagnostic ECG stress. Mild degree large extent fixed perfusion defect located in the mid anteroseptal wall, mid inferoseptal wall, basal anteroseptal wall and basal inferoseptal wall. This is consistent with LBBB, mild ischemia cannot be completely excluded.  All segments of left ventricle demonstrated normal wall motion and thickening. Stress LV EF is normal 61%.  Low risk. No previous exam available for comparison.   Left Heart Catheterization 11/04/19:   Mid LAD lesion is 50% stenosed. Otherwise normal coronary arteries  LV end diastolic pressure is normal.  Medical therapy for CAD.  Assessment      ICD-10-CM   1. Coronary artery disease of native artery of native heart with stable angina pectoris ([place])  I25.118 Lipid Panel With LDL/HDL Ratio    Lipoprotein A (LPA)  2. Angina pectoris (HCC)  I20.9   3. Asymptomatic bilateral carotid artery stenosis  I65.23   4. Primary hypertension  I10 Lipoprotein A (LPA)    EKG 10/22/2019: Normal sinus rhythm at rate of 71 bpm, left bundle branch block.  No further analysis.  No significant change from prior EKG.    EKG 10/13/2019: Sinus rhythm with first-degree AV block at the rate of 70 bpm, LBBB.  No further analysis.  No significant change from 08/28/2016.    No orders of the defined types were placed in this encounter.   There are no discontinued medications.  Recommendations:   Amanda Lambert  is a 74 y.o. Caucasian female patient with long-standing history of LBBB, hypertension, asymptomatic bilateral carotid stenosis presents for follow-up of angina pectoris.  She has no hyperlipidemia or diabetes mellitus, but in view of persistent chest pain, underwent cardiac catheterization essentially revealing the mid LAD 50% stenosis for which medical therapy was recommended, she now presents for follow-up.  Although lipids are normal, she is willing to start statin therapy in view of bilateral asymptomatic carotid stenosis and also moderate coronary disease.  No changes in the medications were done today.  I will see her back in 3 months with repeat lipid profile and if she remains stable, on annual basis.  With regard to carotid stenosis, will continue surveillance.  I have explained to her although her lipids are normal, she does have underlying vascular disease and hence need to target LDL to closer to 40 mg.   Adrian Prows, MD, [facility] 11/18/2019, 6:36 AM [facility] [place] Office: [phone]

## 2019-11-21 ENCOUNTER — Ambulatory Visit: Payer: Medicare Other | Attending: Internal Medicine

## 2019-11-21 DIAGNOSIS — Z23 Encounter for immunization: Secondary | ICD-10-CM

## 2019-11-21 NOTE — Progress Notes (Signed)
   Covid-19 Vaccination Clinic  Name:  Amanda Lambert    MRN: [id number] DOB: 26-May-1946  11/21/2019  Amanda Lambert was observed post Covid-19 immunization for 30 minutes based on pre-vaccination screening without incidence. She was provided with Vaccine Information Sheet and instruction to access the V-Safe system.   Amanda Lambert was instructed to call 911 with any severe reactions post vaccine: Marland Kitchen Difficulty breathing  . Swelling of your face and throat  . A fast heartbeat  . A bad rash all over your body  . Dizziness and weakness    Immunizations Administered    Name Date Dose VIS Date Route   Pfizer COVID-19 Vaccine 11/21/2019  8:46 AM 0.3 mL 09/05/2019 Intramuscular   Manufacturer: [place]   Lot: [id number]   [place]: [id number]

## 2019-12-16 ENCOUNTER — Ambulatory Visit: Payer: Medicare Other | Attending: Internal Medicine

## 2019-12-16 DIAGNOSIS — Z23 Encounter for immunization: Secondary | ICD-10-CM

## 2019-12-16 NOTE — Progress Notes (Signed)
   Covid-19 Vaccination Clinic  Name:  Amanda Lambert    MRN: [id number] DOB: Apr 14, 1946  12/16/2019  [name] was observed post Covid-19 immunization for 30 minutes based on pre-vaccination screening without incident. She was provided with Vaccine Information Sheet and instruction to access the V-Safe system.   [name] was instructed to call 911 with any severe reactions post vaccine: Marland Kitchen Difficulty breathing  . Swelling of face and throat  . A fast heartbeat  . A bad rash all over body  . Dizziness and weakness   Immunizations Administered    Name Date Dose VIS Date Route   Pfizer COVID-19 Vaccine 12/16/2019  1:21 PM 0.3 mL 09/05/2019 Intramuscular   Manufacturer: [place]   Lot: [id number]   [place]: [id number]

## 2020-01-04 ENCOUNTER — Other Ambulatory Visit: Payer: Self-pay | Admitting: Cardiology

## 2020-01-04 DIAGNOSIS — I209 Angina pectoris, unspecified: Secondary | ICD-10-CM

## 2020-01-04 DIAGNOSIS — I10 Essential (primary) hypertension: Secondary | ICD-10-CM

## 2020-01-04 DIAGNOSIS — I6523 Occlusion and stenosis of bilateral carotid arteries: Secondary | ICD-10-CM

## 2020-01-15 ENCOUNTER — Other Ambulatory Visit: Payer: Self-pay | Admitting: Cardiology

## 2020-01-15 DIAGNOSIS — I25118 Atherosclerotic heart disease of native coronary artery with other forms of angina pectoris: Secondary | ICD-10-CM | POA: Diagnosis not present

## 2020-01-15 DIAGNOSIS — I10 Essential (primary) hypertension: Secondary | ICD-10-CM | POA: Diagnosis not present

## 2020-01-16 LAB — LIPID PANEL WITH LDL/HDL RATIO
Cholesterol, Total: 131 mg/dL (ref 100–199)
HDL: 78 mg/dL (ref >39)
LDL Chol Calc (NIH): 39 mg/dL (ref 0–99)
LDL/HDL Ratio: 0.5 ratio (ref 0.0–3.2)
Triglycerides: 72 mg/dL (ref 0–149)
VLDL Cholesterol Cal: 14 mg/dL (ref 5–40)

## 2020-01-16 LAB — LIPOPROTEIN A (LPA): Lipoprotein (a): 20.8 nmol/L (ref <75.0)

## 2020-02-16 ENCOUNTER — Ambulatory Visit: Payer: Medicare Other | Admitting: Cardiology

## 2020-02-16 ENCOUNTER — Encounter: Payer: Self-pay | Admitting: Cardiology

## 2020-02-16 ENCOUNTER — Other Ambulatory Visit: Payer: Self-pay

## 2020-02-16 VITALS — BP 116/61 | HR 83 | Resp 17 | Ht 67.0 in | Wt 206.0 lb

## 2020-02-16 DIAGNOSIS — I25118 Atherosclerotic heart disease of native coronary artery with other forms of angina pectoris: Secondary | ICD-10-CM

## 2020-02-16 DIAGNOSIS — I10 Essential (primary) hypertension: Secondary | ICD-10-CM | POA: Diagnosis not present

## 2020-02-16 DIAGNOSIS — I201 Angina pectoris with documented spasm: Secondary | ICD-10-CM

## 2020-02-16 MED ORDER — AMLODIPINE BESYLATE 10 MG PO TABS: 10.0000 mg | ORAL_TABLET | Freq: Every day | ORAL | 3 refills | Status: DC

## 2020-02-16 NOTE — Progress Notes (Signed)
Primary Physician/Referring:  Amanda Huddle, MD  Patient ID: Amanda Lambert, female    DOB: 14-May-1946, 74 y.o.   MRN: [id number]  Chief Complaint  Patient presents with  . Coronary Artery Disease  . Follow-up   HPI:    Amanda Lambert  is a 74 y.o. Caucasian female patient with long-standing history of LBBB, hypertension, asymptomatic bilateral carotid stenosis presents for follow-up of angina pectoris.  Cardiac catheterization on 11/04/2019 had revealed a mid LAD 50% stenosis.  She now notices most of her symptoms are present during nonexertional activity than exertional activity.  In view of CAD and symptoms suggestive of angina, bilateral asymptomatic carotid stenosis, I had started around Crestor 10 mg which she is tolerating.  She has not used any sublingual nitroglycerin in the past 3 months.  Past Medical History:  Diagnosis Date  . Arthritis    right elbow  . Biliary dyskinesia   . Constipation   . GERD (gastroesophageal reflux disease)   . Hepatitis B 1975  . Hypertension   . Nausea   . PONV (postoperative nausea and vomiting)    slow to wake up  . [place] spotted fever 04/01/2012   adm. to hospital  . Syncope 10/17/2019  . Weakness    Past Surgical History:  Procedure Laterality Date  . APPENDECTOMY  1963  . CHOLECYSTECTOMY  04/26/2012   Procedure: LAPAROSCOPIC CHOLECYSTECTOMY WITH INTRAOPERATIVE CHOLANGIOGRAM;  Surgeon: Merrie Roof, MD;  Location: [facility];  Service: General;  Laterality: N/A;  . DILATION AND CURETTAGE OF UTERUS  1982   for miscarriage  . DILATION AND CURETTAGE, DIAGNOSTIC / THERAPEUTIC  1972  . LEFT HEART CATH AND CORONARY ANGIOGRAPHY N/A 11/04/2019   Procedure: LEFT HEART CATH AND CORONARY ANGIOGRAPHY;  Surgeon: Adrian Prows, MD;  Location: [facility];  Service: Cardiovascular;  Laterality: N/A;  . TONSILLECTOMY  1964  - approximate  . WRIST FRACTURE SURGERY  2008   right   Family History  Problem Relation Age of Onset  .  Cardiomyopathy Mother   . Coronary artery disease Father   . Hypertension Brother   . Prostate cancer Brother    Social History   Tobacco Use  . Smoking status: Never Smoker  . Smokeless tobacco: Never Used  Substance Use Topics  . Alcohol use: No   Marital Status: Widowed ROS  Review of Systems  Cardiovascular: Positive for chest pain and dyspnea on exertion. Negative for leg swelling.  Musculoskeletal: Positive for joint pain.  Gastrointestinal: Negative for melena.   Objective  Blood pressure 116/61, pulse 83, resp. rate 17, height 5\' 7"  (1.702 m), weight 206 lb (93.4 kg), SpO2 96 %.  Vitals with BMI 02/16/2020 11/17/2019 11/04/2019  Height 5\' 7"  5\' 7"  -  Weight 206 lbs 205 lbs 11 oz -  BMI [id number] 0000000 -  Systolic [id number] 0000000 [id number]  Diastolic 61 65 59  Pulse 83 66 -  Some encounter information is confidential and restricted. Go to Review Flowsheets activity to see all data.     Physical Exam  Constitutional:  Well-built and mildly obese in no acute distress.  Cardiovascular: Normal rate, regular rhythm, normal heart sounds and intact distal pulses. Exam reveals no gallop.  No murmur heard. Pulses:      Carotid pulses are on the right side with bruit. No leg edema, no JVD. Prominent right carotid bruit.  Otherwise normal vascular exam.  Pulmonary/Chest: Effort normal and breath sounds normal.  Abdominal: Soft. Bowel  sounds are normal.   Laboratory examination:   Recent Labs    10/14/19 1311 10/17/19 1524  NA 141 141  K 3.9 3.5  CL 102 104  CO2 25 26  GLUCOSE 93 104*  BUN 13 13  CREATININE 0.99 1.04*  CALCIUM 9.7 9.6  GFRNONAA 57* 54*  GFRAA 66 >60   CrCl cannot be calculated (Patient's most recent lab result is older than the maximum 21 days allowed.).  CMP Latest Ref Rng & Units 10/17/2019 10/14/2019 12/10/2018  Glucose 70 - 99 mg/dL 104(H) 93 102(H)  BUN 8 - 23 mg/dL 13 13 13   Creatinine 0.44 - 1.00 mg/dL 1.04(H) 0.99 0.89  Sodium 135 - 145 mmol/L 141 141 135   Potassium 3.5 - 5.1 mmol/L 3.5 3.9 3.7  Chloride 98 - 111 mmol/L 104 102 100  CO2 22 - 32 mmol/L 26 25 26   Calcium 8.9 - 10.3 mg/dL 9.6 9.7 8.8(L)  Total Protein 6.0 - 8.5 g/dL - 6.6 7.0  Total Bilirubin 0.0 - 1.2 mg/dL - 0.5 0.5  Alkaline Phos 39 - 117 IU/L - 104 88  AST 0 - 40 IU/L - 15 19  ALT 0 - 32 IU/L - 11 12   CBC Latest Ref Rng & Units 10/17/2019 10/14/2019 12/10/2018  WBC 4.0 - 10.5 K/uL 6.1 6.6 6.0  Hemoglobin 12.0 - 15.0 g/dL 13.7 12.8 12.6  Hematocrit 36.0 - 46.0 % 42.3 37.3 39.4  Platelets 150 - 400 K/uL 340 329 303   Lipid Panel     Component Value Date/Time   CHOL 131 01/15/2020 0951   TRIG 72 01/15/2020 0951   HDL 78 01/15/2020 0951   LDLCALC 39 01/15/2020 0951   HEMOGLOBIN A1C No results found for: HGBA1C, MPG TSH Recent Labs    10/14/19 1311  TSH 1.330   External Labs: None  Medications and allergies   Allergies  Allergen Reactions  . Ace Inhibitors Anaphylaxis    Angioedema.  . Bee Venom Anaphylaxis  . Thorazine [Chlorpromazine] Anaphylaxis  . Wasp Venom Anaphylaxis  . Levaquin [Levofloxacin In D5w] Nausea And Vomiting    Stated by patient she could not handle levaquin even with antiemetics   . Shrimp [Shellfish Allergy] Rash and Hives    "splotches"   . Compazine [Prochlorperazine Edisylate] Other (See Comments)    Becomes anxious.  . Sulfur Itching       . Latex Rash     Current Outpatient Medications  Medication Instructions  . acetaminophen (TYLENOL) 1,000 mg, Oral, Every 6 hours PRN  . amLODipine (NORVASC) 10 mg, Oral, Daily  . aspirin EC 81 mg, Oral, Daily  . Calcium Carb-Cholecalciferol (CALCIUM 600/VITAMIN D3) 600-800 MG-UNIT TABS 5 tablets, Oral, Daily  . EPINEPHrine (EPIPEN 2-PAK) 0.3 mg, Intramuscular, Once PRN  . meloxicam (MOBIC) 15 mg, Oral, Daily  . nitroGLYCERIN (NITROSTAT) 0.4 mg, Sublingual, Every 5 min PRN  . pantoprazole (PROTONIX) 40 mg, Daily  . rosuvastatin (CRESTOR) 10 MG tablet TAKE 1 TABLET BY MOUTH EVERY  DAY  . Thiamine HCl (VITAMIN B-1 PO) 1 tablet, Oral, Daily  . triamterene-hydrochlorothiazide (MAXZIDE-25) 37.5-25 MG tablet 1 tablet, Oral, Daily    Radiology:  No results found.  Cardiac Studies:   Echocardiogram 10/15/2019: Left ventricle cavity is normal in size. Moderate concentric hypertrophy of the left ventricle. Normal global wall motion. Normal LV systolic function with visual EF 50-55%. Doppler evidence of grade I (impaired) diastolic dysfunction, normal LAP. Left atrial cavity is mildly dilated. Moderate (Grade II) mitral regurgitation. Structurally  normal tricuspid valve.  Mild to moderate tricuspid regurgitation. No evidence of pulmonary hypertension.  Carotid artery duplex 10/15/2019:  Stenosis in the right internal carotid artery (50-69%). Lower end of  spectrum with heterogenous plaque.  Stenosis in the left internal carotid artery (50-69%). Lower end of  spectrum with heterogenous plaque.  Antegrade right vertebral artery flow. Antegrade left vertebral artery  flow.  Follow up in six months is appropriate if clinically indicated.  Lexiscan (Walking with mod Bruce)Tetrofosmin Stress Test  10/20/2019: Nondiagnostic ECG stress. Mild degree large extent fixed perfusion defect located in the mid anteroseptal wall, mid inferoseptal wall, basal anteroseptal wall and basal inferoseptal wall. This is consistent with LBBB, mild ischemia cannot be completely excluded.  All segments of left ventricle demonstrated normal wall motion and thickening. Stress LV EF is normal 61%.  Low risk. No previous exam available for comparison.   Left Heart Catheterization 11/04/19:   Mid LAD lesion is 50% stenosed. Otherwise normal coronary arteries  LV end diastolic pressure is normal.  Medical therapy for CAD.   EKG   10/22/2019: Normal sinus rhythm at rate of 71 bpm, left bundle branch block.  No further analysis.  No significant change from prior EKG.    10/13/2019: Sinus rhythm  with first-degree AV block at the rate of 70 bpm, LBBB.  No further analysis.  No significant change from 08/28/2016.    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 Coronary artery disease of native artery of native heart with stable angina pectoris ([place])  I25.118   3. Primary hypertension  I10      Meds ordered this encounter  Medications  . amLODipine (NORVASC) 10 MG tablet    Sig: Take 1 tablet (10 mg total) by mouth daily.    Dispense:  90 tablet    Refill:  3    Medications Discontinued During This Encounter  Medication Reason  . amLODipine (NORVASC) 5 MG tablet   . metoprolol (LOPRESSOR) 50 MG tablet Change in therapy    Recommendations:   MADALEN OZAN  is a 74 y.o. Caucasian female patient with long-standing history of LBBB, hypertension, asymptomatic bilateral carotid stenosis presents for follow-up of angina pectoris.  Cardiac catheterization on 11/04/2019 had revealed a mid LAD 50% stenosis.  She now notices most of her symptoms are present during nonexertional activity than exertional activity.  She has no hyperlipidemia or diabetes mellitus, but in view of persistent anginal pain and carotid stenosis she is on Crestor which she is tolerating.  I reviewed her carotid artery duplex again and she will need continued surveillance.  Lipids are not well controlled with the LDL goal closer to 40 mg.  I will discontinue metoprolol and see if increasing amlodipine to 10 mg would help with both anginal symptoms and continue to control blood pressure.  Unless she has any other new problems, I will see her back in 6 months.  Adrian Prows, MD, [facility] 02/16/2020, 8:44 PM [facility] [place] Pager: [phone] Office: [phone]

## 2020-03-11 ENCOUNTER — Other Ambulatory Visit: Payer: Self-pay

## 2020-03-11 DIAGNOSIS — I201 Angina pectoris with documented spasm: Secondary | ICD-10-CM

## 2020-03-11 MED ORDER — AMLODIPINE BESYLATE 10 MG PO TABS: 10.0000 mg | ORAL_TABLET | Freq: Every day | ORAL | 3 refills | Status: DC

## 2020-04-05 ENCOUNTER — Telehealth: Payer: Self-pay

## 2020-04-05 DIAGNOSIS — I201 Angina pectoris with documented spasm: Secondary | ICD-10-CM

## 2020-04-05 DIAGNOSIS — R600 Localized edema: Secondary | ICD-10-CM

## 2020-04-05 MED ORDER — FUROSEMIDE 20 MG PO TABS: 20.0000 mg | ORAL_TABLET | Freq: Every day | ORAL | 1 refills | Status: DC | PRN

## 2020-04-05 NOTE — Telephone Encounter (Signed)
For Prinzmetal's angina pectoris, advised the patient to increase amlodipine from 10 mg daily to 10 mg in the morning and 5 mg in the evening.  She has developed leg edema and I will try furosemide on a as needed basis daily.    ICD-10-CM   1. Bilateral leg edema  R60.0 furosemide (LASIX) 20 MG tablet  2. 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 acetaminophen (TYLENOL) 1,000 mg, Oral, Every 6 hours PRN  . amLODipine (NORVASC) 10 mg, Oral, As directed, One tablet by mouth in morning and 1/2 tablet in evening  . aspirin EC 81 mg, Oral, Daily  . Calcium Carb-Cholecalciferol (CALCIUM 600/VITAMIN D3) 600-800 MG-UNIT TABS 5 tablets, Oral, Daily  . EPINEPHrine (EPIPEN 2-PAK) 0.3 mg, Intramuscular, Once PRN  . furosemide (LASIX) 20 mg, Oral, Daily PRN  . meloxicam (MOBIC) 15 mg, Oral, Daily  . nitroGLYCERIN (NITROSTAT) 0.4 mg, Sublingual, Every 5 min PRN  . pantoprazole (PROTONIX) 40 mg, Daily  . rosuvastatin (CRESTOR) 10 MG tablet TAKE 1 TABLET BY MOUTH EVERY DAY  . Thiamine HCl (VITAMIN B-1 PO) 1 tablet, Oral, Daily  . triamterene-hydrochlorothiazide (MAXZIDE-25) 37.5-25 MG tablet 1 tablet, Oral, Daily     Adrian Prows, MD, [facility] 04/05/2020, 1:44 PM Office: [phone]

## 2020-04-05 NOTE — Telephone Encounter (Signed)
From pt please read

## 2020-04-05 NOTE — Telephone Encounter (Signed)
Pt called to inform us that she has had some cp and would like to see if you could increase her Amlodpine. Due that she was here 02/13/2020 and you increased her amlodipine 10mg 

## 2020-04-06 ENCOUNTER — Other Ambulatory Visit: Payer: Self-pay

## 2020-04-06 ENCOUNTER — Ambulatory Visit: Payer: Medicare Other

## 2020-04-06 DIAGNOSIS — I6523 Occlusion and stenosis of bilateral carotid arteries: Secondary | ICD-10-CM

## 2020-04-11 ENCOUNTER — Other Ambulatory Visit: Payer: Self-pay | Admitting: Cardiology

## 2020-04-11 DIAGNOSIS — I6523 Occlusion and stenosis of bilateral carotid arteries: Secondary | ICD-10-CM

## 2020-04-23 ENCOUNTER — Other Ambulatory Visit: Payer: Medicare Other

## 2020-04-27 ENCOUNTER — Other Ambulatory Visit: Payer: Self-pay | Admitting: Cardiology

## 2020-04-27 DIAGNOSIS — R600 Localized edema: Secondary | ICD-10-CM

## 2020-05-18 ENCOUNTER — Other Ambulatory Visit: Payer: Self-pay | Admitting: Cardiology

## 2020-05-18 DIAGNOSIS — I209 Angina pectoris, unspecified: Secondary | ICD-10-CM

## 2020-05-18 DIAGNOSIS — I6523 Occlusion and stenosis of bilateral carotid arteries: Secondary | ICD-10-CM

## 2020-05-30 DIAGNOSIS — M7071 Other bursitis of hip, right hip: Secondary | ICD-10-CM | POA: Diagnosis not present

## 2020-06-03 DIAGNOSIS — M25551 Pain in right hip: Secondary | ICD-10-CM | POA: Diagnosis not present

## 2020-06-03 DIAGNOSIS — M545 Low back pain: Secondary | ICD-10-CM | POA: Diagnosis not present

## 2020-06-08 ENCOUNTER — Other Ambulatory Visit: Payer: Self-pay

## 2020-06-08 DIAGNOSIS — I201 Angina pectoris with documented spasm: Secondary | ICD-10-CM

## 2020-06-08 MED ORDER — AMLODIPINE BESYLATE 10 MG PO TABS: 10.0000 mg | ORAL_TABLET | ORAL | 3 refills | Status: DC

## 2020-06-08 NOTE — Telephone Encounter (Signed)
From Pt

## 2020-06-08 NOTE — Telephone Encounter (Signed)
Message I sent to patient: "Your leg swelling is coming from amlodipine. So we should stop this and try another medication. If you agree, I will send Rx for labetalol 200 mg twice daily. Continue lasix daily for now and can take 2 tablets for next 3-4 days. Let me know if okay to change ."

## 2020-06-11 ENCOUNTER — Other Ambulatory Visit: Payer: Self-pay

## 2020-06-11 MED ORDER — LABETALOL HCL 200 MG PO TABS: 200.0000 mg | ORAL_TABLET | Freq: Two times a day (BID) | ORAL | 1 refills | Status: DC

## 2020-06-15 DIAGNOSIS — R509 Fever, unspecified: Secondary | ICD-10-CM | POA: Diagnosis not present

## 2020-06-15 DIAGNOSIS — R5383 Other fatigue: Secondary | ICD-10-CM | POA: Diagnosis not present

## 2020-06-17 DIAGNOSIS — R5383 Other fatigue: Secondary | ICD-10-CM | POA: Diagnosis not present

## 2020-06-22 DIAGNOSIS — N3000 Acute cystitis without hematuria: Secondary | ICD-10-CM | POA: Diagnosis not present

## 2020-06-22 DIAGNOSIS — R600 Localized edema: Secondary | ICD-10-CM | POA: Diagnosis not present

## 2020-06-22 DIAGNOSIS — R5383 Other fatigue: Secondary | ICD-10-CM | POA: Diagnosis not present

## 2020-06-24 NOTE — Telephone Encounter (Signed)
From pt

## 2020-08-03 DIAGNOSIS — Z23 Encounter for immunization: Secondary | ICD-10-CM | POA: Diagnosis not present

## 2020-10-18 ENCOUNTER — Encounter: Payer: Self-pay | Admitting: Cardiology

## 2020-11-03 ENCOUNTER — Other Ambulatory Visit: Payer: Self-pay

## 2020-11-03 ENCOUNTER — Ambulatory Visit: Payer: Medicare Other

## 2020-11-03 DIAGNOSIS — I6523 Occlusion and stenosis of bilateral carotid arteries: Secondary | ICD-10-CM

## 2020-11-09 ENCOUNTER — Other Ambulatory Visit: Payer: Self-pay | Admitting: Cardiology

## 2020-11-09 DIAGNOSIS — I6523 Occlusion and stenosis of bilateral carotid arteries: Secondary | ICD-10-CM

## 2020-12-07 ENCOUNTER — Other Ambulatory Visit: Payer: Self-pay | Admitting: Cardiology

## 2020-12-14 ENCOUNTER — Other Ambulatory Visit: Payer: Self-pay

## 2020-12-14 DIAGNOSIS — I6523 Occlusion and stenosis of bilateral carotid arteries: Secondary | ICD-10-CM | POA: Diagnosis not present

## 2020-12-14 DIAGNOSIS — I10 Essential (primary) hypertension: Secondary | ICD-10-CM | POA: Diagnosis not present

## 2020-12-14 DIAGNOSIS — I201 Angina pectoris with documented spasm: Secondary | ICD-10-CM

## 2020-12-14 DIAGNOSIS — I25118 Atherosclerotic heart disease of native coronary artery with other forms of angina pectoris: Secondary | ICD-10-CM

## 2020-12-14 DIAGNOSIS — R600 Localized edema: Secondary | ICD-10-CM

## 2020-12-15 LAB — LIPID PANEL WITH LDL/HDL RATIO
Cholesterol, Total: 142 mg/dL (ref 100–199)
HDL: 79 mg/dL (ref >39)
LDL Chol Calc (NIH): 46 mg/dL (ref 0–99)
LDL/HDL Ratio: 0.6 ratio (ref 0.0–3.2)
Triglycerides: 95 mg/dL (ref 0–149)
VLDL Cholesterol Cal: 17 mg/dL (ref 5–40)

## 2020-12-15 LAB — LIPOPROTEIN A (LPA): Lipoprotein (a): 11.7 nmol/L (ref <75.0)

## 2020-12-15 NOTE — Progress Notes (Signed)
Normal lipid panel.

## 2021-01-25 DIAGNOSIS — Z0001 Encounter for general adult medical examination with abnormal findings: Secondary | ICD-10-CM | POA: Diagnosis not present

## 2021-01-25 DIAGNOSIS — T63441A Toxic effect of venom of bees, accidental (unintentional), initial encounter: Secondary | ICD-10-CM | POA: Diagnosis not present

## 2021-01-25 DIAGNOSIS — Z1389 Encounter for screening for other disorder: Secondary | ICD-10-CM | POA: Diagnosis not present

## 2021-01-25 DIAGNOSIS — I25119 Atherosclerotic heart disease of native coronary artery with unspecified angina pectoris: Secondary | ICD-10-CM | POA: Diagnosis not present

## 2021-01-25 DIAGNOSIS — Z79899 Other long term (current) drug therapy: Secondary | ICD-10-CM | POA: Diagnosis not present

## 2021-01-25 DIAGNOSIS — Z8719 Personal history of other diseases of the digestive system: Secondary | ICD-10-CM | POA: Diagnosis not present

## 2021-01-25 DIAGNOSIS — I10 Essential (primary) hypertension: Secondary | ICD-10-CM | POA: Diagnosis not present

## 2021-01-25 DIAGNOSIS — E559 Vitamin D deficiency, unspecified: Secondary | ICD-10-CM | POA: Diagnosis not present

## 2021-01-25 DIAGNOSIS — K5900 Constipation, unspecified: Secondary | ICD-10-CM | POA: Diagnosis not present

## 2021-01-25 DIAGNOSIS — K580 Irritable bowel syndrome with diarrhea: Secondary | ICD-10-CM | POA: Diagnosis not present

## 2021-01-25 DIAGNOSIS — K219 Gastro-esophageal reflux disease without esophagitis: Secondary | ICD-10-CM | POA: Diagnosis not present

## 2021-01-25 DIAGNOSIS — I779 Disorder of arteries and arterioles, unspecified: Secondary | ICD-10-CM | POA: Diagnosis not present

## 2021-02-16 ENCOUNTER — Encounter: Payer: Self-pay | Admitting: Cardiology

## 2021-02-16 ENCOUNTER — Ambulatory Visit: Payer: Medicare Other | Admitting: Cardiology

## 2021-02-16 ENCOUNTER — Other Ambulatory Visit: Payer: Self-pay

## 2021-02-16 VITALS — BP 123/73 | HR 85 | Temp 98.0°F | Resp 17 | Ht 67.0 in | Wt 197.8 lb

## 2021-02-16 DIAGNOSIS — I25118 Atherosclerotic heart disease of native coronary artery with other forms of angina pectoris: Secondary | ICD-10-CM

## 2021-02-16 DIAGNOSIS — I10 Essential (primary) hypertension: Secondary | ICD-10-CM

## 2021-02-16 DIAGNOSIS — I447 Left bundle-branch block, unspecified: Secondary | ICD-10-CM

## 2021-02-16 DIAGNOSIS — I6523 Occlusion and stenosis of bilateral carotid arteries: Secondary | ICD-10-CM

## 2021-02-16 DIAGNOSIS — E7800 Pure hypercholesterolemia, unspecified: Secondary | ICD-10-CM

## 2021-02-16 DIAGNOSIS — I201 Angina pectoris with documented spasm: Secondary | ICD-10-CM | POA: Diagnosis not present

## 2021-02-16 NOTE — Progress Notes (Signed)
Primary Physician/Referring:  Marden Noble, MD  Patient ID: Amanda Lambert, female    DOB: Jan 02, 1946, 75 y.o.   MRN: [id number]  Chief Complaint  Patient presents with  . carotid stenosis  . Hyperlipidemia  . Follow-up    1 year   HPI:    Amanda Lambert  is a 74 y.o. Caucasian female patient with long-standing history of LBBB, hypertension, asymptomatic bilateral carotid stenosis presents for follow-up of angina pectoris.  Cardiac catheterization on 11/04/2019 had revealed a mid LAD 50% stenosis.  This is 75-month office visit, states that she has noticed as someone is sitting and she has to do yard work, she has to stop frequently due to chest tightness.    She was prescribed isosorbide mononitrate by her PCP which she has not started yet. She does have sublingual nitroglycerin but has not used it as chest pain is easily relieved with rest.  She is tolerating all her medications well including Crestor.     Past Medical History:  Diagnosis Date  . Arthritis    right elbow  . Biliary dyskinesia   . Constipation   . GERD (gastroesophageal reflux disease)   . Hepatitis B 1975  . Hypertension   . Nausea   . PONV (postoperative nausea and vomiting)    slow to wake up  . [place] spotted fever 04/01/2012   adm. to hospital  . Syncope 10/17/2019  . Weakness    Past Surgical History:  Procedure Laterality Date  . APPENDECTOMY  1963  . CHOLECYSTECTOMY  04/26/2012   Procedure: LAPAROSCOPIC CHOLECYSTECTOMY WITH INTRAOPERATIVE CHOLANGIOGRAM;  Surgeon: Robyne Askew, MD;  Location: [facility];  Service: General;  Laterality: N/A;  . DILATION AND CURETTAGE OF UTERUS  1982   for miscarriage  . DILATION AND CURETTAGE, DIAGNOSTIC / THERAPEUTIC  1972  . LEFT HEART CATH AND CORONARY ANGIOGRAPHY N/A 11/04/2019   Procedure: LEFT HEART CATH AND CORONARY ANGIOGRAPHY;  Surgeon: Yates Decamp, MD;  Location: [facility];  Service: Cardiovascular;  Laterality: N/A;  . TONSILLECTOMY  1964  -  approximate  . WRIST FRACTURE SURGERY  2008   right   Family History  Problem Relation Age of Onset  . Cardiomyopathy Mother   . Coronary artery disease Father   . Hypertension Brother   . Prostate cancer Brother    Social History   Tobacco Use  . Smoking status: Never Smoker  . Smokeless tobacco: Never Used  Substance Use Topics  . Alcohol use: No   Marital Status: Widowed ROS  Review of Systems  Cardiovascular: Positive for chest pain and dyspnea on exertion. Negative for leg swelling.  Musculoskeletal: Positive for joint pain.  Gastrointestinal: Negative for melena.   Objective  Blood pressure 123/73, pulse 85, temperature 98 F (36.7 C), temperature source Temporal, resp. rate 17, height 5\' 7"  (1.702 m), weight 197 lb 12.8 oz (89.7 kg), SpO2 96 %.  Vitals with BMI 02/16/2021 02/16/2020 11/17/2019  Height 5\' 7"  5\' 7"  5\' 7"   Weight 197 lbs 13 oz 206 lbs 205 lbs 11 oz  BMI 30.97 32.26 32.21  Systolic 123 116 11/19/2019  Diastolic 73 61 65  Pulse 85 83 66  Some encounter information is confidential and restricted. Go to Review Flowsheets activity to see all data.     Physical Exam Constitutional:      Comments: Well-built and mildly obese in no acute distress.  Neck:     Vascular: No carotid bruit or JVD.  Cardiovascular:     Rate and Rhythm: Normal rate and regular rhythm.     Pulses: Normal pulses and intact distal pulses.          Carotid pulses are on the right side with bruit.    Heart sounds: Normal heart sounds. No murmur heard. No gallop.   Pulmonary:     Effort: Pulmonary effort is normal.     Breath sounds: Normal breath sounds.  Abdominal:     General: Bowel sounds are normal.     Palpations: Abdomen is soft.  Musculoskeletal:        General: No swelling.    Laboratory examination:   No results for input(s): NA, K, CL, CO2, GLUCOSE, BUN, CREATININE, CALCIUM, GFRNONAA, GFRAA in the last 8760 hours. CrCl cannot be calculated (Patient's most recent lab  result is older than the maximum 21 days allowed.).  CMP Latest Ref Rng & Units 10/17/2019 10/14/2019 12/10/2018  Glucose 70 - 99 mg/dL 104(H) 93 102(H)  BUN 8 - 23 mg/dL $Remove'13 13 13  'HVIRfHN$ Creatinine 0.44 - 1.00 mg/dL 1.04(H) 0.99 0.89  Sodium 135 - 145 mmol/L 141 141 135  Potassium 3.5 - 5.1 mmol/L 3.5 3.9 3.7  Chloride 98 - 111 mmol/L 104 102 100  CO2 22 - 32 mmol/L $RemoveB'26 25 26  'GKoYGihS$ Calcium 8.9 - 10.3 mg/dL 9.6 9.7 8.8(L)  Total Protein 6.0 - 8.5 g/dL - 6.6 7.0  Total Bilirubin 0.0 - 1.2 mg/dL - 0.5 0.5  Alkaline Phos 39 - 117 IU/L - 104 88  AST 0 - 40 IU/L - 15 19  ALT 0 - 32 IU/L - 11 12   CBC Latest Ref Rng & Units 10/17/2019 10/14/2019 12/10/2018  WBC 4.0 - 10.5 K/uL 6.1 6.6 6.0  Hemoglobin 12.0 - 15.0 g/dL 13.7 12.8 12.6  Hematocrit 36.0 - 46.0 % 42.3 37.3 39.4  Platelets 150 - 400 K/uL 340 329 303   Lipid Panel     Component Value Date/Time   CHOL 142 12/14/2020 1506   TRIG 95 12/14/2020 1506   HDL 79 12/14/2020 1506   LDLCALC 46 12/14/2020 1506   HEMOGLOBIN A1C No results found for: HGBA1C, MPG TSH No results for input(s): TSH in the last 8760 hours.   External Labs:  Labs 01/25/2021:  Hb 11.88/HCT 35.3, platelets 273, normal indicis.  Serum glucose 106 mg, BUN 12, creatinine 1.08, EGFR 54 mL, potassium 3.8, CMP otherwise normal.  Total cholesterol 131, triglycerides 105, HDL 71, LDL 41.  Medications and allergies   Allergies  Allergen Reactions  . Ace Inhibitors Anaphylaxis    Angioedema.  . Bee Venom Anaphylaxis  . Thorazine [Chlorpromazine] Anaphylaxis  . Wasp Venom Anaphylaxis  . Levaquin [Levofloxacin In D5w] Nausea And Vomiting    Stated by patient she could not handle levaquin even with antiemetics   . Shrimp [Shellfish Allergy] Rash and Hives    "splotches"   . Amlodipine     Other reaction(s): severe leg edema  . Compazine [Prochlorperazine Edisylate] Other (See Comments)    Becomes anxious.  . Elemental Sulfur Itching       . Latex Rash    Current  Outpatient Medications on File Prior to Visit  Medication Sig Dispense Refill  . acetaminophen (TYLENOL) 500 MG tablet Take 1,000 mg by mouth every 6 (six) hours as needed (pain).    Marland Kitchen aspirin EC 81 MG tablet Take 1 tablet (81 mg total) by mouth daily. 90 tablet 3  . Calcium Carb-Cholecalciferol (CALCIUM 600/VITAMIN D3)  600-800 MG-UNIT TABS Take 5 tablets by mouth daily.    Marland Kitchen EPINEPHrine 0.3 mg/0.3 mL IJ SOAJ injection Inject 0.3 mg into the muscle once as needed for anaphylaxis.    Marland Kitchen esomeprazole (NEXIUM) 40 MG capsule Take 40 mg by mouth daily at 12 noon.    . furosemide (LASIX) 20 MG tablet Take 1 tablet (20 mg total) by mouth daily as needed for edema. 90 tablet 3  . gabapentin (NEURONTIN) 300 MG capsule Take 1 capsule by mouth daily.    . isosorbide dinitrate (ISORDIL) 10 MG tablet Take 10 mg by mouth 2 (two) times daily.    Marland Kitchen labetalol (NORMODYNE) 200 MG tablet TAKE 1 TABLET BY MOUTH TWICE A DAY (Patient taking differently: 100MG  TWICE DAILY) 180 tablet 1  . meloxicam (MOBIC) 15 MG tablet Take 1 tablet (15 mg total) by mouth daily. 30 tablet 3  . nitroGLYCERIN (NITROSTAT) 0.4 MG SL tablet Place 1 tablet (0.4 mg total) under the tongue every 5 (five) minutes as needed for up to 25 days for chest pain. 25 tablet 3  . rosuvastatin (CRESTOR) 10 MG tablet TAKE 1 TABLET BY MOUTH EVERY DAY 90 tablet 3  . Thiamine HCl (VITAMIN B-1 PO) Take 1 tablet by mouth daily.     Marland Kitchen triamterene-hydrochlorothiazide (MAXZIDE-25) 37.5-25 MG tablet Take 1 tablet by mouth daily.      No current facility-administered medications on file prior to visit.     Radiology:  No results found.  Cardiac Studies:   Echocardiogram 10/15/2019: Left ventricle cavity is normal in size. Moderate concentric hypertrophy of the left ventricle. Normal global wall motion. Normal LV systolic function with visual EF 50-55%. Doppler evidence of grade I (impaired) diastolic dysfunction, normal LAP. Left atrial cavity is mildly  dilated. Moderate (Grade II) mitral regurgitation. Structurally normal tricuspid valve.  Mild to moderate tricuspid regurgitation. No evidence of pulmonary hypertension.  Lexiscan (Walking with mod Bruce)Tetrofosmin Stress Test  10/20/2019: Nondiagnostic ECG stress. Mild degree large extent fixed perfusion defect located in the mid anteroseptal wall, mid inferoseptal wall, basal anteroseptal wall and basal inferoseptal wall. This is consistent with LBBB, mild ischemia cannot be completely excluded.  All segments of left ventricle demonstrated normal wall motion and thickening. Stress LV EF is normal 61%.  Low risk. No previous exam available for comparison.   Left Heart Catheterization 11/04/19:   Mid LAD lesion is 50% stenosed. Otherwise normal coronary arteries  LV end diastolic pressure is normal.  Medical therapy for CAD.     MID LAD HAZY STENOSIS  Carotid artery duplex 11/03/2020: Doppler velocity suggests stenosis in the right internal carotid artery (50-69%). Doppler velocity suggests stenosis in the left internal carotid artery (16-49%). There is moderate amount of homogenous plaque bilaterally. Antegrade right vertebral artery flow. Antegrade left vertebral artery flow. Follow up in six months is appropriate if clinically indicated. No significant change from 04/06/2020.  EKG   EKG 02/16/2021: Normal sinus rhythm at rate of 71 bpm, left atrial enlargement, left bundle branch block.  No further analysis.  No significant change from 10/22/2019.  Assessment     ICD-10-CM   1. Primary hypertension  I10 EKG 12-Lead  2. 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 LBBB (left bundle branch block)  I44.7   4. Asymptomatic bilateral carotid artery stenosis  I65.23      No orders of the defined types were placed in this encounter.   Medications Discontinued During This Encounter  Medication Reason  . pantoprazole (PROTONIX)  40 MG tablet Error    Recommendations:   Amanda Lambert  is a 75 y.o. patient with long-standing history of LBBB, hypertension, asymptomatic bilateral carotid stenosis presents for follow-up of angina pectoris.  Cardiac catheterization on 11/04/2019 had revealed a mid LAD 50% stenosis.  This is 52-month office visit, states that she has noticed as someone is sitting and she has to do yard work, she has to stop frequently due to chest tightness.    She was prescribed isosorbide mononitrate by her PCP which she has not started yet. She does have sublingual nitroglycerin but has not used it as chest pain is easily relieved with rest.  Advised her to start isosorbide mononitrate today.  No change in EKG with underlying LBBB.  She has no hyperlipidemia or diabetes mellitus, but in view of persistent anginal pain and carotid stenosis she is on Crestor which she is tolerating.  I reviewed her carotid artery duplex again and she will need continued surveillance.  Lipids are not well controlled with the LDL is at goal at @ 40 mg.  Carotid stenosis has remained stable we will continue 6 monthly surveillance.  She could not tolerate amlodipine for anginal symptoms as had discontinued metoprolol, I will start her on labetalol which she is tolerating and blood pressures well controlled.  Leg edema has resolved.   Unless she has any other new problems, I will see her back in 1 year.    Adrian Prows, MD, [facility] 02/16/2021, 11:30 PM Office: [phone] Fax: [phone] Pager: [phone]

## 2021-04-19 ENCOUNTER — Other Ambulatory Visit: Payer: Self-pay | Admitting: Cardiology

## 2021-04-19 DIAGNOSIS — R600 Localized edema: Secondary | ICD-10-CM

## 2021-04-29 ENCOUNTER — Other Ambulatory Visit: Payer: Self-pay

## 2021-04-29 ENCOUNTER — Ambulatory Visit: Payer: Medicare Other

## 2021-04-29 DIAGNOSIS — I6523 Occlusion and stenosis of bilateral carotid arteries: Secondary | ICD-10-CM

## 2021-05-04 ENCOUNTER — Telehealth: Payer: Self-pay

## 2021-05-04 NOTE — Telephone Encounter (Signed)
Please schedule for a visit with me or [facility]

## 2021-05-04 NOTE — Telephone Encounter (Signed)
Yes, please schedule her for a visit within a week with me or [facility]

## 2021-05-04 NOTE — Telephone Encounter (Signed)
Patient has been scheduled

## 2021-05-04 NOTE — Telephone Encounter (Signed)
From patient.

## 2021-05-09 ENCOUNTER — Other Ambulatory Visit: Payer: Self-pay

## 2021-05-09 ENCOUNTER — Ambulatory Visit: Payer: Medicare Other | Admitting: Student

## 2021-05-09 ENCOUNTER — Encounter: Payer: Self-pay | Admitting: Student

## 2021-05-09 VITALS — BP 114/60 | HR 69 | Temp 98.0°F | Resp 17 | Ht 67.0 in | Wt 200.0 lb

## 2021-05-09 DIAGNOSIS — I25118 Atherosclerotic heart disease of native coronary artery with other forms of angina pectoris: Secondary | ICD-10-CM | POA: Diagnosis not present

## 2021-05-09 DIAGNOSIS — I209 Angina pectoris, unspecified: Secondary | ICD-10-CM

## 2021-05-09 DIAGNOSIS — R0609 Other forms of dyspnea: Secondary | ICD-10-CM

## 2021-05-09 DIAGNOSIS — R0600 Dyspnea, unspecified: Secondary | ICD-10-CM

## 2021-05-09 DIAGNOSIS — I10 Essential (primary) hypertension: Secondary | ICD-10-CM | POA: Diagnosis not present

## 2021-05-09 NOTE — Progress Notes (Signed)
Primary Physician/Referring:  Marden Noble, MD  Patient ID: Amanda Lambert, female    DOB: 01/23/1946, 75 y.o.   MRN: [id number]  Chief Complaint  Patient presents with   CHEST DISCOMFORT   Shortness of Breath   HPI:    Amanda Lambert  is a 75 y.o. Caucasian female patient with long-standing history of LBBB, hypertension, asymptomatic bilateral carotid stenosis presents for follow-up of angina pectoris.  Cardiac catheterization on 11/04/2019 had revealed a mid LAD 50% stenosis.    Patient presents for urgent visit at her request with concerns of dyspnea on exertion and chest pain.  She states that last week she experienced 3 days of dyspnea on exertion and chest pain which limited her ability to do her daily activities.  She states that symptoms were primarily associated with exertion lasting 3 to 5 minutes.  Symptoms have significantly improved over the last several days.  However she continues to feel fatigued and have mild dyspnea on exertion.  She has not taken sublingual nitroglycerin.  She did not test herself for COVID-19 infection.  Patient is otherwise tolerating medical therapy without issue.  Denies syncope, near syncope, orthopnea, PND, leg swelling.  Past Medical History:  Diagnosis Date   Arthritis    right elbow   Biliary dyskinesia    Constipation    GERD (gastroesophageal reflux disease)    Hepatitis B 1975   Hypertension    Nausea    PONV (postoperative nausea and vomiting)    slow to wake up   [facility] spotted fever 04/01/2012   adm. to hospital   Syncope 10/17/2019   Weakness    Past Surgical History:  Procedure Laterality Date   APPENDECTOMY  1963   CHOLECYSTECTOMY  04/26/2012   Procedure: LAPAROSCOPIC CHOLECYSTECTOMY WITH INTRAOPERATIVE CHOLANGIOGRAM;  Surgeon: Robyne Askew, MD;  Location: [facility];  Service: General;  Laterality: N/A;   DILATION AND CURETTAGE OF UTERUS  1982   for miscarriage   DILATION AND CURETTAGE, DIAGNOSTIC / THERAPEUTIC  1972    LEFT HEART CATH AND CORONARY ANGIOGRAPHY N/A 11/04/2019   Procedure: LEFT HEART CATH AND CORONARY ANGIOGRAPHY;  Surgeon: Yates Decamp, MD;  Location: [facility]ASIVE [facility];  Service: Cardiovascular;  Laterality: N/A;   TONSILLECTOMY  1964  - approximate   WRIST FRACTURE SURGERY  2008   right   Family History  Problem Relation Age of Onset   Cardiomyopathy Mother    Coronary artery disease Father    Hypertension Brother    Prostate cancer Brother    Social History   Tobacco Use   Smoking status: Never   Smokeless tobacco: Never  Substance Use Topics   Alcohol use: No   Marital Status: Widowed ROS  Review of Systems  Constitutional: Negative for malaise/fatigue and weight gain.  Cardiovascular:  Positive for chest pain and dyspnea on exertion. Negative for claudication, leg swelling, near-syncope, orthopnea, palpitations, paroxysmal nocturnal dyspnea and syncope.  Respiratory:  Negative for shortness of breath.   Musculoskeletal:  Positive for joint pain.  Gastrointestinal:  Negative for melena.  Neurological:  Negative for dizziness.  Objective  Blood pressure 114/60, pulse 69, temperature 98 F (36.7 C), temperature source Temporal, resp. rate 17, height 5\' 7"  (1.702 m), weight 200 lb (90.7 kg), SpO2 97 %.  Vitals with BMI 05/09/2021 02/16/2021 02/16/2020  Height 5\' 7"  5\' 7"  5\' 7"   Weight 200 lbs 197 lbs 13 oz 206 lbs  BMI 31.32 30.97 32.26  Systolic 114 123  094  Diastolic 60 73 61  Pulse 69 85 83  Some encounter information is confidential and restricted. Go to Review Flowsheets activity to see all data.     Physical Exam Vitals reviewed.  Constitutional:      Comments: Well-built and mildly obese in no acute distress.  Neck:     Vascular: No carotid bruit or JVD.  Cardiovascular:     Rate and Rhythm: Normal rate and regular rhythm.     Pulses: Normal pulses and intact distal pulses.          Carotid pulses are  on the right side with bruit.    Heart sounds: Normal heart  sounds, S1 normal and S2 normal. No murmur heard.   No gallop.  Pulmonary:     Effort: Pulmonary effort is normal. No respiratory distress.     Breath sounds: Normal breath sounds. No wheezing, rhonchi or rales.  Musculoskeletal:     Right lower leg: No edema.     Left lower leg: No edema.  Neurological:     Mental Status: She is alert.   Laboratory examination:   No results for input(s): NA, K, CL, CO2, GLUCOSE, BUN, CREATININE, CALCIUM, GFRNONAA, GFRAA in the last 8760 hours. CrCl cannot be calculated (Patient's most recent lab result is older than the maximum 21 days allowed.).  CMP Latest Ref Rng & Units 10/17/2019 10/14/2019 12/10/2018  Glucose 70 - 99 mg/dL 104(H) 93 102(H)  BUN 8 - 23 mg/dL $Remove'13 13 13  'ssiNPok$ Creatinine 0.44 - 1.00 mg/dL 1.04(H) 0.99 0.89  Sodium 135 - 145 mmol/L 141 141 135  Potassium 3.5 - 5.1 mmol/L 3.5 3.9 3.7  Chloride 98 - 111 mmol/L 104 102 100  CO2 22 - 32 mmol/L $RemoveB'26 25 26  'nafOsDLI$ Calcium 8.9 - 10.3 mg/dL 9.6 9.7 8.8(L)  Total Protein 6.0 - 8.5 g/dL - 6.6 7.0  Total Bilirubin 0.0 - 1.2 mg/dL - 0.5 0.5  Alkaline Phos 39 - 117 IU/L - 104 88  AST 0 - 40 IU/L - 15 19  ALT 0 - 32 IU/L - 11 12   CBC Latest Ref Rng & Units 10/17/2019 10/14/2019 12/10/2018  WBC 4.0 - 10.5 K/uL 6.1 6.6 6.0  Hemoglobin 12.0 - 15.0 g/dL 13.7 12.8 12.6  Hematocrit 36.0 - 46.0 % 42.3 37.3 39.4  Platelets 150 - 400 K/uL 340 329 303   Lipid Panel     Component Value Date/Time   CHOL 142 12/14/2020 1506   TRIG 95 12/14/2020 1506   HDL 79 12/14/2020 1506   LDLCALC 46 12/14/2020 1506   HEMOGLOBIN A1C No results found for: HGBA1C, MPG TSH No results for input(s): TSH in the last 8760 hours.   External Labs:  Labs 01/25/2021:  Hb 11.88/HCT 35.3, platelets 273, normal indicis.  Serum glucose 106 mg, BUN 12, creatinine 1.08, EGFR 54 mL, potassium 3.8, CMP otherwise normal.  Total cholesterol 131, triglycerides 105, HDL 71, LDL 41. Allergies   Allergies  Allergen Reactions   Ace  Inhibitors Anaphylaxis    Angioedema.   Bee Venom Anaphylaxis   Thorazine [Chlorpromazine] Anaphylaxis   Wasp Venom Anaphylaxis   Levaquin [Levofloxacin In D5w] Nausea And Vomiting    Stated by patient she could not handle levaquin even with antiemetics    Shrimp [Shellfish Allergy] Rash and Hives    "splotches"    Amlodipine     Other reaction(s): severe leg edema   Compazine [Prochlorperazine Edisylate] Other (See Comments)    Becomes anxious.  Elemental Sulfur Itching        Latex Rash      Medications Prior to Visit:   Outpatient Medications Prior to Visit  Medication Sig Dispense Refill   acetaminophen (TYLENOL) 500 MG tablet Take 1,000 mg by mouth every 6 (six) hours as needed (pain).     aspirin EC 81 MG tablet Take 1 tablet (81 mg total) by mouth daily. 90 tablet 3   Calcium Carb-Cholecalciferol (CALCIUM 600/VITAMIN D3) 600-800 MG-UNIT TABS Take 5 tablets by mouth daily.     EPINEPHrine 0.3 mg/0.3 mL IJ SOAJ injection Inject 0.3 mg into the muscle once as needed for anaphylaxis.     esomeprazole (NEXIUM) 40 MG capsule Take 40 mg by mouth daily at 12 noon.     furosemide (LASIX) 20 MG tablet TAKE 1 TABLET (20 MG TOTAL) BY MOUTH DAILY AS NEEDED FOR EDEMA. 90 tablet 3   gabapentin (NEURONTIN) 300 MG capsule Take 1 capsule by mouth daily.     isosorbide dinitrate (ISORDIL) 10 MG tablet Take 10 mg by mouth 3 (three) times daily.     labetalol (NORMODYNE) 200 MG tablet TAKE 1 TABLET BY MOUTH TWICE A DAY (Patient taking differently: 100MG  TWICE DAILY) 180 tablet 1   meloxicam (MOBIC) 15 MG tablet Take 1 tablet (15 mg total) by mouth daily. 30 tablet 3   nitroGLYCERIN (NITROSTAT) 0.4 MG SL tablet Place 1 tablet (0.4 mg total) under the tongue every 5 (five) minutes as needed for up to 25 days for chest pain. 25 tablet 3   rosuvastatin (CRESTOR) 10 MG tablet TAKE 1 TABLET BY MOUTH EVERY DAY 90 tablet 3   Thiamine HCl (VITAMIN B-1 PO) Take 1 tablet by mouth daily.       triamterene-hydrochlorothiazide (MAXZIDE-25) 37.5-25 MG tablet Take 1 tablet by mouth daily.      isosorbide mononitrate (IMDUR) 30 MG 24 hr tablet Take 30 mg by mouth daily.     No facility-administered medications prior to visit.     Final Medications at End of Visit    Current Meds  Medication Sig   acetaminophen (TYLENOL) 500 MG tablet Take 1,000 mg by mouth every 6 (six) hours as needed (pain).   aspirin EC 81 MG tablet Take 1 tablet (81 mg total) by mouth daily.   Calcium Carb-Cholecalciferol (CALCIUM 600/VITAMIN D3) 600-800 MG-UNIT TABS Take 5 tablets by mouth daily.   EPINEPHrine 0.3 mg/0.3 mL IJ SOAJ injection Inject 0.3 mg into the muscle once as needed for anaphylaxis.   esomeprazole (NEXIUM) 40 MG capsule Take 40 mg by mouth daily at 12 noon.   furosemide (LASIX) 20 MG tablet TAKE 1 TABLET (20 MG TOTAL) BY MOUTH DAILY AS NEEDED FOR EDEMA.   gabapentin (NEURONTIN) 300 MG capsule Take 1 capsule by mouth daily.   isosorbide dinitrate (ISORDIL) 10 MG tablet Take 10 mg by mouth 3 (three) times daily.   labetalol (NORMODYNE) 200 MG tablet TAKE 1 TABLET BY MOUTH TWICE A DAY (Patient taking differently: 100MG  TWICE DAILY)   meloxicam (MOBIC) 15 MG tablet Take 1 tablet (15 mg total) by mouth daily.   nitroGLYCERIN (NITROSTAT) 0.4 MG SL tablet Place 1 tablet (0.4 mg total) under the tongue every 5 (five) minutes as needed for up to 25 days for chest pain.   rosuvastatin (CRESTOR) 10 MG tablet TAKE 1 TABLET BY MOUTH EVERY DAY   Thiamine HCl (VITAMIN B-1 PO) Take 1 tablet by mouth daily.    triamterene-hydrochlorothiazide (MAXZIDE-25) 37.5-25 MG tablet Take  1 tablet by mouth daily.     Radiology:  No results found.  Cardiac Studies:   Echocardiogram 10/15/2019: Left ventricle cavity is normal in size. Moderate concentric hypertrophy of the left ventricle. Normal global wall motion. Normal LV systolic function with visual EF 50-55%. Doppler evidence of grade I (impaired) diastolic  dysfunction, normal LAP. Left atrial cavity is mildly dilated. Moderate (Grade II) mitral regurgitation. Structurally normal tricuspid valve.  Mild to moderate tricuspid regurgitation. No evidence of pulmonary hypertension.  Lexiscan (Walking with mod Bruce)Tetrofosmin Stress Test  10/20/2019: Nondiagnostic ECG stress. Mild degree large extent fixed perfusion defect located in the mid anteroseptal wall, mid inferoseptal wall, basal anteroseptal wall and basal inferoseptal wall. This is consistent with LBBB, mild ischemia cannot be completely excluded.  All segments of left ventricle demonstrated normal wall motion and thickening. Stress LV EF is normal 61%.  Low risk. No previous exam available for comparison.   Left Heart Catheterization 11/04/19:  Mid LAD lesion is 50% stenosed. Otherwise normal coronary arteries LV end diastolic pressure is normal. Medical therapy for CAD.     MID LAD HAZY STENOSIS  Carotid artery duplex 11/03/2020: Doppler velocity suggests stenosis in the right internal carotid artery (50-69%). Doppler velocity suggests stenosis in the left internal carotid artery (16-49%). There is moderate amount of homogenous plaque bilaterally. Antegrade right vertebral artery flow. Antegrade left vertebral artery flow. Follow up in six months is appropriate if clinically indicated. No significant change from 04/06/2020.   EKG   EKG 05/09/2021: Sinus rhythm at a rate of 69 bpm.  Left atrial enlargement.  Left bundle branch block, no further analysis.  Compared to EKG 02/16/2021, no significant change.  EKG 02/16/2021: Normal sinus rhythm at rate of 71 bpm, left atrial enlargement, left bundle branch block.  No further analysis.  No significant change from 10/22/2019.  Assessment     ICD-10-CM   1. Coronary artery disease of native artery of native heart with stable angina pectoris ([place])  I25.118     2. Angina pectoris (HCC)  I20.9     3. Dyspnea on exertion  R06.00 Brain  natriuretic peptide    Basic metabolic panel    PCV ECHOCARDIOGRAM COMPLETE    4. Primary hypertension  I10 EKG 33-AQTM    Basic metabolic panel      No orders of the defined types were placed in this encounter.   Medications Discontinued During This Encounter  Medication Reason   isosorbide mononitrate (IMDUR) 30 MG 24 hr tablet Error    Recommendations:   Amanda Lambert  is a 75 y.o. patient with long-standing history of LBBB, hypertension, asymptomatic bilateral carotid stenosis presents for follow-up of angina pectoris.  Cardiac catheterization on 11/04/2019 had revealed a mid LAD 50% stenosis.    Patient presents for urgent office visit at her request with concerns of worsening dyspnea on exertion and chest pain last week.  Patient's symptoms have significantly improved over the last 4 days, however she does not yet feel back to baseline.  EKG today is unchanged compared to previous and she had cardiac catheterization done approximately 1.5 years ago with only mid LAD 50% stenosis.  Will obtain BNP and BMP.  Advised patient to take Lasix 40 mg twice daily for the next 5 days.  We will also obtain repeat echocardiogram.  Patient will monitor her symptoms closely and notify our office if symptoms worsen or fail to improve.  Also advised patient she may increase Isordil to 10 mg 3  times daily as she has been having more frequent episodes of chest discomfort.  I referred recent lipid profile testing, LDL is now at goal.  We will continue present medications otherwise.  Follow-up 4 weeks, sooner if needed, for dyspnea on exertion angina, as well as results of cardiac testing.  Patient's symptoms persist, could consider repeat ischemic evaluation.   Alethia Berthold, PA-C 05/09/2021, 4:53 PM Office: [phone]

## 2021-05-10 DIAGNOSIS — I10 Essential (primary) hypertension: Secondary | ICD-10-CM | POA: Diagnosis not present

## 2021-05-10 DIAGNOSIS — R0600 Dyspnea, unspecified: Secondary | ICD-10-CM | POA: Diagnosis not present

## 2021-05-11 LAB — BASIC METABOLIC PANEL
BUN/Creatinine Ratio: 12 (ref 12–28)
BUN: 12 mg/dL (ref 8–27)
CO2: 28 mmol/L (ref 20–29)
Calcium: 9.6 mg/dL (ref 8.7–10.3)
Chloride: 93 mmol/L — ABNORMAL LOW (ref 96–106)
Creatinine, Ser: 1 mg/dL (ref 0.57–1.00)
Glucose: 95 mg/dL (ref 65–99)
Potassium: 4.4 mmol/L (ref 3.5–5.2)
Sodium: 136 mmol/L (ref 134–144)
eGFR: 59 mL/min/{1.73_m2} — ABNORMAL LOW (ref >59)

## 2021-05-11 LAB — BRAIN NATRIURETIC PEPTIDE: BNP: 111.6 pg/mL — ABNORMAL HIGH (ref 0.0–100.0)

## 2021-05-12 NOTE — Progress Notes (Signed)
Called patient, NA, no VM-box to leave a message.

## 2021-05-12 NOTE — Progress Notes (Signed)
BNP measure of volume overload was minimally elevated.  Continue Lasix as discussed during office visit.  Renal function stable.

## 2021-05-13 NOTE — Progress Notes (Signed)
2nd attempt : Called patient, NA, LMAM

## 2021-05-18 NOTE — Progress Notes (Signed)
Called and spoke to pt regarding lab results. Pt voiced understanding.

## 2021-05-19 ENCOUNTER — Other Ambulatory Visit: Payer: Self-pay

## 2021-05-19 ENCOUNTER — Ambulatory Visit: Payer: Medicare Other

## 2021-05-19 DIAGNOSIS — R0600 Dyspnea, unspecified: Secondary | ICD-10-CM

## 2021-05-19 DIAGNOSIS — R0609 Other forms of dyspnea: Secondary | ICD-10-CM | POA: Diagnosis not present

## 2021-05-23 DIAGNOSIS — I209 Angina pectoris, unspecified: Secondary | ICD-10-CM

## 2021-05-23 MED ORDER — ISOSORBIDE DINITRATE 20 MG PO TABS: 20.0000 mg | ORAL_TABLET | Freq: Three times a day (TID) | ORAL | 3 refills | Status: DC

## 2021-05-23 NOTE — Telephone Encounter (Signed)
From pt

## 2021-05-23 NOTE — Telephone Encounter (Signed)
ICD-10-CM   1. Angina pectoris (HCC)  I20.9 isosorbide dinitrate (ISORDIL) 20 MG tablet      Medications Discontinued During This Encounter  Medication Reason   isosorbide dinitrate (ISORDIL) 10 MG tablet Dose change    Meds ordered this encounter  Medications   isosorbide dinitrate (ISORDIL) 20 MG tablet    Sig: Take 1 tablet (20 mg total) by mouth 3 (three) times daily.    Dispense:  270 tablet    Refill:  3      Adrian Prows, MD, [facility] 05/23/2021, 11:50 AM Office: [phone] Fax: [phone] Pager: [phone]

## 2021-06-06 ENCOUNTER — Ambulatory Visit: Payer: Medicare Other | Admitting: Cardiology

## 2021-06-06 ENCOUNTER — Encounter: Payer: Self-pay | Admitting: Cardiology

## 2021-06-06 ENCOUNTER — Other Ambulatory Visit: Payer: Self-pay

## 2021-06-06 VITALS — BP 118/71 | HR 78 | Temp 98.5°F | Resp 16 | Ht 67.0 in | Wt 196.4 lb

## 2021-06-06 DIAGNOSIS — I25118 Atherosclerotic heart disease of native coronary artery with other forms of angina pectoris: Secondary | ICD-10-CM | POA: Diagnosis not present

## 2021-06-06 DIAGNOSIS — I6523 Occlusion and stenosis of bilateral carotid arteries: Secondary | ICD-10-CM | POA: Diagnosis not present

## 2021-06-06 DIAGNOSIS — I201 Angina pectoris with documented spasm: Secondary | ICD-10-CM | POA: Diagnosis not present

## 2021-06-06 DIAGNOSIS — I209 Angina pectoris, unspecified: Secondary | ICD-10-CM

## 2021-06-06 NOTE — Progress Notes (Signed)
Primary Physician/Referring:  [name], MD  Patient ID: Amanda Lambert, female    DOB: January 07, 1946, 75 y.o.   MRN: [id number]  Chief Complaint  Patient presents with   Chest Pain    4 WEEKS   DOE   HPI:    Amanda Lambert  is a 75 y.o. Caucasian female patient with long-standing history of LBBB, hypertension, asymptomatic bilateral carotid stenosis presents for follow-up of angina pectoris.  Cardiac catheterization on 11/04/2019 had revealed a mid LAD 50% stenosis.    Patient was seen by me a month ago for worsening dyspnea and exertional angina pectoris.  She was started on isosorbide dinitrate, in spite of this and in spite of increasing the dose, she still continues to have severe symptoms of angina even doing routine activities around the house, she had to stop.  She has not had any rest pain or nocturnal angina.  No PND or orthopnea.  Tolerating all her medications well.  Past Medical History:  Diagnosis Date   Arthritis    right elbow   Biliary dyskinesia    Constipation    GERD (gastroesophageal reflux disease)    Hepatitis B 1975   Hypertension    Nausea    PONV (postoperative nausea and vomiting)    slow to wake up   [facility] spotted fever 04/01/2012   adm. to hospital   Syncope 10/17/2019   Weakness    Past Surgical History:  Procedure Laterality Date   APPENDECTOMY  1963   CHOLECYSTECTOMY  04/26/2012   Procedure: LAPAROSCOPIC CHOLECYSTECTOMY WITH INTRAOPERATIVE CHOLANGIOGRAM;  Surgeon: Merrie Roof, MD;  Location: [facility];  Service: General;  Laterality: N/A;   [place]   for miscarriage   DILATION AND CURETTAGE, DIAGNOSTIC / [place] CATH AND CORONARY ANGIOGRAPHY N/A 11/04/2019   Procedure: LEFT HEART CATH AND CORONARY ANGIOGRAPHY;  Surgeon: Adrian Prows, MD;  Location: [place] [facility];  Service: Cardiovascular;  Laterality: N/A;   TONSILLECTOMY  1964  - approximate   WRIST FRACTURE SURGERY  2008    right   Family History  Problem Relation Age of Onset   Cardiomyopathy Mother    Coronary artery disease Father    Hypertension Brother    Prostate cancer Brother    Social History   Tobacco Use   Smoking status: Never   Smokeless tobacco: Never  Substance Use Topics   Alcohol use: No   Marital Status: Widowed ROS  Review of Systems  Cardiovascular:  Positive for chest pain and dyspnea on exertion. Negative for claudication, leg swelling, orthopnea and palpitations.  Respiratory:  Negative for shortness of breath.   Musculoskeletal:  Positive for joint pain.  Gastrointestinal:  Negative for melena.  Neurological:  Negative for dizziness.  Objective  Blood pressure 118/71, pulse 78, temperature 98.5 F (36.9 C), temperature source Temporal, resp. rate 16, height 5' 7" (1.702 m), weight 196 lb 6.4 oz (89.1 kg), SpO2 98 %.  Vitals with BMI 06/06/2021 05/09/2021 02/16/2021  Height 5' 7" 5' 7" 5' 7"  Weight 196 lbs 6 oz 200 lbs 197 lbs 13 oz  BMI 30.75 69.45 03.88  Systolic 828 003 491  Diastolic 71 60 73  Pulse 78 69 85  Some encounter information is confidential and restricted. Go to Review Flowsheets activity to see all data.     Physical Exam Vitals reviewed.  Constitutional:      Comments: Well-built and  mildly obese in no acute distress.  Neck:     Vascular: No carotid bruit or JVD.  Cardiovascular:     Rate and Rhythm: Normal rate and regular rhythm.     Pulses: Normal pulses and intact distal pulses.          Carotid pulses are  on the right side with bruit.    Heart sounds: Normal heart sounds, S1 normal and S2 normal. No murmur heard.   No gallop.  Pulmonary:     Effort: Pulmonary effort is normal. No respiratory distress.     Breath sounds: Normal breath sounds. No wheezing, rhonchi or rales.  Musculoskeletal:     Right lower leg: No edema.     Left lower leg: No edema.   Laboratory examination:   Recent Labs    05/10/21 1143  NA 136  K 4.4  CL 93*   CO2 28  GLUCOSE 95  BUN 12  CREATININE 1.00  CALCIUM 9.6   CrCl cannot be calculated (Patient's most recent lab result is older than the maximum 21 days allowed.).  CMP Latest Ref Rng & Units 05/10/2021 10/17/2019 10/14/2019  Glucose 65 - 99 mg/dL 95 104(H) 93  BUN 8 - 27 mg/dL _0 Creatinine 0.57 - 1.00 mg/dL 1.00 1.04(H) 0.99  Sodium 134 - 144 mmol/L 136 141 141  Potassium 3.5 - 5.2 mmol/L 4.4 3.5 3.9  Chloride 96 - 106 mmol/L 93(L) 104 102  CO2 20 - 29 mmol/L _1 Calcium 8.7 - 10.3 mg/dL 9.6 9.6 9.7  Total Protein 6.0 - 8.5 g/dL - - 6.6  Total Bilirubin 0.0 - 1.2 mg/dL - - 0.5  Alkaline Phos 39 - 117 IU/L - - 104  AST 0 - 40 IU/L - - 15  ALT 0 - 32 IU/L - - 11   CBC Latest Ref Rng & Units 10/17/2019 10/14/2019 12/10/2018  WBC 4.0 - 10.5 K/uL 6.1 6.6 6.0  Hemoglobin 12.0 - 15.0 g/dL 13.7 12.8 12.6  Hematocrit 36.0 - 46.0 % 42.3 37.3 39.4  Platelets 150 - 400 K/uL 340 329 303   Lipid Panel     Component Value Date/Time   CHOL 142 12/14/2020 1506   TRIG 95 12/14/2020 1506   HDL 79 12/14/2020 1506   LDLCALC 46 12/14/2020 1506   HEMOGLOBIN A1C No results found for: HGBA1C, MPG TSH No results for input(s): TSH in the last 8760 hours.   External Labs:  Labs 01/25/2021:  Hb 11.88/HCT 35.3, platelets 273, normal indicis.  Serum glucose 106 mg, BUN 12, creatinine 1.08, EGFR 54 mL, potassium 3.8, CMP otherwise normal.  Total cholesterol 131, triglycerides 105, HDL 71, LDL 41. Allergies   Allergies  Allergen Reactions   Ace Inhibitors Anaphylaxis    Angioedema.   Bee Venom Anaphylaxis   Thorazine [Chlorpromazine] Anaphylaxis   Wasp Venom Anaphylaxis   Levaquin [Levofloxacin In D5w] Nausea And Vomiting    Stated by patient she could not handle levaquin even with antiemetics    Shrimp [Shellfish Allergy] Rash and Hives    "splotches"    Amlodipine     Other reaction(s): severe leg edema   Compazine [Prochlorperazine Edisylate] Other (See Comments)     Becomes anxious.   Elemental Sulfur Itching        Latex Rash      Medications Prior to Visit:   Outpatient Medications Prior to Visit  Medication Sig Dispense Refill   acetaminophen (TYLENOL) 500 MG tablet Take 1,000  mg by mouth every 6 (six) hours as needed (pain).     aspirin EC 81 MG tablet Take 1 tablet (81 mg total) by mouth daily. 90 tablet 3   Calcium Carb-Cholecalciferol (CALCIUM 600/VITAMIN D3) 600-800 MG-UNIT TABS Take 5 tablets by mouth daily.     EPINEPHrine 0.3 mg/0.3 mL IJ SOAJ injection Inject 0.3 mg into the muscle once as needed for anaphylaxis.     esomeprazole (NEXIUM) 40 MG capsule Take 40 mg by mouth daily at 12 noon.     furosemide (LASIX) 20 MG tablet TAKE 1 TABLET (20 MG TOTAL) BY MOUTH DAILY AS NEEDED FOR EDEMA. (Patient taking differently: Take 40 mg by mouth daily as needed for edema.) 90 tablet 3   gabapentin (NEURONTIN) 300 MG capsule Take 1 capsule by mouth daily.     isosorbide dinitrate (ISORDIL) 20 MG tablet Take 1 tablet (20 mg total) by mouth 3 (three) times daily. (Patient taking differently: Take 30 mg by mouth 3 (three) times daily.) 270 tablet 3   labetalol (NORMODYNE) 200 MG tablet TAKE 1 TABLET BY MOUTH TWICE A DAY (Patient taking differently: 100MG TWICE DAILY) 180 tablet 1   meloxicam (MOBIC) 15 MG tablet Take 1 tablet (15 mg total) by mouth daily. 30 tablet 3   nitroGLYCERIN (NITROSTAT) 0.4 MG SL tablet Place 1 tablet (0.4 mg total) under the tongue every 5 (five) minutes as needed for up to 25 days for chest pain. 25 tablet 3   rosuvastatin (CRESTOR) 10 MG tablet TAKE 1 TABLET BY MOUTH EVERY DAY (Patient taking differently: Take 10 mg by mouth daily.) 90 tablet 3   Thiamine HCl (VITAMIN B-1 PO) Take 1 tablet by mouth daily.      triamterene-hydrochlorothiazide (MAXZIDE-25) 37.5-25 MG tablet Take 1 tablet by mouth daily.      No facility-administered medications prior to visit.     Final Medications at End of Visit    Current Meds   Medication Sig   acetaminophen (TYLENOL) 500 MG tablet Take 1,000 mg by mouth every 6 (six) hours as needed (pain).   aspirin EC 81 MG tablet Take 1 tablet (81 mg total) by mouth daily.   Calcium Carb-Cholecalciferol (CALCIUM 600/VITAMIN D3) 600-800 MG-UNIT TABS Take 5 tablets by mouth daily.   EPINEPHrine 0.3 mg/0.3 mL IJ SOAJ injection Inject 0.3 mg into the muscle once as needed for anaphylaxis.   esomeprazole (NEXIUM) 40 MG capsule Take 40 mg by mouth daily at 12 noon.   furosemide (LASIX) 20 MG tablet TAKE 1 TABLET (20 MG TOTAL) BY MOUTH DAILY AS NEEDED FOR EDEMA. (Patient taking differently: Take 40 mg by mouth daily as needed for edema.)   gabapentin (NEURONTIN) 300 MG capsule Take 1 capsule by mouth daily.   isosorbide dinitrate (ISORDIL) 20 MG tablet Take 1 tablet (20 mg total) by mouth 3 (three) times daily. (Patient taking differently: Take 30 mg by mouth 3 (three) times daily.)   labetalol (NORMODYNE) 200 MG tablet TAKE 1 TABLET BY MOUTH TWICE A DAY (Patient taking differently: 100MG TWICE DAILY)   meloxicam (MOBIC) 15 MG tablet Take 1 tablet (15 mg total) by mouth daily.   nitroGLYCERIN (NITROSTAT) 0.4 MG SL tablet Place 1 tablet (0.4 mg total) under the tongue every 5 (five) minutes as needed for up to 25 days for chest pain.   rosuvastatin (CRESTOR) 10 MG tablet TAKE 1 TABLET BY MOUTH EVERY DAY (Patient taking differently: Take 10 mg by mouth daily.)   Thiamine HCl (VITAMIN B-1 PO)  Take 1 tablet by mouth daily.    triamterene-hydrochlorothiazide (MAXZIDE-25) 37.5-25 MG tablet Take 1 tablet by mouth daily.     Radiology:  No results found.  Cardiac Studies:   Echocardiogram 10/15/2019: Left ventricle cavity is normal in size. Moderate concentric hypertrophy of the left ventricle. Normal global wall motion. Normal LV systolic function with visual EF 50-55%. Doppler evidence of grade I (impaired) diastolic dysfunction, normal LAP. Left atrial cavity is mildly dilated. Moderate  (Grade II) mitral regurgitation. Structurally normal tricuspid valve.  Mild to moderate tricuspid regurgitation. No evidence of pulmonary hypertension.  Lexiscan (Walking with mod Bruce)Tetrofosmin Stress Test  10/20/2019: Nondiagnostic ECG stress. Mild degree large extent fixed perfusion defect located in the mid anteroseptal wall, mid inferoseptal wall, basal anteroseptal wall and basal inferoseptal wall. This is consistent with LBBB, mild ischemia cannot be completely excluded.  All segments of left ventricle demonstrated normal wall motion and thickening. Stress LV EF is normal 61%.  Low risk. No previous exam available for comparison.   Left Heart Catheterization 11/04/19:  Mid LAD lesion is 50% stenosed. Otherwise normal coronary arteries LV end diastolic pressure is normal. Medical therapy for CAD.     MID LAD HAZY STENOSIS  Carotid artery duplex 11/03/2020: Doppler velocity suggests stenosis in the right internal carotid artery (50-69%). Doppler velocity suggests stenosis in the left internal carotid artery (16-49%). There is moderate amount of homogenous plaque bilaterally. Antegrade right vertebral artery flow. Antegrade left vertebral artery flow. Follow up in six months is appropriate if clinically indicated. No significant change from 04/06/2020.   EKG   EKG 05/09/2021: Sinus rhythm at a rate of 69 bpm.  Left atrial enlargement.  Left bundle branch block, no further analysis.  Compared to EKG 02/16/2021, no significant change.  EKG 02/16/2021: Normal sinus rhythm at rate of 71 bpm, left atrial enlargement, left bundle branch block.  No further analysis.  No significant change from 10/22/2019.  Assessment     ICD-10-CM   1. Angina pectoris (HCC)  I20.9     2. Coronary artery disease of native artery of native heart with stable angina pectoris (HCC)  V25.366 Basic metabolic panel    CBC    3. Asymptomatic bilateral carotid artery stenosis  I65.23       No orders of  the defined types were placed in this encounter.   There are no discontinued medications.   Recommendations:   Amanda Lambert  is a 75 y.o. patient with long-standing history of LBBB, hypertension, asymptomatic bilateral carotid stenosis presents for follow-up of angina pectoris.  Cardiac catheterization on 11/04/2019 had revealed a mid LAD 50% stenosis.    I seen a month ago and started her on isosorbide for angina pectoris, in spite of this she continues to have significant amount of angina almost class III-IV.  She is already on aggressive medical therapy and her blood pressure is soft and hence unable to add a third agent for antianginal therapy.  Lipids are under excellent control.  Blood pressure is also well controlled.  Given her ongoing symptoms, I will schedule her for left heart catheterization with eye towards revascularization of the mid LAD.  All questions regarding angiography explained to the patient.  I will schedule it either under me or with Dr. Virgina Jock depending upon availability.    Adrian Prows, PA-C 06/06/2021, 1:53 PM Office: [phone]

## 2021-06-06 NOTE — H&P (View-Only) (Signed)
Primary Physician/Referring:  [name], MD  Patient ID: Amanda Lambert, female    DOB: July 16, 1946, 75 y.o.   MRN: [id number]  Chief Complaint  Patient presents with   Chest Pain    4 WEEKS   DOE   HPI:    Amanda Lambert  is a 75 y.o. Caucasian female patient with long-standing history of LBBB, hypertension, asymptomatic bilateral carotid stenosis presents for follow-up of angina pectoris.  Cardiac catheterization on 11/04/2019 had revealed a mid LAD 50% stenosis.    Patient was seen by me a month ago for worsening dyspnea and exertional angina pectoris.  She was started on isosorbide dinitrate, in spite of this and in spite of increasing the dose, she still continues to have severe symptoms of angina even doing routine activities around the house, she had to stop.  She has not had any rest pain or nocturnal angina.  No PND or orthopnea.  Tolerating all her medications well.  Past Medical History:  Diagnosis Date   Arthritis    right elbow   Biliary dyskinesia    Constipation    GERD (gastroesophageal reflux disease)    Hepatitis B 1975   Hypertension    Nausea    PONV (postoperative nausea and vomiting)    slow to wake up   [facility] spotted fever 04/01/2012   adm. to hospital   Syncope 10/17/2019   Weakness    Past Surgical History:  Procedure Laterality Date   APPENDECTOMY  1963   CHOLECYSTECTOMY  04/26/2012   Procedure: LAPAROSCOPIC CHOLECYSTECTOMY WITH INTRAOPERATIVE CHOLANGIOGRAM;  Surgeon: Merrie Roof, MD;  Location: [facility];  Service: General;  Laterality: N/A;   [place]   for miscarriage   DILATION AND CURETTAGE, DIAGNOSTIC / [place] CATH AND CORONARY ANGIOGRAPHY N/A 11/04/2019   Procedure: LEFT HEART CATH AND CORONARY ANGIOGRAPHY;  Surgeon: Adrian Prows, MD;  Location: [place] [facility];  Service: Cardiovascular;  Laterality: N/A;   TONSILLECTOMY  1964  - approximate   WRIST FRACTURE SURGERY  2008    right   Family History  Problem Relation Age of Onset   Cardiomyopathy Mother    Coronary artery disease Father    Hypertension Brother    Prostate cancer Brother    Social History   Tobacco Use   Smoking status: Never   Smokeless tobacco: Never  Substance Use Topics   Alcohol use: No   Marital Status: Widowed ROS  Review of Systems  Cardiovascular:  Positive for chest pain and dyspnea on exertion. Negative for claudication, leg swelling, orthopnea and palpitations.  Respiratory:  Negative for shortness of breath.   Musculoskeletal:  Positive for joint pain.  Gastrointestinal:  Negative for melena.  Neurological:  Negative for dizziness.  Objective  Blood pressure 118/71, pulse 78, temperature 98.5 F (36.9 C), temperature source Temporal, resp. rate 16, height 5' 7" (1.702 m), weight 196 lb 6.4 oz (89.1 kg), SpO2 98 %.  Vitals with BMI 06/06/2021 05/09/2021 02/16/2021  Height 5' 7" 5' 7" 5' 7"  Weight 196 lbs 6 oz 200 lbs 197 lbs 13 oz  BMI 30.75 69.45 03.88  Systolic 828 003 491  Diastolic 71 60 73  Pulse 78 69 85  Some encounter information is confidential and restricted. Go to Review Flowsheets activity to see all data.     Physical Exam Vitals reviewed.  Constitutional:      Comments: Well-built and  mildly obese in no acute distress.  Neck:     Vascular: No carotid bruit or JVD.  Cardiovascular:     Rate and Rhythm: Normal rate and regular rhythm.     Pulses: Normal pulses and intact distal pulses.          Carotid pulses are  on the right side with bruit.    Heart sounds: Normal heart sounds, S1 normal and S2 normal. No murmur heard.   No gallop.  Pulmonary:     Effort: Pulmonary effort is normal. No respiratory distress.     Breath sounds: Normal breath sounds. No wheezing, rhonchi or rales.  Musculoskeletal:     Right lower leg: No edema.     Left lower leg: No edema.   Laboratory examination:   Recent Labs    05/10/21 1143  NA 136  K 4.4  CL 93*   CO2 28  GLUCOSE 95  BUN 12  CREATININE 1.00  CALCIUM 9.6   CrCl cannot be calculated (Patient's most recent lab result is older than the maximum 21 days allowed.).  CMP Latest Ref Rng & Units 05/10/2021 10/17/2019 10/14/2019  Glucose 65 - 99 mg/dL 95 104(H) 93  BUN 8 - 27 mg/dL 12 13 13  Creatinine 0.57 - 1.00 mg/dL 1.00 1.04(H) 0.99  Sodium 134 - 144 mmol/L 136 141 141  Potassium 3.5 - 5.2 mmol/L 4.4 3.5 3.9  Chloride 96 - 106 mmol/L 93(L) 104 102  CO2 20 - 29 mmol/L 28 26 25  Calcium 8.7 - 10.3 mg/dL 9.6 9.6 9.7  Total Protein 6.0 - 8.5 g/dL - - 6.6  Total Bilirubin 0.0 - 1.2 mg/dL - - 0.5  Alkaline Phos 39 - 117 IU/L - - 104  AST 0 - 40 IU/L - - 15  ALT 0 - 32 IU/L - - 11   CBC Latest Ref Rng & Units 10/17/2019 10/14/2019 12/10/2018  WBC 4.0 - 10.5 K/uL 6.1 6.6 6.0  Hemoglobin 12.0 - 15.0 g/dL 13.7 12.8 12.6  Hematocrit 36.0 - 46.0 % 42.3 37.3 39.4  Platelets 150 - 400 K/uL 340 329 303   Lipid Panel     Component Value Date/Time   CHOL 142 12/14/2020 1506   TRIG 95 12/14/2020 1506   HDL 79 12/14/2020 1506   LDLCALC 46 12/14/2020 1506   HEMOGLOBIN A1C No results found for: HGBA1C, MPG TSH No results for input(s): TSH in the last 8760 hours.   External Labs:  Labs 01/25/2021:  Hb 11.88/HCT 35.3, platelets 273, normal indicis.  Serum glucose 106 mg, BUN 12, creatinine 1.08, EGFR 54 mL, potassium 3.8, CMP otherwise normal.  Total cholesterol 131, triglycerides 105, HDL 71, LDL 41. Allergies   Allergies  Allergen Reactions   Ace Inhibitors Anaphylaxis    Angioedema.   Bee Venom Anaphylaxis   Thorazine [Chlorpromazine] Anaphylaxis   Wasp Venom Anaphylaxis   Levaquin [Levofloxacin In D5w] Nausea And Vomiting    Stated by patient she could not handle levaquin even with antiemetics    Shrimp [Shellfish Allergy] Rash and Hives    "splotches"    Amlodipine     Other reaction(s): severe leg edema   Compazine [Prochlorperazine Edisylate] Other (See Comments)     Becomes anxious.   Elemental Sulfur Itching        Latex Rash      Medications Prior to Visit:   Outpatient Medications Prior to Visit  Medication Sig Dispense Refill   acetaminophen (TYLENOL) 500 MG tablet Take 1,000   mg by mouth every 6 (six) hours as needed (pain).     aspirin EC 81 MG tablet Take 1 tablet (81 mg total) by mouth daily. 90 tablet 3   Calcium Carb-Cholecalciferol (CALCIUM 600/VITAMIN D3) 600-800 MG-UNIT TABS Take 5 tablets by mouth daily.     EPINEPHrine 0.3 mg/0.3 mL IJ SOAJ injection Inject 0.3 mg into the muscle once as needed for anaphylaxis.     esomeprazole (NEXIUM) 40 MG capsule Take 40 mg by mouth daily at 12 noon.     furosemide (LASIX) 20 MG tablet TAKE 1 TABLET (20 MG TOTAL) BY MOUTH DAILY AS NEEDED FOR EDEMA. (Patient taking differently: Take 40 mg by mouth daily as needed for edema.) 90 tablet 3   gabapentin (NEURONTIN) 300 MG capsule Take 1 capsule by mouth daily.     isosorbide dinitrate (ISORDIL) 20 MG tablet Take 1 tablet (20 mg total) by mouth 3 (three) times daily. (Patient taking differently: Take 30 mg by mouth 3 (three) times daily.) 270 tablet 3   labetalol (NORMODYNE) 200 MG tablet TAKE 1 TABLET BY MOUTH TWICE A DAY (Patient taking differently: 100MG TWICE DAILY) 180 tablet 1   meloxicam (MOBIC) 15 MG tablet Take 1 tablet (15 mg total) by mouth daily. 30 tablet 3   nitroGLYCERIN (NITROSTAT) 0.4 MG SL tablet Place 1 tablet (0.4 mg total) under the tongue every 5 (five) minutes as needed for up to 25 days for chest pain. 25 tablet 3   rosuvastatin (CRESTOR) 10 MG tablet TAKE 1 TABLET BY MOUTH EVERY DAY (Patient taking differently: Take 10 mg by mouth daily.) 90 tablet 3   Thiamine HCl (VITAMIN B-1 PO) Take 1 tablet by mouth daily.      triamterene-hydrochlorothiazide (MAXZIDE-25) 37.5-25 MG tablet Take 1 tablet by mouth daily.      No facility-administered medications prior to visit.     Final Medications at End of Visit    Current Meds   Medication Sig   acetaminophen (TYLENOL) 500 MG tablet Take 1,000 mg by mouth every 6 (six) hours as needed (pain).   aspirin EC 81 MG tablet Take 1 tablet (81 mg total) by mouth daily.   Calcium Carb-Cholecalciferol (CALCIUM 600/VITAMIN D3) 600-800 MG-UNIT TABS Take 5 tablets by mouth daily.   EPINEPHrine 0.3 mg/0.3 mL IJ SOAJ injection Inject 0.3 mg into the muscle once as needed for anaphylaxis.   esomeprazole (NEXIUM) 40 MG capsule Take 40 mg by mouth daily at 12 noon.   furosemide (LASIX) 20 MG tablet TAKE 1 TABLET (20 MG TOTAL) BY MOUTH DAILY AS NEEDED FOR EDEMA. (Patient taking differently: Take 40 mg by mouth daily as needed for edema.)   gabapentin (NEURONTIN) 300 MG capsule Take 1 capsule by mouth daily.   isosorbide dinitrate (ISORDIL) 20 MG tablet Take 1 tablet (20 mg total) by mouth 3 (three) times daily. (Patient taking differently: Take 30 mg by mouth 3 (three) times daily.)   labetalol (NORMODYNE) 200 MG tablet TAKE 1 TABLET BY MOUTH TWICE A DAY (Patient taking differently: 100MG TWICE DAILY)   meloxicam (MOBIC) 15 MG tablet Take 1 tablet (15 mg total) by mouth daily.   nitroGLYCERIN (NITROSTAT) 0.4 MG SL tablet Place 1 tablet (0.4 mg total) under the tongue every 5 (five) minutes as needed for up to 25 days for chest pain.   rosuvastatin (CRESTOR) 10 MG tablet TAKE 1 TABLET BY MOUTH EVERY DAY (Patient taking differently: Take 10 mg by mouth daily.)   Thiamine HCl (VITAMIN B-1 PO)   Take 1 tablet by mouth daily.    triamterene-hydrochlorothiazide (MAXZIDE-25) 37.5-25 MG tablet Take 1 tablet by mouth daily.     Radiology:  No results found.  Cardiac Studies:   Echocardiogram 10/15/2019: Left ventricle cavity is normal in size. Moderate concentric hypertrophy of the left ventricle. Normal global wall motion. Normal LV systolic function with visual EF 50-55%. Doppler evidence of grade I (impaired) diastolic dysfunction, normal LAP. Left atrial cavity is mildly dilated. Moderate  (Grade II) mitral regurgitation. Structurally normal tricuspid valve.  Mild to moderate tricuspid regurgitation. No evidence of pulmonary hypertension.  Lexiscan (Walking with mod Bruce)Tetrofosmin Stress Test  10/20/2019: Nondiagnostic ECG stress. Mild degree large extent fixed perfusion defect located in the mid anteroseptal wall, mid inferoseptal wall, basal anteroseptal wall and basal inferoseptal wall. This is consistent with LBBB, mild ischemia cannot be completely excluded.  All segments of left ventricle demonstrated normal wall motion and thickening. Stress LV EF is normal 61%.  Low risk. No previous exam available for comparison.   Left Heart Catheterization 11/04/19:  Mid LAD lesion is 50% stenosed. Otherwise normal coronary arteries LV end diastolic pressure is normal. Medical therapy for CAD.     MID LAD HAZY STENOSIS  Carotid artery duplex 11/03/2020: Doppler velocity suggests stenosis in the right internal carotid artery (50-69%). Doppler velocity suggests stenosis in the left internal carotid artery (16-49%). There is moderate amount of homogenous plaque bilaterally. Antegrade right vertebral artery flow. Antegrade left vertebral artery flow. Follow up in six months is appropriate if clinically indicated. No significant change from 04/06/2020.   EKG   EKG 05/09/2021: Sinus rhythm at a rate of 69 bpm.  Left atrial enlargement.  Left bundle branch block, no further analysis.  Compared to EKG 02/16/2021, no significant change.  EKG 02/16/2021: Normal sinus rhythm at rate of 71 bpm, left atrial enlargement, left bundle branch block.  No further analysis.  No significant change from 10/22/2019.  Assessment     ICD-10-CM   1. Angina pectoris (HCC)  I20.9     2. Coronary artery disease of native artery of native heart with stable angina pectoris (HCC)  I25.118 Basic metabolic panel    CBC    3. Asymptomatic bilateral carotid artery stenosis  I65.23       No orders of  the defined types were placed in this encounter.   There are no discontinued medications.   Recommendations:   Amanda Lambert  is a 75 y.o. patient with long-standing history of LBBB, hypertension, asymptomatic bilateral carotid stenosis presents for follow-up of angina pectoris.  Cardiac catheterization on 11/04/2019 had revealed a mid LAD 50% stenosis.    I seen a month ago and started her on isosorbide for angina pectoris, in spite of this she continues to have significant amount of angina almost class III-IV.  She is already on aggressive medical therapy and her blood pressure is soft and hence unable to add a third agent for antianginal therapy.  Lipids are under excellent control.  Blood pressure is also well controlled.  Given her ongoing symptoms, I will schedule her for left heart catheterization with eye towards revascularization of the mid LAD.  All questions regarding angiography explained to the patient.  I will schedule it either under me or with Dr. Patwardhan depending upon availability.     , PA-C 06/06/2021, 1:53 PM Office: [phone] 

## 2021-06-07 ENCOUNTER — Ambulatory Visit: Payer: Medicare Other

## 2021-06-07 DIAGNOSIS — I6523 Occlusion and stenosis of bilateral carotid arteries: Secondary | ICD-10-CM

## 2021-06-07 DIAGNOSIS — I25118 Atherosclerotic heart disease of native coronary artery with other forms of angina pectoris: Secondary | ICD-10-CM | POA: Diagnosis not present

## 2021-06-08 LAB — CBC
Hematocrit: 36.7 % (ref 34.0–46.6)
Hemoglobin: 11.8 g/dL (ref 11.1–15.9)
MCH: 27.6 pg (ref 26.6–33.0)
MCHC: 32.2 g/dL (ref 31.5–35.7)
MCV: 86 fL (ref 79–97)
Platelets: 337 10*3/uL (ref 150–450)
RBC: 4.27 x10E6/uL (ref 3.77–5.28)
RDW: 11.9 % (ref 11.7–15.4)
WBC: 6.8 10*3/uL (ref 3.4–10.8)

## 2021-06-08 LAB — BASIC METABOLIC PANEL
BUN/Creatinine Ratio: 14 (ref 12–28)
BUN: 15 mg/dL (ref 8–27)
CO2: 30 mmol/L — ABNORMAL HIGH (ref 20–29)
Calcium: 10.3 mg/dL (ref 8.7–10.3)
Chloride: 97 mmol/L (ref 96–106)
Creatinine, Ser: 1.09 mg/dL — ABNORMAL HIGH (ref 0.57–1.00)
Glucose: 102 mg/dL — ABNORMAL HIGH (ref 65–99)
Potassium: 3.8 mmol/L (ref 3.5–5.2)
Sodium: 143 mmol/L (ref 134–144)
eGFR: 53 mL/min/{1.73_m2} — ABNORMAL LOW (ref >59)

## 2021-06-13 DIAGNOSIS — I2510 Atherosclerotic heart disease of native coronary artery without angina pectoris: Secondary | ICD-10-CM

## 2021-06-14 ENCOUNTER — Ambulatory Visit (HOSPITAL_COMMUNITY)
Admission: RE | Admit: 2021-06-14 | Discharge: 2021-06-14 | Disposition: A | Discharge status: Home / Self Care | Payer: Medicare Other | Attending: Cardiology | Admitting: Cardiology

## 2021-06-14 ENCOUNTER — Encounter (HOSPITAL_COMMUNITY)
Admission: RE | Disposition: A | Discharge status: Home / Self Care | Payer: Self-pay | Source: Home / Self Care | Attending: Cardiology

## 2021-06-14 ENCOUNTER — Other Ambulatory Visit (HOSPITAL_COMMUNITY): Payer: Self-pay

## 2021-06-14 DIAGNOSIS — I2510 Atherosclerotic heart disease of native coronary artery without angina pectoris: Secondary | ICD-10-CM

## 2021-06-14 DIAGNOSIS — I25118 Atherosclerotic heart disease of native coronary artery with other forms of angina pectoris: Secondary | ICD-10-CM | POA: Diagnosis not present

## 2021-06-14 DIAGNOSIS — I209 Angina pectoris, unspecified: Secondary | ICD-10-CM | POA: Diagnosis not present

## 2021-06-14 DIAGNOSIS — Z955 Presence of coronary angioplasty implant and graft: Secondary | ICD-10-CM | POA: Diagnosis not present

## 2021-06-14 DIAGNOSIS — I200 Unstable angina: Secondary | ICD-10-CM | POA: Diagnosis present

## 2021-06-14 DIAGNOSIS — I25119 Atherosclerotic heart disease of native coronary artery with unspecified angina pectoris: Secondary | ICD-10-CM | POA: Diagnosis not present

## 2021-06-14 HISTORY — PX: CORONARY STENT INTERVENTION: CATH118234

## 2021-06-14 HISTORY — DX: Atherosclerotic heart disease of native coronary artery without angina pectoris: I25.10

## 2021-06-14 HISTORY — PX: CORONARY IMAGING/OCT: CATH118326

## 2021-06-14 HISTORY — PX: LEFT HEART CATH AND CORONARY ANGIOGRAPHY: CATH118249

## 2021-06-14 HISTORY — PX: INTRAVASCULAR IMAGING/OCT: CATH118326

## 2021-06-14 SURGERY — LEFT HEART CATH AND CORONARY ANGIOGRAPHY
Anesthesia: LOCAL

## 2021-06-14 MED ORDER — IOHEXOL 350 MG/ML SOLN
INTRAVENOUS | Status: DC | PRN
  Administered 2021-06-14: 140 mL

## 2021-06-14 MED ORDER — SODIUM CHLORIDE 0.9% FLUSH: 3.0000 mL | Freq: Two times a day (BID) | INTRAVENOUS | Status: DC

## 2021-06-14 MED ORDER — HEPARIN SODIUM (PORCINE) 1000 UNIT/ML IJ SOLN
INTRAMUSCULAR | Status: AC
  Filled 2021-06-14: qty 1

## 2021-06-14 MED ORDER — HEPARIN (PORCINE) IN NACL 1000-0.9 UT/500ML-% IV SOLN
INTRAVENOUS | Status: AC
  Filled 2021-06-14: qty 1000

## 2021-06-14 MED ORDER — MIDAZOLAM HCL 2 MG/2ML IJ SOLN
INTRAMUSCULAR | Status: DC | PRN
  Administered 2021-06-14: 2 mg via INTRAVENOUS

## 2021-06-14 MED ORDER — LIDOCAINE HCL (PF) 1 % IJ SOLN
INTRAMUSCULAR | Status: AC
  Filled 2021-06-14: qty 30

## 2021-06-14 MED ORDER — HEPARIN (PORCINE) IN NACL 1000-0.9 UT/500ML-% IV SOLN
INTRAVENOUS | Status: DC | PRN
  Administered 2021-06-14 (×2): 500 mL

## 2021-06-14 MED ORDER — SODIUM CHLORIDE 0.9% FLUSH: 3.0000 mL | INTRAVENOUS | Status: DC | PRN

## 2021-06-14 MED ORDER — SODIUM CHLORIDE 0.9 % WEIGHT BASED INFUSION
3.0000 mL/kg/h | INTRAVENOUS | Status: DC
  Administered 2021-06-14: 3 mL/kg/h via INTRAVENOUS

## 2021-06-14 MED ORDER — HYDRALAZINE HCL 20 MG/ML IJ SOLN: 5.0000 mg | INTRAMUSCULAR | Status: DC | PRN

## 2021-06-14 MED ORDER — SODIUM CHLORIDE 0.9 % WEIGHT BASED INFUSION: 1.0000 mL/kg/h | INTRAVENOUS | Status: DC

## 2021-06-14 MED ORDER — MIDAZOLAM HCL 2 MG/2ML IJ SOLN
INTRAMUSCULAR | Status: AC
  Filled 2021-06-14: qty 2

## 2021-06-14 MED ORDER — ASPIRIN 81 MG PO CHEW: 81.0000 mg | CHEWABLE_TABLET | ORAL | Status: DC

## 2021-06-14 MED ORDER — LIDOCAINE HCL (PF) 1 % IJ SOLN
INTRAMUSCULAR | Status: DC | PRN
  Administered 2021-06-14: 2 mL

## 2021-06-14 MED ORDER — VERAPAMIL HCL 2.5 MG/ML IV SOLN
INTRAVENOUS | Status: AC
  Filled 2021-06-14: qty 2

## 2021-06-14 MED ORDER — ONDANSETRON HCL 4 MG/2ML IJ SOLN: 4.0000 mg | Freq: Four times a day (QID) | INTRAMUSCULAR | Status: DC | PRN

## 2021-06-14 MED ORDER — FENTANYL CITRATE (PF) 100 MCG/2ML IJ SOLN
INTRAMUSCULAR | Status: DC | PRN
  Administered 2021-06-14: 25 ug via INTRAVENOUS

## 2021-06-14 MED ORDER — ACETAMINOPHEN 325 MG PO TABS
650.0000 mg | ORAL_TABLET | ORAL | Status: DC | PRN
  Administered 2021-06-14: 650 mg via ORAL
  Filled 2021-06-14: qty 2

## 2021-06-14 MED ORDER — SODIUM CHLORIDE 0.9 % IV SOLN: 250.0000 mL | INTRAVENOUS | Status: DC | PRN

## 2021-06-14 MED ORDER — VERAPAMIL HCL 2.5 MG/ML IV SOLN
INTRAVENOUS | Status: DC | PRN
  Administered 2021-06-14: 10 mL via INTRA_ARTERIAL

## 2021-06-14 MED ORDER — HEPARIN SODIUM (PORCINE) 1000 UNIT/ML IJ SOLN
INTRAMUSCULAR | Status: DC | PRN
  Administered 2021-06-14: 2000 [IU] via INTRAVENOUS
  Administered 2021-06-14: 6000 [IU] via INTRAVENOUS
  Administered 2021-06-14: 3000 [IU] via INTRAVENOUS

## 2021-06-14 MED ORDER — NITROGLYCERIN 1 MG/10 ML FOR IR/CATH LAB
INTRA_ARTERIAL | Status: DC | PRN
  Administered 2021-06-14: 200 ug via INTRACORONARY

## 2021-06-14 MED ORDER — PANTOPRAZOLE SODIUM 40 MG PO TBEC: 40.0000 mg | DELAYED_RELEASE_TABLET | Freq: Every day | ORAL | 1 refills | Status: DC

## 2021-06-14 MED ORDER — CLOPIDOGREL BISULFATE 300 MG PO TABS
ORAL_TABLET | ORAL | Status: DC | PRN
  Administered 2021-06-14: 600 mg via ORAL

## 2021-06-14 MED ORDER — NITROGLYCERIN 1 MG/10 ML FOR IR/CATH LAB
INTRA_ARTERIAL | Status: AC
  Filled 2021-06-14: qty 10

## 2021-06-14 MED ORDER — FENTANYL CITRATE (PF) 100 MCG/2ML IJ SOLN
INTRAMUSCULAR | Status: AC
  Filled 2021-06-14: qty 2

## 2021-06-14 MED ORDER — CLOPIDOGREL BISULFATE 75 MG PO TABS
75.0000 mg | ORAL_TABLET | Freq: Every day | ORAL | 0 refills | Status: DC
  Filled 2021-06-14: qty 30, 30d supply, fill #0

## 2021-06-14 SURGICAL SUPPLY — 16 items
BALLN EUPHORA RX 3.0X30 (BALLOONS) ×2
BALLOON EUPHORA RX 3.0X30 (BALLOONS) IMPLANT
CATH DRAGONFLY OPSTAR (CATHETERS) ×1 IMPLANT
CATH OPTITORQUE TIG 4.0 5F (CATHETERS) ×1 IMPLANT
CATH VISTA GUIDE 6FR XB3.5 (CATHETERS) ×1 IMPLANT
DEVICE RAD COMP TR BAND LRG (VASCULAR PRODUCTS) ×1 IMPLANT
GLIDESHEATH SLEND A-KIT 6F 22G (SHEATH) ×1 IMPLANT
GUIDEWIRE INQWIRE 1.5J.035X260 (WIRE) IMPLANT
INQWIRE 1.5J .035X260CM (WIRE) ×2
KIT ENCORE 26 ADVANTAGE (KITS) ×1 IMPLANT
KIT HEART LEFT (KITS) ×2 IMPLANT
PACK CARDIAC CATHETERIZATION (CUSTOM PROCEDURE TRAY) ×2 IMPLANT
STENT ONYX FRONTIER 3.0X30 (Permanent Stent) ×1 IMPLANT
TRANSDUCER W/STOPCOCK (MISCELLANEOUS) ×2 IMPLANT
TUBING CIL FLEX 10 FLL-RA (TUBING) ×2 IMPLANT
WIRE ASAHI PROWATER 180CM (WIRE) ×1 IMPLANT

## 2021-06-14 NOTE — Interval H&P Note (Signed)
History and Physical Interval Note:  06/14/2021 3:13 PM  Amanda Lambert  has presented today for surgery, with the diagnosis of cad.  The various methods of treatment have been discussed with the patient and family. After consideration of risks, benefits and other options for treatment, the patient has consented to  Procedure(s): LEFT HEART CATH AND CORONARY ANGIOGRAPHY (N/A) and possible angioplasty as a surgical intervention.  The patient's history has been reviewed, patient examined, no change in status, stable for surgery.  I have reviewed the patient's chart and labs.  Questions were answered to the patient's satisfaction.   Cath Lab Visit (complete for each Cath Lab visit)  Clinical Evaluation Leading to the Procedure:   ACS: No.  Non-ACS:    Anginal Classification: CCS IV  Anti-ischemic medical therapy: Maximal Therapy (2 or more classes of medications)  Non-Invasive Test Results: No non-invasive testing performed  Prior CABG: No previous CABG     Adrian Prows

## 2021-06-15 ENCOUNTER — Encounter (HOSPITAL_COMMUNITY): Payer: Self-pay | Admitting: Cardiology

## 2021-06-15 LAB — POCT ACTIVATED CLOTTING TIME
Activated Clotting Time: 242 seconds
Activated Clotting Time: 277 seconds

## 2021-06-18 ENCOUNTER — Telehealth (HOSPITAL_COMMUNITY): Payer: Self-pay

## 2021-06-21 ENCOUNTER — Telehealth (HOSPITAL_COMMUNITY): Payer: Self-pay | Admitting: *Deleted

## 2021-06-21 NOTE — Telephone Encounter (Signed)
Completed follow up call regarding Cardiac Rehab Phase II.  Pt declines to participate as she is very active in and outside of her home. Yoshiko thanked me for my call. Cherre Huger, BSN Cardiac and Training and development officer

## 2021-06-29 DIAGNOSIS — E7800 Pure hypercholesterolemia, unspecified: Secondary | ICD-10-CM

## 2021-06-29 DIAGNOSIS — I25118 Atherosclerotic heart disease of native coronary artery with other forms of angina pectoris: Secondary | ICD-10-CM

## 2021-06-29 DIAGNOSIS — I10 Essential (primary) hypertension: Secondary | ICD-10-CM

## 2021-06-29 NOTE — Telephone Encounter (Signed)
ICD-10-CM   1. Coronary artery disease of native artery of native heart with stable angina pectoris (HCC)  I25.118 CBC    CMP14+EGFR    Lipid Panel With LDL/HDL Ratio    2. Primary hypertension  I10 TSH    3. Hypercholesteremia  E78.00 Lipid Panel With LDL/HDL Ratio     Orders Placed This Encounter  Procedures   CBC   CMP14+EGFR   Lipid Panel With LDL/HDL Ratio   TSH     Adrian Prows, MD, [facility] 06/29/2021, 12:52 PM Office: [phone] Fax: [phone] Pager: [phone]

## 2021-06-29 NOTE — Telephone Encounter (Signed)
From patient.

## 2021-06-30 DIAGNOSIS — I25118 Atherosclerotic heart disease of native coronary artery with other forms of angina pectoris: Secondary | ICD-10-CM | POA: Diagnosis not present

## 2021-06-30 DIAGNOSIS — E7800 Pure hypercholesterolemia, unspecified: Secondary | ICD-10-CM | POA: Diagnosis not present

## 2021-06-30 DIAGNOSIS — I10 Essential (primary) hypertension: Secondary | ICD-10-CM | POA: Diagnosis not present

## 2021-07-01 LAB — CMP14+EGFR
ALT: 10 IU/L (ref 0–32)
AST: 18 IU/L (ref 0–40)
Albumin/Globulin Ratio: 1.7 (ref 1.2–2.2)
Albumin: 4.4 g/dL (ref 3.7–4.7)
Alkaline Phosphatase: 98 IU/L (ref 44–121)
BUN/Creatinine Ratio: 14 (ref 12–28)
BUN: 16 mg/dL (ref 8–27)
Bilirubin Total: 0.6 mg/dL (ref 0.0–1.2)
CO2: 28 mmol/L (ref 20–29)
Calcium: 10.3 mg/dL (ref 8.7–10.3)
Chloride: 93 mmol/L — ABNORMAL LOW (ref 96–106)
Creatinine, Ser: 1.12 mg/dL — ABNORMAL HIGH (ref 0.57–1.00)
Globulin, Total: 2.6 g/dL (ref 1.5–4.5)
Glucose: 113 mg/dL — ABNORMAL HIGH (ref 70–99)
Potassium: 3.7 mmol/L (ref 3.5–5.2)
Sodium: 136 mmol/L (ref 134–144)
Total Protein: 7 g/dL (ref 6.0–8.5)
eGFR: 52 mL/min/{1.73_m2} — ABNORMAL LOW (ref >59)

## 2021-07-01 LAB — CBC
Hematocrit: 36.8 % (ref 34.0–46.6)
Hemoglobin: 12 g/dL (ref 11.1–15.9)
MCH: 27.9 pg (ref 26.6–33.0)
MCHC: 32.6 g/dL (ref 31.5–35.7)
MCV: 86 fL (ref 79–97)
Platelets: 357 10*3/uL (ref 150–450)
RBC: 4.3 x10E6/uL (ref 3.77–5.28)
RDW: 12.3 % (ref 11.7–15.4)
WBC: 6.3 10*3/uL (ref 3.4–10.8)

## 2021-07-01 LAB — LIPID PANEL WITH LDL/HDL RATIO
Cholesterol, Total: 166 mg/dL (ref 100–199)
HDL: 94 mg/dL (ref >39)
LDL Chol Calc (NIH): 55 mg/dL (ref 0–99)
LDL/HDL Ratio: 0.6 ratio (ref 0.0–3.2)
Triglycerides: 96 mg/dL (ref 0–149)
VLDL Cholesterol Cal: 17 mg/dL (ref 5–40)

## 2021-07-01 LAB — TSH: TSH: 1.54 u[IU]/mL (ref 0.450–4.500)

## 2021-07-04 ENCOUNTER — Ambulatory Visit: Payer: Medicare Other | Admitting: Cardiology

## 2021-07-04 ENCOUNTER — Other Ambulatory Visit (HOSPITAL_COMMUNITY): Payer: Self-pay

## 2021-07-04 ENCOUNTER — Other Ambulatory Visit: Payer: Self-pay

## 2021-07-04 ENCOUNTER — Telehealth (HOSPITAL_COMMUNITY): Payer: Self-pay

## 2021-07-04 ENCOUNTER — Encounter: Payer: Self-pay | Admitting: Cardiology

## 2021-07-04 VITALS — BP 120/71 | HR 78 | Temp 97.3°F | Resp 16 | Ht 67.0 in | Wt 199.0 lb

## 2021-07-04 DIAGNOSIS — E7800 Pure hypercholesterolemia, unspecified: Secondary | ICD-10-CM

## 2021-07-04 DIAGNOSIS — I25118 Atherosclerotic heart disease of native coronary artery with other forms of angina pectoris: Secondary | ICD-10-CM | POA: Diagnosis not present

## 2021-07-04 DIAGNOSIS — I10 Essential (primary) hypertension: Secondary | ICD-10-CM

## 2021-07-04 NOTE — H&P (View-Only) (Signed)
Primary Physician/Referring:  [name], MD  Patient ID: Amanda Lambert, female    DOB: [birth year], 75 y.o.   MRN: [id number]  No chief complaint on file.  HPI:    Amanda Lambert  is a 75 y.o. Caucasian female patient with long-standing history of LBBB, hypertension, asymptomatic bilateral carotid stenosis presents for follow-up of angina pectoris.  Cardiac catheterization on 11/04/2019 had revealed a mid LAD 50% stenosis.    She underwent OCT guided PCI on 06/14/2021, felt well for a week but has had recurrence of class III angina pectoris and dyspnea on exertion and has again increased nitrate dose.    Past Medical History:  Diagnosis Date   Arthritis    right elbow   Biliary dyskinesia    Constipation    GERD (gastroesophageal reflux disease)    Hepatitis B 1975   Hypertension    Nausea    PONV (postoperative nausea and vomiting)    slow to wake up   [facility] spotted fever 04/01/2012   adm. to hospital   Syncope 10/17/2019   Weakness    Past Surgical History:  Procedure Laterality Date   APPENDECTOMY  1963   CHOLECYSTECTOMY  04/26/2012   Procedure: LAPAROSCOPIC CHOLECYSTECTOMY WITH INTRAOPERATIVE CHOLANGIOGRAM;  Surgeon: Merrie Roof, MD;  Location: [facility];  Service: General;  Laterality: N/A;   CORONARY STENT INTERVENTION N/A 06/14/2021   Procedure: CORONARY STENT INTERVENTION;  Surgeon: Adrian Prows, MD;  Location: [place] [facility];  Service: Cardiovascular;  Laterality: N/A;   DILATION AND CURETTAGE OF UTERUS  1982   for miscarriage   DILATION AND CURETTAGE, DIAGNOSTIC / THERAPEUTIC  1972   INTRAVASCULAR IMAGING/OCT N/A 06/14/2021   Procedure: INTRAVASCULAR IMAGING/OCT;  Surgeon: Adrian Prows, MD;  Location: [place] [facility];  Service: Cardiovascular;  Laterality: N/A;   LEFT HEART CATH AND CORONARY ANGIOGRAPHY N/A 11/04/2019   Procedure: LEFT HEART CATH AND CORONARY ANGIOGRAPHY;  Surgeon: Adrian Prows, MD;  Location: [place] [facility];  Service: Cardiovascular;   Laterality: N/A;   LEFT HEART CATH AND CORONARY ANGIOGRAPHY N/A 06/14/2021   Procedure: LEFT HEART CATH AND CORONARY ANGIOGRAPHY;  Surgeon: Adrian Prows, MD;  Location: [place] [facility];  Service: Cardiovascular;  Laterality: N/A;   TONSILLECTOMY  1964  - approximate   WRIST FRACTURE SURGERY  2008   right   Family History  Problem Relation Age of Onset   Cardiomyopathy Mother    Coronary artery disease Father    Hypertension Brother    Prostate cancer Brother    Social History   Tobacco Use   Smoking status: Never   Smokeless tobacco: Never  Substance Use Topics   Alcohol use: No   Marital Status: Widowed ROS  Review of Systems  Cardiovascular:  Positive for chest pain and dyspnea on exertion. Negative for claudication, leg swelling, orthopnea and palpitations.  Respiratory:  Negative for shortness of breath.   Musculoskeletal:  Positive for joint pain.  Gastrointestinal:  Negative for melena.  Neurological:  Negative for dizziness.  Objective  Blood pressure 120/71, pulse 78, temperature (!) 97.3 F (36.3 C), temperature source Temporal, resp. rate 16, height _0  (1.702 m), weight 199 lb (90.3 kg), SpO2 98 %.  Vitals with BMI 07/04/2021 06/14/2021 06/14/2021  Height _1  - -  Weight 199 lbs - -  BMI 01.65 - -  Systolic 537 482 707  Diastolic 71 42 48  Pulse 78 64 65  Some encounter information is confidential  and restricted. Go to Review Flowsheets activity to see all data.     Physical Exam Vitals reviewed.  Constitutional:      Comments: Well-built and mildly obese in no acute distress.  Neck:     Vascular: No carotid bruit or JVD.  Cardiovascular:     Rate and Rhythm: Normal rate and regular rhythm.     Pulses: Normal pulses and intact distal pulses.          Carotid pulses are  on the right side with bruit.    Heart sounds: Normal heart sounds, S1 normal and S2 normal. No murmur heard.   No gallop.  Pulmonary:     Effort: Pulmonary effort is normal. No  respiratory distress.     Breath sounds: Normal breath sounds. No wheezing, rhonchi or rales.  Musculoskeletal:     Right lower leg: No edema.     Left lower leg: No edema.   Laboratory examination:   Recent Labs    05/10/21 1143 06/07/21 1104 06/30/21 0854  NA 136 143 136  K 4.4 3.8 3.7  CL 93* 97 93*  CO2 28 30* 28  GLUCOSE 95 102* 113*  BUN _0 CREATININE 1.00 1.09* 1.12*  CALCIUM 9.6 10.3 10.3   estimated creatinine clearance is 50.9 mL/min (A) (by C-G formula based on SCr of 1.12 mg/dL (H)).  CMP Latest Ref Rng & Units 06/30/2021 06/07/2021 05/10/2021  Glucose 70 - 99 mg/dL 113(H) 102(H) 95  BUN 8 - 27 mg/dL _1 Creatinine 0.57 - 1.00 mg/dL 1.12(H) 1.09(H) 1.00  Sodium 134 - 144 mmol/L 136 143 136  Potassium 3.5 - 5.2 mmol/L 3.7 3.8 4.4  Chloride 96 - 106 mmol/L 93(L) 97 93(L)  CO2 20 - 29 mmol/L 28 30(H) 28  Calcium 8.7 - 10.3 mg/dL 10.3 10.3 9.6  Total Protein 6.0 - 8.5 g/dL 7.0 - -  Total Bilirubin 0.0 - 1.2 mg/dL 0.6 - -  Alkaline Phos 44 - 121 IU/L 98 - -  AST 0 - 40 IU/L 18 - -  ALT 0 - 32 IU/L 10 - -   CBC Latest Ref Rng & Units 06/30/2021 06/07/2021 10/17/2019  WBC 3.4 - 10.8 x10E3/uL 6.3 6.8 6.1  Hemoglobin 11.1 - 15.9 g/dL 12.0 11.8 13.7  Hematocrit 34.0 - 46.6 % 36.8 36.7 42.3  Platelets 150 - 450 x10E3/uL 357 337 340   Lipid Panel     Component Value Date/Time   CHOL 166 06/30/2021 0854   TRIG 96 06/30/2021 0854   HDL 94 06/30/2021 0854   LDLCALC 55 06/30/2021 0854   HEMOGLOBIN A1C No results found for: HGBA1C, MPG TSH Recent Labs    06/30/21 0854  TSH 1.540     External Labs:  Labs 01/25/2021:  Hb 11.88/HCT 35.3, platelets 273, normal indicis.  Serum glucose 106 mg, BUN 12, creatinine 1.08, EGFR 54 mL, potassium 3.8, CMP otherwise normal.  Total cholesterol 131, triglycerides 105, HDL 71, LDL 41. Allergies   Allergies  Allergen Reactions   Ace Inhibitors Anaphylaxis    Angioedema.   Bee Venom Anaphylaxis   Thorazine  [Chlorpromazine] Anaphylaxis   Wasp Venom Anaphylaxis   Levaquin [Levofloxacin In D5w] Nausea And Vomiting    Stated by patient she could not handle levaquin even with antiemetics    Shrimp [Shellfish Allergy] Rash and Hives    "splotches"    Amlodipine     severe leg edema   Elemental Sulfur Itching  Compazine [Prochlorperazine Edisylate] Anxiety   Latex Rash      Medications Prior to Visit:   Outpatient Medications Prior to Visit  Medication Sig Dispense Refill   aspirin EC 81 MG tablet Take 1 tablet (81 mg total) by mouth daily. 90 tablet 3   b complex vitamins capsule Take 1 capsule by mouth 4 (four) times a week.     clopidogrel (PLAVIX) 75 MG tablet Take 1 tablet (75 mg total) by mouth daily. 30 tablet 0   EPINEPHrine 0.3 mg/0.3 mL IJ SOAJ injection Inject 0.3 mg into the muscle once as needed for anaphylaxis.     furosemide (LASIX) 20 MG tablet TAKE 1 TABLET (20 MG TOTAL) BY MOUTH DAILY AS NEEDED FOR EDEMA. (Patient taking differently: Take 40 mg by mouth daily.) 90 tablet 3   gabapentin (NEURONTIN) 300 MG capsule Take 1 capsule by mouth at bedtime as needed (pain).     isosorbide dinitrate (ISORDIL) 20 MG tablet Take 1 tablet (20 mg total) by mouth 3 (three) times daily. (Patient taking differently: Take 40 mg by mouth 3 (three) times daily. Take with 10 mg to equal 30 mg 3 times daily) 270 tablet 3   labetalol (NORMODYNE) 200 MG tablet TAKE 1 TABLET BY MOUTH TWICE A DAY (Patient taking differently: Take 100 mg by mouth 2 (two) times daily.) 180 tablet 1   naproxen sodium (ALEVE) 220 MG tablet Take 440 mg by mouth daily as needed (pain).     nitroGLYCERIN (NITROSTAT) 0.4 MG SL tablet Place 1 tablet (0.4 mg total) under the tongue every 5 (five) minutes as needed for up to 25 days for chest pain. 25 tablet 3   pantoprazole (PROTONIX) 40 MG tablet Take 1 tablet (40 mg total) by mouth daily. 90 tablet 1   rosuvastatin (CRESTOR) 10 MG tablet TAKE 1 TABLET BY MOUTH EVERY DAY  (Patient taking differently: Take 10 mg by mouth at bedtime.) 90 tablet 3   sodium chloride (OCEAN) 0.65 % SOLN nasal spray Place 1 spray into both nostrils as needed for congestion.     triamterene-hydrochlorothiazide (MAXZIDE-25) 37.5-25 MG tablet Take 1 tablet by mouth daily.      VITAMIN D PO Take 1 capsule by mouth 3 (three) times a week.     isosorbide dinitrate (ISORDIL) 10 MG tablet Take 10 mg by mouth 3 (three) times daily. Take with 20 mg to equal 30 mg 3 times daily     No facility-administered medications prior to visit.     Final Medications at End of Visit    Current Meds  Medication Sig   aspirin EC 81 MG tablet Take 1 tablet (81 mg total) by mouth daily.   b complex vitamins capsule Take 1 capsule by mouth 4 (four) times a week.   clopidogrel (PLAVIX) 75 MG tablet Take 1 tablet (75 mg total) by mouth daily.   EPINEPHrine 0.3 mg/0.3 mL IJ SOAJ injection Inject 0.3 mg into the muscle once as needed for anaphylaxis.   furosemide (LASIX) 20 MG tablet TAKE 1 TABLET (20 MG TOTAL) BY MOUTH DAILY AS NEEDED FOR EDEMA. (Patient taking differently: Take 40 mg by mouth daily.)   gabapentin (NEURONTIN) 300 MG capsule Take 1 capsule by mouth at bedtime as needed (pain).   isosorbide dinitrate (ISORDIL) 20 MG tablet Take 1 tablet (20 mg total) by mouth 3 (three) times daily. (Patient taking differently: Take 40 mg by mouth 3 (three) times daily. Take with 10 mg to equal 30 mg 3  times daily)   labetalol (NORMODYNE) 200 MG tablet TAKE 1 TABLET BY MOUTH TWICE A DAY (Patient taking differently: Take 100 mg by mouth 2 (two) times daily.)   naproxen sodium (ALEVE) 220 MG tablet Take 440 mg by mouth daily as needed (pain).   nitroGLYCERIN (NITROSTAT) 0.4 MG SL tablet Place 1 tablet (0.4 mg total) under the tongue every 5 (five) minutes as needed for up to 25 days for chest pain.   pantoprazole (PROTONIX) 40 MG tablet Take 1 tablet (40 mg total) by mouth daily.   rosuvastatin (CRESTOR) 10 MG tablet  TAKE 1 TABLET BY MOUTH EVERY DAY (Patient taking differently: Take 10 mg by mouth at bedtime.)   sodium chloride (OCEAN) 0.65 % SOLN nasal spray Place 1 spray into both nostrils as needed for congestion.   triamterene-hydrochlorothiazide (MAXZIDE-25) 37.5-25 MG tablet Take 1 tablet by mouth daily.    VITAMIN D PO Take 1 capsule by mouth 3 (three) times a week.    Radiology:  No results found.  Cardiac Studies:   Echocardiogram 10/15/2019: Left ventricle cavity is normal in size. Moderate concentric hypertrophy of the left ventricle. Normal global wall motion. Normal LV systolic function with visual EF 50-55%. Doppler evidence of grade I (impaired) diastolic dysfunction, normal LAP. Left atrial cavity is mildly dilated. Moderate (Grade II) mitral regurgitation. Structurally normal tricuspid valve.  Mild to moderate tricuspid regurgitation. No evidence of pulmonary hypertension.  Lexiscan (Walking with mod Bruce)Tetrofosmin Stress Test  10/20/2019: Nondiagnostic ECG stress. Mild degree large extent fixed perfusion defect located in the mid anteroseptal wall, mid inferoseptal wall, basal anteroseptal wall and basal inferoseptal wall. This is consistent with LBBB, mild ischemia cannot be completely excluded.  All segments of left ventricle demonstrated normal wall motion and thickening. Stress LV EF is normal 61%.  Low risk. No previous exam available for comparison.  Carotid artery duplex 11/03/2020: Doppler velocity suggests stenosis in the right internal carotid artery (50-69%). Doppler velocity suggests stenosis in the left internal carotid artery (16-49%). There is moderate amount of homogenous plaque bilaterally. Antegrade right vertebral artery flow. Antegrade left vertebral artery flow. Follow up in six months is appropriate if clinically indicated. No significant change from 04/06/2020.  Coronary angiography 06/14/2021: LV: 130/3, EDP 9 mmHg.  Ao 134/61, mean 91 mmHg.  There was no  pressure gradient across the aortic valve. RCA: Large-caliber vessel.  Very mild luminal irregularity. Left main: Large vessel.  No significant disease. Circumflex: Large caliber vessel.  Gives origin to a small OM1 and continues as a large OM 2, no significant disease. LAD: Large vessel proximally, tapers off after the origin of D1.  There is a hazy 60 to 70% stenosis in the mid LAD extending all the way to the proximal LAD.  At the tightest segment appeared to be much more significant. Coronary intervention: OCT guided intervention.  The tightest segment in the mid LAD was 2.1 mm.  Distal reference was 6.4 mm.  Post PCI, the tightest segment measured 6.5 mm, proximal segment of the mid LAD stent measured 7.72 mm.  Overall stenosis reduced from 80% to 0% with TIMI-3 TIMI-3 flow.  140 mL contrast utilized.   EKG   EKG 07/04/2021: Normal sinus rhythm at rate of 74 bpm, left bundle branch block.  No analysis.  No significant change from 05/09/2021.  Assessment     ICD-10-CM   1. Coronary artery disease of native artery of native heart with stable angina pectoris ([place])  I25.118  2. Primary hypertension  I10 EKG 12-Lead    3. Hypercholesteremia  E78.00       No orders of the defined types were placed in this encounter.   Medications Discontinued During This Encounter  Medication Reason   isosorbide dinitrate (ISORDIL) 10 MG tablet Error     Recommendations:   Amanda Lambert  is a 75 y.o. patient with long-standing history of LBBB, hypertension, asymptomatic bilateral carotid stenosis presents for follow-up of angina pectoris.  Cardiac catheterization on 11/04/2019 had revealed a mid LAD 50% stenosis.    She underwent OCT guided PCI on 06/14/2021, felt well for a week but has had recurrence of class III angina pectoris and dyspnea on exertion and has again increased nitrate dose.   I reviewed the angiograms with the patient, there was significant amount of soft plaque burden in the  proximal edge of the stent.  I suspect as her symptoms are real, we should proceed with repeat angiography and possibly place a stent in the proximal segment of the previously placed stent to cover the proximal LAD as it looks hazy and had significant amount of plaque burden.  The proximal LAD is almost left main equivalent with a very huge vessel size proximally.  I also reviewed the angiograms with my partner Dr. Virgina Jock.  I discussed with the patient regarding proceeding with PCI, patient is a retired [employer] scientist, angiograms were personally reviewed with her.  She is willing to proceed as her symptoms are lifestyle limiting.  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  I will see her back after the PCI.  With regard to blood pressure and lipids, excellent control.  Continue the same.  No changes were done to the medications.    Adrian Prows, PA-C 07/04/2021, 2:54 PM Office: [phone]

## 2021-07-04 NOTE — Telephone Encounter (Signed)
Transitions of Care Pharmacy  ° °Call attempted for a pharmacy transitions of care follow-up. HIPAA appropriate voicemail was left with call back information provided.  ° °Call attempt #1. Will follow-up in 2-3 days.  °  °

## 2021-07-04 NOTE — Progress Notes (Signed)
Primary Physician/Referring:  [name], MD  Patient ID: Amanda Lambert, female    DOB: [birth year], 75 y.o.   MRN: [id number]  No chief complaint on file.  HPI:    Amanda Lambert  is a 75 y.o. Caucasian female patient with long-standing history of LBBB, hypertension, asymptomatic bilateral carotid stenosis presents for follow-up of angina pectoris.  Cardiac catheterization on 11/04/2019 had revealed a mid LAD 50% stenosis.    She underwent OCT guided PCI on 06/14/2021, felt well for a week but has had recurrence of class III angina pectoris and dyspnea on exertion and has again increased nitrate dose.    Past Medical History:  Diagnosis Date   Arthritis    right elbow   Biliary dyskinesia    Constipation    GERD (gastroesophageal reflux disease)    Hepatitis B 1975   Hypertension    Nausea    PONV (postoperative nausea and vomiting)    slow to wake up   [facility] spotted fever 04/01/2012   adm. to hospital   Syncope 10/17/2019   Weakness    Past Surgical History:  Procedure Laterality Date   APPENDECTOMY  1963   CHOLECYSTECTOMY  04/26/2012   Procedure: LAPAROSCOPIC CHOLECYSTECTOMY WITH INTRAOPERATIVE CHOLANGIOGRAM;  Surgeon: Merrie Roof, MD;  Location: [facility];  Service: General;  Laterality: N/A;   CORONARY STENT INTERVENTION N/A 06/14/2021   Procedure: CORONARY STENT INTERVENTION;  Surgeon: Adrian Prows, MD;  Location: [place] [facility];  Service: Cardiovascular;  Laterality: N/A;   DILATION AND CURETTAGE OF UTERUS  1982   for miscarriage   DILATION AND CURETTAGE, DIAGNOSTIC / THERAPEUTIC  1972   INTRAVASCULAR IMAGING/OCT N/A 06/14/2021   Procedure: INTRAVASCULAR IMAGING/OCT;  Surgeon: Adrian Prows, MD;  Location: [place] [facility];  Service: Cardiovascular;  Laterality: N/A;   LEFT HEART CATH AND CORONARY ANGIOGRAPHY N/A 11/04/2019   Procedure: LEFT HEART CATH AND CORONARY ANGIOGRAPHY;  Surgeon: Adrian Prows, MD;  Location: [place] [facility];  Service: Cardiovascular;   Laterality: N/A;   LEFT HEART CATH AND CORONARY ANGIOGRAPHY N/A 06/14/2021   Procedure: LEFT HEART CATH AND CORONARY ANGIOGRAPHY;  Surgeon: Adrian Prows, MD;  Location: [place] [facility];  Service: Cardiovascular;  Laterality: N/A;   TONSILLECTOMY  1964  - approximate   WRIST FRACTURE SURGERY  2008   right   Family History  Problem Relation Age of Onset   Cardiomyopathy Mother    Coronary artery disease Father    Hypertension Brother    Prostate cancer Brother    Social History   Tobacco Use   Smoking status: Never   Smokeless tobacco: Never  Substance Use Topics   Alcohol use: No   Marital Status: Widowed ROS  Review of Systems  Cardiovascular:  Positive for chest pain and dyspnea on exertion. Negative for claudication, leg swelling, orthopnea and palpitations.  Respiratory:  Negative for shortness of breath.   Musculoskeletal:  Positive for joint pain.  Gastrointestinal:  Negative for melena.  Neurological:  Negative for dizziness.  Objective  Blood pressure 120/71, pulse 78, temperature (!) 97.3 F (36.3 C), temperature source Temporal, resp. rate 16, height _0  (1.702 m), weight 199 lb (90.3 kg), SpO2 98 %.  Vitals with BMI 07/04/2021 06/14/2021 06/14/2021  Height _1  - -  Weight 199 lbs - -  BMI 01.65 - -  Systolic 537 482 707  Diastolic 71 42 48  Pulse 78 64 65  Some encounter information is confidential  and restricted. Go to Review Flowsheets activity to see all data.     Physical Exam Vitals reviewed.  Constitutional:      Comments: Well-built and mildly obese in no acute distress.  Neck:     Vascular: No carotid bruit or JVD.  Cardiovascular:     Rate and Rhythm: Normal rate and regular rhythm.     Pulses: Normal pulses and intact distal pulses.          Carotid pulses are  on the right side with bruit.    Heart sounds: Normal heart sounds, S1 normal and S2 normal. No murmur heard.   No gallop.  Pulmonary:     Effort: Pulmonary effort is normal. No  respiratory distress.     Breath sounds: Normal breath sounds. No wheezing, rhonchi or rales.  Musculoskeletal:     Right lower leg: No edema.     Left lower leg: No edema.   Laboratory examination:   Recent Labs    05/10/21 1143 06/07/21 1104 06/30/21 0854  NA 136 143 136  K 4.4 3.8 3.7  CL 93* 97 93*  CO2 28 30* 28  GLUCOSE 95 102* 113*  BUN _0 CREATININE 1.00 1.09* 1.12*  CALCIUM 9.6 10.3 10.3   estimated creatinine clearance is 50.9 mL/min (A) (by C-G formula based on SCr of 1.12 mg/dL (H)).  CMP Latest Ref Rng & Units 06/30/2021 06/07/2021 05/10/2021  Glucose 70 - 99 mg/dL 113(H) 102(H) 95  BUN 8 - 27 mg/dL _1 Creatinine 0.57 - 1.00 mg/dL 1.12(H) 1.09(H) 1.00  Sodium 134 - 144 mmol/L 136 143 136  Potassium 3.5 - 5.2 mmol/L 3.7 3.8 4.4  Chloride 96 - 106 mmol/L 93(L) 97 93(L)  CO2 20 - 29 mmol/L 28 30(H) 28  Calcium 8.7 - 10.3 mg/dL 10.3 10.3 9.6  Total Protein 6.0 - 8.5 g/dL 7.0 - -  Total Bilirubin 0.0 - 1.2 mg/dL 0.6 - -  Alkaline Phos 44 - 121 IU/L 98 - -  AST 0 - 40 IU/L 18 - -  ALT 0 - 32 IU/L 10 - -   CBC Latest Ref Rng & Units 06/30/2021 06/07/2021 10/17/2019  WBC 3.4 - 10.8 x10E3/uL 6.3 6.8 6.1  Hemoglobin 11.1 - 15.9 g/dL 12.0 11.8 13.7  Hematocrit 34.0 - 46.6 % 36.8 36.7 42.3  Platelets 150 - 450 x10E3/uL 357 337 340   Lipid Panel     Component Value Date/Time   CHOL 166 06/30/2021 0854   TRIG 96 06/30/2021 0854   HDL 94 06/30/2021 0854   LDLCALC 55 06/30/2021 0854   HEMOGLOBIN A1C No results found for: HGBA1C, MPG TSH Recent Labs    06/30/21 0854  TSH 1.540     External Labs:  Labs 01/25/2021:  Hb 11.88/HCT 35.3, platelets 273, normal indicis.  Serum glucose 106 mg, BUN 12, creatinine 1.08, EGFR 54 mL, potassium 3.8, CMP otherwise normal.  Total cholesterol 131, triglycerides 105, HDL 71, LDL 41. Allergies   Allergies  Allergen Reactions   Ace Inhibitors Anaphylaxis    Angioedema.   Bee Venom Anaphylaxis   Thorazine  [Chlorpromazine] Anaphylaxis   Wasp Venom Anaphylaxis   Levaquin [Levofloxacin In D5w] Nausea And Vomiting    Stated by patient she could not handle levaquin even with antiemetics    Shrimp [Shellfish Allergy] Rash and Hives    "splotches"    Amlodipine     severe leg edema   Elemental Sulfur Itching  Compazine [Prochlorperazine Edisylate] Anxiety   Latex Rash      Medications Prior to Visit:   Outpatient Medications Prior to Visit  Medication Sig Dispense Refill   aspirin EC 81 MG tablet Take 1 tablet (81 mg total) by mouth daily. 90 tablet 3   b complex vitamins capsule Take 1 capsule by mouth 4 (four) times a week.     clopidogrel (PLAVIX) 75 MG tablet Take 1 tablet (75 mg total) by mouth daily. 30 tablet 0   EPINEPHrine 0.3 mg/0.3 mL IJ SOAJ injection Inject 0.3 mg into the muscle once as needed for anaphylaxis.     furosemide (LASIX) 20 MG tablet TAKE 1 TABLET (20 MG TOTAL) BY MOUTH DAILY AS NEEDED FOR EDEMA. (Patient taking differently: Take 40 mg by mouth daily.) 90 tablet 3   gabapentin (NEURONTIN) 300 MG capsule Take 1 capsule by mouth at bedtime as needed (pain).     isosorbide dinitrate (ISORDIL) 20 MG tablet Take 1 tablet (20 mg total) by mouth 3 (three) times daily. (Patient taking differently: Take 40 mg by mouth 3 (three) times daily. Take with 10 mg to equal 30 mg 3 times daily) 270 tablet 3   labetalol (NORMODYNE) 200 MG tablet TAKE 1 TABLET BY MOUTH TWICE A DAY (Patient taking differently: Take 100 mg by mouth 2 (two) times daily.) 180 tablet 1   naproxen sodium (ALEVE) 220 MG tablet Take 440 mg by mouth daily as needed (pain).     nitroGLYCERIN (NITROSTAT) 0.4 MG SL tablet Place 1 tablet (0.4 mg total) under the tongue every 5 (five) minutes as needed for up to 25 days for chest pain. 25 tablet 3   pantoprazole (PROTONIX) 40 MG tablet Take 1 tablet (40 mg total) by mouth daily. 90 tablet 1   rosuvastatin (CRESTOR) 10 MG tablet TAKE 1 TABLET BY MOUTH EVERY DAY  (Patient taking differently: Take 10 mg by mouth at bedtime.) 90 tablet 3   sodium chloride (OCEAN) 0.65 % SOLN nasal spray Place 1 spray into both nostrils as needed for congestion.     triamterene-hydrochlorothiazide (MAXZIDE-25) 37.5-25 MG tablet Take 1 tablet by mouth daily.      VITAMIN D PO Take 1 capsule by mouth 3 (three) times a week.     isosorbide dinitrate (ISORDIL) 10 MG tablet Take 10 mg by mouth 3 (three) times daily. Take with 20 mg to equal 30 mg 3 times daily     No facility-administered medications prior to visit.     Final Medications at End of Visit    Current Meds  Medication Sig   aspirin EC 81 MG tablet Take 1 tablet (81 mg total) by mouth daily.   b complex vitamins capsule Take 1 capsule by mouth 4 (four) times a week.   clopidogrel (PLAVIX) 75 MG tablet Take 1 tablet (75 mg total) by mouth daily.   EPINEPHrine 0.3 mg/0.3 mL IJ SOAJ injection Inject 0.3 mg into the muscle once as needed for anaphylaxis.   furosemide (LASIX) 20 MG tablet TAKE 1 TABLET (20 MG TOTAL) BY MOUTH DAILY AS NEEDED FOR EDEMA. (Patient taking differently: Take 40 mg by mouth daily.)   gabapentin (NEURONTIN) 300 MG capsule Take 1 capsule by mouth at bedtime as needed (pain).   isosorbide dinitrate (ISORDIL) 20 MG tablet Take 1 tablet (20 mg total) by mouth 3 (three) times daily. (Patient taking differently: Take 40 mg by mouth 3 (three) times daily. Take with 10 mg to equal 30 mg 3  times daily)   labetalol (NORMODYNE) 200 MG tablet TAKE 1 TABLET BY MOUTH TWICE A DAY (Patient taking differently: Take 100 mg by mouth 2 (two) times daily.)   naproxen sodium (ALEVE) 220 MG tablet Take 440 mg by mouth daily as needed (pain).   nitroGLYCERIN (NITROSTAT) 0.4 MG SL tablet Place 1 tablet (0.4 mg total) under the tongue every 5 (five) minutes as needed for up to 25 days for chest pain.   pantoprazole (PROTONIX) 40 MG tablet Take 1 tablet (40 mg total) by mouth daily.   rosuvastatin (CRESTOR) 10 MG tablet  TAKE 1 TABLET BY MOUTH EVERY DAY (Patient taking differently: Take 10 mg by mouth at bedtime.)   sodium chloride (OCEAN) 0.65 % SOLN nasal spray Place 1 spray into both nostrils as needed for congestion.   triamterene-hydrochlorothiazide (MAXZIDE-25) 37.5-25 MG tablet Take 1 tablet by mouth daily.    VITAMIN D PO Take 1 capsule by mouth 3 (three) times a week.    Radiology:  No results found.  Cardiac Studies:   Echocardiogram 10/15/2019: Left ventricle cavity is normal in size. Moderate concentric hypertrophy of the left ventricle. Normal global wall motion. Normal LV systolic function with visual EF 50-55%. Doppler evidence of grade I (impaired) diastolic dysfunction, normal LAP. Left atrial cavity is mildly dilated. Moderate (Grade II) mitral regurgitation. Structurally normal tricuspid valve.  Mild to moderate tricuspid regurgitation. No evidence of pulmonary hypertension.  Lexiscan (Walking with mod Bruce)Tetrofosmin Stress Test  10/20/2019: Nondiagnostic ECG stress. Mild degree large extent fixed perfusion defect located in the mid anteroseptal wall, mid inferoseptal wall, basal anteroseptal wall and basal inferoseptal wall. This is consistent with LBBB, mild ischemia cannot be completely excluded.  All segments of left ventricle demonstrated normal wall motion and thickening. Stress LV EF is normal 61%.  Low risk. No previous exam available for comparison.  Carotid artery duplex 11/03/2020: Doppler velocity suggests stenosis in the right internal carotid artery (50-69%). Doppler velocity suggests stenosis in the left internal carotid artery (16-49%). There is moderate amount of homogenous plaque bilaterally. Antegrade right vertebral artery flow. Antegrade left vertebral artery flow. Follow up in six months is appropriate if clinically indicated. No significant change from 04/06/2020.  Coronary angiography 06/14/2021: LV: 130/3, EDP 9 mmHg.  Ao 134/61, mean 91 mmHg.  There was no  pressure gradient across the aortic valve. RCA: Large-caliber vessel.  Very mild luminal irregularity. Left main: Large vessel.  No significant disease. Circumflex: Large caliber vessel.  Gives origin to a small OM1 and continues as a large OM 2, no significant disease. LAD: Large vessel proximally, tapers off after the origin of D1.  There is a hazy 60 to 70% stenosis in the mid LAD extending all the way to the proximal LAD.  At the tightest segment appeared to be much more significant. Coronary intervention: OCT guided intervention.  The tightest segment in the mid LAD was 2.1 mm.  Distal reference was 6.4 mm.  Post PCI, the tightest segment measured 6.5 mm, proximal segment of the mid LAD stent measured 7.72 mm.  Overall stenosis reduced from 80% to 0% with TIMI-3 TIMI-3 flow.  140 mL contrast utilized.   EKG   EKG 07/04/2021: Normal sinus rhythm at rate of 74 bpm, left bundle branch block.  No analysis.  No significant change from 05/09/2021.  Assessment     ICD-10-CM   1. Coronary artery disease of native artery of native heart with stable angina pectoris ([place])  I25.118  2. Primary hypertension  I10 EKG 12-Lead    3. Hypercholesteremia  E78.00       No orders of the defined types were placed in this encounter.   Medications Discontinued During This Encounter  Medication Reason   isosorbide dinitrate (ISORDIL) 10 MG tablet Error     Recommendations:   Amanda Lambert  is a 75 y.o. patient with long-standing history of LBBB, hypertension, asymptomatic bilateral carotid stenosis presents for follow-up of angina pectoris.  Cardiac catheterization on 11/04/2019 had revealed a mid LAD 50% stenosis.    She underwent OCT guided PCI on 06/14/2021, felt well for a week but has had recurrence of class III angina pectoris and dyspnea on exertion and has again increased nitrate dose.   I reviewed the angiograms with the patient, there was significant amount of soft plaque burden in the  proximal edge of the stent.  I suspect as her symptoms are real, we should proceed with repeat angiography and possibly place a stent in the proximal segment of the previously placed stent to cover the proximal LAD as it looks hazy and had significant amount of plaque burden.  The proximal LAD is almost left main equivalent with a very huge vessel size proximally.  I also reviewed the angiograms with my partner Dr. Virgina Jock.  I discussed with the patient regarding proceeding with PCI, patient is a retired [employer] scientist, angiograms were personally reviewed with her.  She is willing to proceed as her symptoms are lifestyle limiting.  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  I will see her back after the PCI.  With regard to blood pressure and lipids, excellent control.  Continue the same.  No changes were done to the medications.    Adrian Prows, PA-C 07/04/2021, 2:54 PM Office: [phone]

## 2021-07-05 ENCOUNTER — Other Ambulatory Visit (HOSPITAL_COMMUNITY): Payer: Self-pay

## 2021-07-05 ENCOUNTER — Telehealth (HOSPITAL_COMMUNITY): Payer: Self-pay | Admitting: Pharmacist

## 2021-07-05 NOTE — Telephone Encounter (Signed)
Pharmacy Transitions of Care Follow-up Telephone Call  Date of discharge: 06/14/21  Discharge Diagnosis: LAD stenosis  How have you been since you were released from the hospital?  Pt has had initial cardiology follow-up, she is doing well overall but still bothered by angina symptoms, she has a repeat cath scheduled for 10/25 with possibility of stent placement at this time.   Medication changes made at discharge:     START taking: clopidogrel (Plavix)  pantoprazole (Protonix)  STOP taking: esomeprazole 20 MG capsule (NEXIUM)   Also discussed Isosorbide dose, pt discussed increasing the dose at her recent appt with Dr. Einar Gip, she is now taking 40mg  three times daily and will ask Dr. Einar Gip to update her rx and sent new rx to home pharmacy.    Medication changes verified by the patient? Yes  Medication Accessibility:  Home Pharmacy: [facility] [place], [place]  Pt is retired from [employer] and lives in [place], she will consider moving rxs to [place] and was pleased to find out we have a location there.   Was the patient provided with refills on discharged medications? 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    Have all prescriptions been transferred from [facility] to home pharmacy? N/a   Is the patient able to afford medications? Has Medicare PartD, clopidogrel $4    Medication Review:  CLOPIDOGREL (PLAVIX) Clopidogrel 75 mg once daily.  - Educated patient on expected duration of therapy of ASA with clopidogrel.  - Advised patient of medications to avoid (NSAIDs, ASA)  - Educated that Tylenol (acetaminophen) will be the preferred analgesic to prevent risk of bleeding  - Emphasized importance of monitoring for signs and symptoms of bleeding (abnormal bruising, prolonged bleeding, nose bleeds, bleeding from gums, discolored urine, black tarry stools)  - Advised patient to alert all providers of anticoagulation therapy prior to  starting a new medication or having a procedure   Follow-up Appointments:  PCP Hospital f/u appt confirmed? None currently scheduled   Specialist Hospital f/u appt confirmed? Saw Dr. Einar Gip on 07/04/21 @ Cardiology.  Repeat cath scheduled for 10/25  Next cardiology follow-up on 08/17/21  If their condition worsens, is the pt aware to call PCP or go to the Emergency Dept.? Yes  Final Patient Assessment: Pt is doing well and has good understanding of her medications.  It was a pleasure speaking with her.

## 2021-07-07 ENCOUNTER — Other Ambulatory Visit (HOSPITAL_COMMUNITY): Payer: Self-pay

## 2021-07-07 ENCOUNTER — Other Ambulatory Visit: Payer: Self-pay | Admitting: Cardiology

## 2021-07-15 ENCOUNTER — Other Ambulatory Visit: Payer: Self-pay

## 2021-07-15 MED ORDER — CLOPIDOGREL BISULFATE 75 MG PO TABS
75.0000 mg | ORAL_TABLET | Freq: Every day | ORAL | 0 refills | Status: DC
Start: 2021-07-15 — End: 2021-08-08

## 2021-07-19 ENCOUNTER — Ambulatory Visit (HOSPITAL_COMMUNITY)
Admission: RE | Disposition: A | Discharge status: Home / Self Care | Payer: Self-pay | Source: Home / Self Care | Attending: Cardiology

## 2021-07-19 ENCOUNTER — Ambulatory Visit (HOSPITAL_COMMUNITY)
Admission: RE | Admit: 2021-07-19 | Discharge: 2021-07-19 | Disposition: A | Discharge status: Home / Self Care | Payer: Medicare Other | Attending: Cardiology | Admitting: Cardiology

## 2021-07-19 DIAGNOSIS — E7800 Pure hypercholesterolemia, unspecified: Secondary | ICD-10-CM | POA: Insufficient documentation

## 2021-07-19 DIAGNOSIS — Z955 Presence of coronary angioplasty implant and graft: Secondary | ICD-10-CM | POA: Insufficient documentation

## 2021-07-19 DIAGNOSIS — R0609 Other forms of dyspnea: Secondary | ICD-10-CM | POA: Insufficient documentation

## 2021-07-19 DIAGNOSIS — Z91040 Latex allergy status: Secondary | ICD-10-CM | POA: Insufficient documentation

## 2021-07-19 DIAGNOSIS — I6523 Occlusion and stenosis of bilateral carotid arteries: Secondary | ICD-10-CM | POA: Insufficient documentation

## 2021-07-19 DIAGNOSIS — I10 Essential (primary) hypertension: Secondary | ICD-10-CM | POA: Insufficient documentation

## 2021-07-19 DIAGNOSIS — Z7902 Long term (current) use of antithrombotics/antiplatelets: Secondary | ICD-10-CM | POA: Diagnosis not present

## 2021-07-19 DIAGNOSIS — Z881 Allergy status to other antibiotic agents status: Secondary | ICD-10-CM | POA: Diagnosis not present

## 2021-07-19 DIAGNOSIS — Z8249 Family history of ischemic heart disease and other diseases of the circulatory system: Secondary | ICD-10-CM | POA: Insufficient documentation

## 2021-07-19 DIAGNOSIS — Z79899 Other long term (current) drug therapy: Secondary | ICD-10-CM | POA: Diagnosis not present

## 2021-07-19 DIAGNOSIS — Z91013 Allergy to seafood: Secondary | ICD-10-CM | POA: Insufficient documentation

## 2021-07-19 DIAGNOSIS — Z888 Allergy status to other drugs, medicaments and biological substances status: Secondary | ICD-10-CM | POA: Diagnosis not present

## 2021-07-19 DIAGNOSIS — Z7982 Long term (current) use of aspirin: Secondary | ICD-10-CM | POA: Insufficient documentation

## 2021-07-19 DIAGNOSIS — I25118 Atherosclerotic heart disease of native coronary artery with other forms of angina pectoris: Secondary | ICD-10-CM | POA: Diagnosis not present

## 2021-07-19 DIAGNOSIS — I447 Left bundle-branch block, unspecified: Secondary | ICD-10-CM | POA: Diagnosis not present

## 2021-07-19 DIAGNOSIS — I25119 Atherosclerotic heart disease of native coronary artery with unspecified angina pectoris: Secondary | ICD-10-CM | POA: Diagnosis not present

## 2021-07-19 DIAGNOSIS — I2510 Atherosclerotic heart disease of native coronary artery without angina pectoris: Secondary | ICD-10-CM | POA: Diagnosis not present

## 2021-07-19 HISTORY — PX: CORONARY PRESSURE/FFR STUDY: CATH118243

## 2021-07-19 HISTORY — PX: INTRAVASCULAR PRESSURE WIRE/FFR STUDY: CATH118243

## 2021-07-19 HISTORY — PX: CORONARY ANGIOGRAPHY: CATH118303

## 2021-07-19 LAB — POCT ACTIVATED CLOTTING TIME: Activated Clotting Time: 260 seconds

## 2021-07-19 SURGERY — CORONARY ANGIOGRAPHY (CATH LAB)
Anesthesia: LOCAL

## 2021-07-19 MED ORDER — SODIUM CHLORIDE 0.9% FLUSH: 3.0000 mL | INTRAVENOUS | Status: DC | PRN

## 2021-07-19 MED ORDER — MIDAZOLAM HCL 2 MG/2ML IJ SOLN
INTRAMUSCULAR | Status: DC | PRN
  Administered 2021-07-19 (×2): 2 mg via INTRAVENOUS

## 2021-07-19 MED ORDER — SODIUM CHLORIDE 0.9 % WEIGHT BASED INFUSION: 1.0000 mL/kg/h | INTRAVENOUS | Status: DC

## 2021-07-19 MED ORDER — SODIUM CHLORIDE 0.9 % WEIGHT BASED INFUSION
3.0000 mL/kg/h | INTRAVENOUS | Status: AC
  Administered 2021-07-19: 3 mL/kg/h via INTRAVENOUS

## 2021-07-19 MED ORDER — ACETAMINOPHEN 325 MG PO TABS: 650.0000 mg | ORAL_TABLET | ORAL | Status: DC | PRN

## 2021-07-19 MED ORDER — NITROGLYCERIN 1 MG/10 ML FOR IR/CATH LAB
INTRA_ARTERIAL | Status: AC
  Filled 2021-07-19: qty 10

## 2021-07-19 MED ORDER — VERAPAMIL HCL 2.5 MG/ML IV SOLN
INTRAVENOUS | Status: DC | PRN
  Administered 2021-07-19: 10 mL via INTRA_ARTERIAL

## 2021-07-19 MED ORDER — HEPARIN SODIUM (PORCINE) 1000 UNIT/ML IJ SOLN
INTRAMUSCULAR | Status: DC | PRN
  Administered 2021-07-19: 2000 [IU] via INTRAVENOUS
  Administered 2021-07-19: 8000 [IU] via INTRAVENOUS

## 2021-07-19 MED ORDER — VERAPAMIL HCL 2.5 MG/ML IV SOLN
INTRAVENOUS | Status: AC
  Filled 2021-07-19: qty 2

## 2021-07-19 MED ORDER — ONDANSETRON HCL 4 MG/2ML IJ SOLN: 4.0000 mg | Freq: Four times a day (QID) | INTRAMUSCULAR | Status: DC | PRN

## 2021-07-19 MED ORDER — SODIUM CHLORIDE 0.9 % WEIGHT BASED INFUSION
3.0000 mL/kg/h | INTRAVENOUS | Status: DC
Start: 2021-07-20 — End: 2021-07-19

## 2021-07-19 MED ORDER — SODIUM CHLORIDE 0.9 % IV SOLN: 250.0000 mL | INTRAVENOUS | Status: DC | PRN

## 2021-07-19 MED ORDER — ASPIRIN 81 MG PO CHEW: 81.0000 mg | CHEWABLE_TABLET | ORAL | Status: DC

## 2021-07-19 MED ORDER — SODIUM CHLORIDE 0.9% FLUSH: 3.0000 mL | Freq: Two times a day (BID) | INTRAVENOUS | Status: DC

## 2021-07-19 MED ORDER — HEPARIN (PORCINE) IN NACL 1000-0.9 UT/500ML-% IV SOLN
INTRAVENOUS | Status: DC | PRN
  Administered 2021-07-19 (×2): 500 mL

## 2021-07-19 MED ORDER — MIDAZOLAM HCL 2 MG/2ML IJ SOLN
INTRAMUSCULAR | Status: AC
  Filled 2021-07-19: qty 2

## 2021-07-19 MED ORDER — FENTANYL CITRATE (PF) 100 MCG/2ML IJ SOLN
INTRAMUSCULAR | Status: DC | PRN
  Administered 2021-07-19: 25 ug via INTRAVENOUS

## 2021-07-19 MED ORDER — HEPARIN (PORCINE) IN NACL 1000-0.9 UT/500ML-% IV SOLN
INTRAVENOUS | Status: AC
  Filled 2021-07-19: qty 1000

## 2021-07-19 MED ORDER — LIDOCAINE HCL (PF) 1 % IJ SOLN
INTRAMUSCULAR | Status: DC | PRN
  Administered 2021-07-19: 2 mL

## 2021-07-19 MED ORDER — ADENOSINE 12 MG/4ML IV SOLN
INTRAVENOUS | Status: AC
  Filled 2021-07-19: qty 16

## 2021-07-19 MED ORDER — FENTANYL CITRATE (PF) 100 MCG/2ML IJ SOLN
INTRAMUSCULAR | Status: AC
  Filled 2021-07-19: qty 2

## 2021-07-19 MED ORDER — IOHEXOL 350 MG/ML SOLN
INTRAVENOUS | Status: DC | PRN
  Administered 2021-07-19: 55 mL

## 2021-07-19 MED ORDER — HEPARIN SODIUM (PORCINE) 1000 UNIT/ML IJ SOLN
INTRAMUSCULAR | Status: AC
  Filled 2021-07-19: qty 1

## 2021-07-19 MED ORDER — LIDOCAINE HCL (PF) 1 % IJ SOLN
INTRAMUSCULAR | Status: AC
  Filled 2021-07-19: qty 30

## 2021-07-19 MED ORDER — ADENOSINE (DIAGNOSTIC) 140MCG/KG/MIN
INTRAVENOUS | Status: DC | PRN
  Administered 2021-07-19: 140 ug/kg/min via INTRAVENOUS

## 2021-07-19 SURGICAL SUPPLY — 13 items
CATH VISTA GUIDE 6FR XBLAD3.5 (CATHETERS) ×1 IMPLANT
DEVICE RAD COMP TR BAND LRG (VASCULAR PRODUCTS) ×1 IMPLANT
GLIDESHEATH SLEND A-KIT 6F 22G (SHEATH) ×1 IMPLANT
GUIDEWIRE INQWIRE 1.5J.035X260 (WIRE) IMPLANT
GUIDEWIRE PRESSURE X 175 (WIRE) ×1 IMPLANT
INQWIRE 1.5J .035X260CM (WIRE) ×2
KIT ENCORE 26 ADVANTAGE (KITS) ×1 IMPLANT
KIT HEART LEFT (KITS) ×4 IMPLANT
PACK CARDIAC CATHETERIZATION (CUSTOM PROCEDURE TRAY) ×4 IMPLANT
SHEATH PROBE COVER 6X72 (BAG) ×1 IMPLANT
TRANSDUCER W/STOPCOCK (MISCELLANEOUS) ×4 IMPLANT
TUBING CIL FLEX 10 FLL-RA (TUBING) ×4 IMPLANT
WIRE COUGAR XT STRL 190CM (WIRE) ×1 IMPLANT

## 2021-07-19 NOTE — Interval H&P Note (Signed)
History and Physical Interval Note:  07/19/2021 1:30 PM  Amanda Lambert  has presented today for surgery, with the diagnosis of cad.  The various methods of treatment have been discussed with the patient and family. After consideration of risks, benefits and other options for treatment, the patient has consented to  Procedure(s): CORONARY STENT INTERVENTION (N/A) as a surgical intervention.  The patient's history has been reviewed, patient examined, no change in status, stable for surgery.  I have reviewed the patient's chart and labs.  Questions were answered to the patient's satisfaction.    Cath Lab Visit (complete for each Cath Lab visit)  Clinical Evaluation Leading to the Procedure:   ACS: No.  Non-ACS:    Anginal Classification: CCS III  Anti-ischemic medical therapy: Maximal Therapy (2 or more classes of medications)  Non-Invasive Test Results: No non-invasive testing performed  Prior CABG: Previous CABG    Adrian Prows

## 2021-07-20 ENCOUNTER — Other Ambulatory Visit: Payer: Self-pay | Admitting: Cardiology

## 2021-07-20 ENCOUNTER — Encounter (HOSPITAL_COMMUNITY): Payer: Self-pay | Admitting: Cardiology

## 2021-07-20 DIAGNOSIS — I6523 Occlusion and stenosis of bilateral carotid arteries: Secondary | ICD-10-CM

## 2021-07-20 DIAGNOSIS — I209 Angina pectoris, unspecified: Secondary | ICD-10-CM

## 2021-07-20 MED FILL — Nitroglycerin IV Soln 100 MCG/ML in D5W: INTRA_ARTERIAL | Qty: 10 | Status: AC

## 2021-08-06 ENCOUNTER — Other Ambulatory Visit: Payer: Self-pay | Admitting: Cardiology

## 2021-08-16 ENCOUNTER — Ambulatory Visit: Payer: Medicare Other | Admitting: Cardiology

## 2021-08-16 ENCOUNTER — Other Ambulatory Visit: Payer: Self-pay

## 2021-08-16 ENCOUNTER — Encounter: Payer: Self-pay | Admitting: Cardiology

## 2021-08-16 VITALS — BP 133/73 | HR 73 | Temp 98.7°F | Resp 16 | Ht 67.0 in | Wt 198.0 lb

## 2021-08-16 DIAGNOSIS — E7800 Pure hypercholesterolemia, unspecified: Secondary | ICD-10-CM

## 2021-08-16 DIAGNOSIS — I25118 Atherosclerotic heart disease of native coronary artery with other forms of angina pectoris: Secondary | ICD-10-CM | POA: Diagnosis not present

## 2021-08-16 DIAGNOSIS — I201 Angina pectoris with documented spasm: Secondary | ICD-10-CM | POA: Diagnosis not present

## 2021-08-16 DIAGNOSIS — I10 Essential (primary) hypertension: Secondary | ICD-10-CM | POA: Diagnosis not present

## 2021-08-16 NOTE — Progress Notes (Signed)
Primary Physician/Referring:  [name], MD  Patient ID: Amanda Lambert, female    DOB: 05-09-1946, 75 y.o.   MRN: [id number]  Chief Complaint  Patient presents with   Coronary Artery Disease   Post PCI   Follow-up    6 weeks    HPI:    Amanda Lambert  is a 75 y.o. Caucasian female patient with long-standing history of LBBB, hypertension, asymptomatic bilateral carotid stenosis presents for follow-up of angina pectoris.  Cardiac catheterization on 11/04/2019 had revealed a mid LAD 50% stenosis.  She underwent OCT guided PCI on 06/14/2021. Due to recurrent chest discomfort, underwent repeat cardiac catheterization on 07/19/2021 which revealed widely patent LAD and also previously placed stent.  She now presents for follow-up, she did undergo RFR and also CFR/IMR during the process and this revealed no significant microvascular disease as well.   Patient states that since cardiac catheterization, she has noticed marked improvement in exertional chest pain.  She still has exertional dyspnea but states that overall she is feeling much better.  She is tolerating all medications without side effects.  Past Medical History:  Diagnosis Date   Arthritis    right elbow   Biliary dyskinesia    Constipation    GERD (gastroesophageal reflux disease)    Hepatitis B 1975   Hypertension    Nausea    PONV (postoperative nausea and vomiting)    slow to wake up   [facility] spotted fever 04/01/2012   adm. to hospital   Syncope 10/17/2019   Weakness    Past Surgical History:  Procedure Laterality Date   APPENDECTOMY  1963   CHOLECYSTECTOMY  04/26/2012   Procedure: LAPAROSCOPIC CHOLECYSTECTOMY WITH INTRAOPERATIVE CHOLANGIOGRAM;  Surgeon: Merrie Roof, MD;  Location: [facility];  Service: General;  Laterality: N/A;   CORONARY ANGIOGRAPHY N/A 07/19/2021   Procedure: CORONARY ANGIOGRAPHY (CATH LAB);  Surgeon: Adrian Prows, MD;  Location: [place] [facility];  Service: Cardiovascular;  Laterality:  N/A;   CORONARY STENT INTERVENTION N/A 06/14/2021   Procedure: CORONARY STENT INTERVENTION;  Surgeon: Adrian Prows, MD;  Location: [place] [facility];  Service: Cardiovascular;  Laterality: N/A;   DILATION AND CURETTAGE OF UTERUS  1982   for miscarriage   DILATION AND CURETTAGE, DIAGNOSTIC / THERAPEUTIC  1972   INTRAVASCULAR IMAGING/OCT N/A 06/14/2021   Procedure: INTRAVASCULAR IMAGING/OCT;  Surgeon: Adrian Prows, MD;  Location: [place] [facility];  Service: Cardiovascular;  Laterality: N/A;   INTRAVASCULAR PRESSURE WIRE/FFR STUDY N/A 07/19/2021   Procedure: INTRAVASCULAR PRESSURE WIRE/FFR STUDY;  Surgeon: Adrian Prows, MD;  Location: [place] [facility];  Service: Cardiovascular;  Laterality: N/A;   LEFT HEART CATH AND CORONARY ANGIOGRAPHY N/A 11/04/2019   Procedure: LEFT HEART CATH AND CORONARY ANGIOGRAPHY;  Surgeon: Adrian Prows, MD;  Location: [place] [facility];  Service: Cardiovascular;  Laterality: N/A;   LEFT HEART CATH AND CORONARY ANGIOGRAPHY N/A 06/14/2021   Procedure: LEFT HEART CATH AND CORONARY ANGIOGRAPHY;  Surgeon: Adrian Prows, MD;  Location: [place] [facility];  Service: Cardiovascular;  Laterality: N/A;   TONSILLECTOMY  1964  - approximate   WRIST FRACTURE SURGERY  2008   right   Family History  Problem Relation Age of Onset   Cardiomyopathy Mother    Coronary artery disease Father    Hypertension Brother    Prostate cancer Brother    Social History   Tobacco Use   Smoking status: Never   Smokeless tobacco: Never  Substance Use  Topics   Alcohol use: No   Marital Status: Widowed ROS  Review of Systems  Cardiovascular:  Positive for chest pain and dyspnea on exertion. Negative for claudication, leg swelling, orthopnea and palpitations.  Respiratory:  Negative for shortness of breath.   Musculoskeletal:  Positive for joint pain.  Gastrointestinal:  Negative for melena.  Neurological:  Negative for dizziness.  Objective  Blood pressure 133/73, pulse 73, temperature 98.7 F  (37.1 C), temperature source Temporal, resp. rate 16, height _0  (1.702 m), weight 198 lb (89.8 kg), SpO2 99 %.  Vitals with BMI 08/16/2021 07/19/2021 07/19/2021  Height _1  - -  Weight 198 lbs - -  BMI 31 - -  Systolic 063 016 010  Diastolic 73 55 52  Pulse 73 66 68  Some encounter information is confidential and restricted. Go to Review Flowsheets activity to see all data.     Physical Exam Vitals reviewed.  Constitutional:      Comments: Well-built and mildly obese in no acute distress.  Neck:     Vascular: No carotid bruit or JVD.  Cardiovascular:     Rate and Rhythm: Normal rate and regular rhythm.     Pulses: Normal pulses and intact distal pulses.          Carotid pulses are  on the right side with bruit.    Heart sounds: Normal heart sounds, S1 normal and S2 normal. No murmur heard.   No gallop.  Pulmonary:     Effort: Pulmonary effort is normal. No respiratory distress.     Breath sounds: Normal breath sounds. No wheezing, rhonchi or rales.  Musculoskeletal:     Right lower leg: No edema.     Left lower leg: No edema.   Laboratory examination:   Recent Labs    05/10/21 1143 06/07/21 1104 06/30/21 0854  NA 136 143 136  K 4.4 3.8 3.7  CL 93* 97 93*  CO2 28 30* 28  GLUCOSE 95 102* 113*  BUN _2 CREATININE 1.00 1.09* 1.12*  CALCIUM 9.6 10.3 10.3   CrCl cannot be calculated (Patient's most recent lab result is older than the maximum 21 days allowed.).  CMP Latest Ref Rng & Units 06/30/2021 06/07/2021 05/10/2021  Glucose 70 - 99 mg/dL 113(H) 102(H) 95  BUN 8 - 27 mg/dL _3 Creatinine 0.57 - 1.00 mg/dL 1.12(H) 1.09(H) 1.00  Sodium 134 - 144 mmol/L 136 143 136  Potassium 3.5 - 5.2 mmol/L 3.7 3.8 4.4  Chloride 96 - 106 mmol/L 93(L) 97 93(L)  CO2 20 - 29 mmol/L 28 30(H) 28  Calcium 8.7 - 10.3 mg/dL 10.3 10.3 9.6  Total Protein 6.0 - 8.5 g/dL 7.0 - -  Total Bilirubin 0.0 - 1.2 mg/dL 0.6 - -  Alkaline Phos 44 - 121 IU/L 98 - -  AST 0 - 40 IU/L 18 -  -  ALT 0 - 32 IU/L 10 - -   CBC Latest Ref Rng & Units 06/30/2021 06/07/2021 10/17/2019  WBC 3.4 - 10.8 x10E3/uL 6.3 6.8 6.1  Hemoglobin 11.1 - 15.9 g/dL 12.0 11.8 13.7  Hematocrit 34.0 - 46.6 % 36.8 36.7 42.3  Platelets 150 - 450 x10E3/uL 357 337 340   Lipid Panel     Component Value Date/Time   CHOL 166 06/30/2021 0854   TRIG 96 06/30/2021 0854   HDL 94 06/30/2021 0854   LDLCALC 55 06/30/2021 0854   HEMOGLOBIN A1C No results found for: HGBA1C, MPG TSH  Recent Labs    06/30/21 0854  TSH 1.540     External Labs:  Labs 01/25/2021:  Hb 11.88/HCT 35.3, platelets 273, normal indicis.  Serum glucose 106 mg, BUN 12, creatinine 1.08, EGFR 54 mL, potassium 3.8, CMP otherwise normal.  Total cholesterol 131, triglycerides 105, HDL 71, LDL 41. Allergies   Allergies  Allergen Reactions   Ace Inhibitors Anaphylaxis    Angioedema.   Bee Venom Anaphylaxis   Thorazine [Chlorpromazine] Anaphylaxis   Wasp Venom Anaphylaxis   Levaquin [Levofloxacin In D5w] Nausea And Vomiting    Stated by patient she could not handle levaquin even with antiemetics    Shrimp [Shellfish Allergy] Rash and Hives    "splotches"    Amlodipine     severe leg edema   Elemental Sulfur Itching        Compazine [Prochlorperazine Edisylate] Anxiety   Latex Rash    Medications Prior to Visit:   Outpatient Medications Prior to Visit  Medication Sig Dispense Refill   aspirin EC 81 MG tablet Take 1 tablet (81 mg total) by mouth daily. 90 tablet 3   clopidogrel (PLAVIX) 75 MG tablet TAKE 1 TABLET BY MOUTH EVERY DAY 90 tablet 3   Cyanocobalamin (VITAMIN B-12 PO) Take 1 tablet by mouth in the morning.     EPINEPHrine 0.3 mg/0.3 mL IJ SOAJ injection Inject 0.3 mg into the muscle once as needed for anaphylaxis.     esomeprazole (NEXIUM) 20 MG capsule Take 40 mg by mouth in the morning.     furosemide (LASIX) 20 MG tablet TAKE 1 TABLET (20 MG TOTAL) BY MOUTH DAILY AS NEEDED FOR EDEMA. (Patient taking differently:  Take 40 mg by mouth daily.) 90 tablet 3   gabapentin (NEURONTIN) 300 MG capsule Take 300 mg by mouth at bedtime.     isosorbide dinitrate (ISORDIL) 20 MG tablet Take 1 tablet (20 mg total) by mouth 3 (three) times daily. (Patient taking differently: Take 40 mg by mouth in the morning and at bedtime.) 270 tablet 3   labetalol (NORMODYNE) 200 MG tablet TAKE 1 TABLET BY MOUTH TWICE A DAY (Patient taking differently: Take 100 mg by mouth 2 (two) times daily.) 180 tablet 1   nitroGLYCERIN (NITROSTAT) 0.4 MG SL tablet Place 1 tablet (0.4 mg total) under the tongue every 5 (five) minutes as needed for up to 25 days for chest pain. 25 tablet 3   rosuvastatin (CRESTOR) 10 MG tablet TAKE 1 TABLET BY MOUTH EVERY DAY 90 tablet 3   sodium chloride (OCEAN) 0.65 % SOLN nasal spray Place 1 spray into both nostrils as needed for congestion.     triamterene-hydrochlorothiazide (MAXZIDE-25) 37.5-25 MG tablet Take 1 tablet by mouth in the morning.     VITAMIN D PO Take 1 capsule by mouth every other day. In the morning     No facility-administered medications prior to visit.   Final Medications at End of Visit    Current Meds  Medication Sig   aspirin EC 81 MG tablet Take 1 tablet (81 mg total) by mouth daily.   clopidogrel (PLAVIX) 75 MG tablet TAKE 1 TABLET BY MOUTH EVERY DAY   Cyanocobalamin (VITAMIN B-12 PO) Take 1 tablet by mouth in the morning.   EPINEPHrine 0.3 mg/0.3 mL IJ SOAJ injection Inject 0.3 mg into the muscle once as needed for anaphylaxis.   esomeprazole (NEXIUM) 20 MG capsule Take 40 mg by mouth in the morning.   furosemide (LASIX) 20 MG tablet TAKE 1 TABLET (20  MG TOTAL) BY MOUTH DAILY AS NEEDED FOR EDEMA. (Patient taking differently: Take 40 mg by mouth daily.)   gabapentin (NEURONTIN) 300 MG capsule Take 300 mg by mouth at bedtime.   isosorbide dinitrate (ISORDIL) 20 MG tablet Take 1 tablet (20 mg total) by mouth 3 (three) times daily. (Patient taking differently: Take 40 mg by mouth in the  morning and at bedtime.)   labetalol (NORMODYNE) 200 MG tablet TAKE 1 TABLET BY MOUTH TWICE A DAY (Patient taking differently: Take 100 mg by mouth 2 (two) times daily.)   nitroGLYCERIN (NITROSTAT) 0.4 MG SL tablet Place 1 tablet (0.4 mg total) under the tongue every 5 (five) minutes as needed for up to 25 days for chest pain.   rosuvastatin (CRESTOR) 10 MG tablet TAKE 1 TABLET BY MOUTH EVERY DAY   sodium chloride (OCEAN) 0.65 % SOLN nasal spray Place 1 spray into both nostrils as needed for congestion.   triamterene-hydrochlorothiazide (MAXZIDE-25) 37.5-25 MG tablet Take 1 tablet by mouth in the morning.   VITAMIN D PO Take 1 capsule by mouth every other day. In the morning    Radiology:  No results found.  Cardiac Studies:   Echocardiogram 10/15/2019: Left ventricle cavity is normal in size. Moderate concentric hypertrophy of the left ventricle. Normal global wall motion. Normal LV systolic function with visual EF 50-55%. Doppler evidence of grade I (impaired) diastolic dysfunction, normal LAP. Left atrial cavity is mildly dilated. Moderate (Grade II) mitral regurgitation. Structurally normal tricuspid valve.  Mild to moderate tricuspid regurgitation. No evidence of pulmonary hypertension.  Lexiscan (Walking with mod Bruce)Tetrofosmin Stress Test  10/20/2019: Nondiagnostic ECG stress. Mild degree large extent fixed perfusion defect located in the mid anteroseptal wall, mid inferoseptal wall, basal anteroseptal wall and basal inferoseptal wall. This is consistent with LBBB, mild ischemia cannot be completely excluded.  All segments of left ventricle demonstrated normal wall motion and thickening. Stress LV EF is normal 61%.  Low risk. No previous exam available for comparison.  Carotid artery duplex 11/03/2020: Doppler velocity suggests stenosis in the right internal carotid artery (50-69%). Doppler velocity suggests stenosis in the left internal carotid artery (16-49%). There is  moderate amount of homogenous plaque bilaterally. Antegrade right vertebral artery flow. Antegrade left vertebral artery flow. Follow up in six months is appropriate if clinically indicated. No significant change from 04/06/2020.  Coronary angiography 06/14/2021: LV: 130/3, EDP 9 mmHg.  Ao 134/61, mean 91 mmHg.  There was no pressure gradient across the aortic valve. RCA: Large-caliber vessel.  Very mild luminal irregularity. Left main: Large vessel.  No significant disease. Circumflex: Large caliber vessel.  Gives origin to a small OM1 and continues as a large OM 2, no significant disease. LAD: Large vessel proximally, tapers off after the origin of D1.  There is a hazy 60 to 70% stenosis in the mid LAD extending all the way to the proximal LAD.  At the tightest segment appeared to be much more significant. Coronary intervention: OCT guided intervention.  The tightest segment in the mid LAD was 2.1 mm.  Distal reference was 6.4 mm.  Post PCI, the tightest segment measured 6.5 mm, proximal segment of the mid LAD stent measured 7.72 mm.  Overall stenosis reduced from 80% to 0% with TIMI-3 TIMI-3 flow.  140 mL contrast utilized.  Left Heart Catheterization 07/19/21: LM: Smooth and normal. CX: Smooth and normal. LAD: Large vessel, gives origin to large D1.  Just after the origin of large D1 and a large septal perforator, there  is a stent that was previously placed 06/14/2021, 3.0 x 30 mm Onyx is widely patent.  Prior to the stent, the proximal segment at the bifurcation of D1 and large septal perforator, there is a 30" 40% stenosis. RFR was 0.92, FFR 0.89, CFR 4.8, IMR 18.  Findings do not suggest macrovascular or microvascular disease. RCA: Not studied, previously normal.   Impression: Patient symptoms of chest pain that is relieved with nitroglycerin, does not appear to be from cardiac etiology.  Will recommend performing a routine treadmill exercise stress test to see if there is any inducible ST  segment changes.  Otherwise I have reassured the patient.  50 mL contrast utilized.   EKG   EKG 08/16/2021: Normal sinus rhythm at rate of 69 bpm, left bundle branch block.  No further analysis.  No change from 07/04/2021 and 05/09/2021.  Assessment     ICD-10-CM   1. Coronary artery disease of native artery of native heart with stable angina pectoris ([place])  I25.118 EKG 12-Lead    2. Primary hypertension  I10     3. Hypercholesteremia  E78.00       No orders of the defined types were placed in this encounter.   There are no discontinued medications.   Recommendations:   Amanda Lambert  is a 75 y.o. patient with long-standing history of LBBB, hypertension, asymptomatic bilateral carotid stenosis presents for follow-up of angina pectoris.  Cardiac catheterization on 11/04/2019 had revealed a mid LAD 50% stenosis.    She underwent OCT guided PCI on 06/14/2021. Due to recurrent chest discomfort, underwent repeat cardiac catheterization on 07/19/2021 which revealed widely patent LAD and also previously placed stent.  She now presents for follow-up, she did undergo RFR and also CFR/IMR during the process and this revealed no significant microvascular disease as well.  Fortunately she is feeling well, she has not had any recurrence of angina pectoris since cardiac catheterization.  I discussed with her regarding potential for use of ranolazine in case she has recurrence of angina pectoris.  For now continue dual antiplatelet therapy for a total of 6 months from 06/14/2021 and she can discontinue this.  Blood pressure is well controlled and lipids are also at goal.  Stable from cardiac standpoint, I will see her back in 6 months for follow-up.     Adrian Prows, PA-C 08/16/2021, 10:57 AM Office: [phone]

## 2021-08-17 ENCOUNTER — Ambulatory Visit: Payer: Medicare Other | Admitting: Cardiology

## 2021-08-22 DIAGNOSIS — Z23 Encounter for immunization: Secondary | ICD-10-CM | POA: Diagnosis not present

## 2021-08-22 DIAGNOSIS — K5732 Diverticulitis of large intestine without perforation or abscess without bleeding: Secondary | ICD-10-CM | POA: Diagnosis not present

## 2021-08-22 DIAGNOSIS — R1030 Lower abdominal pain, unspecified: Secondary | ICD-10-CM | POA: Diagnosis not present

## 2021-08-30 ENCOUNTER — Other Ambulatory Visit: Payer: Self-pay | Admitting: Internal Medicine

## 2021-08-30 ENCOUNTER — Ambulatory Visit
Admission: RE | Admit: 2021-08-30 | Discharge: 2021-08-30 | Disposition: A | Discharge status: Home / Self Care | Payer: Medicare Other | Source: Ambulatory Visit | Attending: Internal Medicine | Admitting: Internal Medicine

## 2021-08-30 DIAGNOSIS — K5732 Diverticulitis of large intestine without perforation or abscess without bleeding: Secondary | ICD-10-CM

## 2021-08-30 DIAGNOSIS — I700 Atherosclerosis of aorta: Secondary | ICD-10-CM | POA: Diagnosis not present

## 2021-08-30 DIAGNOSIS — R1030 Lower abdominal pain, unspecified: Secondary | ICD-10-CM | POA: Diagnosis not present

## 2021-08-30 DIAGNOSIS — R100 Acute abdomen: Secondary | ICD-10-CM | POA: Diagnosis not present

## 2021-08-30 DIAGNOSIS — K8689 Other specified diseases of pancreas: Secondary | ICD-10-CM | POA: Diagnosis not present

## 2021-08-30 DIAGNOSIS — K5730 Diverticulosis of large intestine without perforation or abscess without bleeding: Secondary | ICD-10-CM | POA: Diagnosis not present

## 2021-08-30 DIAGNOSIS — K449 Diaphragmatic hernia without obstruction or gangrene: Secondary | ICD-10-CM | POA: Diagnosis not present

## 2021-08-30 MED ORDER — IOPAMIDOL (ISOVUE-300) INJECTION 61%
100.0000 mL | Freq: Once | INTRAVENOUS | Status: AC | PRN
  Administered 2021-08-30: 100 mL via INTRAVENOUS

## 2021-09-05 DIAGNOSIS — R1030 Lower abdominal pain, unspecified: Secondary | ICD-10-CM | POA: Diagnosis not present

## 2021-10-13 DIAGNOSIS — K589 Irritable bowel syndrome without diarrhea: Secondary | ICD-10-CM | POA: Diagnosis not present

## 2021-10-13 DIAGNOSIS — Z1211 Encounter for screening for malignant neoplasm of colon: Secondary | ICD-10-CM | POA: Diagnosis not present

## 2021-10-24 DIAGNOSIS — M25551 Pain in right hip: Secondary | ICD-10-CM | POA: Diagnosis not present

## 2021-10-25 DIAGNOSIS — Z1211 Encounter for screening for malignant neoplasm of colon: Secondary | ICD-10-CM | POA: Diagnosis not present

## 2021-11-21 DIAGNOSIS — M25511 Pain in right shoulder: Secondary | ICD-10-CM | POA: Diagnosis not present

## 2021-11-21 DIAGNOSIS — M7061 Trochanteric bursitis, right hip: Secondary | ICD-10-CM | POA: Diagnosis not present

## 2021-11-21 DIAGNOSIS — M542 Cervicalgia: Secondary | ICD-10-CM | POA: Diagnosis not present

## 2021-11-21 DIAGNOSIS — M545 Low back pain, unspecified: Secondary | ICD-10-CM | POA: Diagnosis not present

## 2021-11-24 ENCOUNTER — Other Ambulatory Visit: Payer: Self-pay | Admitting: Cardiology

## 2021-12-19 DIAGNOSIS — M545 Low back pain, unspecified: Secondary | ICD-10-CM | POA: Diagnosis not present

## 2022-01-10 DIAGNOSIS — K5900 Constipation, unspecified: Secondary | ICD-10-CM | POA: Diagnosis not present

## 2022-01-10 DIAGNOSIS — R1084 Generalized abdominal pain: Secondary | ICD-10-CM | POA: Diagnosis not present

## 2022-01-10 DIAGNOSIS — K625 Hemorrhage of anus and rectum: Secondary | ICD-10-CM | POA: Diagnosis not present

## 2022-01-10 DIAGNOSIS — R197 Diarrhea, unspecified: Secondary | ICD-10-CM | POA: Diagnosis not present

## 2022-01-10 DIAGNOSIS — K580 Irritable bowel syndrome with diarrhea: Secondary | ICD-10-CM | POA: Diagnosis not present

## 2022-01-10 DIAGNOSIS — D649 Anemia, unspecified: Secondary | ICD-10-CM | POA: Diagnosis not present

## 2022-02-07 ENCOUNTER — Ambulatory Visit
Admission: EM | Admit: 2022-02-07 | Discharge: 2022-02-07 | Disposition: A | Discharge status: Home / Self Care | Payer: Medicare Other | Attending: Emergency Medicine | Admitting: Emergency Medicine

## 2022-02-07 DIAGNOSIS — N390 Urinary tract infection, site not specified: Secondary | ICD-10-CM | POA: Diagnosis not present

## 2022-02-07 DIAGNOSIS — R81 Glycosuria: Secondary | ICD-10-CM | POA: Diagnosis not present

## 2022-02-07 DIAGNOSIS — R319 Hematuria, unspecified: Secondary | ICD-10-CM | POA: Insufficient documentation

## 2022-02-07 DIAGNOSIS — I10 Essential (primary) hypertension: Secondary | ICD-10-CM | POA: Diagnosis not present

## 2022-02-07 LAB — POCT URINALYSIS DIP (MANUAL ENTRY)
Glucose, UA: 250 mg/dL — AB
Nitrite, UA: POSITIVE — AB
Protein Ur, POC: 100 mg/dL — AB
Spec Grav, UA: 1.01 (ref 1.010–1.025)
Urobilinogen, UA: 4 E.U./dL — AB
pH, UA: 5 (ref 5.0–8.0)

## 2022-02-07 LAB — POCT FASTING CBG KUC MANUAL ENTRY: POCT Glucose (KUC): 130 mg/dL — AB (ref 70–99)

## 2022-02-07 MED ORDER — CEPHALEXIN 500 MG PO CAPS: 500.0000 mg | ORAL_CAPSULE | Freq: Two times a day (BID) | ORAL | 0 refills | Status: AC

## 2022-02-07 NOTE — ED Triage Notes (Signed)
Patient presents to Urgent Care with complaints of urinary freq, dysuria,and urgency since last night.  ? ?Treating symptoms with AZO.  ?

## 2022-02-07 NOTE — Discharge Instructions (Addendum)
Take the antibiotic as directed.  The urine culture is pending.  We will call you if it shows the need to change or discontinue your antibiotic.   ? ?Your blood pressure was 124/93.  Follow up with your primary care provider for a recheck of your urine and blood pressure.   ? ?

## 2022-02-07 NOTE — ED Provider Notes (Signed)
?[facility] ? ? ? ?CSN: [id number] ?Arrival date & time: 02/07/22  0909 ? ? ?  ? ?History   ?Chief Complaint ?Chief Complaint  ?Patient presents with  ? Urinary Frequency  ? Dysuria  ? ? ?HPI ?Amanda Lambert is a 76 y.o. female.  Patient presents with dysuria, urinary frequency, urinary urgency since yesterday.  No fever, chills, abdominal pain, flank pain, pelvic pain, or other symptoms.  Treatment at home with Azo.  Her medical history includes hypertension, CAD, cardiac stents, GERD, IBS, anemia.  ? ?The history is provided by the patient and medical records.  ? ?Past Medical History:  ?Diagnosis Date  ? Arthritis   ? right elbow  ? Biliary dyskinesia   ? Constipation   ? GERD (gastroesophageal reflux disease)   ? Hepatitis B 1975  ? Hypertension   ? Nausea   ? PONV (postoperative nausea and vomiting)   ? slow to wake up  ? [place] fever 04/01/2012  ? adm. to hospital  ? Syncope 10/17/2019  ? Weakness   ? ? ?Patient Active Problem List  ? Diagnosis Date Noted  ? Coronary artery disease of native artery of native heart with stable angina pectoris ([place]) 07/04/2021  ? Hypercholesteremia 07/04/2021  ? LAD stenosis 06/13/2021  ? Angina pectoris ([place]) 11/03/2019  ? Biliary dyskinesia 04/09/2012  ? Fever 04/01/2012  ? Transaminitis 04/01/2012  ? UTI (lower urinary tract infection) 04/01/2012  ? Leukopenia 04/01/2012  ? Thrombocytopenia ([place]) 04/01/2012  ? HTN (hypertension) 04/01/2012  ? GERD (gastroesophageal reflux disease) 04/01/2012  ? Oral thrush 04/01/2012  ? ? ?Past Surgical History:  ?Procedure Laterality Date  ? APPENDECTOMY  1963  ? CHOLECYSTECTOMY  04/26/2012  ? Procedure: LAPAROSCOPIC CHOLECYSTECTOMY WITH INTRAOPERATIVE CHOLANGIOGRAM;  Surgeon: Merrie Roof, MD;  Location: [facility];  Service: General;  Laterality: N/A;  ? CORONARY ANGIOGRAPHY N/A 07/19/2021  ? Procedure: CORONARY ANGIOGRAPHY (CATH LAB);  Surgeon: Adrian Prows, MD;  Location: [place] [facility];  Service: Cardiovascular;   Laterality: N/A;  ? CORONARY STENT INTERVENTION N/A 06/14/2021  ? Procedure: CORONARY STENT INTERVENTION;  Surgeon: Adrian Prows, MD;  Location: [place] [facility];  Service: Cardiovascular;  Laterality: N/A;  ? [place] OF UTERUS  1982  ? for miscarriage  ? DILATION AND CURETTAGE, DIAGNOSTIC / THERAPEUTIC  1972  ? INTRAVASCULAR IMAGING/OCT N/A 06/14/2021  ? Procedure: INTRAVASCULAR IMAGING/OCT;  Surgeon: Adrian Prows, MD;  Location: [place] [facility];  Service: Cardiovascular;  Laterality: N/A;  ? INTRAVASCULAR PRESSURE WIRE/FFR STUDY N/A 07/19/2021  ? Procedure: INTRAVASCULAR PRESSURE WIRE/FFR STUDY;  Surgeon: Adrian Prows, MD;  Location: [place] [facility];  Service: Cardiovascular;  Laterality: N/A;  ? LEFT HEART CATH AND CORONARY ANGIOGRAPHY N/A 11/04/2019  ? Procedure: LEFT HEART CATH AND CORONARY ANGIOGRAPHY;  Surgeon: Adrian Prows, MD;  Location: [place] [facility];  Service: Cardiovascular;  Laterality: N/A;  ? LEFT HEART CATH AND CORONARY ANGIOGRAPHY N/A 06/14/2021  ? Procedure: LEFT HEART CATH AND CORONARY ANGIOGRAPHY;  Surgeon: Adrian Prows, MD;  Location: [place] [facility];  Service: Cardiovascular;  Laterality: N/A;  ? TONSILLECTOMY  1964  - approximate  ? WRIST FRACTURE SURGERY  2008  ? right  ? ? ?OB History   ?No obstetric history on file. ?  ? ? ? ?Home Medications   ? ?Prior to Admission medications   ?Medication Sig Start Date End Date Taking? Authorizing Provider  ?cephALEXin (KEFLEX) 500 MG capsule Take 1 capsule (500  mg total) by mouth 2 (two) times daily for 5 days. 02/07/22 02/12/22 Yes Sharion Balloon, NP  ?aspirin EC 81 MG tablet Take 1 tablet (81 mg total) by mouth daily. 10/13/19   Adrian Prows, MD  ?clopidogrel (PLAVIX) 75 MG tablet TAKE 1 TABLET BY MOUTH EVERY DAY 08/08/21   Adrian Prows, MD  ?Cyanocobalamin (VITAMIN B-12 PO) Take 1 tablet by mouth in the morning.    [provider]  ?EPINEPHrine 0.3 mg/0.3 mL IJ SOAJ injection Inject 0.3 mg into the muscle once as needed for  anaphylaxis.    [provider]  ?esomeprazole (NEXIUM) 20 MG capsule Take 40 mg by mouth in the morning.    [provider]  ?furosemide (LASIX) 20 MG tablet TAKE 1 TABLET (20 MG TOTAL) BY MOUTH DAILY AS NEEDED FOR EDEMA. ?Patient taking differently: Take 40 mg by mouth daily. 04/19/21   Adrian Prows, MD  ?gabapentin (NEURONTIN) 300 MG capsule Take 300 mg by mouth at bedtime. 02/14/21   [provider]  ?isosorbide dinitrate (ISORDIL) 20 MG tablet Take 1 tablet (20 mg total) by mouth 3 (three) times daily. ?Patient taking differently: Take 40 mg by mouth in the morning and at bedtime. 05/23/21   Adrian Prows, MD  ?labetalol (NORMODYNE) 200 MG tablet TAKE 1 TABLET BY MOUTH TWICE A DAY 11/24/21   Adrian Prows, MD  ?nitroGLYCERIN (NITROSTAT) 0.4 MG SL tablet Place 1 tablet (0.4 mg total) under the tongue every 5 (five) minutes as needed for up to 25 days for chest pain. 10/13/19 07/15/24  Adrian Prows, MD  ?rosuvastatin (CRESTOR) 10 MG tablet TAKE 1 TABLET BY MOUTH EVERY DAY 07/20/21   Adrian Prows, MD  ?sodium chloride (OCEAN) 0.65 % SOLN nasal spray Place 1 spray into both nostrils as needed for congestion.    [provider]  ?triamterene-hydrochlorothiazide (MAXZIDE-25) 37.5-25 MG tablet Take 1 tablet by mouth in the morning. 01/22/17   [provider]  ?VITAMIN D PO Take 1 capsule by mouth every other day. In the morning    [provider]  ? ? ?Family History ?Family History  ?Problem Relation Age of Onset  ? Cardiomyopathy Mother   ? Coronary artery disease Father   ? Hypertension Brother   ? Prostate cancer Brother   ? ? ?Social History ?Social History  ? ?Tobacco Use  ? Smoking status: Never  ? Smokeless tobacco: Never  ?Vaping Use  ? Vaping Use: Never used  ?Substance Use Topics  ? Alcohol use: No  ? Drug use: No  ? ? ? ?Allergies   ?Ace inhibitors, Bee venom, Thorazine [chlorpromazine], Wasp venom, Levaquin [levofloxacin in d5w], Shrimp [shellfish allergy], Amlodipine,  Atropine sulfate, Elemental sulfur, Glycopyrrolate, Ramipril, Sulfa antibiotics, Compazine [prochlorperazine edisylate], and Latex ? ? ?Review of Systems ?Review of Systems  ?Constitutional:  Negative for chills and fever.  ?Respiratory:  Negative for cough and shortness of breath.   ?Cardiovascular:  Negative for chest pain and palpitations.  ?Gastrointestinal:  Negative for abdominal pain, nausea and vomiting.  ?Genitourinary:  Positive for dysuria, frequency and urgency. Negative for flank pain and hematuria.  ?Skin:  Negative for color change and rash.  ?All other systems reviewed and are negative. ? ? ?Physical Exam ?Triage Vital Signs ?ED Triage Vitals  ?Enc Vitals Group  ?   BP 02/07/22 1025 (!) 124/93  ?   Pulse Rate 02/07/22 1025 73  ?   Resp 02/07/22 1025 18  ?   Temp 02/07/22 1025 98.2 ?F (  36.8 ?C)  ?   Temp src --   ?   SpO2 02/07/22 1025 97 %  ?   Weight --   ?   Height --   ?   Head Circumference --   ?   Peak Flow --   ?   Pain Score 02/07/22 1031 3  ?   Pain Loc --   ?   Pain Edu? --   ?   Excl. in [place]? --   ? ?No data found. ? ?Updated Vital Signs ?BP (!) 124/93   Pulse 73   Temp 98.2 ?F (36.8 ?C)   Resp 18   SpO2 97%  ? ?Visual Acuity ?Right Eye Distance:   ?Left Eye Distance:   ?Bilateral Distance:   ? ?Right Eye Near:   ?Left Eye Near:    ?Bilateral Near:    ? ?Physical Exam ?Vitals and nursing note reviewed.  ?Constitutional:   ?   General: She is not in acute distress. ?   Appearance: Normal appearance. She is well-developed. She is not ill-appearing.  ?HENT:  ?   Mouth/Throat:  ?   Mouth: Mucous membranes are moist.  ?Cardiovascular:  ?   Rate and Rhythm: Normal rate and regular rhythm.  ?   Heart sounds: Normal heart sounds.  ?Pulmonary:  ?   Effort: Pulmonary effort is normal. No respiratory distress.  ?   Breath sounds: Normal breath sounds.  ?Abdominal:  ?   General: Bowel sounds are normal.  ?   Palpations: Abdomen is soft.  ?   Tenderness: There is no abdominal tenderness. There is no  right CVA tenderness, left CVA tenderness, guarding or rebound.  ?Musculoskeletal:  ?   Cervical back: Neck supple.  ?Skin: ?   General: Skin is warm and dry.  ?Neurological:  ?   Mental Status: She is alert.  ?

## 2022-02-08 DIAGNOSIS — E559 Vitamin D deficiency, unspecified: Secondary | ICD-10-CM | POA: Diagnosis not present

## 2022-02-08 DIAGNOSIS — Z0000 Encounter for general adult medical examination without abnormal findings: Secondary | ICD-10-CM | POA: Diagnosis not present

## 2022-02-08 DIAGNOSIS — Z23 Encounter for immunization: Secondary | ICD-10-CM | POA: Diagnosis not present

## 2022-02-08 DIAGNOSIS — G250 Essential tremor: Secondary | ICD-10-CM | POA: Diagnosis not present

## 2022-02-08 DIAGNOSIS — Z1239 Encounter for other screening for malignant neoplasm of breast: Secondary | ICD-10-CM | POA: Diagnosis not present

## 2022-02-08 DIAGNOSIS — I10 Essential (primary) hypertension: Secondary | ICD-10-CM | POA: Diagnosis not present

## 2022-02-08 DIAGNOSIS — Z1331 Encounter for screening for depression: Secondary | ICD-10-CM | POA: Diagnosis not present

## 2022-02-08 DIAGNOSIS — K580 Irritable bowel syndrome with diarrhea: Secondary | ICD-10-CM | POA: Diagnosis not present

## 2022-02-08 DIAGNOSIS — K219 Gastro-esophageal reflux disease without esophagitis: Secondary | ICD-10-CM | POA: Diagnosis not present

## 2022-02-08 DIAGNOSIS — I25119 Atherosclerotic heart disease of native coronary artery with unspecified angina pectoris: Secondary | ICD-10-CM | POA: Diagnosis not present

## 2022-02-08 DIAGNOSIS — I779 Disorder of arteries and arterioles, unspecified: Secondary | ICD-10-CM | POA: Diagnosis not present

## 2022-02-08 DIAGNOSIS — K5900 Constipation, unspecified: Secondary | ICD-10-CM | POA: Diagnosis not present

## 2022-02-08 DIAGNOSIS — G2581 Restless legs syndrome: Secondary | ICD-10-CM | POA: Diagnosis not present

## 2022-02-09 LAB — URINE CULTURE: Culture: >=100000 — AB

## 2022-02-14 DIAGNOSIS — D509 Iron deficiency anemia, unspecified: Secondary | ICD-10-CM | POA: Diagnosis not present

## 2022-02-14 DIAGNOSIS — K589 Irritable bowel syndrome without diarrhea: Secondary | ICD-10-CM | POA: Diagnosis not present

## 2022-02-16 ENCOUNTER — Ambulatory Visit: Payer: Medicare Other | Admitting: Cardiology

## 2022-02-16 ENCOUNTER — Encounter: Payer: Self-pay | Admitting: Cardiology

## 2022-02-16 VITALS — BP 130/69 | HR 77 | Temp 98.0°F | Resp 16 | Ht 67.0 in | Wt 197.0 lb

## 2022-02-16 DIAGNOSIS — I25118 Atherosclerotic heart disease of native coronary artery with other forms of angina pectoris: Secondary | ICD-10-CM

## 2022-02-16 DIAGNOSIS — N1831 Chronic kidney disease, stage 3a: Secondary | ICD-10-CM | POA: Diagnosis not present

## 2022-02-16 DIAGNOSIS — I10 Essential (primary) hypertension: Secondary | ICD-10-CM | POA: Diagnosis not present

## 2022-02-16 DIAGNOSIS — G250 Essential tremor: Secondary | ICD-10-CM

## 2022-02-16 DIAGNOSIS — E7800 Pure hypercholesterolemia, unspecified: Secondary | ICD-10-CM

## 2022-02-16 DIAGNOSIS — I6523 Occlusion and stenosis of bilateral carotid arteries: Secondary | ICD-10-CM | POA: Diagnosis not present

## 2022-02-16 MED ORDER — PROPRANOLOL HCL 20 MG PO TABS: 20.0000 mg | ORAL_TABLET | Freq: Three times a day (TID) | ORAL | 2 refills | Status: DC

## 2022-02-16 NOTE — Progress Notes (Signed)
 Primary Physician/Referring:  Gates, Robert, MD  Patient ID: Amanda Lambert, female    DOB: 12/07/1945, 76 y.o.   MRN: [id number]  Chief Complaint  Patient presents with   Coronary Artery Disease   Hypertension   Hyperlipidemia   Follow-up    6 month    HPI:    Amanda Lambert  is a 76 y.o. 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  Fortunately she has not had angina pectoris and has not used sublingual nitroglycerin in several months.  She is feeling the best she has in quite a while.  She is tolerating all medications without side effects.  Past Medical History:  Diagnosis Date   Arthritis    right elbow   Biliary dyskinesia    Constipation    GERD (gastroesophageal reflux disease)    Hepatitis B 1975   Hypertension    Nausea    PONV (postoperative nausea and vomiting)    slow to wake up   [place] spotted fever 04/01/2012   adm. to hospital   Syncope 10/17/2019   Weakness    Social History   Tobacco Use   Smoking status: Never   Smokeless tobacco: Never  Substance Use Topics   Alcohol use: No   Marital Status: Widowed ROS  Review of Systems  Cardiovascular:  Negative for chest pain, claudication, dyspnea on exertion, leg swelling, orthopnea and palpitations.  Gastrointestinal:  Negative for melena.  Objective  Blood pressure 130/69, pulse 77, temperature 98 F (36.7 C), resp. rate 16, height 5' 7" (1.702 m), weight 197 lb (89.4 kg), SpO2 98 %.     02/16/2022   11:10 AM 02/07/2022   10:25 AM 08/16/2021   10:46 AM  Vitals with BMI  Height 5' 7"  5' 7"  Weight 197 lbs  198 lbs  BMI 30.85  31  Systolic 130 124 133  Diastolic 69 93 73  Pulse 77 73 73     Physical Exam Vitals reviewed.  Constitutional:      Comments: Well-built and mildly obese in no acute  distress.  Neck:     Vascular: No carotid bruit or JVD.  Cardiovascular:     Rate and Rhythm: Normal rate and regular rhythm.     Pulses: Normal pulses and intact distal pulses.          Carotid pulses are  on the right side with bruit.    Heart sounds: Normal heart sounds, S1 normal and S2 normal. No murmur heard.   No gallop.  Pulmonary:     Effort: Pulmonary effort is normal. No respiratory distress.     Breath sounds: Normal breath sounds. No wheezing, rhonchi or rales.  Musculoskeletal:     Right lower leg: No edema.     Left lower leg: No edema.   Laboratory examination:   External Labs:  Labs 02/08/2022:  Hb 11.4/HCT 33.8, platelets 287, normal indicis.  BUN 15, creatinine 1.18, EGFR 48 mL (Stable), potassium 4.5, LFTs normal.  TSH 1.20.  Vitamin D 43.2.  Total cholesterol 149, triglycerides 89, HDL 90, LDL 43.  Labs 01/25/2021:  Hb 11.88/HCT 35.3, platelets 273, normal indicis.  Serum glucose 106 mg, BUN 12, creatinine 1.08, EGFR 54 mL, potassium 3.8, CMP otherwise normal.  Total cholesterol 131, triglycerides 105, HDL 71, LDL 41. Allergies   Allergies    Allergen Reactions   Ace Inhibitors Anaphylaxis    Angioedema.   Bee Venom Anaphylaxis and Other (See Comments)   Thorazine [Chlorpromazine] Anaphylaxis   Wasp Venom Anaphylaxis   Levaquin [Levofloxacin In D5w] Nausea And Vomiting    Stated by patient she could not handle levaquin even with antiemetics    Shrimp [Shellfish Allergy] Rash and Hives    "splotches"    Amlodipine     severe leg edema   Atropine Sulfate Other (See Comments)   Elemental Sulfur Itching        Glycopyrrolate Other (See Comments)   Ramipril Other (See Comments)   Sulfa Antibiotics Other (See Comments)   Compazine [Prochlorperazine Edisylate] Anxiety   Latex Rash    Final Medications at End of Visit     Current Outpatient Medications:    aspirin EC 81 MG tablet, Take 1 tablet (81 mg total) by mouth daily., Disp: 90 tablet,  Rfl: 3   Cyanocobalamin (VITAMIN B-12 PO), Take 1 tablet by mouth in the morning., Disp: , Rfl:    EPINEPHrine 0.3 mg/0.3 mL IJ SOAJ injection, Inject 0.3 mg into the muscle once as needed for anaphylaxis., Disp: , Rfl:    esomeprazole (NEXIUM) 20 MG capsule, Take 40 mg by mouth in the morning., Disp: , Rfl:    furosemide (LASIX) 20 MG tablet, TAKE 1 TABLET (20 MG TOTAL) BY MOUTH DAILY AS NEEDED FOR EDEMA. (Patient taking differently: Take 20 mg by mouth daily.), Disp: 90 tablet, Rfl: 3   gabapentin (NEURONTIN) 300 MG capsule, Take 300 mg by mouth at bedtime., Disp: , Rfl:    isosorbide dinitrate (ISORDIL) 20 MG tablet, Take 1 tablet (20 mg total) by mouth 3 (three) times daily. (Patient taking differently: Take 40 mg by mouth in the morning and at bedtime.), Disp: 270 tablet, Rfl: 3   labetalol (NORMODYNE) 200 MG tablet, TAKE 1 TABLET BY MOUTH TWICE A DAY (Patient taking differently: Take 100 mg by mouth 2 (two) times daily.), Disp: 180 tablet, Rfl: 1   magnesium 30 MG tablet, Take 30 mg by mouth 2 (two) times daily., Disp: , Rfl:    nitroGLYCERIN (NITROSTAT) 0.4 MG SL tablet, Place 1 tablet (0.4 mg total) under the tongue every 5 (five) minutes as needed for up to 25 days for chest pain., Disp: 25 tablet, Rfl: 3   Probiotic Product (ALIGN PO), Take by mouth., Disp: , Rfl:    propranolol (INDERAL) 20 MG tablet, Take 1 tablet (20 mg total) by mouth 3 (three) times daily., Disp: 90 tablet, Rfl: 2   rosuvastatin (CRESTOR) 10 MG tablet, TAKE 1 TABLET BY MOUTH EVERY DAY, Disp: 90 tablet, Rfl: 3   sodium chloride (OCEAN) 0.65 % SOLN nasal spray, Place 1 spray into both nostrils as needed for congestion., Disp: , Rfl:    triamterene-hydrochlorothiazide (MAXZIDE-25) 37.5-25 MG tablet, Take 1 tablet by mouth in the morning., Disp: , Rfl:    VITAMIN D PO, Take 1 capsule by mouth every other day. In the morning, Disp: , Rfl:   Radiology:  No results found.  Cardiac Studies:   Echocardiogram  10/15/2019: Left ventricle cavity is normal in size. Moderate concentric hypertrophy of the left ventricle. Normal global wall motion. Normal LV systolic function with visual EF 50-55%. Doppler evidence of grade I (impaired) diastolic dysfunction, normal LAP. Left atrial cavity is mildly dilated. Moderate (Grade II) mitral regurgitation. Structurally normal tricuspid valve.  Mild to moderate tricuspid regurgitation. No evidence of pulmonary hypertension.  Lexiscan (Walking with  mod Bruce)Tetrofosmin Stress Test  10/20/2019: Nondiagnostic ECG stress. Mild degree large extent fixed perfusion defect located in the mid anteroseptal wall, mid inferoseptal wall, basal anteroseptal wall and basal inferoseptal wall. This is consistent with LBBB, mild ischemia cannot be completely excluded.  All segments of left ventricle demonstrated normal wall motion and thickening. Stress LV EF is normal 61%.  Low risk. No previous exam available for comparison.  Carotid artery duplex 11/03/2020: Doppler velocity suggests stenosis in the right internal carotid artery (50-69%). Doppler velocity suggests stenosis in the left internal carotid artery (16-49%). There is moderate amount of homogenous plaque bilaterally. Antegrade right vertebral artery flow. Antegrade left vertebral artery flow. Follow up in six months is appropriate if clinically indicated. No significant change from 04/06/2020.  Left Heart Catheterization 07/19/21: LM: Smooth and normal. CX: Smooth and normal. LAD: Large vessel, gives origin to large D1.  Just after the origin of large D1 and a large septal perforator, there is a stent that was previously placed 06/14/2021, 3.0 x 30 mm Onyx is widely patent.  Prior to the stent, the proximal segment at the bifurcation of D1 and large septal perforator, there is a 30" 40% stenosis. RFR was 0.92, FFR 0.89, CFR 4.8, IMR 18.  Findings do not suggest macrovascular or microvascular disease. RCA: Not studied,  previously normal.  EKG   EKG 02/16/2022: Normal sinus rhythm at rate of 63 bpm, left bundle branch block.  No further analysis.  No change from 08/16/2021.  Assessment     ICD-10-CM   1. Coronary artery disease of native artery of native heart with stable angina pectoris (HCC)  I25.118 EKG 12-Lead    2. Primary hypertension  I10     3. Asymptomatic bilateral carotid artery stenosis  I65.23 PCV CAROTID DUPLEX (BILATERAL)    4. Benign essential tremor  G25.0 propranolol (INDERAL) 20 MG tablet    5. Hypercholesteremia  E78.00     6. Stage 3a chronic kidney disease (HCC)  N18.31       Meds ordered this encounter  Medications   propranolol (INDERAL) 20 MG tablet    Sig: Take 1 tablet (20 mg total) by mouth 3 (three) times daily.    Dispense:  90 tablet    Refill:  2    Patient is on Labetalol for hypertension.    Medications Discontinued During This Encounter  Medication Reason   clopidogrel (PLAVIX) 75 MG tablet      Recommendations:   Kaleigha U Rogalski  is a 76 y.o. 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  Patient fortunately has not had microvascular angina or anginal related to exertional activity, she has been fairly busy and active in her yard work.  Last sublingual nitroglycerin was many months ago.  Physical examination is unchanged.  Blood pressure is well controlled, lipids are at goal.  No clinical evidence of heart failure.  She has chronic left bundle branch block but preserved LVEF.  External labs reviewed.  Stage III chronic kidney disease also stable.  Her benign essential tremors are getting slightly worse, she would benefit from nonselective beta-blocker therapy however she is concerned about fatigue and drowsiness related to beta-blocker use.  Hence continued  labetalol that has been used for both hypertension control CAD but will add a low-dose of propranolol 20 mg p.o. 3 times daily.  If   she notices improvement in her tremors, I could certainly do long-term Rx.  Otherwise stable from cardiac standpoint, she needs carotid artery duplex surveillance which will be set up.  I will see her back on an annual basis.    , PA-C 02/16/2022, 11:41 AM Office: [phone] 

## 2022-03-01 ENCOUNTER — Ambulatory Visit: Payer: Medicare Other

## 2022-03-01 DIAGNOSIS — I6523 Occlusion and stenosis of bilateral carotid arteries: Secondary | ICD-10-CM | POA: Diagnosis not present

## 2022-03-11 ENCOUNTER — Other Ambulatory Visit: Payer: Self-pay | Admitting: Cardiology

## 2022-03-11 DIAGNOSIS — R600 Localized edema: Secondary | ICD-10-CM

## 2022-04-04 ENCOUNTER — Other Ambulatory Visit: Payer: Self-pay | Admitting: Cardiology

## 2022-04-04 DIAGNOSIS — I209 Angina pectoris, unspecified: Secondary | ICD-10-CM

## 2022-05-08 ENCOUNTER — Ambulatory Visit
Admission: RE | Admit: 2022-05-08 | Discharge: 2022-05-08 | Disposition: A | Discharge status: Home / Self Care | Payer: Medicare Other | Source: Ambulatory Visit | Attending: Family Medicine | Admitting: Family Medicine

## 2022-05-08 ENCOUNTER — Other Ambulatory Visit: Payer: Self-pay

## 2022-05-08 VITALS — BP 122/61 | HR 66 | Temp 98.4°F | Resp 18

## 2022-05-08 DIAGNOSIS — R300 Dysuria: Secondary | ICD-10-CM | POA: Diagnosis not present

## 2022-05-08 DIAGNOSIS — N390 Urinary tract infection, site not specified: Secondary | ICD-10-CM | POA: Insufficient documentation

## 2022-05-08 DIAGNOSIS — L249 Irritant contact dermatitis, unspecified cause: Secondary | ICD-10-CM | POA: Insufficient documentation

## 2022-05-08 LAB — POCT URINALYSIS DIP (MANUAL ENTRY)
Bilirubin, UA: NEGATIVE
Glucose, UA: 100 mg/dL — AB
Ketones, POC UA: NEGATIVE mg/dL
Nitrite, UA: POSITIVE — AB
Spec Grav, UA: 1.01 (ref 1.010–1.025)
Urobilinogen, UA: 1 E.U./dL
pH, UA: 6 (ref 5.0–8.0)

## 2022-05-08 MED ORDER — TRIAMCINOLONE ACETONIDE 0.025 % EX CREA: 1.0000 | TOPICAL_CREAM | Freq: Two times a day (BID) | CUTANEOUS | 0 refills | Status: DC | PRN

## 2022-05-08 MED ORDER — DEXAMETHASONE SODIUM PHOSPHATE 10 MG/ML IJ SOLN
10.0000 mg | Freq: Once | INTRAMUSCULAR | Status: AC
  Administered 2022-05-08: 10 mg via INTRAMUSCULAR

## 2022-05-08 MED ORDER — CEPHALEXIN 500 MG PO CAPS: 500.0000 mg | ORAL_CAPSULE | Freq: Two times a day (BID) | ORAL | 0 refills | Status: AC

## 2022-05-08 MED ORDER — PREDNISONE 20 MG PO TABS: 20.0000 mg | ORAL_TABLET | Freq: Every day | ORAL | 0 refills | Status: AC

## 2022-05-08 NOTE — ED Provider Notes (Signed)
[name]    CSN: [id number] Arrival date & time: 05/08/22  1129      History   Chief Complaint Chief Complaint  Patient presents with  . Urinary Frequency    Suspect UTI additionally have rash. - Entered by patient  . Urinary Tract Infection  . Dysuria  . Rash    HPI Amanda Lambert is a 76 y.o. female.   HPI Amanda Lambert is a 76 y.o. female presents for evaluation of urinary frequency, urgency and dysuria x 4 days, without flank pain, fever, chills, or abnormal vaginal discharge or bleeding. She has taken AZO for symptoms with temporal relief. Recently treated for UTI here at [facility] back in May which resolved with Keflex. Patient also is concerned regarding a itchy rash that initially developed all over back has not resolve with low dose of prednisone and is now expanding to her abdomen and lower extremities. She reports having indoor dog and frequently engaging in yard work. She is uncertain if she has made contact with an irritant.  No LMP recorded. Patient is postmenopausal.  Past Medical History:  Diagnosis Date  . Arthritis    right elbow  . Biliary dyskinesia   . Constipation   . GERD (gastroesophageal reflux disease)   . Hepatitis B 1975  . Hypertension   . Nausea   . PONV (postoperative nausea and vomiting)    slow to wake up  . [place] spotted fever 04/01/2012   adm. to hospital  . Syncope 10/17/2019  . Weakness     Patient Active Problem List   Diagnosis Date Noted  . Coronary artery disease of native artery of native heart with stable angina pectoris ([place]) 07/04/2021  . Hypercholesteremia 07/04/2021  . LAD stenosis 06/13/2021  . Angina pectoris ([place]) 11/03/2019  . Biliary dyskinesia 04/09/2012  . Fever 04/01/2012  . Transaminitis 04/01/2012  . UTI (lower urinary tract infection) 04/01/2012  . Leukopenia 04/01/2012  . Thrombocytopenia ([place]) 04/01/2012  . HTN (hypertension) 04/01/2012  . GERD (gastroesophageal reflux disease)  04/01/2012  . Oral thrush 04/01/2012    Past Surgical History:  Procedure Laterality Date  . APPENDECTOMY  1963  . CHOLECYSTECTOMY  04/26/2012   Procedure: LAPAROSCOPIC CHOLECYSTECTOMY WITH INTRAOPERATIVE CHOLANGIOGRAM;  Surgeon: Merrie Roof, MD;  Location: [facility];  Service: General;  Laterality: N/A;  . CORONARY ANGIOGRAPHY N/A 07/19/2021   Procedure: CORONARY ANGIOGRAPHY (CATH LAB);  Surgeon: Adrian Prows, MD;  Location: [facility];  Service: Cardiovascular;  Laterality: N/A;  . CORONARY STENT INTERVENTION N/A 06/14/2021   Procedure: CORONARY STENT INTERVENTION;  Surgeon: Adrian Prows, MD;  Location: [facility];  Service: Cardiovascular;  Laterality: N/A;  . DILATION AND CURETTAGE OF UTERUS  1982   for miscarriage  . DILATION AND CURETTAGE, DIAGNOSTIC / THERAPEUTIC  1972  . INTRAVASCULAR IMAGING/OCT N/A 06/14/2021   Procedure: INTRAVASCULAR IMAGING/OCT;  Surgeon: Adrian Prows, MD;  Location: [facility];  Service: Cardiovascular;  Laterality: N/A;  . INTRAVASCULAR PRESSURE WIRE/FFR STUDY N/A 07/19/2021   Procedure: INTRAVASCULAR PRESSURE WIRE/FFR STUDY;  Surgeon: Adrian Prows, MD;  Location: [facility];  Service: Cardiovascular;  Laterality: N/A;  . LEFT HEART CATH AND CORONARY ANGIOGRAPHY N/A 11/04/2019   Procedure: LEFT HEART CATH AND CORONARY ANGIOGRAPHY;  Surgeon: Adrian Prows, MD;  Location: [facility];  Service: Cardiovascular;  Laterality: N/A;  . LEFT HEART CATH AND CORONARY ANGIOGRAPHY N/A 06/14/2021   Procedure: LEFT HEART CATH AND CORONARY  ANGIOGRAPHY;  Surgeon: Adrian Prows, MD;  Location: [facility];  Service: Cardiovascular;  Laterality: N/A;  . TONSILLECTOMY  1964  - approximate  . WRIST FRACTURE SURGERY  2008   right    OB History   No obstetric history on file.      Home Medications    Prior to Admission medications   Medication Sig Start Date End Date Taking? Authorizing Provider  aspirin EC 81 MG tablet Take 1 tablet (81 mg  total) by mouth daily. 10/13/19   Adrian Prows, MD  Cyanocobalamin (VITAMIN B-12 PO) Take 1 tablet by mouth in the morning.    [provider]  EPINEPHrine 0.3 mg/0.3 mL IJ SOAJ injection Inject 0.3 mg into the muscle once as needed for anaphylaxis.    [provider]  esomeprazole (NEXIUM) 20 MG capsule Take 40 mg by mouth in the morning.    [provider]  furosemide (LASIX) 20 MG tablet TAKE 1 TABLET (20 MG TOTAL) BY MOUTH DAILY AS NEEDED FOR EDEMA. 03/13/22   Adrian Prows, MD  gabapentin (NEURONTIN) 300 MG capsule Take 300 mg by mouth at bedtime. 02/14/21   [provider]  isosorbide dinitrate (ISORDIL) 20 MG tablet TAKE 1 TABLET BY MOUTH THREE TIMES A DAY 04/05/22   Adrian Prows, MD  labetalol (NORMODYNE) 200 MG tablet TAKE 1 TABLET BY MOUTH TWICE A DAY Patient taking differently: Take 100 mg by mouth 2 (two) times daily. 11/24/21   Adrian Prows, MD  magnesium 30 MG tablet Take 30 mg by mouth 2 (two) times daily.    [provider]  nitroGLYCERIN (NITROSTAT) 0.4 MG SL tablet Place 1 tablet (0.4 mg total) under the tongue every 5 (five) minutes as needed for up to 25 days for chest pain. 10/13/19 07/15/24  Adrian Prows, MD  Probiotic Product (ALIGN PO) Take by mouth.    [provider]  propranolol (INDERAL) 20 MG tablet Take 1 tablet (20 mg total) by mouth 3 (three) times daily. 02/16/22   Adrian Prows, MD  rosuvastatin (CRESTOR) 10 MG tablet TAKE 1 TABLET BY MOUTH EVERY DAY 07/20/21   Adrian Prows, MD  sodium chloride (OCEAN) 0.65 % SOLN nasal spray Place 1 spray into both nostrils as needed for congestion.    [provider]  triamterene-hydrochlorothiazide (MAXZIDE-25) 37.5-25 MG tablet Take 1 tablet by mouth in the morning. 01/22/17   [provider]  VITAMIN D PO Take 1 capsule by mouth every other day. In the morning    [provider]    Family History Family History  Problem Relation Age of Onset  . Cardiomyopathy Mother    . Coronary artery disease Father   . Hypertension Brother   . Prostate cancer Brother     Social History Social History   Tobacco Use  . Smoking status: Never  . Smokeless tobacco: Never  Vaping Use  . Vaping Use: Never used  Substance Use Topics  . Alcohol use: No  . Drug use: No     Allergies   Ace inhibitors, Bee venom, Thorazine [chlorpromazine], Wasp venom, Levaquin [levofloxacin in d5w], Shrimp [shellfish allergy], Amlodipine, Atropine sulfate, Elemental sulfur, Glycopyrrolate, Ramipril, Sulfa antibiotics, Compazine [prochlorperazine edisylate], and Latex   Review of Systems Review of Systems Pertinent negatives listed in HPI   Physical Exam Triage Vital Signs ED Triage Vitals  Enc Vitals Group     BP 05/08/22 1204 122/61     Pulse Rate 05/08/22 1204 66  Resp 05/08/22 1204 18     Temp 05/08/22 1204 98.4 F (36.9 C)     Temp src --      SpO2 05/08/22 1204 93 %     Weight --      Height --      Head Circumference --      Peak Flow --      Pain Score 05/08/22 1205 4     Pain Loc --      Pain Edu? --      Excl. in [place]? --    No data found.  Updated Vital Signs BP 122/61   Pulse 66   Temp 98.4 F (36.9 C)   Resp 18   SpO2 93%   Visual Acuity Right Eye Distance:   Left Eye Distance:   Bilateral Distance:    Right Eye Near:   Left Eye Near:    Bilateral Near:     Physical Exam   UC Treatments / Results  Labs (all labs ordered are listed, but only abnormal results are displayed) Labs Reviewed  POCT URINALYSIS DIP (MANUAL ENTRY) - Abnormal; Notable for the following components:      Result Value   Color, UA orange (*)    Clarity, UA cloudy (*)    Glucose, UA =100 (*)    Blood, UA small (*)    Protein Ur, POC trace (*)    Nitrite, UA Positive (*)    Leukocytes, UA Small (1+) (*)    All other components within normal limits  URINE CULTURE    EKG   Radiology No results found.  Procedures Procedures (including critical care  time)  Medications Ordered in UC Medications - No data to display  Initial Impression / Assessment and Plan / UC Course  I have reviewed the triage vital signs and the nursing notes.  Pertinent labs & imaging results that were available during my care of the patient were reviewed by me and considered in my medical decision making (see chart for details).     *** Final Clinical Impressions(s) / UC Diagnoses   Final diagnoses:  Dysuria   Discharge Instructions   None    ED Prescriptions   None    PDMP not reviewed this encounter.

## 2022-05-08 NOTE — Discharge Instructions (Addendum)
Your urine culture will result within 2- 3 days.  We will notify you if there is any changes in treatment based on urine culture results.  If you do not hear anything from our clinic continue with current treatment as prescribed.  Hydrate well with fluids.  If you develop any fever, nausea or vomiting or severe abdominal pain go immediately to the emergency department.

## 2022-05-08 NOTE — ED Triage Notes (Signed)
PT reports she has had dysuria and frequency since Friday the patient also has a rash on back,arms,and waist. Pt does work in yard and did work in the yard last week.

## 2022-05-10 LAB — URINE CULTURE
Culture: 80000 — AB
Special Requests: NORMAL

## 2022-05-20 ENCOUNTER — Other Ambulatory Visit: Payer: Self-pay | Admitting: Cardiology

## 2022-05-20 DIAGNOSIS — G250 Essential tremor: Secondary | ICD-10-CM

## 2022-06-29 DIAGNOSIS — M7061 Trochanteric bursitis, right hip: Secondary | ICD-10-CM | POA: Diagnosis not present

## 2022-07-03 ENCOUNTER — Other Ambulatory Visit: Payer: Self-pay | Admitting: Cardiology

## 2022-07-03 DIAGNOSIS — I6523 Occlusion and stenosis of bilateral carotid arteries: Secondary | ICD-10-CM

## 2022-07-03 DIAGNOSIS — I209 Angina pectoris, unspecified: Secondary | ICD-10-CM

## 2022-07-11 DIAGNOSIS — K319 Disease of stomach and duodenum, unspecified: Secondary | ICD-10-CM | POA: Diagnosis not present

## 2022-07-11 DIAGNOSIS — K3189 Other diseases of stomach and duodenum: Secondary | ICD-10-CM | POA: Diagnosis not present

## 2022-07-11 DIAGNOSIS — K5730 Diverticulosis of large intestine without perforation or abscess without bleeding: Secondary | ICD-10-CM | POA: Diagnosis not present

## 2022-07-11 DIAGNOSIS — D509 Iron deficiency anemia, unspecified: Secondary | ICD-10-CM | POA: Diagnosis not present

## 2022-07-11 DIAGNOSIS — D649 Anemia, unspecified: Secondary | ICD-10-CM | POA: Diagnosis not present

## 2022-07-13 DIAGNOSIS — K319 Disease of stomach and duodenum, unspecified: Secondary | ICD-10-CM | POA: Diagnosis not present

## 2022-08-10 DIAGNOSIS — D649 Anemia, unspecified: Secondary | ICD-10-CM | POA: Diagnosis not present

## 2022-08-10 DIAGNOSIS — K580 Irritable bowel syndrome with diarrhea: Secondary | ICD-10-CM | POA: Diagnosis not present

## 2022-08-10 DIAGNOSIS — R1084 Generalized abdominal pain: Secondary | ICD-10-CM | POA: Diagnosis not present

## 2022-08-10 DIAGNOSIS — I10 Essential (primary) hypertension: Secondary | ICD-10-CM | POA: Diagnosis not present

## 2022-08-19 ENCOUNTER — Ambulatory Visit
Admission: RE | Admit: 2022-08-19 | Discharge: 2022-08-19 | Disposition: A | Discharge status: Home / Self Care | Payer: Medicare Other | Source: Ambulatory Visit | Attending: Urgent Care | Admitting: Urgent Care

## 2022-08-19 VITALS — BP 148/83 | HR 65 | Temp 98.1°F | Resp 20

## 2022-08-19 DIAGNOSIS — R350 Frequency of micturition: Secondary | ICD-10-CM | POA: Diagnosis not present

## 2022-08-19 DIAGNOSIS — N3001 Acute cystitis with hematuria: Secondary | ICD-10-CM | POA: Insufficient documentation

## 2022-08-19 LAB — POCT URINALYSIS DIP (MANUAL ENTRY)
Bilirubin, UA: NEGATIVE
Glucose, UA: 100 mg/dL — AB
Ketones, POC UA: NEGATIVE mg/dL
Nitrite, UA: POSITIVE — AB
Protein Ur, POC: 30 mg/dL — AB
Spec Grav, UA: 1.015 (ref 1.010–1.025)
Urobilinogen, UA: 1 E.U./dL
pH, UA: 7 (ref 5.0–8.0)

## 2022-08-19 MED ORDER — CEPHALEXIN 500 MG PO CAPS: 500.0000 mg | ORAL_CAPSULE | Freq: Two times a day (BID) | ORAL | 0 refills | Status: DC

## 2022-08-19 MED ORDER — FLUCONAZOLE 150 MG PO TABS: 150.0000 mg | ORAL_TABLET | ORAL | 0 refills | Status: DC

## 2022-08-19 NOTE — ED Triage Notes (Signed)
Pt c/o urinary freq, dysuria x 3-4 days-NAD-steady gait

## 2022-08-19 NOTE — Discharge Instructions (Signed)

## 2022-08-19 NOTE — ED Provider Notes (Signed)
[facility]  Note:  This document was prepared using Systems analyst and may include unintentional dictation errors.  MRN: [id number] DOB: 1945-11-07  Subjective:   Amanda Lambert is a 76 y.o. female presenting for 4 day history of acute onset urinary frequency, dysuria, urinary urgency.  Has a history of UTIs.  Last episode was in August. Denies fever, n/v, abdominal or pelvic pain.   No current facility-administered medications for this encounter.  Current Outpatient Medications:    aspirin EC 81 MG tablet, Take 1 tablet (81 mg total) by mouth daily., Disp: 90 tablet, Rfl: 3   Cyanocobalamin (VITAMIN B-12 PO), Take 1 tablet by mouth in the morning., Disp: , Rfl:    EPINEPHrine 0.3 mg/0.3 mL IJ SOAJ injection, Inject 0.3 mg into the muscle once as needed for anaphylaxis., Disp: , Rfl:    esomeprazole (NEXIUM) 20 MG capsule, Take 40 mg by mouth in the morning., Disp: , Rfl:    furosemide (LASIX) 20 MG tablet, TAKE 1 TABLET (20 MG TOTAL) BY MOUTH DAILY AS NEEDED FOR EDEMA., Disp: 90 tablet, Rfl: 3   gabapentin (NEURONTIN) 300 MG capsule, Take 300 mg by mouth at bedtime., Disp: , Rfl:    isosorbide dinitrate (ISORDIL) 20 MG tablet, TAKE 1 TABLET BY MOUTH THREE TIMES A DAY, Disp: 270 tablet, Rfl: 3   labetalol (NORMODYNE) 200 MG tablet, TAKE 1 TABLET BY MOUTH TWICE A DAY (Patient taking differently: Take 100 mg by mouth 2 (two) times daily.), Disp: 180 tablet, Rfl: 1   magnesium 30 MG tablet, Take 30 mg by mouth 2 (two) times daily., Disp: , Rfl:    nitroGLYCERIN (NITROSTAT) 0.4 MG SL tablet, Place 1 tablet (0.4 mg total) under the tongue every 5 (five) minutes as needed for up to 25 days for chest pain., Disp: 25 tablet, Rfl: 3   Probiotic Product (ALIGN PO), Take by mouth., Disp: , Rfl:    propranolol (INDERAL) 20 MG tablet, TAKE 1 TABLET BY MOUTH THREE TIMES A DAY, Disp: 270 tablet, Rfl: 0   rosuvastatin (CRESTOR) 10 MG tablet, TAKE 1 TABLET BY  MOUTH EVERY DAY, Disp: 90 tablet, Rfl: 3   sodium chloride (OCEAN) 0.65 % SOLN nasal spray, Place 1 spray into both nostrils as needed for congestion., Disp: , Rfl:    triamcinolone (KENALOG) 0.025 % cream, Apply 1 Application topically 2 (two) times daily as needed., Disp: 160 g, Rfl: 0   triamterene-hydrochlorothiazide (MAXZIDE-25) 37.5-25 MG tablet, Take 1 tablet by mouth in the morning., Disp: , Rfl:    VITAMIN D PO, Take 1 capsule by mouth every other day. In the morning, Disp: , Rfl:    Allergies  Allergen Reactions   Ace Inhibitors Anaphylaxis    Angioedema.   Bee Venom Anaphylaxis and Other (See Comments)   Thorazine [Chlorpromazine] Anaphylaxis   Wasp Venom Anaphylaxis   Levaquin [Levofloxacin In D5w] Nausea And Vomiting    Stated by patient she could not handle levaquin even with antiemetics    Shrimp [Shellfish Allergy] Rash and Hives    "splotches"    Amlodipine     severe leg edema   Atropine Sulfate Other (See Comments)   Elemental Sulfur Itching        Glycopyrrolate Other (See Comments)   Ramipril Other (See Comments)   Sulfa Antibiotics Other (See Comments)   Compazine [Prochlorperazine Edisylate] Anxiety   Latex Rash    Past Medical History:  Diagnosis Date   Arthritis  right elbow   Biliary dyskinesia    Constipation    GERD (gastroesophageal reflux disease)    Hepatitis B 1975   Hypertension    Nausea    PONV (postoperative nausea and vomiting)    slow to wake up   [facility] spotted fever 04/01/2012   adm. to hospital   Syncope 10/17/2019   Weakness      Past Surgical History:  Procedure Laterality Date   APPENDECTOMY  1963   CHOLECYSTECTOMY  04/26/2012   Procedure: LAPAROSCOPIC CHOLECYSTECTOMY WITH INTRAOPERATIVE CHOLANGIOGRAM;  Surgeon: Merrie Roof, MD;  Location: [facility];  Service: General;  Laterality: N/A;   CORONARY ANGIOGRAPHY N/A 07/19/2021   Procedure: CORONARY ANGIOGRAPHY (CATH LAB);  Surgeon: Adrian Prows, MD;  Location: [place] [facility];  Service: Cardiovascular;  Laterality: N/A;   CORONARY STENT INTERVENTION N/A 06/14/2021   Procedure: CORONARY STENT INTERVENTION;  Surgeon: Adrian Prows, MD;  Location: [place] [facility];  Service: Cardiovascular;  Laterality: N/A;   DILATION AND CURETTAGE OF UTERUS  1982   for miscarriage   DILATION AND CURETTAGE, DIAGNOSTIC / THERAPEUTIC  1972   INTRAVASCULAR IMAGING/OCT N/A 06/14/2021   Procedure: INTRAVASCULAR IMAGING/OCT;  Surgeon: Adrian Prows, MD;  Location: [place] [facility];  Service: Cardiovascular;  Laterality: N/A;   INTRAVASCULAR PRESSURE WIRE/FFR STUDY N/A 07/19/2021   Procedure: INTRAVASCULAR PRESSURE WIRE/FFR STUDY;  Surgeon: Adrian Prows, MD;  Location: [place] [facility];  Service: Cardiovascular;  Laterality: N/A;   LEFT HEART CATH AND CORONARY ANGIOGRAPHY N/A 11/04/2019   Procedure: LEFT HEART CATH AND CORONARY ANGIOGRAPHY;  Surgeon: Adrian Prows, MD;  Location: [place] [facility];  Service: Cardiovascular;  Laterality: N/A;   LEFT HEART CATH AND CORONARY ANGIOGRAPHY N/A 06/14/2021   Procedure: LEFT HEART CATH AND CORONARY ANGIOGRAPHY;  Surgeon: Adrian Prows, MD;  Location: [place] [facility];  Service: Cardiovascular;  Laterality: N/A;   TONSILLECTOMY  1964  - approximate   WRIST FRACTURE SURGERY  2008   right    Family History  Problem Relation Age of Onset   Cardiomyopathy Mother    Coronary artery disease Father    Hypertension Brother    Prostate cancer Brother     Social History   Tobacco Use   Smoking status: Never   Smokeless tobacco: Never  Vaping Use   Vaping Use: Never used  Substance Use Topics   Alcohol use: No   Drug use: No    ROS   Objective:   Vitals: BP (!) 148/83 (BP Location: Right Arm)   Pulse 65   Temp 98.1 F (36.7 C) (Oral)   Resp 20   SpO2 95%   Physical Exam Constitutional:      General: She is not in acute distress.    Appearance: Normal appearance. She is well-developed. She is not ill-appearing,  toxic-appearing or diaphoretic.  HENT:     Head: Normocephalic and atraumatic.     Nose: Nose normal.     Mouth/Throat:     Mouth: Mucous membranes are moist.     Pharynx: Oropharynx is clear.  Eyes:     General: No scleral icterus.       Right eye: No discharge.        Left eye: No discharge.     Extraocular Movements: Extraocular movements intact.     Conjunctiva/sclera: Conjunctivae normal.  Cardiovascular:     Rate and Rhythm: Normal rate.  Pulmonary:     Effort: Pulmonary effort is normal.  Abdominal:     General: Bowel sounds are normal. There is no distension.     Palpations: Abdomen is soft. There is no mass.     Tenderness: There is no abdominal tenderness. There is no right CVA tenderness, left CVA tenderness, guarding or rebound.  Skin:    General: Skin is warm and dry.  Neurological:     General: No focal deficit present.     Mental Status: She is alert and oriented to person, place, and time.  Psychiatric:        Mood and Affect: Mood normal.        Behavior: Behavior normal.        Thought Content: Thought content normal.        Judgment: Judgment normal.     Results for orders placed or performed during the hospital encounter of 08/19/22 (from the past 24 hour(s))  POCT urinalysis dipstick     Status: Abnormal   Collection Time: 08/19/22  1:05 PM  Result Value Ref Range   Color, UA yellow yellow   Clarity, UA cloudy (A) clear   Glucose, UA =100 (A) negative mg/dL   Bilirubin, UA negative negative   Ketones, POC UA negative negative mg/dL   Spec Grav, UA 1.015 1.010 - 1.025   Blood, UA trace-intact (A) negative   pH, UA 7.0 5.0 - 8.0   Protein Ur, POC =30 (A) negative mg/dL   Urobilinogen, UA 1.0 0.2 or 1.0 E.U./dL   Nitrite, UA Positive (A) Negative   Leukocytes, UA Large (3+) (A) Negative    Assessment and Plan :   PDMP not reviewed this encounter.  1. Acute cystitis with hematuria   2. Urinary frequency     Creatinine clearance calculated  at 61 mL/min. Start Keflex to cover for acute cystitis, urine culture pending.  Recommended aggressive hydration, limiting urinary irritants. Will have her use fluconazole for yeast infection from antibiotic use. Counseled patient on potential for adverse effects with medications prescribed/recommended today, ER and return-to-clinic precautions discussed, patient verbalized understanding.    Jaynee Eagles, [place] 08/19/22 1317

## 2022-08-21 LAB — URINE CULTURE: Culture: >=100000 — AB

## 2022-08-22 DIAGNOSIS — Z23 Encounter for immunization: Secondary | ICD-10-CM | POA: Diagnosis not present

## 2022-09-04 DIAGNOSIS — M7061 Trochanteric bursitis, right hip: Secondary | ICD-10-CM | POA: Diagnosis not present

## 2022-09-04 DIAGNOSIS — M545 Low back pain, unspecified: Secondary | ICD-10-CM | POA: Diagnosis not present

## 2022-09-16 DIAGNOSIS — R350 Frequency of micturition: Secondary | ICD-10-CM | POA: Diagnosis not present

## 2022-09-17 ENCOUNTER — Ambulatory Visit: Payer: Self-pay

## 2022-10-02 DIAGNOSIS — R399 Unspecified symptoms and signs involving the genitourinary system: Secondary | ICD-10-CM | POA: Diagnosis not present

## 2022-10-11 DIAGNOSIS — R634 Abnormal weight loss: Secondary | ICD-10-CM | POA: Diagnosis not present

## 2022-10-11 DIAGNOSIS — R197 Diarrhea, unspecified: Secondary | ICD-10-CM | POA: Diagnosis not present

## 2022-10-11 DIAGNOSIS — K5900 Constipation, unspecified: Secondary | ICD-10-CM | POA: Diagnosis not present

## 2022-10-11 DIAGNOSIS — D509 Iron deficiency anemia, unspecified: Secondary | ICD-10-CM | POA: Diagnosis not present

## 2022-10-11 DIAGNOSIS — K589 Irritable bowel syndrome without diarrhea: Secondary | ICD-10-CM | POA: Diagnosis not present

## 2022-10-11 DIAGNOSIS — R109 Unspecified abdominal pain: Secondary | ICD-10-CM | POA: Diagnosis not present

## 2022-10-17 DIAGNOSIS — R197 Diarrhea, unspecified: Secondary | ICD-10-CM | POA: Diagnosis not present

## 2022-10-24 DIAGNOSIS — N3021 Other chronic cystitis with hematuria: Secondary | ICD-10-CM | POA: Diagnosis not present

## 2022-10-24 DIAGNOSIS — N3020 Other chronic cystitis without hematuria: Secondary | ICD-10-CM | POA: Diagnosis not present

## 2022-10-24 DIAGNOSIS — R102 Pelvic and perineal pain: Secondary | ICD-10-CM | POA: Diagnosis not present

## 2022-11-07 DIAGNOSIS — D509 Iron deficiency anemia, unspecified: Secondary | ICD-10-CM | POA: Diagnosis not present

## 2022-12-01 DIAGNOSIS — H2513 Age-related nuclear cataract, bilateral: Secondary | ICD-10-CM | POA: Diagnosis not present

## 2022-12-06 ENCOUNTER — Other Ambulatory Visit: Payer: Self-pay | Admitting: Gastroenterology

## 2022-12-06 DIAGNOSIS — R1031 Right lower quadrant pain: Secondary | ICD-10-CM | POA: Diagnosis not present

## 2022-12-08 ENCOUNTER — Other Ambulatory Visit: Payer: Self-pay | Admitting: Cardiology

## 2022-12-08 DIAGNOSIS — N3021 Other chronic cystitis with hematuria: Secondary | ICD-10-CM | POA: Diagnosis not present

## 2022-12-08 DIAGNOSIS — G250 Essential tremor: Secondary | ICD-10-CM

## 2022-12-08 DIAGNOSIS — R102 Pelvic and perineal pain: Secondary | ICD-10-CM | POA: Diagnosis not present

## 2022-12-14 ENCOUNTER — Ambulatory Visit
Admission: RE | Admit: 2022-12-14 | Discharge: 2022-12-14 | Disposition: A | Discharge status: Home / Self Care | Payer: Medicare Other | Source: Ambulatory Visit | Attending: Gastroenterology | Admitting: Gastroenterology

## 2022-12-14 DIAGNOSIS — K5730 Diverticulosis of large intestine without perforation or abscess without bleeding: Secondary | ICD-10-CM | POA: Diagnosis not present

## 2022-12-14 DIAGNOSIS — Z9049 Acquired absence of other specified parts of digestive tract: Secondary | ICD-10-CM | POA: Diagnosis not present

## 2022-12-14 DIAGNOSIS — R1031 Right lower quadrant pain: Secondary | ICD-10-CM

## 2022-12-14 DIAGNOSIS — K589 Irritable bowel syndrome without diarrhea: Secondary | ICD-10-CM | POA: Diagnosis not present

## 2022-12-14 DIAGNOSIS — I700 Atherosclerosis of aorta: Secondary | ICD-10-CM | POA: Diagnosis not present

## 2022-12-14 MED ORDER — IOPAMIDOL (ISOVUE-300) INJECTION 61%
100.0000 mL | Freq: Once | INTRAVENOUS | Status: AC | PRN
  Administered 2022-12-14: 100 mL via INTRAVENOUS

## 2023-01-03 DIAGNOSIS — N3021 Other chronic cystitis with hematuria: Secondary | ICD-10-CM | POA: Diagnosis not present

## 2023-01-03 DIAGNOSIS — R102 Pelvic and perineal pain: Secondary | ICD-10-CM | POA: Diagnosis not present

## 2023-01-12 DIAGNOSIS — R1031 Right lower quadrant pain: Secondary | ICD-10-CM | POA: Diagnosis not present

## 2023-02-07 DIAGNOSIS — M7071 Other bursitis of hip, right hip: Secondary | ICD-10-CM | POA: Diagnosis not present

## 2023-02-07 DIAGNOSIS — M6281 Muscle weakness (generalized): Secondary | ICD-10-CM | POA: Diagnosis not present

## 2023-02-07 DIAGNOSIS — R102 Pelvic and perineal pain: Secondary | ICD-10-CM | POA: Diagnosis not present

## 2023-02-12 DIAGNOSIS — G500 Trigeminal neuralgia: Secondary | ICD-10-CM | POA: Diagnosis not present

## 2023-02-12 DIAGNOSIS — I25119 Atherosclerotic heart disease of native coronary artery with unspecified angina pectoris: Secondary | ICD-10-CM | POA: Diagnosis not present

## 2023-02-12 DIAGNOSIS — R635 Abnormal weight gain: Secondary | ICD-10-CM | POA: Diagnosis not present

## 2023-02-12 DIAGNOSIS — G250 Essential tremor: Secondary | ICD-10-CM | POA: Diagnosis not present

## 2023-02-12 DIAGNOSIS — Z1331 Encounter for screening for depression: Secondary | ICD-10-CM | POA: Diagnosis not present

## 2023-02-12 DIAGNOSIS — G894 Chronic pain syndrome: Secondary | ICD-10-CM | POA: Diagnosis not present

## 2023-02-12 DIAGNOSIS — J309 Allergic rhinitis, unspecified: Secondary | ICD-10-CM | POA: Diagnosis not present

## 2023-02-12 DIAGNOSIS — G2581 Restless legs syndrome: Secondary | ICD-10-CM | POA: Diagnosis not present

## 2023-02-12 DIAGNOSIS — Z0000 Encounter for general adult medical examination without abnormal findings: Secondary | ICD-10-CM | POA: Diagnosis not present

## 2023-02-12 DIAGNOSIS — K219 Gastro-esophageal reflux disease without esophagitis: Secondary | ICD-10-CM | POA: Diagnosis not present

## 2023-02-12 DIAGNOSIS — E559 Vitamin D deficiency, unspecified: Secondary | ICD-10-CM | POA: Diagnosis not present

## 2023-02-12 DIAGNOSIS — I779 Disorder of arteries and arterioles, unspecified: Secondary | ICD-10-CM | POA: Diagnosis not present

## 2023-02-12 DIAGNOSIS — I10 Essential (primary) hypertension: Secondary | ICD-10-CM | POA: Diagnosis not present

## 2023-02-15 ENCOUNTER — Ambulatory Visit: Payer: Medicare Other | Admitting: Cardiology

## 2023-02-20 ENCOUNTER — Ambulatory Visit: Payer: Medicare Other | Admitting: Cardiology

## 2023-02-20 ENCOUNTER — Encounter: Payer: Self-pay | Admitting: Cardiology

## 2023-02-20 VITALS — BP 121/71 | HR 73 | Resp 16 | Ht 67.0 in | Wt 194.8 lb

## 2023-02-20 DIAGNOSIS — I6523 Occlusion and stenosis of bilateral carotid arteries: Secondary | ICD-10-CM | POA: Diagnosis not present

## 2023-02-20 DIAGNOSIS — I83893 Varicose veins of bilateral lower extremities with other complications: Secondary | ICD-10-CM | POA: Diagnosis not present

## 2023-02-20 DIAGNOSIS — I25118 Atherosclerotic heart disease of native coronary artery with other forms of angina pectoris: Secondary | ICD-10-CM | POA: Diagnosis not present

## 2023-02-20 DIAGNOSIS — I10 Essential (primary) hypertension: Secondary | ICD-10-CM | POA: Diagnosis not present

## 2023-02-20 DIAGNOSIS — R102 Pelvic and perineal pain: Secondary | ICD-10-CM | POA: Diagnosis not present

## 2023-02-20 DIAGNOSIS — M6281 Muscle weakness (generalized): Secondary | ICD-10-CM | POA: Diagnosis not present

## 2023-02-20 DIAGNOSIS — E7800 Pure hypercholesterolemia, unspecified: Secondary | ICD-10-CM | POA: Diagnosis not present

## 2023-02-20 DIAGNOSIS — M7071 Other bursitis of hip, right hip: Secondary | ICD-10-CM | POA: Diagnosis not present

## 2023-02-20 MED ORDER — BUMETANIDE 1 MG PO TABS: 1.0000 mg | ORAL_TABLET | Freq: Every day | ORAL | 0 refills | Status: DC

## 2023-02-20 NOTE — Progress Notes (Signed)
Primary Physician/Referring:  Marden Noble, MD (Inactive)  Patient ID: Amanda Lambert, female    DOB: 06/10/1946, 77 y.o.   MRN: [id number]  Chief Complaint  Patient presents with   Coronary Artery Disease   Carotid stenosis   Hypertension   Follow-up    1 year    HPI:    Amanda Lambert  is a 77 y.o. 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  This is an annual visit.  Fortunately she has not had angina pectoris and has not used sublingual nitroglycerin in several months.  Her main complaint is worsening leg edema.  On further questioning, patient has relatively poor eating habits, eats out at least 3 times a week.  Past Medical History:  Diagnosis Date   Arthritis    right elbow   Biliary dyskinesia    Constipation    GERD (gastroesophageal reflux disease)    Hepatitis B 1975   Hypertension    Nausea    PONV (postoperative nausea and vomiting)    slow to wake up   [facility] spotted fever 04/01/2012   adm. to hospital   Syncope 10/17/2019   Weakness    Social History   Tobacco Use   Smoking status: Never   Smokeless tobacco: Never  Substance Use Topics   Alcohol use: No   Marital Status: Widowed ROS  Review of Systems  Cardiovascular:  Positive for leg swelling. Negative for chest pain and dyspnea on exertion.   Objective  Blood pressure 121/71, pulse 73, resp. rate 16, height 5\' 7"  (1.702 m), weight 194 lb 12.8 oz (88.4 kg), SpO2 94 %.     02/20/2023    2:52 PM 08/19/2022   12:53 PM 05/08/2022   12:04 PM  Vitals with BMI  Height 5\' 7"     Weight 194 lbs 13 oz    BMI 30.5    Systolic 121 148 629  Diastolic 71 83 61  Pulse 73 65 66     Physical Exam Vitals reviewed.  Constitutional:      Appearance: She is obese.  Neck:     Vascular: No carotid bruit or  JVD.  Cardiovascular:     Rate and Rhythm: Normal rate and regular rhythm.     Pulses: Normal pulses and intact distal pulses.     Heart sounds: Normal heart sounds, S1 normal and S2 normal. No murmur heard.    No gallop.  Pulmonary:     Effort: Pulmonary effort is normal.     Breath sounds: Normal breath sounds.  Abdominal:     General: Bowel sounds are normal.     Palpations: Abdomen is soft.  Musculoskeletal:     Right lower leg: Edema (2 + mostly non pitting) present.     Left lower leg: Edema (2 + mostly non pitting) present.    Laboratory examination:   External Labs:  Cholesterol, total 135.000 m 02/12/2023 HDL 65.000 mg 02/12/2023 LDL 43.000 mg 02/08/2022 Triglycerides 115.000 m 02/12/2023  Hemoglobin 12.500 g/d 10/11/2022  Creatinine, Serum 0.980 mg/ 08/10/2022 CrCl Est 48.23 08/10/2022 eGFR 60.000 calc 08/10/2022 Potassium 3.800 mg/ 08/10/2022  Labs 02/08/2022:  Hb 11.4/HCT 33.8, platelets 287, normal indicis.  BUN 15, creatinine 1.18, EGFR 48 mL (Stable), potassium 4.5, LFTs normal.  TSH 1.20.  Vitamin D 43.2.  Radiology:  No results found.  Cardiac Studies:   Echocardiogram 10/15/2019: Left ventricle cavity is normal in size. Moderate concentric hypertrophy of the left ventricle. Normal global wall motion. Normal LV systolic function with visual EF 50-55%. Doppler evidence of grade I (impaired) diastolic dysfunction, normal LAP. Left atrial cavity is mildly dilated. Moderate (Grade II) mitral regurgitation. Structurally normal tricuspid valve.  Mild to moderate tricuspid regurgitation. No evidence of pulmonary hypertension.  Lexiscan (Walking with mod Bruce)Tetrofosmin Stress Test  10/20/2019: Nondiagnostic ECG stress. Mild degree large extent fixed perfusion defect located in the mid anteroseptal wall, mid inferoseptal wall, basal anteroseptal wall and basal inferoseptal wall. This is consistent with LBBB, mild ischemia cannot be completely excluded.  All  segments of left ventricle demonstrated normal wall motion and thickening. Stress LV EF is normal 61%.  Low risk. No previous exam available for comparison.  Left Heart Catheterization 07/19/21: LM: Smooth and normal. CX: Smooth and normal. LAD: Large vessel, gives origin to large D1.  Just after the origin of large D1 and a large septal perforator, there is a stent that was previously placed 06/14/2021, 3.0 x 30 mm Onyx is widely patent.  Prior to the stent, the proximal segment at the bifurcation of D1 and large septal perforator, there is a 30" 40% stenosis. RFR was 0.92, FFR 0.89, CFR 4.8, IMR 18.  Findings do not suggest macrovascular or microvascular disease. RCA: Not studied, previously normal.    Carotid artery duplex 03/01/2022: Doppler velocity suggests stenosis in the right internal carotid artery (50-69%). Doppler velocity suggests stenosis in the left internal carotid artery (50-69%). There is moderate amount of homogenous plaque bilaterally. Antegrade right vertebral artery flow. Antegrade left vertebral artery flow. Compared to 04/29/2021, mild progression of the disease on the right from <50%. Follow up in six months is appropriate if clinically indicated. No significant change from 04/29/2021.   EKG   EKG 02/20/2023: Sinus rhythm with first-degree AV block at rate of 72 bpm, left atrial enlargement, left bundle branch block.  Single PVC.  Compared to 02/16/2022, first-degree AV block new.  Allergies & Medications   Allergies  Allergen Reactions   Ace Inhibitors Anaphylaxis    Angioedema.   Bee Venom Anaphylaxis and Other (See Comments)   Thorazine [Chlorpromazine] Anaphylaxis   Wasp Venom Anaphylaxis   Levaquin [Levofloxacin In D5w] Nausea And Vomiting    Stated by patient she could not handle levaquin even with antiemetics    Shrimp [Shellfish Allergy] Rash and Hives    "splotches"    Amlodipine     severe leg edema   Atropine Sulfate Other (See Comments)    Elemental Sulfur Itching        Glycopyrrolate Other (See Comments)   Ramipril Other (See Comments)   Sulfa Antibiotics Other (See Comments)   Compazine [Prochlorperazine Edisylate] Anxiety   Latex Rash    Current Outpatient Medications:    aspirin EC 81 MG tablet, Take 1 tablet (81 mg total) by mouth daily., Disp: 90 tablet, Rfl: 3   bumetanide (BUMEX) 1 MG tablet, Take 1 tablet (1 mg total) by mouth daily., Disp: 90 tablet, Rfl: 0   Cyanocobalamin (VITAMIN B-12 PO), Take 1 tablet by mouth in the morning., Disp: , Rfl:    EPINEPHrine 0.3 mg/0.3 mL IJ SOAJ injection, Inject 0.3 mg into the muscle once as needed for anaphylaxis., Disp: , Rfl:    esomeprazole (NEXIUM) 40 MG capsule, Take 40 mg by mouth in the morning., Disp: , Rfl:    gabapentin (NEURONTIN) 300 MG capsule,  Take 300 mg by mouth at bedtime., Disp: , Rfl:    isosorbide dinitrate (ISORDIL) 20 MG tablet, TAKE 1 TABLET BY MOUTH THREE TIMES A DAY, Disp: 270 tablet, Rfl: 3   labetalol (NORMODYNE) 200 MG tablet, TAKE 1 TABLET BY MOUTH TWICE A DAY (Patient taking differently: Take 100 mg by mouth 2 (two) times daily.), Disp: 180 tablet, Rfl: 1   magnesium 30 MG tablet, Take 30 mg by mouth 2 (two) times daily., Disp: , Rfl:    nitroGLYCERIN (NITROSTAT) 0.4 MG SL tablet, Place 1 tablet (0.4 mg total) under the tongue every 5 (five) minutes as needed for up to 25 days for chest pain., Disp: 25 tablet, Rfl: 3   Probiotic Product (ALIGN PO), Take by mouth., Disp: , Rfl:    propranolol (INDERAL) 20 MG tablet, TAKE 1 TABLET BY MOUTH THREE TIMES A DAY, Disp: 270 tablet, Rfl: 0   rosuvastatin (CRESTOR) 10 MG tablet, TAKE 1 TABLET BY MOUTH EVERY DAY, Disp: 90 tablet, Rfl: 3   sodium chloride (OCEAN) 0.65 % SOLN nasal spray, Place 1 spray into both nostrils as needed for congestion., Disp: , Rfl:    triamcinolone (KENALOG) 0.025 % cream, Apply 1 Application topically 2 (two) times daily as needed., Disp: 160 g, Rfl: 0    triamterene-hydrochlorothiazide (MAXZIDE-25) 37.5-25 MG tablet, Take 1 tablet by mouth in the morning., Disp: , Rfl:    VITAMIN D PO, Take 1 capsule by mouth every other day. In the morning, Disp: , Rfl:    Assessment     ICD-10-CM   1. Coronary artery disease of native artery of native heart with stable angina pectoris (HCC)  I25.118 EKG 12-Lead    2. Asymptomatic bilateral carotid artery stenosis  I65.23 PCV CAROTID DUPLEX (BILATERAL)    3. Primary hypertension  I10     4. Hypercholesteremia  E78.00     5. Varicose veins of leg with edema, bilateral  I83.893 Ambulatory referral to Interventional Radiology    bumetanide (BUMEX) 1 MG tablet      Meds ordered this encounter  Medications   bumetanide (BUMEX) 1 MG tablet    Sig: Take 1 tablet (1 mg total) by mouth daily.    Dispense:  90 tablet    Refill:  0    Discontinue furosemide    Medications Discontinued During This Encounter  Medication Reason   cephALEXin (KEFLEX) 500 MG capsule Completed Course   fluconazole (DIFLUCAN) 150 MG tablet Completed Course   furosemide (LASIX) 20 MG tablet Ineffective     Recommendations:   ANAILAH MOKRY  is a 77 y.o. 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  Presents for annual visit.  1. Coronary artery disease of native artery of native heart with stable angina pectoris (HCC) From coronary artery disease standpoint she has remained stable without recurrence of angina pectoris.  Left bundle branch block with first-degree AV block, first-degree block is new compared to previous.  However she remains asymptomatic without fatigue or dizziness or dyspnea.  - EKG 12-Lead  2. Asymptomatic bilateral carotid artery stenosis Patient needs carotid artery surveillance duplex.  She has moderate bilateral  disease. - PCV CAROTID DUPLEX (BILATERAL); Future  3. Primary hypertension Blood pressure is well-controlled.  4. Hypercholesteremia I reviewed her external labs, LDL is at goal at <70.  5. Varicose veins of leg with  edema, bilateral She complains of worsening leg edema, upon review, it appears that she has both pitting and nonpitting but mostly nonpitting edema.  I suspect a combination of excess salt intake and also dependent edema, would like to exclude varicose veins and venous insufficiency, will make a referral for interventional radiology to evaluate.  As she is not responding well to furosemide, will try Bumex.  She has been taking 40 mg of furosemide on a daily basis.  I have discussed with her regarding avoidance of taking diuretics to reduce the risk of advancing renal failure.  She does have stage IIIa chronic kidney disease.  - Ambulatory referral to Interventional Radiology - bumetanide (BUMEX) 1 MG tablet; Take 1 tablet (1 mg total) by mouth daily.  Dispense: 90 tablet; Refill: 0  I will see her back on an annual basis.  [facility] Outcomes trial: To study effect of Retatrutide (GLP-1 agonist) weekly subcu injection on MACE and declining renal function in patients with BMI >27 and ASCVD Eldor CKD, 3-year study.    Yates Decamp, PA-C 02/20/2023, 3:30 PM Office: [phone]

## 2023-03-14 ENCOUNTER — Ambulatory Visit: Payer: Medicare Other

## 2023-03-14 DIAGNOSIS — I6523 Occlusion and stenosis of bilateral carotid arteries: Secondary | ICD-10-CM | POA: Diagnosis not present

## 2023-03-15 ENCOUNTER — Other Ambulatory Visit: Payer: Self-pay | Admitting: Cardiology

## 2023-03-15 DIAGNOSIS — G250 Essential tremor: Secondary | ICD-10-CM

## 2023-03-18 NOTE — Progress Notes (Addendum)
Carotid artery duplex 03/14/2023: Duplex suggests stenosis in the right internal carotid artery (50-69%). Duplex suggests stenosis in the left internal carotid artery (16-49%). Antegrade right vertebral artery flow. Antegrade left vertebral artery flow. Compared to the study done on 03/01/2022, no significant change. Follow up in six months is appropriate if clinically indicated.    ICD-10-CM   1. Asymptomatic bilateral carotid artery stenosis  I65.23 PCV CAROTID DUPLEX (BILATERAL)    PCV CAROTID DUPLEX (BILATERAL)

## 2023-03-26 ENCOUNTER — Other Ambulatory Visit: Payer: Self-pay

## 2023-03-26 DIAGNOSIS — I209 Angina pectoris, unspecified: Secondary | ICD-10-CM

## 2023-03-26 MED ORDER — NITROGLYCERIN 0.4 MG SL SUBL: 0.4000 mg | SUBLINGUAL_TABLET | SUBLINGUAL | 3 refills | Status: DC | PRN

## 2023-03-26 NOTE — Telephone Encounter (Signed)
Pt calling and wants to know ,Should she be Remove from study so she can get meds for  diabetes and heart disease?

## 2023-03-28 ENCOUNTER — Other Ambulatory Visit: Payer: Self-pay | Admitting: Cardiology

## 2023-03-28 DIAGNOSIS — R600 Localized edema: Secondary | ICD-10-CM

## 2023-03-30 ENCOUNTER — Other Ambulatory Visit: Payer: Self-pay | Admitting: Cardiology

## 2023-03-30 DIAGNOSIS — I209 Angina pectoris, unspecified: Secondary | ICD-10-CM

## 2023-04-02 DIAGNOSIS — M7061 Trochanteric bursitis, right hip: Secondary | ICD-10-CM | POA: Diagnosis not present

## 2023-04-02 DIAGNOSIS — M545 Low back pain, unspecified: Secondary | ICD-10-CM | POA: Diagnosis not present

## 2023-04-03 ENCOUNTER — Emergency Department: Payer: Medicare Other

## 2023-04-03 ENCOUNTER — Emergency Department
Admission: EM | Admit: 2023-04-03 | Discharge: 2023-04-03 | Disposition: A | Discharge status: Home / Self Care | Payer: Medicare Other | Attending: Emergency Medicine | Admitting: Emergency Medicine

## 2023-04-03 DIAGNOSIS — R1084 Generalized abdominal pain: Secondary | ICD-10-CM

## 2023-04-03 DIAGNOSIS — K529 Noninfective gastroenteritis and colitis, unspecified: Secondary | ICD-10-CM | POA: Insufficient documentation

## 2023-04-03 DIAGNOSIS — R109 Unspecified abdominal pain: Secondary | ICD-10-CM | POA: Diagnosis not present

## 2023-04-03 DIAGNOSIS — I10 Essential (primary) hypertension: Secondary | ICD-10-CM | POA: Diagnosis not present

## 2023-04-03 DIAGNOSIS — N281 Cyst of kidney, acquired: Secondary | ICD-10-CM | POA: Diagnosis not present

## 2023-04-03 DIAGNOSIS — K439 Ventral hernia without obstruction or gangrene: Secondary | ICD-10-CM | POA: Insufficient documentation

## 2023-04-03 LAB — COMPREHENSIVE METABOLIC PANEL
ALT: 10 U/L (ref 0–44)
AST: 19 U/L (ref 15–41)
Albumin: 4.5 g/dL (ref 3.5–5.0)
Alkaline Phosphatase: 74 U/L (ref 38–126)
Anion gap: 12 (ref 5–15)
BUN: 18 mg/dL (ref 8–23)
CO2: 30 mmol/L (ref 22–32)
Calcium: 9.9 mg/dL (ref 8.9–10.3)
Chloride: 93 mmol/L — ABNORMAL LOW (ref 98–111)
Creatinine, Ser: 1.08 mg/dL — ABNORMAL HIGH (ref 0.44–1.00)
GFR, Estimated: 53 mL/min — ABNORMAL LOW (ref >60)
Glucose, Bld: 111 mg/dL — ABNORMAL HIGH (ref 70–99)
Potassium: 2.8 mmol/L — ABNORMAL LOW (ref 3.5–5.1)
Sodium: 135 mmol/L (ref 135–145)
Total Bilirubin: 0.9 mg/dL (ref 0.3–1.2)
Total Protein: 7.4 g/dL (ref 6.5–8.1)

## 2023-04-03 LAB — CBC
HCT: 39.8 % (ref 36.0–46.0)
Hemoglobin: 13.1 g/dL (ref 12.0–15.0)
MCH: 28.2 pg (ref 26.0–34.0)
MCHC: 32.9 g/dL (ref 30.0–36.0)
MCV: 85.6 fL (ref 80.0–100.0)
Platelets: 285 10*3/uL (ref 150–400)
RBC: 4.65 MIL/uL (ref 3.87–5.11)
RDW: 12.3 % (ref 11.5–15.5)
WBC: 7.3 10*3/uL (ref 4.0–10.5)
nRBC: 0 % (ref 0.0–0.2)

## 2023-04-03 LAB — URINALYSIS, ROUTINE W REFLEX MICROSCOPIC
Bilirubin Urine: NEGATIVE
Glucose, UA: NEGATIVE mg/dL
Hgb urine dipstick: NEGATIVE
Ketones, ur: NEGATIVE mg/dL
Nitrite: NEGATIVE
Protein, ur: NEGATIVE mg/dL
Specific Gravity, Urine: 1.008 (ref 1.005–1.030)
pH: 8 (ref 5.0–8.0)

## 2023-04-03 LAB — LIPASE, BLOOD: Lipase: 26 U/L (ref 11–51)

## 2023-04-03 MED ORDER — SODIUM CHLORIDE 0.9 % IV BOLUS
1000.0000 mL | Freq: Once | INTRAVENOUS | Status: AC
Start: 2023-04-03 — End: 2023-04-03
  Administered 2023-04-03: 1000 mL via INTRAVENOUS

## 2023-04-03 MED ORDER — MORPHINE SULFATE (PF) 2 MG/ML IV SOLN
2.0000 mg | Freq: Once | INTRAVENOUS | Status: AC
  Administered 2023-04-03: 2 mg via INTRAVENOUS
  Filled 2023-04-03: qty 1

## 2023-04-03 MED ORDER — ONDANSETRON HCL 4 MG/2ML IJ SOLN
4.0000 mg | Freq: Once | INTRAMUSCULAR | Status: AC
  Administered 2023-04-03: 4 mg via INTRAVENOUS
  Filled 2023-04-03: qty 2

## 2023-04-03 MED ORDER — POTASSIUM CHLORIDE CRYS ER 20 MEQ PO TBCR: 20.0000 meq | EXTENDED_RELEASE_TABLET | Freq: Two times a day (BID) | ORAL | 0 refills | Status: DC

## 2023-04-03 MED ORDER — POTASSIUM CHLORIDE CRYS ER 20 MEQ PO TBCR
40.0000 meq | EXTENDED_RELEASE_TABLET | Freq: Once | ORAL | Status: AC
  Administered 2023-04-03: 40 meq via ORAL
  Filled 2023-04-03: qty 2

## 2023-04-03 MED ORDER — POTASSIUM CHLORIDE 10 MEQ/100ML IV SOLN
10.0000 meq | INTRAVENOUS | Status: AC
  Administered 2023-04-03 (×2): 10 meq via INTRAVENOUS
  Filled 2023-04-03 (×2): qty 100

## 2023-04-03 MED ORDER — IOHEXOL 300 MG/ML  SOLN
100.0000 mL | Freq: Once | INTRAMUSCULAR | Status: AC | PRN
Start: 2023-04-03 — End: 2023-04-03
  Administered 2023-04-03: 100 mL via INTRAVENOUS

## 2023-04-03 MED ORDER — HYDROCODONE-ACETAMINOPHEN 5-325 MG PO TABS: 0.5000 | ORAL_TABLET | Freq: Four times a day (QID) | ORAL | 0 refills | Status: AC | PRN

## 2023-04-03 MED ORDER — ONDANSETRON HCL 4 MG PO TABS: 4.0000 mg | ORAL_TABLET | Freq: Four times a day (QID) | ORAL | 0 refills | Status: AC | PRN

## 2023-04-03 NOTE — ED Provider Notes (Signed)
[facility] Provider Note    Event Date/Time   First MD Initiated Contact with Patient 04/03/23 1749     (approximate)   History   Abdominal Pain   HPI  Amanda Lambert is a 77 y.o. female with history of hypertension, GERD, UTI, IBS presenting to the emergency department for evaluation of abdominal pain.  Patient states that for several months she has been having ongoing issues with abdominal pain and has seen multiple specialist including GI, OB/GYN, Ortho.  However, today she had onset of pain and tried taking her normal medication and it was persistent which is new for her.  She reports nausea without vomiting.  Last bowel movement was yesterday, no blood, normal for her.  No fevers.     Physical Exam   Triage Vital Signs: ED Triage Vitals [04/03/23 1616]  Enc Vitals Group     BP (!) 151/128     Pulse Rate 66     Resp 18     Temp 98 F (36.7 C)     Temp Source Oral     SpO2 100 %     Weight 194 lb 14.2 oz (88.4 kg)     Height 5\' 7"  (1.702 m)     Head Circumference      Peak Flow      Pain Score 7     Pain Loc      Pain Edu?      Excl. in GC?     Most recent vital signs: Vitals:   04/03/23 1930 04/03/23 2030  BP: (!) 124/59 139/72  Pulse: 72 71  Resp:    Temp:    SpO2: (!) 89% 100%     General: Awake, interactive, appears somewhat uncomfortable CV:  Regular rate, good peripheral perfusion.  Resp:  Lungs clear, unlabored respirations.  Abd:  Soft, nondistended, mild generalized tenderness without rebound or guarding Neuro:  Symmetric facial movement, fluid speech   ED Results / Procedures / Treatments   Labs (all labs ordered are listed, but only abnormal results are displayed) Labs Reviewed  COMPREHENSIVE METABOLIC PANEL - Abnormal; Notable for the following components:      Result Value   Potassium 2.8 (*)    Chloride 93 (*)    Glucose, Bld 111 (*)    Creatinine, Ser 1.08 (*)    GFR, Estimated 53 (*)    All other  components within normal limits  URINALYSIS, ROUTINE W REFLEX MICROSCOPIC - Abnormal; Notable for the following components:   Color, Urine YELLOW (*)    APPearance CLOUDY (*)    Leukocytes,Ua TRACE (*)    Bacteria, UA RARE (*)    All other components within normal limits  URINE CULTURE  LIPASE, BLOOD  CBC     EKG EKG independently reviewed interpreted by myself (ER attending) demonstrates:    RADIOLOGY Imaging independently reviewed and interpreted by myself demonstrates:  CT A/P without evidence of SBO  PROCEDURES:  Critical Care performed: No  Procedures   MEDICATIONS ORDERED IN ED: Medications  potassium chloride SA (KLOR-CON M) CR tablet 40 mEq (has no administration in time range)  sodium chloride 0.9 % bolus 1,000 mL (0 mLs Intravenous Stopped 04/03/23 2051)  ondansetron (ZOFRAN) injection 4 mg (4 mg Intravenous Given 04/03/23 1821)  morphine (PF) 2 MG/ML injection 2 mg (2 mg Intravenous Given 04/03/23 1821)  potassium chloride 10 mEq in 100 mL IVPB (0 mEq Intravenous Stopped 04/03/23 2050)  iohexol (OMNIPAQUE) 300 MG/ML  solution 100 mL (100 mLs Intravenous Contrast Given 04/03/23 1848)     IMPRESSION / MDM / ASSESSMENT AND PLAN / ED COURSE  I reviewed the triage vital signs and the nursing notes.  Differential diagnosis includes, but is not limited to, bowel obstruction, appendicitis, colitis, diverticulitis, viral GI infection  Patient's presentation is most consistent with acute presentation with potential threat to life or bodily function.  Lab work overall reassuring.  Urinalysis somewhat concerning for infection, but patient denying urinary symptoms and dirty sample with 21-50 squamous epithelial cells.  Will hold off on empiric treatment.  CT abdomen pelvis demonstrates focal area of bowel wall thickening around the umbilicus concerning for focal enteritis without bowel obstruction.  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  I was able to identify this  on exam and it was reducible at bedside.  No overlying erythema, warmth.  Patient updated on the results of her workup.  She does report feeling significantly improved here.  She tolerated a p.o. trial.  She is comfortable with discharge home with strict return precautions.  Will DC with short course of pain medicine, potassium supplementation for a few days, and nausea medicine as needed.       FINAL CLINICAL IMPRESSION(S) / ED DIAGNOSES   Final diagnoses:  Enteritis  Spigelian hernia  Generalized abdominal pain     Rx / DC Orders   ED Discharge Orders          Ordered    HYDROcodone-acetaminophen (NORCO) 5-325 MG tablet  Every 6 hours PRN        04/03/23 2108    ondansetron (ZOFRAN) 4 MG tablet  Every 6 hours PRN        04/03/23 2108    potassium chloride SA (KLOR-CON M) 20 MEQ tablet  2 times daily        04/03/23 2108             Note:  This document was prepared using Dragon voice recognition software and may include unintentional dictation errors.   Trinna Post, MD 04/03/23 [phone]

## 2023-04-03 NOTE — ED Triage Notes (Signed)
Pt here with lower abd pain that started this morning. Pt states she has been taking medications all day to try to relieve the pain, which has not helped. Pt states the pain is constant but gets worse at times. Pt states when the pain is bad she gets nauseous but has not thrown up yet. Pt states hx of IBS but has not had a bowel movement today.

## 2023-04-03 NOTE — Discharge Instructions (Addendum)
You were seen in the emergency department today for evaluation of your abdominal pain.  Your CT scan showed some inflammation of your bowel known as enteritis.  Your potassium level was low.  Your CT scan also showed a hernia.  You can follow-up with surgery as needed for this.  I sent a prescription for pain medicine, nausea medicine, and supplemental potassium to your pharmacy.  Please arrange follow-up with her primary care doctor within a few days.  Return to the ER for any new or worsening symptoms.

## 2023-04-05 LAB — URINE CULTURE: Culture: 60000 — AB

## 2023-04-09 DIAGNOSIS — E876 Hypokalemia: Secondary | ICD-10-CM | POA: Diagnosis not present

## 2023-04-09 DIAGNOSIS — R634 Abnormal weight loss: Secondary | ICD-10-CM | POA: Diagnosis not present

## 2023-04-09 DIAGNOSIS — K439 Ventral hernia without obstruction or gangrene: Secondary | ICD-10-CM | POA: Diagnosis not present

## 2023-04-09 DIAGNOSIS — I9589 Other hypotension: Secondary | ICD-10-CM | POA: Diagnosis not present

## 2023-04-09 DIAGNOSIS — R1030 Lower abdominal pain, unspecified: Secondary | ICD-10-CM | POA: Diagnosis not present

## 2023-04-12 DIAGNOSIS — N289 Disorder of kidney and ureter, unspecified: Secondary | ICD-10-CM | POA: Diagnosis not present

## 2023-04-17 ENCOUNTER — Ambulatory Visit: Payer: Self-pay | Admitting: Surgery

## 2023-04-17 DIAGNOSIS — I509 Heart failure, unspecified: Secondary | ICD-10-CM | POA: Insufficient documentation

## 2023-04-17 DIAGNOSIS — K4090 Unilateral inguinal hernia, without obstruction or gangrene, not specified as recurrent: Secondary | ICD-10-CM | POA: Diagnosis not present

## 2023-04-17 NOTE — H&P (View-Only) (Signed)
Subjective:   CC: Non-recurrent unilateral inguinal hernia without obstruction or gangrene [K40.90]  HPI:  Amanda Lambert is a 77 y.o. female who was referred by Self for evaluation of above. Symptoms were first noted 4 years ago. Unknown etiology until recently when noted to have right "spigelian" hernia.  Associated with nausea, exacerbated by nothing.  Lump is not palpable.    Past Medical History:  has a past medical history of CHF (congestive heart failure) (CMS/HHS-HCC), GERD (gastroesophageal reflux disease), and Hypertension.  Past Surgical History:  Past Surgical History:  Procedure Laterality Date   CHOLECYSTECTOMY OPEN  2017   APPENDECTOMY     77 years old    Family History: family history includes Heart disease in her father and mother; High blood pressure (Hypertension) in her brother, father, and mother; Stroke in her brother.  Social History:  reports that she has never smoked. She has never been exposed to tobacco smoke. She has never used smokeless tobacco. She reports that she does not drink alcohol and does not use drugs.  Current Medications: has a current medication list which includes the following prescription(s): bumetanide, hydrocodone-acetaminophen, isosorbide dinitrate, labetalol, ondansetron, propranolol, triamterene-hydrochlorothiazide, cholecalciferol (vit d3)(bulk), cyanocobalamin(vit b-12)(bulk), epinephrine, esomeprazole, gabapentin, magnesium oxide, and rosuvastatin.  Allergies:  Allergies as of 04/17/2023 - never reviewed  Allergen Reaction Noted   Ace inhibitors Anaphylaxis 04/17/2023   Venom-honey bee Anaphylaxis and Other (See Comments) 01/28/2022   Wasp venom Anaphylaxis 10/17/2019   Shellfish containing products Hives and Rash 04/04/2014   Sulfa (sulfonamide antibiotics) Other (See Comments) 01/28/2022   Latex Rash 04/17/2023    ROS:  A 15 point review of systems was performed and pertinent positives and negatives noted in HPI   Objective:      BP (!) 144/80   Pulse 90   Ht 167.6 cm (5\' 6" )   Wt 81.7 kg (180 lb 3.2 oz)   BMI 29.09 kg/m   Constitutional :  Alert, cooperative, no distress  Lymphatics/Throat:  Supple, no lymphadenopathy  Respiratory:  clear to auscultation bilaterally  Cardiovascular:  regular rate and rhythm  Gastrointestinal: soft, non-tender; bowel sounds normal; no masses,  no organomegaly. inguinal hernia noted.  small  Musculoskeletal: Steady gait and movement  Skin: Cool and moist  Psychiatric: Normal affect, non-agitated, not confused       LABS:  N/a   RADS: CLINICAL DATA:  Acute abdominal pain   EXAM: CT ABDOMEN AND PELVIS WITH CONTRAST   TECHNIQUE: Multidetector CT imaging of the abdomen and pelvis was performed using the standard protocol following bolus administration of intravenous contrast.   RADIATION DOSE REDUCTION: This exam was performed according to the departmental dose-optimization program which includes automated exposure control, adjustment of the mA and/or kV according to patient size and/or use of iterative reconstruction technique.   CONTRAST:  OMNIPAQUE IOHEXOL 300 MG/ML  SOLN   COMPARISON:  CT abdomen and pelvis 12/14/2022   FINDINGS: Lower chest: No acute abnormality.   Hepatobiliary: No focal liver abnormality is seen. Status post cholecystectomy. No biliary dilatation.   Pancreas: Unremarkable. No pancreatic ductal dilatation or surrounding inflammatory changes.   Spleen: Normal in size without focal abnormality.   Adrenals/Urinary Tract: There is some cortical scarring in the left kidney. There is a rounded hypodensity in the inferior pole the left kidney which is favored as a cyst, but too small to characterize. There is no hydronephrosis or perinephric fat stranding. The adrenal glands and bladder are within normal limits.   Stomach/Bowel:  There is a focal area of small-bowel wall thickening with surrounding inflammation involving the mid  small bowel near the level of the umbilicus image 2/42 there is no pneumatosis or free air. There is no bowel obstruction. The stomach and colon appear within normal limits. The appendix appears within normal limits.   Vascular/Lymphatic: Aortic atherosclerosis. No enlarged abdominal or pelvic lymph nodes.   Reproductive: Uterus and bilateral adnexa are unremarkable.   Other: In the anterior right lower quadrant abdominal wall there is a spigelian hernia. This contains a small amount of fat stranding and fluid, new from prior.   Musculoskeletal: No acute or significant osseous findings.   IMPRESSION: 1. Focal area of small bowel wall thickening with surrounding inflammation involving the mid small bowel near the level of the umbilicus. Findings are concerning for focal enteritis. No bowel obstruction. 2. Right lower quadrant Spigelian hernia containing a small amount of fat stranding and fluid, new from prior. 3. Left renal cortical scarring. 4. Left Bosniak II renal cyst, too small to characterize. No follow-up imaging is recommended. JACR 2018 Feb; 264-273, Management of the Incidental Renal Mass on CT, [employer] 2021; 814-848, Bosniak Classification of Cystic Renal Masses, Version 2019.   Aortic Atherosclerosis (ICD10-I70.0).     Electronically Signed   By: Darliss Cheney M.D.   On: 04/03/2023 19:19 Assessment:       Non-recurrent unilateral inguinal hernia without obstruction or gangrene [K40.90], RIGHT CT notes spegilian hernia but location seems more caudad based on review of actual images.  Surgical management similar regardless of actual location, so recommended proceeding with repair.  Plan:     1. Non-recurrent unilateral inguinal hernia without obstruction or gangrene [K40.90]   Discussed the risk of surgery including recurrence, which can be up to 50% in the case of incisional or complex hernias, possible use of prosthetic materials (mesh) and the  increased risk of mesh infxn if used, bleeding, chronic pain, post-op infxn, post-op SBO or ileus, and possible re-operation to address said risks. The risks of general anesthetic, if used, includes MI, CVA, sudden death or even reaction to anesthetic medications also discussed. Alternatives include continued observation.  Benefits include possible symptom relief, prevention of incarceration, strangulation, enlargement in size over time, and the risk of emergency surgery in the face of strangulation.   Typical post-op recovery time of 3-5 days with 2 weeks of activity restrictions were also discussed.  ED return precautions given for sudden increase in pain, size of hernia with accompanying fever, nausea, and/or vomiting.  The patient verbalized understanding and all questions were answered to the patient's satisfaction.   2. Patient has elected to proceed with surgical treatment. Procedure will be scheduled. right, robotic assisted laparoscopic  Hold aspirin 5 days prior if okay with cardiology.  History of stent placement couple years ago.  labs/images/medications/previous chart entries reviewed personally and relevant changes/updates noted above.

## 2023-04-17 NOTE — H&P (Addendum)
Subjective:   CC: Non-recurrent unilateral inguinal hernia without obstruction or gangrene [K40.90]  HPI:  Amanda Lambert is a 77 y.o. female who was referred by Self for evaluation of above. Symptoms were first noted 4 years ago. Unknown etiology until recently when noted to have right "spigelian" hernia.  Associated with nausea, exacerbated by nothing.  Lump is not palpable.    Past Medical History:  has a past medical history of CHF (congestive heart failure) (CMS/HHS-HCC), GERD (gastroesophageal reflux disease), and Hypertension.  Past Surgical History:  Past Surgical History:  Procedure Laterality Date   CHOLECYSTECTOMY OPEN  2017   APPENDECTOMY     77 years old    Family History: family history includes Heart disease in her father and mother; High blood pressure (Hypertension) in her brother, father, and mother; Stroke in her brother.  Social History:  reports that she has never smoked. She has never been exposed to tobacco smoke. She has never used smokeless tobacco. She reports that she does not drink alcohol and does not use drugs.  Current Medications: has a current medication list which includes the following prescription(s): bumetanide, hydrocodone-acetaminophen, isosorbide dinitrate, labetalol, ondansetron, propranolol, triamterene-hydrochlorothiazide, cholecalciferol (vit d3)(bulk), cyanocobalamin(vit b-12)(bulk), epinephrine, esomeprazole, gabapentin, magnesium oxide, and rosuvastatin.  Allergies:  Allergies as of 04/17/2023 - never reviewed  Allergen Reaction Noted   Ace inhibitors Anaphylaxis 04/17/2023   Venom-honey bee Anaphylaxis and Other (See Comments) 01/28/2022   Wasp venom Anaphylaxis 10/17/2019   Shellfish containing products Hives and Rash 04/04/2014   Sulfa (sulfonamide antibiotics) Other (See Comments) 01/28/2022   Latex Rash 04/17/2023    ROS:  A 15 point review of systems was performed and pertinent positives and negatives noted in HPI   Objective:      BP (!) 144/80   Pulse 90   Ht 167.6 cm (5\' 6" )   Wt 81.7 kg (180 lb 3.2 oz)   BMI 29.09 kg/m   Constitutional :  Alert, cooperative, no distress  Lymphatics/Throat:  Supple, no lymphadenopathy  Respiratory:  clear to auscultation bilaterally  Cardiovascular:  regular rate and rhythm  Gastrointestinal: soft, non-tender; bowel sounds normal; no masses,  no organomegaly. inguinal hernia noted.  small  Musculoskeletal: Steady gait and movement  Skin: Cool and moist  Psychiatric: Normal affect, non-agitated, not confused       LABS:  N/a   RADS: CLINICAL DATA:  Acute abdominal pain   EXAM: CT ABDOMEN AND PELVIS WITH CONTRAST   TECHNIQUE: Multidetector CT imaging of the abdomen and pelvis was performed using the standard protocol following bolus administration of intravenous contrast.   RADIATION DOSE REDUCTION: This exam was performed according to the departmental dose-optimization program which includes automated exposure control, adjustment of the mA and/or kV according to patient size and/or use of iterative reconstruction technique.   CONTRAST:  OMNIPAQUE IOHEXOL 300 MG/ML  SOLN   COMPARISON:  CT abdomen and pelvis 12/14/2022   FINDINGS: Lower chest: No acute abnormality.   Hepatobiliary: No focal liver abnormality is seen. Status post cholecystectomy. No biliary dilatation.   Pancreas: Unremarkable. No pancreatic ductal dilatation or surrounding inflammatory changes.   Spleen: Normal in size without focal abnormality.   Adrenals/Urinary Tract: There is some cortical scarring in the left kidney. There is a rounded hypodensity in the inferior pole the left kidney which is favored as a cyst, but too small to characterize. There is no hydronephrosis or perinephric fat stranding. The adrenal glands and bladder are within normal limits.   Stomach/Bowel:  There is a focal area of small-bowel wall thickening with surrounding inflammation involving the mid  small bowel near the level of the umbilicus image 2/42 there is no pneumatosis or free air. There is no bowel obstruction. The stomach and colon appear within normal limits. The appendix appears within normal limits.   Vascular/Lymphatic: Aortic atherosclerosis. No enlarged abdominal or pelvic lymph nodes.   Reproductive: Uterus and bilateral adnexa are unremarkable.   Other: In the anterior right lower quadrant abdominal wall there is a spigelian hernia. This contains a small amount of fat stranding and fluid, new from prior.   Musculoskeletal: No acute or significant osseous findings.   IMPRESSION: 1. Focal area of small bowel wall thickening with surrounding inflammation involving the mid small bowel near the level of the umbilicus. Findings are concerning for focal enteritis. No bowel obstruction. 2. Right lower quadrant Spigelian hernia containing a small amount of fat stranding and fluid, new from prior. 3. Left renal cortical scarring. 4. Left Bosniak II renal cyst, too small to characterize. No follow-up imaging is recommended. JACR 2018 Feb; 264-273, Management of the Incidental Renal Mass on CT, [employer] 2021; 814-848, Bosniak Classification of Cystic Renal Masses, Version 2019.   Aortic Atherosclerosis (ICD10-I70.0).     Electronically Signed   By: Darliss Cheney M.D.   On: 04/03/2023 19:19 Assessment:       Non-recurrent unilateral inguinal hernia without obstruction or gangrene [K40.90], RIGHT CT notes spegilian hernia but location seems more caudad based on review of actual images.  Surgical management similar regardless of actual location, so recommended proceeding with repair.  Plan:     1. Non-recurrent unilateral inguinal hernia without obstruction or gangrene [K40.90]   Discussed the risk of surgery including recurrence, which can be up to 50% in the case of incisional or complex hernias, possible use of prosthetic materials (mesh) and the  increased risk of mesh infxn if used, bleeding, chronic pain, post-op infxn, post-op SBO or ileus, and possible re-operation to address said risks. The risks of general anesthetic, if used, includes MI, CVA, sudden death or even reaction to anesthetic medications also discussed. Alternatives include continued observation.  Benefits include possible symptom relief, prevention of incarceration, strangulation, enlargement in size over time, and the risk of emergency surgery in the face of strangulation.   Typical post-op recovery time of 3-5 days with 2 weeks of activity restrictions were also discussed.  ED return precautions given for sudden increase in pain, size of hernia with accompanying fever, nausea, and/or vomiting.  The patient verbalized understanding and all questions were answered to the patient's satisfaction.   2. Patient has elected to proceed with surgical treatment. Procedure will be scheduled. right, robotic assisted laparoscopic  Hold aspirin 5 days prior if okay with cardiology.  History of stent placement couple years ago.  labs/images/medications/previous chart entries reviewed personally and relevant changes/updates noted above.

## 2023-04-18 ENCOUNTER — Encounter: Payer: Self-pay | Admitting: Cardiology

## 2023-04-20 ENCOUNTER — Ambulatory Visit
Admission: RE | Admit: 2023-04-20 | Discharge: 2023-04-20 | Disposition: A | Discharge status: Home / Self Care | Payer: Medicare Other | Source: Ambulatory Visit | Attending: Internal Medicine | Admitting: Internal Medicine

## 2023-04-20 VITALS — BP 126/63 | HR 70 | Temp 97.6°F | Resp 16

## 2023-04-20 DIAGNOSIS — R300 Dysuria: Secondary | ICD-10-CM

## 2023-04-20 DIAGNOSIS — N3001 Acute cystitis with hematuria: Secondary | ICD-10-CM | POA: Diagnosis not present

## 2023-04-20 DIAGNOSIS — R350 Frequency of micturition: Secondary | ICD-10-CM | POA: Diagnosis not present

## 2023-04-20 LAB — POCT URINALYSIS DIP (MANUAL ENTRY)
Bilirubin, UA: NEGATIVE
Glucose, UA: NEGATIVE mg/dL
Ketones, POC UA: NEGATIVE mg/dL
Nitrite, UA: NEGATIVE
Protein Ur, POC: NEGATIVE mg/dL
Spec Grav, UA: 1.015 (ref 1.010–1.025)
Urobilinogen, UA: 0.2 E.U./dL
pH, UA: 6.5 (ref 5.0–8.0)

## 2023-04-20 MED ORDER — CEPHALEXIN 500 MG PO CAPS: 500.0000 mg | ORAL_CAPSULE | Freq: Two times a day (BID) | ORAL | 0 refills | Status: DC

## 2023-04-20 NOTE — ED Provider Notes (Signed)
[facility]    CSN: [id number] Arrival date & time: 04/20/23  1411      History   Chief Complaint Chief Complaint  Patient presents with   Urinary Frequency    Suspected UTI with existing history of same. - Entered by patient    HPI Amanda Lambert is a 77 y.o. female.   Patient presents with dysuria and urinary frequency that started this morning.  Reports history of frequent UTIs.  She is followed by urology.  Reports that she was starting on estrogen vaginal cream with improvement.  Has been multiple months since she has had a UTI.  Denies hematuria, abdominal pain, back pain, fever.   Urinary Frequency    Past Medical History:  Diagnosis Date   Arthritis    right elbow   Biliary dyskinesia    Constipation    GERD (gastroesophageal reflux disease)    Hepatitis B 1975   Hypertension    Nausea    PONV (postoperative nausea and vomiting)    slow to wake up   [facility] spotted fever 04/01/2012   adm. to hospital   Syncope 10/17/2019   Weakness     Patient Active Problem List   Diagnosis Date Noted   Coronary artery disease of native artery of native heart with stable angina pectoris (HCC) 07/04/2021   Hypercholesteremia 07/04/2021   LAD stenosis 06/13/2021   Angina pectoris (HCC) 11/03/2019   Biliary dyskinesia 04/09/2012   Fever 04/01/2012   Transaminitis 04/01/2012   UTI (lower urinary tract infection) 04/01/2012   Leukopenia 04/01/2012   Thrombocytopenia (HCC) 04/01/2012   HTN (hypertension) 04/01/2012   GERD (gastroesophageal reflux disease) 04/01/2012   Oral thrush 04/01/2012    Past Surgical History:  Procedure Laterality Date   APPENDECTOMY  1963   CHOLECYSTECTOMY  04/26/2012   Procedure: LAPAROSCOPIC CHOLECYSTECTOMY WITH INTRAOPERATIVE CHOLANGIOGRAM;  Surgeon: Robyne Askew, MD;  Location: [facility];  Service: General;  Laterality: N/A;   CORONARY ANGIOGRAPHY N/A 07/19/2021   Procedure: CORONARY ANGIOGRAPHY (CATH LAB);  Surgeon:  Yates Decamp, MD;  Location: [facility] INVASIVE CV LAB;  Service: Cardiovascular;  Laterality: N/A;   CORONARY IMAGING/OCT N/A 06/14/2021   Procedure: INTRAVASCULAR IMAGING/OCT;  Surgeon: Yates Decamp, MD;  Location: [facility] INVASIVE CV LAB;  Service: Cardiovascular;  Laterality: N/A;   CORONARY PRESSURE/FFR STUDY N/A 07/19/2021   Procedure: INTRAVASCULAR PRESSURE WIRE/FFR STUDY;  Surgeon: Yates Decamp, MD;  Location: [facility] INVASIVE CV LAB;  Service: Cardiovascular;  Laterality: N/A;   CORONARY STENT INTERVENTION N/A 06/14/2021   Procedure: CORONARY STENT INTERVENTION;  Surgeon: Yates Decamp, MD;  Location: [facility] INVASIVE CV LAB;  Service: Cardiovascular;  Laterality: N/A;   DILATION AND CURETTAGE OF UTERUS  1982   for miscarriage   DILATION AND CURETTAGE, DIAGNOSTIC / THERAPEUTIC  1972   LEFT HEART CATH AND CORONARY ANGIOGRAPHY N/A 11/04/2019   Procedure: LEFT HEART CATH AND CORONARY ANGIOGRAPHY;  Surgeon: Yates Decamp, MD;  Location: [facility] INVASIVE CV LAB;  Service: Cardiovascular;  Laterality: N/A;   LEFT HEART CATH AND CORONARY ANGIOGRAPHY N/A 06/14/2021   Procedure: LEFT HEART CATH AND CORONARY ANGIOGRAPHY;  Surgeon: Yates Decamp, MD;  Location: [facility] INVASIVE CV LAB;  Service: Cardiovascular;  Laterality: N/A;   TONSILLECTOMY  1964  - approximate   WRIST FRACTURE SURGERY  2008   right    OB History   No obstetric history on file.      Home Medications    Prior to Admission medications   Medication Sig  Start Date End Date Taking? Authorizing Provider  cephALEXin (KEFLEX) 500 MG capsule Take 1 capsule (500 mg total) by mouth 2 (two) times daily for 7 days. 04/20/23 04/27/23 Yes Gustavus Bryant, FNP  aspirin EC 81 MG tablet Take 1 tablet (81 mg total) by mouth daily. 10/13/19   Yates Decamp, MD  bumetanide (BUMEX) 1 MG tablet Take 1 tablet (1 mg total) by mouth daily. 02/20/23 05/21/23  Yates Decamp, MD  Cyanocobalamin (VITAMIN B-12 PO) Take 1 tablet by mouth in the morning.    [provider]  EPINEPHrine 0.3 mg/0.3 mL IJ  SOAJ injection Inject 0.3 mg into the muscle once as needed for anaphylaxis.    [provider]  esomeprazole (NEXIUM) 40 MG capsule Take 40 mg by mouth in the morning.    [provider]  gabapentin (NEURONTIN) 300 MG capsule Take 300 mg by mouth at bedtime. 02/14/21   [provider]  isosorbide dinitrate (ISORDIL) 20 MG tablet TAKE 1 TABLET BY MOUTH THREE TIMES A DAY 03/30/23   Yates Decamp, MD  labetalol (NORMODYNE) 200 MG tablet TAKE 1 TABLET BY MOUTH TWICE A DAY Patient taking differently: Take 100 mg by mouth 2 (two) times daily. 11/24/21   Yates Decamp, MD  magnesium 30 MG tablet Take 30 mg by mouth 2 (two) times daily.    [provider]  nitroGLYCERIN (NITROSTAT) 0.4 MG SL tablet Place 1 tablet (0.4 mg total) under the tongue every 5 (five) minutes as needed for up to 25 days for chest pain. 03/26/23 04/20/23  Yates Decamp, MD  potassium chloride SA (KLOR-CON M) 20 MEQ tablet Take 1 tablet (20 mEq total) by mouth 2 (two) times daily for 3 days. 04/03/23 04/06/23  Trinna Post, MD  Probiotic Product (ALIGN PO) Take by mouth.    [provider]  propranolol (INDERAL) 20 MG tablet TAKE 1 TABLET BY MOUTH THREE TIMES A DAY 03/15/23   Yates Decamp, MD  rosuvastatin (CRESTOR) 10 MG tablet TAKE 1 TABLET BY MOUTH EVERY DAY 07/03/22   Yates Decamp, MD  sodium chloride (OCEAN) 0.65 % SOLN nasal spray Place 1 spray into both nostrils as needed for congestion.    [provider]  triamcinolone (KENALOG) 0.025 % cream Apply 1 Application topically 2 (two) times daily as needed. 05/08/22   Bing Neighbors, NP  triamterene-hydrochlorothiazide (MAXZIDE-25) 37.5-25 MG tablet Take 1 tablet by mouth in the morning. 01/22/17   [provider]  VITAMIN D PO Take 1 capsule by mouth every other day. In the morning    [provider]    Family History Family History  Problem Relation Age of Onset   Cardiomyopathy Mother    Coronary artery disease Father     Hypertension Brother    Prostate cancer Brother     Social History Social History   Tobacco Use   Smoking status: Never   Smokeless tobacco: Never  Vaping Use   Vaping status: Never Used  Substance Use Topics   Alcohol use: No   Drug use: No     Allergies   Ace inhibitors, Bee venom, Thorazine [chlorpromazine], Wasp venom, Levaquin [levofloxacin in d5w], Shrimp [shellfish allergy], Amlodipine, Atropine sulfate, Elemental sulfur, Glycopyrrolate, Ramipril, Sulfa antibiotics, Compazine [prochlorperazine edisylate], and Latex   Review of Systems Review of Systems Per HPI  Physical Exam Triage Vital Signs ED Triage Vitals  Encounter Vitals Group     BP 04/20/23 1424 126/63     Systolic BP Percentile --  Diastolic BP Percentile --      Pulse Rate 04/20/23 1424 70     Resp 04/20/23 1424 16     Temp 04/20/23 1424 97.6 F (36.4 C)     Temp Source 04/20/23 1424 Oral     SpO2 04/20/23 1424 96 %     Weight --      Height --      Head Circumference --      Peak Flow --      Pain Score 04/20/23 1426 0     Pain Loc --      Pain Education --      Exclude from Growth Chart --    No data found.  Updated Vital Signs BP 126/63 (BP Location: Left Arm)   Pulse 70   Temp 97.6 F (36.4 C) (Oral)   Resp 16   SpO2 96%   Visual Acuity Right Eye Distance:   Left Eye Distance:   Bilateral Distance:    Right Eye Near:   Left Eye Near:    Bilateral Near:     Physical Exam Constitutional:      General: She is not in acute distress.    Appearance: Normal appearance. She is not toxic-appearing or diaphoretic.  HENT:     Head: Normocephalic and atraumatic.  Eyes:     Extraocular Movements: Extraocular movements intact.     Conjunctiva/sclera: Conjunctivae normal.  Pulmonary:     Effort: Pulmonary effort is normal.  Neurological:     General: No focal deficit present.     Mental Status: She is alert and oriented to person, place, and time. Mental status is at baseline.   Psychiatric:        Mood and Affect: Mood normal.        Behavior: Behavior normal.        Thought Content: Thought content normal.        Judgment: Judgment normal.      UC Treatments / Results  Labs (all labs ordered are listed, but only abnormal results are displayed) Labs Reviewed  POCT URINALYSIS DIP (MANUAL ENTRY) - Abnormal; Notable for the following components:      Result Value   Clarity, UA cloudy (*)    Blood, UA moderate (*)    Leukocytes, UA Small (1+) (*)    All other components within normal limits  URINE CULTURE    EKG   Radiology No results found.  Procedures Procedures (including critical care time)  Medications Ordered in UC Medications - No data to display  Initial Impression / Assessment and Plan / UC Course  I have reviewed the triage vital signs and the nursing notes.  Pertinent labs & imaging results that were available during my care of the patient were reviewed by me and considered in my medical decision making (see chart for details).     UA indicating UTI.  Will treat with cephalexin.  Last creatinine clearance is 62 so no dosage adjustment necessary.  Urine culture pending.  Patient advised to follow-up if any symptoms persist or worsen.  Patient verbalized understanding and was agreeable with plan. Final Clinical Impressions(s) / UC Diagnoses   Final diagnoses:  Acute cystitis with hematuria  Dysuria  Urinary frequency     Discharge Instructions      I have prescribed you an antibiotic for UTI.  Please follow-up if any symptoms persist or worsen.  Urine culture is pending.    ED Prescriptions     Medication Sig  Dispense Auth. Provider   cephALEXin (KEFLEX) 500 MG capsule Take 1 capsule (500 mg total) by mouth 2 (two) times daily for 7 days. 14 capsule [place], Acie Fredrickson, [place]      PDMP not reviewed this encounter.   Gustavus Bryant, [place] 04/20/23 1539

## 2023-04-20 NOTE — Discharge Instructions (Signed)
I have prescribed you an antibiotic for UTI.  Please follow-up if any symptoms persist or worsen.  Urine culture is pending.

## 2023-04-20 NOTE — ED Triage Notes (Signed)
Pt states pain with urination since this morning. States she has a history of frequent UTI's.

## 2023-04-25 ENCOUNTER — Encounter
Admission: RE | Admit: 2023-04-25 | Discharge: 2023-04-25 | Disposition: A | Discharge status: Home / Self Care | Payer: Medicare Other | Source: Ambulatory Visit | Attending: Surgery | Admitting: Surgery

## 2023-04-25 HISTORY — DX: Irritable bowel syndrome, unspecified: K58.9

## 2023-04-25 HISTORY — DX: Angina pectoris, unspecified: I20.9

## 2023-04-25 HISTORY — DX: Anemia, unspecified: D64.9

## 2023-04-25 HISTORY — DX: Pure hypercholesterolemia, unspecified: E78.00

## 2023-04-25 HISTORY — DX: Elevation of levels of liver transaminase levels: R74.01

## 2023-04-25 HISTORY — DX: Varicose veins of bilateral lower extremities with other complications: I83.893

## 2023-04-25 HISTORY — DX: Decreased white blood cell count, unspecified: D72.819

## 2023-04-25 HISTORY — DX: Thrombocytopenia, unspecified: D69.6

## 2023-04-25 HISTORY — DX: Trochanteric bursitis, right hip: M70.61

## 2023-04-25 NOTE — Patient Instructions (Addendum)
Your procedure is scheduled on: 05/03/23 - Thursday Report to the Registration Desk on the 1st floor of the [facility]. To find out your arrival time, please call [phone] between 1PM - 3PM on: 05/02/23 - Wednesday If your arrival time is 6:00 am, do not arrive before that time as the [facility] entrance doors do not open until 6:00 am.  REMEMBER: Instructions that are not followed completely may result in serious medical risk, up to and including death; or upon the discretion of your surgeon and anesthesiologist your surgery may need to be rescheduled.  Do not eat food after midnight the night before surgery.  No gum chewing or hard candies.  You may however, drink CLEAR liquids up to 2 hours before you are scheduled to arrive for your surgery. Do not drink anything within 2 hours of your scheduled arrival time.  Clear liquids include: - water  - apple juice without pulp - gatorade (not RED colors) - black coffee or tea (Do NOT add milk or creamers to the coffee or tea) Do NOT drink anything that is not on this list.   One week prior to surgery: Stop Anti-inflammatories (NSAIDS) such as Advil, Aleve, Ibuprofen, Motrin, Naproxen, Naprosyn and Aspirin based products such as Excedrin, Goody's Powder, BC Powder.  Stop ANY OVER THE COUNTER supplements until after surgery.  You may however, continue to take Tylenol if needed for pain up until the day of surgery.   TAKE ONLY THESE MEDICATIONS THE MORNING OF SURGERY WITH A SIP OF WATER:  esomeprazole (NEXIUM) - (take one the night before and one on the morning of surgery - helps to prevent nausea after surgery.) isosorbide dinitrate (ISORDIL)  propranolol (INDERAL)  gabapentin (NEURONTIN)    No Alcohol for 24 hours before or after surgery.  No Smoking including e-cigarettes for 24 hours before surgery.  No chewable tobacco products for at least 6 hours before surgery.  No nicotine patches on the day of surgery.  Do not  use any "recreational" drugs for at least a week (preferably 2 weeks) before your surgery.  Please be advised that the combination of cocaine and anesthesia may have negative outcomes, up to and including death. If you test positive for cocaine, your surgery will be cancelled.  On the morning of surgery brush your teeth with toothpaste and water, you may rinse your mouth with mouthwash if you wish. Do not swallow any toothpaste or mouthwash.  Use CHG Soap or wipes as directed on instruction sheet.  Do not wear jewelry, make-up, hairpins, clips or nail polish.  Do not wear lotions, powders, or perfumes.   Do not shave body hair from the neck down 48 hours before surgery.  Contact lenses, hearing aids and dentures may not be worn into surgery.  Do not bring valuables to the hospital. [facility] is not responsible for any missing/lost belongings or valuables.   Notify your doctor if there is any change in your medical condition (cold, fever, infection).  Wear comfortable clothing (specific to your surgery type) to the hospital.  After surgery, you can help prevent lung complications by doing breathing exercises.  Take deep breaths and cough every 1-2 hours. Your doctor may order a device called an Incentive Spirometer to help you take deep breaths. When coughing or sneezing, hold a pillow firmly against your incision with both hands. This is called "splinting." Doing this helps protect your incision. It also decreases belly discomfort.  If you are being admitted to the  hospital overnight, leave your suitcase in the car. After surgery it may be brought to your room.  In case of increased patient census, it may be necessary for you, the patient, to continue your postoperative care in the Same Day Surgery department.  If you are being discharged the day of surgery, you will not be allowed to drive home. You will need a responsible individual to drive you home and stay with you for 24 hours  after surgery.   If you are taking public transportation, you will need to have a responsible individual with you.  Please call the Pre-admissions Testing Dept. at [phone] if you have any questions about these instructions.  Surgery Visitation Policy:  Patients having surgery or a procedure may have two visitors.  Children under the age of 76 must have an adult with them who is not the patient.  Inpatient Visitation:    Visiting hours are 7 a.m. to 8 p.m. Up to four visitors are allowed at one time in a patient room. The visitors may rotate out with other people during the day.  One visitor age 22 or older may stay with the patient overnight and must be in the room by 8 p.m.    Preparing for Surgery with CHLORHEXIDINE GLUCONATE (CHG) Soap  Chlorhexidine Gluconate (CHG) Soap  o An antiseptic cleaner that kills germs and bonds with the skin to continue killing germs even after washing  o Used for showering the night before surgery and morning of surgery  Before surgery, you can play an important role by reducing the number of germs on your skin.  CHG (Chlorhexidine gluconate) soap is an antiseptic cleanser which kills germs and bonds with the skin to continue killing germs even after washing.  Please do not use if you have an allergy to CHG or antibacterial soaps. If your skin becomes reddened/irritated stop using the CHG.  1. Shower the NIGHT BEFORE SURGERY and the MORNING OF SURGERY with CHG soap.  2. If you choose to wash your hair, wash your hair first as usual with your normal shampoo.  3. After shampooing, rinse your hair and body thoroughly to remove the shampoo.  4. Use CHG as you would any other liquid soap. You can apply CHG directly to the skin and wash gently with a scrungie or a clean washcloth.  5. Apply the CHG soap to your body only from the neck down. Do not use on open wounds or open sores. Avoid contact with your eyes, ears, mouth, and genitals (private  parts). Wash face and genitals (private parts) with your normal soap.  6. Wash thoroughly, paying special attention to the area where your surgery will be performed.  7. Thoroughly rinse your body with warm water.  8. Do not shower/wash with your normal soap after using and rinsing off the CHG soap.  9. Pat yourself dry with a clean towel.  10. Wear clean pajamas to bed the night before surgery.  12. Place clean sheets on your bed the night of your first shower and do not sleep with pets.  13. Shower again with the CHG soap on the day of surgery prior to arriving at the hospital.  14. Do not apply any deodorants/lotions/powders.  15. Please wear clean clothes to the hospital.

## 2023-04-26 ENCOUNTER — Other Ambulatory Visit: Payer: Self-pay

## 2023-04-30 ENCOUNTER — Other Ambulatory Visit: Payer: Self-pay

## 2023-04-30 ENCOUNTER — Encounter: Payer: Self-pay | Admitting: Emergency Medicine

## 2023-04-30 ENCOUNTER — Encounter: Payer: Self-pay | Admitting: Surgery

## 2023-04-30 ENCOUNTER — Emergency Department
Admission: EM | Admit: 2023-04-30 | Discharge: 2023-04-30 | Discharge status: Against Medical Advice | Payer: Medicare Other | Attending: Emergency Medicine | Admitting: Emergency Medicine

## 2023-04-30 DIAGNOSIS — R1031 Right lower quadrant pain: Secondary | ICD-10-CM | POA: Diagnosis not present

## 2023-04-30 DIAGNOSIS — Z5321 Procedure and treatment not carried out due to patient leaving prior to being seen by health care provider: Secondary | ICD-10-CM | POA: Diagnosis not present

## 2023-04-30 LAB — COMPREHENSIVE METABOLIC PANEL
ALT: 11 U/L (ref 0–44)
AST: 17 U/L (ref 15–41)
Albumin: 3.2 g/dL — ABNORMAL LOW (ref 3.5–5.0)
Alkaline Phosphatase: 73 U/L (ref 38–126)
Anion gap: 9 (ref 5–15)
BUN: 11 mg/dL (ref 8–23)
CO2: 27 mmol/L (ref 22–32)
Calcium: 8.8 mg/dL — ABNORMAL LOW (ref 8.9–10.3)
Chloride: 104 mmol/L (ref 98–111)
Creatinine, Ser: 0.94 mg/dL (ref 0.44–1.00)
GFR, Estimated: >60 mL/min (ref >60)
Glucose, Bld: 101 mg/dL — ABNORMAL HIGH (ref 70–99)
Potassium: 3.3 mmol/L — ABNORMAL LOW (ref 3.5–5.1)
Sodium: 140 mmol/L (ref 135–145)
Total Bilirubin: 0.9 mg/dL (ref 0.3–1.2)
Total Protein: 6.7 g/dL (ref 6.5–8.1)

## 2023-04-30 LAB — CBC
HCT: 36.8 % (ref 36.0–46.0)
Hemoglobin: 11.9 g/dL — ABNORMAL LOW (ref 12.0–15.0)
MCH: 28.7 pg (ref 26.0–34.0)
MCHC: 32.3 g/dL (ref 30.0–36.0)
MCV: 88.9 fL (ref 80.0–100.0)
Platelets: 208 10*3/uL (ref 150–400)
RBC: 4.14 MIL/uL (ref 3.87–5.11)
RDW: 13.4 % (ref 11.5–15.5)
WBC: 5.2 10*3/uL (ref 4.0–10.5)
nRBC: 0 % (ref 0.0–0.2)

## 2023-04-30 LAB — LIPASE, BLOOD: Lipase: 28 U/L (ref 11–51)

## 2023-04-30 NOTE — Progress Notes (Signed)
Perioperative / Anesthesia Services  Pre-Admission Testing Clinical Review / Preoperative Anesthesia Consult  Date: 04/30/23  Patient Demographics:  Name: Amanda Lambert DOB:   January 23, 1946 MRN:   [id number]  Planned Surgical Procedure(s):    Case: [id number] Date/Time: 05/03/23 1306   Procedure: XI ROBOTIC ASSISTED INGUINAL HERNIA w/ mesh (Inguinal)   Anesthesia type: General   Pre-op diagnosis: non-recurrent unilateral inguinal hernia w/o obstruction or gangrene K40.90   Location: [facility] OR ROOM 07 / [facility]   Surgeons: Sung Amabile, DO     NOTE: Available PAT nursing documentation and vital signs have been reviewed. Clinical nursing staff has updated patient's PMH/PSHx, current medication list, and drug allergies/intolerances to ensure comprehensive history available to assist in medical decision making as it pertains to the aforementioned surgical procedure and anticipated anesthetic course. Extensive review of available clinical information personally performed. [place] PMH and PSHx updated with any diagnoses/procedures that  may have been inadvertently omitted during her intake with the pre-admission testing department's nursing staff.  Clinical Discussion:  CHYANNA Lambert is a 77 y.o. female who is submitted for pre-surgical anesthesia review and clearance prior to her undergoing the above procedure. Patient has never been a smoker. Pertinent PMH includes: CAD, diastolic dysfunction, BILATERAL carotid artery stenosis, LBBB, angina, aortic atherosclerosis, HTN, HLD, CKD-III, GERD (on daily PPI), anemia, thrombocytopenia, HBV, OA, inguinal hernia.  Patient is followed by cardiology Jacinto Halim, MD). She was last seen in the cardiology clinic on 02/20/2023; notes reviewed. At the time of her clinic visit, patient doing well overall from a cardiovascular perspective.  Patient reporting chronic peripheral edema that was noted to be stable overall.  Patient denied any chest  pain, shortness of breath, PND, orthopnea, palpitations, weakness, fatigue, vertiginous symptoms, or presyncope/syncope. Patient with a past medical history significant for cardiovascular diagnoses. Documented physical exam was grossly benign, providing no evidence of acute exacerbation and/or decompensation of the patient's known cardiovascular conditions.  Myocardial perfusion imaging study performed on 10/20/2019 revealing a normal left ventricular systolic function with a stress EF of 61%.  There was a large fixed perfusion defect located in the mid anteroseptal, mid inferoseptal, basal anteroseptal, and basal inferoseptal walls.  Findings consistent with LBBB.  Mild ischemia cannot be completely excluded.  Study determined to be low risk overall.  Patient underwent diagnostic LEFT heart catheterization on 11/04/2019 revealing single-vessel CAD.  There was 50% stenosis noted in the mid LAD.  Given the nonobstructive nature of her coronary artery disease, the decision was made to defer intervention opting for medical management.  Most recent TTE was performed on 05/19/2021 revealing a normal left ventricular systolic function with an EF of 50-55%.  There was mild basal septal hypertrophy.  There were no regional wall motion abnormalities. Left ventricular diastolic Doppler parameters consistent with abnormal relaxation (G1DD).  Trivial mitral and tricuspid valve regurgitation noted.  Of transvalvular gradients were noted to be normal providing no evidence suggestive of valvular stenosis.  Aorta normal in size with no evidence of aneurysmal dilatation.  Due to recurrent angina, patient underwent repeat LEFT heart catheterization on 06/14/2021 revealing 40% stenosis in the proximal to mid LAD and 80% stenosis in the mid LAD.  PCI was performed placing a 3.0 x 30 mm Onyx Frontier DES to the mid LAD lesion.  Procedure yielded excellent angiographic result and TIMI-3 flow.  Coronary angiography study was  performed on 07/19/2021 to further assess coronary pressures (FFR).  Study demonstrated a widely patent stent within the  mid LAD.  Just prior to the stent, the proximal segment at the bifurcation of D1 and a large septal perforator there was a 40% stenosis.  RFR was 0.92, FFR 0.89, CFR 4.8, and INR 18.  Findings not suggestive of micro/macrovascular disease.  Patient with a history of BILATERAL carotid artery disease.  Most recent carotid Doppler was performed on 03/14/2023 revealing 50-69% stenosis of the RICA, with 16-49% contralateral stenosis noted in the LICA.  Vertebral arteries demonstrated antegrade flow.  Normal flow hemodynamics noted in the subclavians.  Blood pressure well controlled at 121/71 mmHg on currently prescribed diuretic (furosemide), nitrate (isosorbide mononitrate) and beta-blocker (propranolol) therapies.  In addition to his scheduled nitrates, patient has a supply of short acting nitrates) NTG) to use on a as needed basis for recurrent angina/anginal equivalent symptoms; denied recent use.  Patient is not diabetic.  Patient does not have an OSAH diagnosis. Patient is able to complete all of her  ADL/IADLs without cardiovascular limitation.  Per the DASI, patient is able to achieve at least 4 METS of physical activity without experiencing any significant degree of angina/anginal equivalent symptoms.  Due to her peripheral edema, furosemide was determined to be ineffective, and therefore discontinued.  Patient was placed on bumetanide.  No other changes made to her medication regimen.  Patient to follow-up with outpatient cardiology in 12 months or sooner if needed.  Amanda Lambert is scheduled for an elective XI ROBOTIC ASSISTED INGUINAL HERNIA w/ mesh (Inguinal) on 05/03/2023 with Dr. Sung Amabile, DO. Given patient's past medical history significant for cardiovascular diagnoses, presurgical cardiac clearance was sought by the PAT team. Per cardiology, "this patient is optimized for  surgery and may proceed with the planned procedural course with a LOW risk of significant perioperative cardiovascular complications without the need for further cardiovascular testing".  In review of her medication reconciliation, it is noted that patient is currently on prescribed daily antithrombotic therapy. She has been instructed on recommendations for holding her low dose ASA for 5 days prior to her procedure with plans to restart as soon as postoperative bleeding risk felt to be minimized by her attending surgeon. The patient has been instructed that her last dose of her ASA should be on 04/27/2023.  Patient reports previous perioperative complications with anesthesia in the past. Patient has a PMH (+) for PONV. Symptoms and history of PONV will be discussed with patient by anesthesia team on the day of her procedure. Interventions will be ordered as deemed necessary based on patient's individual care needs as determined by anesthesiologist. Patient has a history of (+) delayed emergence from anesthesia. In review of the available records, it is noted that patient underwent a general anesthetic course at [facility]  (ASA II) in 04/2012 without documented complications.      04/20/2023    2:24 PM 04/03/2023    9:00 PM 04/03/2023    8:30 PM  Vitals with BMI  Systolic 126 135 130  Diastolic 63 76 72  Pulse 70 70 71    Providers/Specialists:   NOTE: Primary physician provider listed below. Patient may have been seen by APP or partner within same practice.   PROVIDER ROLE / SPECIALTY LAST Michelle Nasuti, DO General Surgery (Surgeon) 04/17/2023  Collene Mares, [place] Primary Care Provider 04/12/2023  Yates Decamp, MD Cardiology 02/20/2023   Allergies:  Ace inhibitors, Bee venom, Thorazine [chlorpromazine], Wasp venom, Levaquin [levofloxacin in d5w], Shrimp [shellfish allergy], Amlodipine, Atropine sulfate, Elemental sulfur, Glycopyrrolate, Ramipril, Sulfa  antibiotics, Compazine  [prochlorperazine edisylate], and Latex  Current Home Medications:   No current facility-administered medications for this encounter.    aspirin EC 81 MG tablet   bumetanide (BUMEX) 1 MG tablet   chlordiazePOXIDE-Clidinium (LIBRAX PO)   Cyanocobalamin (VITAMIN B-12 PO)   diphenhydrAMINE HCl (BENADRYL PO)   EPINEPHrine 0.3 mg/0.3 mL IJ SOAJ injection   esomeprazole (NEXIUM) 40 MG capsule   gabapentin (NEURONTIN) 300 MG capsule   isosorbide dinitrate (ISORDIL) 20 MG tablet   labetalol (NORMODYNE) 200 MG tablet   magnesium 30 MG tablet   naproxen sodium (ALEVE) 220 MG tablet   nitroGLYCERIN (NITROSTAT) 0.4 MG SL tablet   potassium chloride SA (KLOR-CON M) 20 MEQ tablet   Probiotic Product (ALIGN PO)   propranolol (INDERAL) 20 MG tablet   rosuvastatin (CRESTOR) 10 MG tablet   sodium chloride (OCEAN) 0.65 % SOLN nasal spray   triamcinolone (KENALOG) 0.025 % cream   triamterene-hydrochlorothiazide (MAXZIDE-25) 37.5-25 MG tablet   VITAMIN D PO   History:   Past Medical History:  Diagnosis Date   Anemia    Angina pectoris (HCC)    Arthritis    Bilateral carotid artery disease (HCC) 10/15/2019   a.) carotid doppler 10/15/2019: 50-69% BICA; b.) carotid doppler 04/06/2020: 50-69% RICA, 1-15% LICA; c.) carotid doppler 11/03/2020 and 04/29/2021: 50-69% RICA, 16-49% LICA; d.) carotid doppler 03/02/2022: 50-69% BICA; e.) carotid doppler 03/14/2023: 50-69% RICA, 16-49% LICA   Biliary dyskinesia    CAD (coronary artery disease) 06/14/2021   a.) LHC 11/04/2019: 50% mLAD - med mgmt; b.) LHC/PCI 06/14/2021: 40% p-mLAD, 80% mLAD (3.0 x 30 mm Onyx Frontier DES; c.) LHC for FFR study: 07/19/2021: 40% pLAD; RFR 0.92, FFR 0.89, CFR 4.8, IMR 18 --> no significant macro/microvascular disease   CKD (chronic kidney disease), stage III (HCC)    Complication of anesthesia    a.) PONV; b.) delayed emergence   Constipation    Diastolic dysfunction 10/15/2019   a.) TTE 10/15/2019: EF 50-55%, norm LAP,  mild LA dil, mod MR, mild-mod TR, G1DD; b.) TTE 05/19/2021: EF 50-55%, mild basal sep hypertrophy, norm LAP, triv MR/TR, G1DD   GERD (gastroesophageal reflux disease)    Hepatitis B 1975   Hypercholesteremia    Hypertension    Irritable bowel syndrome without diarrhea    LBBB (left bundle branch block)    Leukopenia    Long term current use of aspirin    PONV (postoperative nausea and vomiting)    [facility] spotted fever 04/01/2012   adm. to hospital   Syncope 10/17/2019   Thrombocytopenia (HCC)    Transaminitis    Trochanteric bursitis, right hip    Varicose veins of leg with edema, bilateral    Weakness    Past Surgical History:  Procedure Laterality Date   APPENDECTOMY  09/25/1961   CHOLECYSTECTOMY  04/26/2012   Procedure: LAPAROSCOPIC CHOLECYSTECTOMY WITH INTRAOPERATIVE CHOLANGIOGRAM;  Surgeon: Robyne Askew, MD;  Location: [facility] ORS;  Service: General;  Laterality: N/A;   COLONOSCOPY     CORONARY ANGIOGRAPHY N/A 07/19/2021   Procedure: CORONARY ANGIOGRAPHY (CATH LAB);  Surgeon: Yates Decamp, MD;  Location: [facility] INVASIVE CV LAB;  Service: Cardiovascular;  Laterality: N/A;   CORONARY IMAGING/OCT N/A 06/14/2021   Procedure: INTRAVASCULAR IMAGING/OCT;  Surgeon: Yates Decamp, MD;  Location: [facility] INVASIVE CV LAB;  Service: Cardiovascular;  Laterality: N/A;   CORONARY PRESSURE/FFR STUDY N/A 07/19/2021   Procedure: INTRAVASCULAR PRESSURE WIRE/FFR STUDY;  Surgeon: Yates Decamp, MD;  Location: [facility] INVASIVE CV LAB;  Service: Cardiovascular;  Laterality: N/A;   CORONARY STENT INTERVENTION N/A 06/14/2021   Procedure: CORONARY STENT INTERVENTION;  Surgeon: Yates Decamp, MD;  Location: [facility] INVASIVE CV LAB;  Service: Cardiovascular;  Laterality: N/A;   DILATION AND CURETTAGE OF UTERUS  09/25/1980   for miscarriage   DILATION AND CURETTAGE, DIAGNOSTIC / THERAPEUTIC  09/25/1970   LEFT HEART CATH AND CORONARY ANGIOGRAPHY N/A 11/04/2019   Procedure: LEFT HEART CATH AND CORONARY ANGIOGRAPHY;  Surgeon:  Yates Decamp, MD;  Location: [facility] INVASIVE CV LAB;  Service: Cardiovascular;  Laterality: N/A;   LEFT HEART CATH AND CORONARY ANGIOGRAPHY N/A 06/14/2021   Procedure: LEFT HEART CATH AND CORONARY ANGIOGRAPHY;  Surgeon: Yates Decamp, MD;  Location: [facility] INVASIVE CV LAB;  Service: Cardiovascular;  Laterality: N/A;   TONSILLECTOMY  1964  - approximate   WRIST FRACTURE SURGERY  09/25/2006   right   Family History  Problem Relation Age of Onset   Cardiomyopathy Mother    Coronary artery disease Father    Hypertension Brother    Prostate cancer Brother    Social History   Tobacco Use   Smoking status: Never   Smokeless tobacco: Never  Vaping Use   Vaping status: Never Used  Substance Use Topics   Alcohol use: No   Drug use: No    Pertinent Clinical Results:  LABS:  Lab Results  Component Value Date   WBC 7.3 04/03/2023   HGB 13.1 04/03/2023   HCT 39.8 04/03/2023   MCV 85.6 04/03/2023   PLT 285 04/03/2023   Lab Results  Component Value Date   NA 135 04/03/2023   K 2.8 (L) 04/03/2023   CO2 30 04/03/2023   GLUCOSE 111 (H) 04/03/2023   BUN 18 04/03/2023   CREATININE 1.08 (H) 04/03/2023   CALCIUM 9.9 04/03/2023   EGFR 52 (L) 06/30/2021   GFRNONAA 53 (L) 04/03/2023   Component Date Value Ref Range Status   Color, UA 04/20/2023 yellow  yellow Final   Clarity, UA 04/20/2023 cloudy (A)  clear Final   Glucose, UA 04/20/2023 negative  negative mg/dL Final   Bilirubin, UA 57/84/6962 negative  negative Final   Ketones, POC UA 04/20/2023 negative  negative mg/dL Final   Spec Grav, UA 95/28/4132 1.015  1.010 - 1.025 Final   Blood, UA 04/20/2023 moderate (A)  negative Final   pH, UA 04/20/2023 6.5  5.0 - 8.0 Final   Protein Ur, POC 04/20/2023 negative  negative mg/dL Final   Urobilinogen, UA 04/20/2023 0.2  0.2 or 1.0 E.U./dL Final   Nitrite, UA 44/09/270 Negative  Negative Final   Leukocytes, UA 04/20/2023 Small (1+) (A)  Negative Final   Specimen Description 04/20/2023 URINE, CLEAN  CATCH   Final   Special Requests 04/20/2023 NONE   Final   Culture 04/20/2023    Final                   Value:NO GROWTH Performed at [facility], [address] [place], [place] [address]    Report Status 04/20/2023 04/21/2023 FINAL   Final    ECG: Date: 02/20/2023 Time ECG obtained: 1455 PM Rate: 72 bpm Rhythm:  SR with 1st degree AV block with single PVC; LBBB Intervals: PR 222 ms. QRS 144 ms. QTc 451 ms. ST segment and T wave changes: No evidence of acute ST segment elevation or depression.  Comparison: Similar to previous tracing obtained on 02/16/2022   IMAGING / PROCEDURES: CT ABDOMEN PELVIS W CONTRAST performed on 04/03/2023  Focal area of small bowel wall thickening with surrounding inflammation involving the mid small bowel near the level of the umbilicus. Findings are concerning for focal enteritis. No bowel obstruction. Right lower quadrant Spigelian hernia containing a small amount of fat stranding and fluid, new from prior. Left renal cortical scarring. Left Bosniak II renal cyst, too small to characterize. No follow-up imaging is recommended. Aortic atherosclerosis.  CAROTID DUPLEX (BILATERAL) performed on 03/14/2023 Duplex suggests stenosis in the right internal carotid artery (50-69%).  Duplex suggests stenosis in the left internal carotid artery (16-49%).  Antegrade right vertebral artery flow.  Antegrade left vertebral artery flow.  Compared to the study done on 03/01/2022, no significant change.  Follow up in six months is appropriate if clinically indicated.   CORONARY PRESSURE/FFR STUDY performed on 07/19/2021 LM: Smooth and normal. CX: Smooth and normal. LAD:  Large vessel, gives origin to large D1.   Just after the origin of large D1 and a large septal perforator, there is a stent that was previously placed 06/14/2021, 3.0 x 30 mm Onyx is widely patent.   Prior to the stent, the proximal segment at the bifurcation of D1 and large septal perforator,  there is a 30" 40% stenosis. RFR was 0.92, FFR 0.89, CFR 4.8, IMR 18.  Findings do not suggest macrovascular or microvascular disease. RCA: Not studied, previously normal. Impression:  Symptoms of chest pain that is relieved with nitroglycerin, does not appear to be from cardiac etiology.   Will recommend performing a routine treadmill exercise stress test to see if there is any inducible ST segment changes.     LEFT HEART CATHETERIZATION AND CORONARY ANGIOGRAPHY performed on 06/14/2021 Normal left ventricular systolic function  LV: 130/3, EDP 9 mmHg. Ao 134/61, mean 91 mmHg.  There was no pressure gradient across the aortic valve.  CAD 40% p-mLAD 80% mLAD Successful PCI 3.0 x 30 mm Onyx Frontier DES    TRANSTHORACIC ECHOCARDIOGRAM performed on 05/19/2021 Normal LV systolic function with visual EF 50-55%. Left ventricle cavity is normal in size.  Mild basal septal hypertrophy.  Poor endocardial  visualization to accurately comment on regional wall motion; grossly normal global wall motion.  Grade I (impaired) diastolic dysfunction Normal LAP.  No significant valvular heart disease.  Compared to study 10/15/2019 Moderate MR and mild/moderate TR is now trace, otherwise no significant change.   MYOCARDIAL PERFUSION IMAGING STUDY (LEXISCAN) performed on 10/20/2019 Low risk. Nondiagnostic ECG stress. Mild degree large extent fixed perfusion defect located in the mid anteroseptal wall, mid inferoseptal wall, basal anteroseptal wall and basal inferoseptal wall. This is consistent with LBBB, mild ischemia cannot be completely excluded.  All segments of left ventricle demonstrated normal wall motion and thickening.  Stress LV EF is normal 61%.  No previous exam available for comparison.  Impression and Plan:  Maecy Schedler Oftedahl has been referred for pre-anesthesia review and clearance prior to her undergoing the planned anesthetic and procedural courses. Available labs, pertinent testing, and  imaging results were personally reviewed by me in preparation for upcoming operative/procedural course. [facility] medical record has been updated following extensive record review and patient interview with PAT staff.   This patient has been appropriately cleared by cardiology with an overall LOW risk of experiencing significant perioperative cardiovascular complications. Based on clinical review performed today (04/30/23), barring any significant acute changes in the patient's overall condition, it is anticipated that she will be able to proceed with the planned surgical intervention. Any acute changes in clinical condition may  necessitate her procedure being postponed and/or cancelled. Patient will meet with anesthesia team (MD and/or CRNA) on the day of her procedure for preoperative evaluation/assessment. Questions regarding anesthetic course will be fielded at that time.   Pre-surgical instructions were reviewed with the patient during her PAT appointment, and questions were fielded to satisfaction by PAT clinical staff. She has been instructed on which medications that she will need to hold prior to surgery, as well as the ones that have been deemed safe/appropriate to take on the day of her procedure. As part of the general education provided by PAT, patient made aware both verbally and in writing, that she would need to abstain from the use of any illegal substances during her perioperative course.  She was advised that failure to follow the provided instructions could necessitate case cancellation or result in serious perioperative complications up to and including death. Patient encouraged to contact PAT and/or her surgeon's office to discuss any questions or concerns that may arise prior to surgery; verbalized understanding.   Quentin Mulling, MSN, APRN, FNP-C, CEN [facility]  Peri-operative Services Nurse Practitioner Phone: [phone] Fax: [phone] 04/30/23 2:26  PM  NOTE: This note has been prepared using Dragon dictation software. Despite my best ability to proofread, there is always the potential that unintentional transcriptional errors may still occur from this process.

## 2023-04-30 NOTE — ED Triage Notes (Signed)
Patient to ED via POV for right sided lower abd pain. Has known hernia that is suppose to have surgery on Thursday. Patient states the pain is form her hernia and unable to get and relief from the pain.

## 2023-05-03 ENCOUNTER — Other Ambulatory Visit: Payer: Self-pay

## 2023-05-03 ENCOUNTER — Ambulatory Visit: Payer: Medicare Other | Admitting: Urgent Care

## 2023-05-03 ENCOUNTER — Encounter: Payer: Self-pay | Admitting: Surgery

## 2023-05-03 ENCOUNTER — Encounter
Admission: RE | Disposition: A | Discharge status: Home / Self Care | Payer: Self-pay | Source: Home / Self Care | Attending: Surgery

## 2023-05-03 ENCOUNTER — Ambulatory Visit
Admission: RE | Admit: 2023-05-03 | Discharge: 2023-05-03 | Disposition: A | Discharge status: Home / Self Care | Payer: Medicare Other | Attending: Surgery | Admitting: Surgery

## 2023-05-03 DIAGNOSIS — I081 Rheumatic disorders of both mitral and tricuspid valves: Secondary | ICD-10-CM | POA: Diagnosis not present

## 2023-05-03 DIAGNOSIS — K4090 Unilateral inguinal hernia, without obstruction or gangrene, not specified as recurrent: Secondary | ICD-10-CM | POA: Diagnosis not present

## 2023-05-03 DIAGNOSIS — I130 Hypertensive heart and chronic kidney disease with heart failure and stage 1 through stage 4 chronic kidney disease, or unspecified chronic kidney disease: Secondary | ICD-10-CM | POA: Insufficient documentation

## 2023-05-03 DIAGNOSIS — Z9049 Acquired absence of other specified parts of digestive tract: Secondary | ICD-10-CM | POA: Insufficient documentation

## 2023-05-03 DIAGNOSIS — K4030 Unilateral inguinal hernia, with obstruction, without gangrene, not specified as recurrent: Secondary | ICD-10-CM | POA: Insufficient documentation

## 2023-05-03 HISTORY — DX: Other complications of anesthesia, initial encounter: T88.59XA

## 2023-05-03 HISTORY — DX: Chronic kidney disease, stage 3 unspecified: N18.30

## 2023-05-03 HISTORY — DX: Long term (current) use of aspirin: Z79.82

## 2023-05-03 HISTORY — DX: Left bundle-branch block, unspecified: I44.7

## 2023-05-03 HISTORY — DX: Atherosclerosis of aorta: I70.0

## 2023-05-03 HISTORY — DX: Unilateral inguinal hernia, without obstruction or gangrene, not specified as recurrent: K40.90

## 2023-05-03 HISTORY — PX: INSERTION OF MESH: SHX5868

## 2023-05-03 SURGERY — HERNIORRHAPHY, INGUINAL, ROBOT-ASSISTED, LAPAROSCOPIC
Anesthesia: General | Site: Inguinal | Laterality: Right

## 2023-05-03 MED ORDER — ROCURONIUM BROMIDE 10 MG/ML (PF) SYRINGE
PREFILLED_SYRINGE | INTRAVENOUS | Status: AC
  Filled 2023-05-03: qty 10

## 2023-05-03 MED ORDER — SUGAMMADEX SODIUM 200 MG/2ML IV SOLN
INTRAVENOUS | Status: DC | PRN
  Administered 2023-05-03: 163.2 mg via INTRAVENOUS

## 2023-05-03 MED ORDER — ACETAMINOPHEN 500 MG PO TABS
1000.0000 mg | ORAL_TABLET | ORAL | Status: AC
  Administered 2023-05-03: 1000 mg via ORAL

## 2023-05-03 MED ORDER — ONDANSETRON HCL 4 MG/2ML IJ SOLN: 4.0000 mg | Freq: Once | INTRAMUSCULAR | Status: DC | PRN

## 2023-05-03 MED ORDER — BUPIVACAINE HCL (PF) 0.5 % IJ SOLN
INTRAMUSCULAR | Status: AC
  Filled 2023-05-03: qty 30

## 2023-05-03 MED ORDER — OXYCODONE HCL 5 MG PO TABS
5.0000 mg | ORAL_TABLET | Freq: Once | ORAL | Status: AC | PRN
  Administered 2023-05-03: 5 mg via ORAL

## 2023-05-03 MED ORDER — ORAL CARE MOUTH RINSE: 15.0000 mL | Freq: Once | OROMUCOSAL | Status: AC

## 2023-05-03 MED ORDER — CHLORHEXIDINE GLUCONATE 0.12 % MT SOLN
OROMUCOSAL | Status: AC
  Filled 2023-05-03: qty 15

## 2023-05-03 MED ORDER — CHLORHEXIDINE GLUCONATE CLOTH 2 % EX PADS
6.0000 | MEDICATED_PAD | Freq: Once | CUTANEOUS | Status: AC
  Administered 2023-05-03: 6 via TOPICAL

## 2023-05-03 MED ORDER — FENTANYL CITRATE (PF) 100 MCG/2ML IJ SOLN
INTRAMUSCULAR | Status: AC
  Filled 2023-05-03: qty 2

## 2023-05-03 MED ORDER — DEXAMETHASONE SODIUM PHOSPHATE 10 MG/ML IJ SOLN
INTRAMUSCULAR | Status: DC | PRN
  Administered 2023-05-03: 10 mg via INTRAVENOUS

## 2023-05-03 MED ORDER — MIDAZOLAM HCL 2 MG/2ML IJ SOLN
INTRAMUSCULAR | Status: DC | PRN
  Administered 2023-05-03: 2 mg via INTRAVENOUS

## 2023-05-03 MED ORDER — PROPOFOL 500 MG/50ML IV EMUL
INTRAVENOUS | Status: DC | PRN
  Administered 2023-05-03: 40 ug/kg/min via INTRAVENOUS

## 2023-05-03 MED ORDER — LIDOCAINE HCL (PF) 2 % IJ SOLN
INTRAMUSCULAR | Status: AC
  Filled 2023-05-03: qty 5

## 2023-05-03 MED ORDER — BUPIVACAINE LIPOSOME 1.3 % IJ SUSP
INTRAMUSCULAR | Status: AC
  Filled 2023-05-03: qty 20

## 2023-05-03 MED ORDER — 0.9 % SODIUM CHLORIDE (POUR BTL) OPTIME
TOPICAL | Status: DC | PRN
  Administered 2023-05-03: 500 mL

## 2023-05-03 MED ORDER — CEFAZOLIN SODIUM-DEXTROSE 2-4 GM/100ML-% IV SOLN
INTRAVENOUS | Status: AC
  Filled 2023-05-03: qty 100

## 2023-05-03 MED ORDER — OXYCODONE HCL 5 MG/5ML PO SOLN: 5.0000 mg | Freq: Once | ORAL | Status: AC | PRN

## 2023-05-03 MED ORDER — PROPOFOL 10 MG/ML IV BOLUS
INTRAVENOUS | Status: AC
  Filled 2023-05-03: qty 20

## 2023-05-03 MED ORDER — MIDAZOLAM HCL 2 MG/2ML IJ SOLN
INTRAMUSCULAR | Status: AC
  Filled 2023-05-03: qty 2

## 2023-05-03 MED ORDER — ACETAMINOPHEN 500 MG PO TABS
ORAL_TABLET | ORAL | Status: AC
  Filled 2023-05-03: qty 2

## 2023-05-03 MED ORDER — FENTANYL CITRATE (PF) 100 MCG/2ML IJ SOLN
25.0000 ug | INTRAMUSCULAR | Status: DC | PRN
  Administered 2023-05-03: 25 ug via INTRAVENOUS
  Administered 2023-05-03: 50 ug via INTRAVENOUS
  Administered 2023-05-03: 25 ug via INTRAVENOUS

## 2023-05-03 MED ORDER — BUPIVACAINE LIPOSOME 1.3 % IJ SUSP
INTRAMUSCULAR | Status: DC | PRN
  Administered 2023-05-03: 20 mL

## 2023-05-03 MED ORDER — EPINEPHRINE PF 1 MG/ML IJ SOLN
INTRAMUSCULAR | Status: AC
  Filled 2023-05-03: qty 1

## 2023-05-03 MED ORDER — ONDANSETRON HCL 4 MG/2ML IJ SOLN
INTRAMUSCULAR | Status: DC | PRN
Start: 2023-05-03 — End: 2023-05-03
  Administered 2023-05-03: 4 mg via INTRAVENOUS

## 2023-05-03 MED ORDER — ROCURONIUM BROMIDE 100 MG/10ML IV SOLN
INTRAVENOUS | Status: DC | PRN
  Administered 2023-05-03: 40 mg via INTRAVENOUS

## 2023-05-03 MED ORDER — LIDOCAINE HCL (CARDIAC) PF 100 MG/5ML IV SOSY
PREFILLED_SYRINGE | INTRAVENOUS | Status: DC | PRN
  Administered 2023-05-03: 80 mg via INTRAVENOUS

## 2023-05-03 MED ORDER — CHLORHEXIDINE GLUCONATE 0.12 % MT SOLN
15.0000 mL | Freq: Once | OROMUCOSAL | Status: AC
  Administered 2023-05-03: 15 mL via OROMUCOSAL

## 2023-05-03 MED ORDER — PROPOFOL 1000 MG/100ML IV EMUL
INTRAVENOUS | Status: AC
  Filled 2023-05-03: qty 100

## 2023-05-03 MED ORDER — PROPOFOL 10 MG/ML IV BOLUS
INTRAVENOUS | Status: DC | PRN
Start: 2023-05-03 — End: 2023-05-03
  Administered 2023-05-03: 30 mg via INTRAVENOUS
  Administered 2023-05-03: 140 mg via INTRAVENOUS

## 2023-05-03 MED ORDER — FENTANYL CITRATE (PF) 100 MCG/2ML IJ SOLN
INTRAMUSCULAR | Status: DC | PRN
  Administered 2023-05-03 (×2): 50 ug via INTRAVENOUS

## 2023-05-03 MED ORDER — ACETAMINOPHEN 325 MG PO TABS: 650.0000 mg | ORAL_TABLET | Freq: Three times a day (TID) | ORAL | 0 refills | Status: AC | PRN

## 2023-05-03 MED ORDER — BUPIVACAINE-EPINEPHRINE 0.5% -1:200000 IJ SOLN
INTRAMUSCULAR | Status: DC | PRN
  Administered 2023-05-03: 20 mL

## 2023-05-03 MED ORDER — OXYCODONE HCL 5 MG PO TABS
ORAL_TABLET | ORAL | Status: AC
  Filled 2023-05-03: qty 1

## 2023-05-03 MED ORDER — CEFAZOLIN SODIUM-DEXTROSE 2-4 GM/100ML-% IV SOLN
2.0000 g | INTRAVENOUS | Status: AC
  Administered 2023-05-03: 2 g via INTRAVENOUS

## 2023-05-03 MED ORDER — LACTATED RINGERS IV SOLN: INTRAVENOUS | Status: DC

## 2023-05-03 MED ORDER — OXYCODONE-ACETAMINOPHEN 5-325 MG PO TABS: 1.0000 | ORAL_TABLET | Freq: Three times a day (TID) | ORAL | 0 refills | Status: DC | PRN

## 2023-05-03 SURGICAL SUPPLY — 51 items
ADH SKN CLS APL DERMABOND .7 (GAUZE/BANDAGES/DRESSINGS) ×2
BAG PRESSURE INF REUSE 1000 (BAG) IMPLANT
BLADE SURG SZ11 CARB STEEL (BLADE) ×2 IMPLANT
BNDG GAUZE DERMACEA FLUFF 4 (GAUZE/BANDAGES/DRESSINGS) ×2 IMPLANT
BNDG GZE DERMACEA 4 6PLY (GAUZE/BANDAGES/DRESSINGS)
COVER TIP SHEARS 8 DVNC (MISCELLANEOUS) ×2 IMPLANT
COVER WAND RF STERILE (DRAPES) ×2 IMPLANT
DERMABOND ADVANCED .7 DNX12 (GAUZE/BANDAGES/DRESSINGS) ×2 IMPLANT
DRAPE ARM DVNC X/XI (DISPOSABLE) ×6 IMPLANT
DRAPE COLUMN DVNC XI (DISPOSABLE) ×2 IMPLANT
ELECT CAUTERY BLADE 6.4 (BLADE) IMPLANT
ELECT REM PT RETURN 9FT ADLT (ELECTROSURGICAL) ×2
ELECTRODE REM PT RTRN 9FT ADLT (ELECTROSURGICAL) ×2 IMPLANT
FORCEPS BPLR FENES DVNC XI (FORCEP) ×2 IMPLANT
GLOVE BIOGEL PI IND STRL 7.0 (GLOVE) ×4 IMPLANT
GLOVE SURG SYN 6.5 ES PF (GLOVE) ×4 IMPLANT
GLOVE SURG SYN 6.5 PF PI (GLOVE) ×4 IMPLANT
GOWN STRL REUS W/ TWL LRG LVL3 (GOWN DISPOSABLE) ×6 IMPLANT
GOWN STRL REUS W/TWL LRG LVL3 (GOWN DISPOSABLE) ×6
IRRIGATOR SUCT 8 DISP DVNC XI (IRRIGATION / IRRIGATOR) IMPLANT
IV NS 1000ML (IV SOLUTION)
IV NS 1000ML BAXH (IV SOLUTION) IMPLANT
LABEL OR SOLS (LABEL) IMPLANT
MANIFOLD NEPTUNE II (INSTRUMENTS) ×2 IMPLANT
MESH 3DMAX MID 4X6 RT LRG (Mesh General) IMPLANT
NDL DRIVE SUT CUT DVNC (INSTRUMENTS) ×2 IMPLANT
NDL HYPO 22X1.5 SAFETY MO (MISCELLANEOUS) ×2 IMPLANT
NDL INSUFFLATION 14GA 120MM (NEEDLE) ×2 IMPLANT
NEEDLE DRIVE SUT CUT DVNC (INSTRUMENTS) ×2 IMPLANT
NEEDLE HYPO 22X1.5 SAFETY MO (MISCELLANEOUS) ×2 IMPLANT
NEEDLE INSUFFLATION 14GA 120MM (NEEDLE) ×2 IMPLANT
OBTURATOR OPTICAL STND 8 DVNC (TROCAR) ×2
OBTURATOR OPTICALSTD 8 DVNC (TROCAR) ×2 IMPLANT
PACK LAP CHOLECYSTECTOMY (MISCELLANEOUS) ×2 IMPLANT
PENCIL SMOKE EVACUATOR (MISCELLANEOUS) ×2 IMPLANT
SCISSORS MNPLR CVD DVNC XI (INSTRUMENTS) ×2 IMPLANT
SEAL UNIV 5-12 XI (MISCELLANEOUS) ×6 IMPLANT
SET TUBE SMOKE EVAC HIGH FLOW (TUBING) ×2 IMPLANT
SOL ELECTROSURG ANTI STICK (MISCELLANEOUS) ×2
SOLUTION ELECTROSURG ANTI STCK (MISCELLANEOUS) ×2 IMPLANT
SUT MNCRL 4-0 (SUTURE) ×4
SUT MNCRL 4-0 27XMFL (SUTURE) ×4
SUT VIC AB 2-0 SH 27 (SUTURE) ×2
SUT VIC AB 2-0 SH 27XBRD (SUTURE) ×2 IMPLANT
SUT VLOC 90 6 CV-15 VIOLET (SUTURE) ×4 IMPLANT
SUTURE MNCRL 4-0 27XMF (SUTURE) ×2 IMPLANT
SYR 30ML LL (SYRINGE) ×2 IMPLANT
TAPE TRANSPORE STRL 2 31045 (GAUZE/BANDAGES/DRESSINGS) ×2 IMPLANT
TRAP FLUID SMOKE EVACUATOR (MISCELLANEOUS) ×2 IMPLANT
TROCAR Z-THREAD FIOS 5X100MM (TROCAR) IMPLANT
WATER STERILE IRR 500ML POUR (IV SOLUTION) ×2 IMPLANT

## 2023-05-03 NOTE — Op Note (Addendum)
Preoperative diagnosis: right, initial, reducible, inguinal Hernia.  Postoperative diagnosis: right, initial Incarcerated inguinal Hernia  Procedure: Robotic assisted laparoscopic right inguinal hernia repair with mesh  Anesthesia: General  Surgeon: Dr. Tonna Boehringer  Wound Classification: Clean  Specimen: none  Complications: None  Estimated Blood Loss: 10mL   Indications:  inguinal hernia. Repair was indicated to avoid complications of incarceration, obstruction and pain, and a prosthetic mesh repair was elected.  See H&P for further details.  Findings: 1. Vas Deferens and cord structures identified and preserved 2. Bard 3D max medium weight mesh used for repair 3. Adequate hemostasis achieved  Description of procedure: The patient was taken to the operating room. A time-out was completed verifying correct patient, procedure, site, positioning, and implant(s) and/or special equipment prior to beginning this procedure.  Area was prepped and draped in the usual sterile fashion. An incision was marked 20 cm above the pubic tubercle, slightly above the umbilicus    Veress needle inserted at palmer's point.  Saline drop test noted to be positive with gradual increase in pressure after initiation of gas insufflation.  15 mm of pressure was achieved prior to removing the Veress needle and then 5mm port placed through same area due to previous abdominal surgeries.  Inspection of the area afterwards noted no injury to the surrounding organs.  Additional ports placed: a 8 mm port via the Optiview technique through the supraumbilical site.  2 port sites were marked 8 cm to the lateral sides of the initial port, and a 8 mm robotic port was placed on the left side, another 8 mm robotic port on the right side under direct supervision.  Local anesthesia  infused to the preplanned incision sites prior to insertion of the port.  The [employer] platform was then brought into the operative field and docked to  the ports.  Examination of the abdominal cavity noted a right inguinal hernia.  A peritoneal flap was created approximately 8cm cephalad to the defect by using scissors with electrocautery.  Dissection was carried down towards the pubic tubercle, developing the myopectineal orifice view.  Laterally the flap was carried towards the ASIS.  Small hernia sac was noted, incarcerated, tight through direct inguinal hernia. which carefully dissected away from the adjacent tissues to be fully reduced out of hernia cavity.  Any bleeding was controlled with combination of electrocautery and manual pressure.    After confirming adequate dissection and the peritoneal reflection completely down and away from the cord structures, small femoral hernia noted as well, but nothing in the defect. A Large Bard 3DMax medium weight mesh was placed within the anterior abdominal wall, secured in place using 2-0 Vicryl on an SH needle immediately above the pubic tubercle.  After noting proper placement of the mesh with the peritoneal reflection deep to it, the previously created peritoneal flap was secured back up to the anterior abdominal wall using running 3-0 V-Lock.  Both needles were then removed out of the abdominal cavity, Xi platform undocked from the ports and removed off of operative field.  exparel infused as ilioinguinal block.  Abdomen then desufflated and ports removed. All the skin incisions were then closed with a subcuticular stitch of Monocryl 4-0. Dermabond was applied.   The patient tolerated the procedure well and was taken to the postanesthesia care unit in stable condition. Sponge and instrument count correct at end of procedure.

## 2023-05-03 NOTE — Interval H&P Note (Signed)
History and Physical Interval Note:  05/03/2023 12:42 PM  Amanda Lambert  has presented today for surgery, with the diagnosis of non-recurrent unilateral inguinal hernia w/o obstruction or gangrene K40.90.  The various methods of treatment have been discussed with the patient and family. After consideration of risks, benefits and other options for treatment, the patient has consented to  Procedure(s): XI ROBOTIC ASSISTED INGUINAL HERNIA w/ mesh (N/A) as a surgical intervention.  The patient's history has been reviewed, patient examined, no change in status, stable for surgery.  I have reviewed the patient's chart and labs.  Questions were answered to the patient's satisfaction.      Tonna Boehringer

## 2023-05-03 NOTE — Transfer of Care (Signed)
Immediate Anesthesia Transfer of Care Note  Patient: Amanda Lambert  Procedure(s) Performed: XI ROBOTIC ASSISTED INGUINAL HERNIA w/ mesh (Inguinal) INSERTION OF MESH (Right: Inguinal)  Patient Location: PACU  Anesthesia Type:General  Level of Consciousness: drowsy  Airway & Oxygen Therapy: Patient Spontanous Breathing and Patient connected to face mask oxygen  Post-op Assessment: Report given to RN and Post -op Vital signs reviewed and stable  Post vital signs: stable  Last Vitals:  Vitals Value Taken Time  BP 151/77   Temp    Pulse 67 05/03/23 1446  Resp 21 05/03/23 1446  SpO2 100 % 05/03/23 1446  Vitals shown include unfiled device data.  Last Pain:  Vitals:   05/03/23 1238  TempSrc: Temporal  PainSc: 0-No pain         Complications: No notable events documented.

## 2023-05-03 NOTE — Anesthesia Procedure Notes (Signed)
Procedure Name: Intubation Date/Time: 05/03/2023 1:21 PM  Performed by: Emeterio Reeve, CRNAPre-anesthesia Checklist: Patient identified, Emergency Drugs available, Suction available and Patient being monitored Patient Re-evaluated:Patient Re-evaluated prior to induction Oxygen Delivery Method: Circle system utilized Preoxygenation: Pre-oxygenation with 100% oxygen Induction Type: IV induction Ventilation: Mask ventilation without difficulty Laryngoscope Size: McGraph and 3 Grade View: Grade I Tube type: Oral Tube size: 7.0 mm Number of attempts: 1 Airway Equipment and Method: Stylet and Oral airway Placement Confirmation: ETT inserted through vocal cords under direct vision, positive ETCO2 and breath sounds checked- equal and bilateral Secured at: 21 cm Tube secured with: Tape Dental Injury: Teeth and Oropharynx as per pre-operative assessment  Comments: Cords clear. Ca

## 2023-05-03 NOTE — Discharge Instructions (Addendum)
Hernia repair, Care After This sheet gives you information about how to care for yourself after your procedure. Your health care provider may also give you more specific instructions. If you have problems or questions, contact your health care provider. What can I expect after the procedure? After your procedure, it is common to have the following: Pain in your abdomen, especially in the incision areas. You will be given medicine to control the pain. Tiredness. This is a normal part of the recovery process. Your energy level will return to normal over the next several weeks. Changes in your bowel movements, such as constipation or needing to go more often. Talk with your health care provider about how to manage this. Follow these instructions at home: Medicines  tylenol and advil as needed for discomfort.  Please alternate between the two every four hours as needed for pain.    Use narcotics, if prescribed, only when tylenol and motrin is not enough to control pain.  325-650mg every 8hrs to max of 3000mg/24hrs (including the 325mg in every norco dose) for the tylenol.    Advil up to 800mg per dose every 8hrs as needed for pain.   PLEASE RECORD NUMBER OF PILLS TAKEN UNTIL NEXT FOLLOW UP APPT.  THIS WILL HELP DETERMINE HOW READY YOU ARE TO BE RELEASED FROM ANY ACTIVITY RESTRICTIONS Do not drive or use heavy machinery while taking prescription pain medicine. Do not drink alcohol while taking prescription pain medicine.  Incision care    Follow instructions from your health care provider about how to take care of your incision areas. Make sure you: Keep your incisions clean and dry. Wash your hands with soap and water before and after applying medicine to the areas, and before and after changing your bandage (dressing). If soap and water are not available, use hand sanitizer. Change your dressing as told by your health care provider. Leave stitches (sutures), skin glue, or adhesive strips in  place. These skin closures may need to stay in place for 2 weeks or longer. If adhesive strip edges start to loosen and curl up, you may trim the loose edges. Do not remove adhesive strips completely unless your health care provider tells you to do that. Do not wear tight clothing over the incisions. Tight clothing may rub and irritate the incision areas, which may cause the incisions to open. Do not take baths, swim, or use a hot tub until your health care provider approves. OK TO SHOWER IN 24HRS.   Check your incision area every day for signs of infection. Check for: More redness, swelling, or pain. More fluid or blood. Warmth. Pus or a bad smell. Activity Avoid lifting anything that is heavier than 10 lb (4.5 kg) for 2 weeks or until your health care provider says it is okay. No pushing/pulling greater than 30lbs You may resume normal activities as told by your health care provider. Ask your health care provider what activities are safe for you. Take rest breaks during the day as needed. Eating and drinking Follow instructions from your health care provider about what you can eat after surgery. To prevent or treat constipation while you are taking prescription pain medicine, your health care provider may recommend that you: Drink enough fluid to keep your urine clear or pale yellow. Take over-the-counter or prescription medicines. Eat foods that are high in fiber, such as fresh fruits and vegetables, whole grains, and beans. Limit foods that are high in fat and processed sugars, such as fried and   sweet foods. General instructions Ask your health care provider when you will need an appointment to get your sutures or staples removed. Keep all follow-up visits as told by your health care provider. This is important. Contact a health care provider if: You have more redness, swelling, or pain around your incisions. You have more fluid or blood coming from the incisions. Your incisions feel  warm to the touch. You have pus or a bad smell coming from your incisions or your dressing. You have a fever. You have an incision that breaks open (edges not staying together) after sutures or staples have been removed. You develop a rash. You have chest pain or difficulty breathing. You have pain or swelling in your legs. You feel light-headed or you faint. Your abdomen swells (becomes distended). You have nausea or vomiting. You have blood in your stool (feces). This information is not intended to replace advice given to you by your health care provider. Make sure you discuss any questions you have with your health care provider. Document Released: 03/31/2005 Document Revised: 05/31/2018 Document Reviewed: 06/12/2016 Elsevier Interactive Patient Education  2019 [employer]    AMBULATORY SURGERY  DISCHARGE INSTRUCTIONS   The drugs that you were given will stay in your system until tomorrow so for the next 24 hours you should not:  Drive an automobile Make any legal decisions Drink any alcoholic beverage   You may resume regular meals tomorrow.  Today it is better to start with liquids and gradually work up to solid foods.  You may eat anything you prefer, but it is better to start with liquids, then soup and crackers, and gradually work up to solid foods.   Please notify your doctor immediately if you have any unusual bleeding, trouble breathing, redness and pain at the surgery site, drainage, fever, or pain not relieved by medication.    Your post-operative visit with Dr.                                       is: Date:                        Time:    Please call to schedule your post-operative visit.  Additional Instructions:  

## 2023-05-03 NOTE — Anesthesia Preprocedure Evaluation (Signed)
Anesthesia Evaluation  Patient identified by MRN, date of birth, ID band Patient awake    Reviewed: Allergy & Precautions, NPO status , Patient's Chart, lab work & pertinent test results  History of Anesthesia Complications (+) PONV and history of anesthetic complications  Airway Mallampati: II  TM Distance: >3 FB Neck ROM: Full    Dental no notable dental hx. (+) Teeth Intact   Pulmonary neg pulmonary ROS, neg sleep apnea, neg COPD, Patient abstained from smoking.Not current smoker   Pulmonary exam normal breath sounds clear to auscultation       Cardiovascular Exercise Tolerance: Good METShypertension, + angina  + CAD and + Cardiac Stents  (-) Past MI (-) dysrhythmias  Rhythm:Regular Rate:Normal - Systolic murmurs    Neuro/Psych negative neurological ROS  negative psych ROS   GI/Hepatic ,GERD  Medicated and Controlled,,(+)     (-) substance abuse    Endo/Other  neg diabetes    Renal/GU CRFRenal diseasenegative Renal ROS     Musculoskeletal   Abdominal   Peds  Hematology   Anesthesia Other Findings Past Medical History: No date: Anemia No date: Angina pectoris (HCC) No date: Aortic atherosclerosis (HCC) No date: Arthritis 10/15/2019: Bilateral carotid artery disease (HCC)     Comment:  a.) carotid doppler 10/15/2019: 50-69% BICA; b.) carotid              doppler 04/06/2020: 50-69% RICA, 1-15% LICA; c.) carotid               doppler 11/03/2020 and 04/29/2021: 50-69% RICA, 16-49%               LICA; d.) carotid doppler 03/02/2022: 50-69% BICA; e.)               carotid doppler 03/14/2023: 50-69% RICA, 16-49% LICA No date: Biliary dyskinesia 06/14/2021: CAD (coronary artery disease)     Comment:  a.) LHC 11/04/2019: 50% mLAD - med mgmt; b.) LHC/PCI               06/14/2021: 40% p-mLAD, 80% mLAD (3.0 x 30 mm Onyx               [employer] DES; c.) LHC for FFR study: 07/19/2021: 40%               pLAD; RFR 0.92,  FFR 0.89, CFR 4.8, IMR 18 --> no               significant macro/microvascular disease No date: CKD (chronic kidney disease), stage III (HCC) No date: Complication of anesthesia     Comment:  a.) PONV; b.) delayed emergence No date: Constipation 10/15/2019: Diastolic dysfunction     Comment:  a.) TTE 10/15/2019: EF 50-55%, norm LAP, mild LA dil,               mod MR, mild-mod TR, G1DD; b.) TTE 05/19/2021: EF 50-55%,              mild basal sep hypertrophy, norm LAP, triv MR/TR, G1DD No date: GERD (gastroesophageal reflux disease) 1975: Hepatitis B No date: Hypercholesteremia No date: Hypertension No date: Inguinal hernia No date: Irritable bowel syndrome without diarrhea No date: LBBB (left bundle branch block) No date: Leukopenia No date: Long term current use of aspirin No date: PONV (postoperative nausea and vomiting) 04/01/2012: [place] spotted fever     Comment:  adm. to hospital 10/17/2019: Syncope No date: Thrombocytopenia (HCC) No date: Transaminitis No date: Trochanteric bursitis, right hip No date: Varicose  veins of leg with edema, bilateral No date: Weakness  Reproductive/Obstetrics                             Anesthesia Physical Anesthesia Plan  ASA: 3  Anesthesia Plan: General   Post-op Pain Management: Tylenol PO (pre-op)*   Induction: Intravenous  PONV Risk Score and Plan: 4 or greater and Ondansetron, Dexamethasone and Propofol infusion  Airway Management Planned: Oral ETT  Additional Equipment: None  Intra-op Plan:   Post-operative Plan: Extubation in OR  Informed Consent: I have reviewed the patients History and Physical, chart, labs and discussed the procedure including the risks, benefits and alternatives for the proposed anesthesia with the patient or authorized representative who has indicated his/her understanding and acceptance.     Dental advisory given  Plan Discussed with: CRNA and Surgeon  Anesthesia  Plan Comments: (Discussed risks of anesthesia with patient, including PONV, sore throat, lip/dental/eye damage. Rare risks discussed as well, such as cardiorespiratory and neurological sequelae, and allergic reactions. Discussed the role of CRNA in patient's perioperative care. Patient understands.)       Anesthesia Quick Evaluation

## 2023-05-03 NOTE — Anesthesia Postprocedure Evaluation (Signed)
Anesthesia Post Note  Patient: Amanda Lambert  Procedure(s) Performed: XI ROBOTIC ASSISTED INGUINAL HERNIA w/ mesh (Inguinal) INSERTION OF MESH (Right: Inguinal)  Patient location during evaluation: PACU Anesthesia Type: General Level of consciousness: awake and alert Pain management: pain level controlled Vital Signs Assessment: post-procedure vital signs reviewed and stable Respiratory status: spontaneous breathing, nonlabored ventilation, respiratory function stable and patient connected to nasal cannula oxygen Cardiovascular status: blood pressure returned to baseline and stable Postop Assessment: no apparent nausea or vomiting Anesthetic complications: no   No notable events documented.   Last Vitals:  Vitals:   05/03/23 1447 05/03/23 1500  BP: (!) 151/77 (!) 159/73  Pulse: 63 63  Resp:  14  Temp:    SpO2:  100%    Last Pain:  Vitals:   05/03/23 1500  TempSrc:   PainSc: Asleep                 Corinda Gubler

## 2023-05-04 ENCOUNTER — Encounter: Payer: Self-pay | Admitting: Surgery

## 2023-05-14 ENCOUNTER — Other Ambulatory Visit: Payer: Self-pay | Admitting: Cardiology

## 2023-05-14 DIAGNOSIS — I83893 Varicose veins of bilateral lower extremities with other complications: Secondary | ICD-10-CM

## 2023-05-21 ENCOUNTER — Encounter: Payer: BLUE CROSS/BLUE SHIELD | Admitting: Obstetrics and Gynecology

## 2023-06-10 ENCOUNTER — Other Ambulatory Visit: Payer: Self-pay | Admitting: Cardiology

## 2023-06-10 DIAGNOSIS — G250 Essential tremor: Secondary | ICD-10-CM

## 2023-06-29 ENCOUNTER — Other Ambulatory Visit: Payer: Self-pay | Admitting: Cardiology

## 2023-06-29 DIAGNOSIS — I209 Angina pectoris, unspecified: Secondary | ICD-10-CM

## 2023-06-29 DIAGNOSIS — I6523 Occlusion and stenosis of bilateral carotid arteries: Secondary | ICD-10-CM

## 2023-07-02 ENCOUNTER — Encounter: Payer: Self-pay | Admitting: Obstetrics and Gynecology

## 2023-07-02 ENCOUNTER — Ambulatory Visit: Payer: Medicare Other | Admitting: Obstetrics and Gynecology

## 2023-07-02 VITALS — BP 180/94 | HR 69 | Ht 67.0 in | Wt 175.0 lb

## 2023-07-02 DIAGNOSIS — N951 Menopausal and female climacteric states: Secondary | ICD-10-CM

## 2023-07-02 DIAGNOSIS — Z01419 Encounter for gynecological examination (general) (routine) without abnormal findings: Secondary | ICD-10-CM

## 2023-07-02 DIAGNOSIS — Z1231 Encounter for screening mammogram for malignant neoplasm of breast: Secondary | ICD-10-CM

## 2023-07-02 MED ORDER — ESTRADIOL 0.1 MG/GM VA CREA: 1.0000 g | TOPICAL_CREAM | VAGINAL | 3 refills | Status: DC

## 2023-07-02 NOTE — Progress Notes (Signed)
ANNUAL EXAM Patient name: Amanda Lambert MRN [id number]  Date of birth: [birth year] Chief Complaint:   New Patient (Initial Visit)  History of Present Illness:   WINNETTE GILLES is a 77 y.o. G60P0011 female being seen today for a routine annual exam.   Current concerns: None. Has h/o vaginal dryness and uses estradiol with relief. Some odor despite it. She had had pelvic pain/lower abdominal pain but resolved with hernia repair.   She is a former ICU RN, Garment/textile technologist - now retired.    No LMP recorded. Patient is postmenopausal.   Last MXR: None recent Last Pap/Pap History: Reports long history of normal except when 77 yo at which point she had cryo and normal thereafter.   Review of Systems:   Pertinent items are noted in HPI Denies any headaches, blurred vision, fatigue, shortness of breath, chest pain, abdominal pain, abnormal vaginal discharge/itching/odor/irritation, problems with periods, bowel movements, urination, or intercourse unless otherwise stated above.  Pertinent History Reviewed:  Reviewed past medical,surgical, social and family history.  Reviewed problem list, medications and allergies. Physical Assessment:   Vitals:   07/02/23 1457 07/02/23 1501  BP: (!) 178/95 (!) 180/94  Pulse: 63 69  Weight: 175 lb (79.4 kg)   Height: 5\' 7"  (1.702 m)   Body mass index is 27.41 kg/m.   Physical Examination:  General appearance - well appearing, and in no distress Mental status - alert, oriented to person, place, and time Psych:  She has a normal mood and affect Skin - warm and dry, normal color, no suspicious lesions noted Chest - effort normal Heart - normal rate  Breasts - breasts appear normal, no suspicious masses, no skin or nipple changes or axillary nodes Abdomen - soft, nontender, nondistended, no masses or organomegaly Pelvic -  VULVA: normal appearing vulva with no masses, tenderness or lesions  VAGINA: normal appearing vagina with normal color and discharge,  no lesions  CERVIX: normal appearing cervix without discharge or lesions, no CMT UTERUS: uterus is felt to be normal size, shape, consistency and nontender  ADNEXA: No adnexal masses or tenderness noted. Extremities:  No swelling or varicosities noted  Chaperone present for exam  No results found for this or any previous visit (from the past 24 hour(s)).  Assessment & Plan:  Eshita was seen today for new patient (initial visit).  Diagnoses and all orders for this visit:  Encounter for annual routine gynecological examination - Cervical cancer screening: Discussed guidelines related to screening beyond 65. Based on guidelines, the patient meets criteria for cessation of pap smears.  Patient would like: to stop pap smears unless indicated - Breast Health: Encouraged self-breast awareness/SBE. Discussed limits of clinical breast exam for detecting breast cancer. Discussed importance of annual MXR. Rx given for MXR - Colonoscopy: Per PCP - F/U 12 months and prn -     MM 3D SCREENING MAMMOGRAM BILATERAL BREAST; Future  Encounter for screening mammogram for malignant neoplasm of breast -     MM 3D SCREENING MAMMOGRAM BILATERAL BREAST; Future  Vaginal dryness, menopausal -     estradiol (ESTRACE) 0.1 MG/GM vaginal cream; Place 1 g vaginally 3 (three) times a week.      Orders Placed This Encounter  Procedures   MM 3D SCREENING MAMMOGRAM BILATERAL BREAST    Meds:  Meds ordered this encounter  Medications   estradiol (ESTRACE) 0.1 MG/GM vaginal cream    Sig: Place 1 g vaginally 3 (three) times a week.    Dispense:  42.5 g    Refill:  3    Follow-up: Return if symptoms worsen or fail to improve.  Milas Hock, MD 07/02/2023 3:19 PM

## 2023-07-02 NOTE — Progress Notes (Addendum)
77 y.o. New GYN presents for AEX.

## 2023-07-05 ENCOUNTER — Ambulatory Visit (HOSPITAL_BASED_OUTPATIENT_CLINIC_OR_DEPARTMENT_OTHER)
Admission: RE | Admit: 2023-07-05 | Discharge: 2023-07-05 | Disposition: A | Discharge status: Home / Self Care | Payer: Medicare Other | Source: Ambulatory Visit | Attending: Obstetrics and Gynecology | Admitting: Obstetrics and Gynecology

## 2023-07-05 DIAGNOSIS — Z01419 Encounter for gynecological examination (general) (routine) without abnormal findings: Secondary | ICD-10-CM | POA: Diagnosis not present

## 2023-07-05 DIAGNOSIS — Z1231 Encounter for screening mammogram for malignant neoplasm of breast: Secondary | ICD-10-CM | POA: Diagnosis not present

## 2023-07-10 ENCOUNTER — Other Ambulatory Visit: Payer: Self-pay | Admitting: Cardiology

## 2023-07-10 DIAGNOSIS — R600 Localized edema: Secondary | ICD-10-CM

## 2023-07-16 ENCOUNTER — Other Ambulatory Visit: Payer: Self-pay

## 2023-07-16 DIAGNOSIS — G250 Essential tremor: Secondary | ICD-10-CM

## 2023-07-16 MED ORDER — PROPRANOLOL HCL 20 MG PO TABS
20.0000 mg | ORAL_TABLET | Freq: Three times a day (TID) | ORAL | 1 refills | Status: DC
Start: 2023-07-16 — End: 2024-08-06

## 2023-07-17 DIAGNOSIS — M545 Low back pain, unspecified: Secondary | ICD-10-CM | POA: Diagnosis not present

## 2023-08-02 ENCOUNTER — Telehealth: Payer: Self-pay | Admitting: Cardiology

## 2023-08-02 NOTE — Telephone Encounter (Signed)
   Pt c/o of Chest Pain: STAT if active CP, including tightness, pressure, jaw pain, radiating pain to shoulder/upper arm/back, CP unrelieved by Nitro. Symptoms reported of SOB, nausea, vomiting, sweating.  1. Are you having CP right now? Tightness in her chest, but not at this exact time    2. Are you experiencing any other symptoms (ex. SOB, nausea, vomiting, sweating)? No- some nausea, but not at this time   3. Is your CP continuous or coming and going? Comes and go   4. Have you taken Nitroglycerin? Took 2 this morning, at different times    5. How long have you been experiencing CP?  Last night it woke up for about 4 hours- patient wanted to be seen -made an appointment for tomorrow(08-03-23) with Joni Reining   6. If NO CPw at time of call then end call with telling Pt to call back or call 911 if Chest pain returns prior to return call from triage team.

## 2023-08-02 NOTE — Progress Notes (Signed)
Cardiology Office Note:  .   Date:  08/03/2023  ID:  Amanda Lambert, DOB 04-25-1946, MRN [id number] PCP: Collene Mares, PA  [facility] Providers Cardiologist:  Dr. Jacinto Halim   History of Present Illness: .   Amanda Lambert is a 77 y.o. female, retired Scientist, research (physical sciences) of ED, we are following for annual visit with longstanding history of hypertension, CAD with stenting to the LAD (3.0 x 30 mm) She is here for annual follow-up.  2022, microvascular disease, chronic left bundle branch block, she is here for annual follow-up.  Since being seen last she has had abdominal hernia repair on 05/03/2023.  She has also had some episodes of hypokalemia while taking Bumex with need for potassium replacement.  This was discontinued by the patient.  She used this for chronic lower extremity edema.   She comes today with continued chest discomfort.  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  She states that it lasted about 4 hours.  She did take nitroglycerin sublingual x 2.  The second dose aiding in some relief.  This has become very concerning to her.   Otherwise she denies any rapid heart rhythm, dyspnea on exertion, chest pain with exertion, significant fatigue.  She has been medically compliant.  ROS: As above otherwise negative.  Studies Reviewed: [facility] Kitchen   LHC 06/15/2019  Prox LAD to Mid LAD lesion is 40% stenosed.   Mid LAD lesion is 80% stenosed.   A drug-eluting stent was successfully placed using a STENT ONYX FRONTIER 3.0X30.   Post intervention, there is a 0% residual stenosis.   LV end diastolic pressure is normal.   EKG Interpretation Date/Time:  Friday August 03 2023 09:19:06 EST Ventricular Rate:  60 PR Interval:  178 QRS Duration:  134 QT Interval:  436 QTC Calculation: 436 R Axis:   -62  Text Interpretation: Normal sinus rhythm Left axis deviation  Left bundle branch block When compared with ECG of 19-Jul-2021 14:40, Premature ventricular complexes are no longer Present PR interval has decreased T wave inversion no longer evident in Lateral leads Confirmed by Joni Reining [phone]) on 08/03/2023 9:37:22 AM    Physical Exam:   VS:  BP 124/84 (BP Location: Left Arm, Patient Position: Sitting, Cuff Size: Normal)   Pulse 60   Ht 5\' 7"  (1.702 m)   Wt 173 lb (78.5 kg)   SpO2 98%   BMI 27.10 kg/m    Wt Readings from Last 3 Encounters:  08/03/23 173 lb (78.5 kg)  07/02/23 175 lb (79.4 kg)  04/30/23 180 lb (81.6 kg)    GEN: Well nourished, well developed in no acute distress essential tremor noted. NECK: No JVD; No carotid bruits CARDIAC: RRR, no murmurs, rubs, gallops RESPIRATORY:  Clear to auscultation without rales, wheezing or rhonchi  ABDOMEN: Soft, non-tender, non-distended EXTREMITIES:  Mild non-pitting dependent edema; No deformity   ASSESSMENT AND PLAN: .    Chronic angina: Patient has a history of microvascular disease.  She has been on isosorbide dinitrate 20 mg 3 times daily.  We did change her to isosorbide mononitrate 120 mg daily due to recurrent angina for long-acting treatment.  Consider discontinuing propranolol and adding verapamil for Prinzmetal type angina.  Uncertain why she is not on long-acting beta-blocker for angina control as she is on propranolol 3 times a day.  She should follow-up with  Dr. Jacinto Halim for discussion of medications.  If symptoms are persistent may need to consider another ischemic evaluation.  2.  Chronic lower extremity edema: Likely related to nitrates and venous insufficiency.  She did not tolerate Bumex as she became hypokalemic.  She stopped this on her own.  She continues to have some dependent edema.  I have given her Rx for Lasix 20 mg as needed with potassium 20 mill equivalents for worsening lower extremity edema.  3.  Coronary artery disease: Stenting of the LAD (3.0 x 30 mm) in 2020.   Continue secondary management with blood pressure control, lipid management, purposeful exercise, and weight control with low-cholesterol diet.  4.  Hypercholesterolemia: Goal of LDL less than 70.  Remains on rosuvastatin 10 mg daily.  Fasting lipids and c-Met are ordered today.           Signed, Bettey Mare. Amanda Lambert, ANP, AACC

## 2023-08-02 NOTE — Telephone Encounter (Signed)
Spoke with patient, she reports intermittent chest tightness. Last episode was this morning and lasted about 4 hours. She took 2 NTG with minimal relief, did not want to take 3rd dose for fear of lowering BP.   She states Dr. 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 She states this chest tightness feels different than it has in the past.  She denies any SOB with these episodes. She states her arms will feel tight when this occurs, as if they are in a vice, squeezing.  Patient has appt scheduled with Joni Reining, NP tomorrow morning. Advised patient on ED precautions, patient verbalized understanding.

## 2023-08-03 ENCOUNTER — Encounter: Payer: Self-pay | Admitting: Adult Health

## 2023-08-03 ENCOUNTER — Ambulatory Visit: Payer: Medicare Other | Attending: Adult Health | Admitting: Adult Health

## 2023-08-03 VITALS — BP 124/84 | HR 60 | Ht 67.0 in | Wt 173.0 lb

## 2023-08-03 DIAGNOSIS — I25118 Atherosclerotic heart disease of native coronary artery with other forms of angina pectoris: Secondary | ICD-10-CM | POA: Diagnosis not present

## 2023-08-03 DIAGNOSIS — I209 Angina pectoris, unspecified: Secondary | ICD-10-CM | POA: Diagnosis not present

## 2023-08-03 DIAGNOSIS — R600 Localized edema: Secondary | ICD-10-CM | POA: Diagnosis not present

## 2023-08-03 DIAGNOSIS — E7800 Pure hypercholesterolemia, unspecified: Secondary | ICD-10-CM | POA: Diagnosis not present

## 2023-08-03 MED ORDER — ISOSORBIDE MONONITRATE ER 120 MG PO TB24: 120.0000 mg | ORAL_TABLET | Freq: Every day | ORAL | 2 refills | Status: DC

## 2023-08-03 MED ORDER — FUROSEMIDE 20 MG PO TABS: 20.0000 mg | ORAL_TABLET | ORAL | 2 refills | Status: DC | PRN

## 2023-08-03 MED ORDER — POTASSIUM CHLORIDE CRYS ER 20 MEQ PO TBCR: 20.0000 meq | EXTENDED_RELEASE_TABLET | Freq: Every day | ORAL | 2 refills | Status: DC

## 2023-08-03 MED ORDER — NITROGLYCERIN 0.4 MG SL SUBL: 0.4000 mg | SUBLINGUAL_TABLET | SUBLINGUAL | 3 refills | Status: DC | PRN

## 2023-08-03 NOTE — Patient Instructions (Signed)
Medication Instructions:  Stop Isosorbide Dinitrate. Start Imdur 120 mg ( take 1 Tablet Daily). Lasix 20 mg ( Take 1 Tablet As Needed ). Potassium 20 meq ( Take 1 Tablet As Needed with Lasix). *If you need a refill on your cardiac medications before your next appointment, please call your pharmacy*   Lab Work: CMET, Lipid Panel, Magnesium Level. If you have labs (blood work) drawn today and your tests are completely normal, you will receive your results only by: MyChart Message (if you have MyChart) OR A paper copy in the mail If you have any lab test that is abnormal or we need to change your treatment, we will call you to review the results.   Testing/Procedures: No Testing   Follow-Up: At [facility], you and your health needs are our priority.  As part of our continuing mission to provide you with exceptional heart care, we have created designated Provider Care Teams.  These Care Teams include your primary Cardiologist (physician) and Advanced Practice Providers (APPs -  Physician Assistants and Nurse Practitioners) who all work together to provide you with the care you need, when you need it.  We recommend signing up for the patient portal called "MyChart".  Sign up information is provided on this After Visit Summary.  MyChart is used to connect with patients for Virtual Visits (Telemedicine).  Patients are able to view lab/test results, encounter notes, upcoming appointments, etc.  Non-urgent messages can be sent to your provider as well.   To learn more about what you can do with MyChart, go to [email or web address].    Your next appointment:   6 month(s)  Provider:   Yates Decamp, MD

## 2023-08-04 LAB — COMPREHENSIVE METABOLIC PANEL
ALT: 9 [IU]/L (ref 0–32)
AST: 13 [IU]/L (ref 0–40)
Albumin: 4.3 g/dL (ref 3.8–4.8)
Alkaline Phosphatase: 107 [IU]/L (ref 44–121)
BUN/Creatinine Ratio: 16 (ref 12–28)
BUN: 15 mg/dL (ref 8–27)
Bilirubin Total: 0.4 mg/dL (ref 0.0–1.2)
CO2: 29 mmol/L (ref 20–29)
Calcium: 9.8 mg/dL (ref 8.7–10.3)
Chloride: 103 mmol/L (ref 96–106)
Creatinine, Ser: 0.93 mg/dL (ref 0.57–1.00)
Globulin, Total: 2.2 g/dL (ref 1.5–4.5)
Glucose: 101 mg/dL — ABNORMAL HIGH (ref 70–99)
Potassium: 3.9 mmol/L (ref 3.5–5.2)
Sodium: 142 mmol/L (ref 134–144)
Total Protein: 6.5 g/dL (ref 6.0–8.5)
eGFR: 64 mL/min/{1.73_m2} (ref >59)

## 2023-08-04 LAB — LIPID PANEL
Chol/HDL Ratio: 1.7 ratio (ref 0.0–4.4)
Cholesterol, Total: 147 mg/dL (ref 100–199)
HDL: 88 mg/dL (ref >39)
LDL Chol Calc (NIH): 44 mg/dL (ref 0–99)
Triglycerides: 78 mg/dL (ref 0–149)
VLDL Cholesterol Cal: 15 mg/dL (ref 5–40)

## 2023-08-04 LAB — MAGNESIUM: Magnesium: 1.8 mg/dL (ref 1.6–2.3)

## 2023-08-06 ENCOUNTER — Ambulatory Visit: Payer: Medicare Other

## 2023-08-06 ENCOUNTER — Telehealth: Payer: Self-pay

## 2023-08-06 ENCOUNTER — Ambulatory Visit
Admission: RE | Admit: 2023-08-06 | Discharge: 2023-08-06 | Disposition: A | Discharge status: Home / Self Care | Payer: Medicare Other | Source: Ambulatory Visit | Attending: Family Medicine | Admitting: Family Medicine

## 2023-08-06 VITALS — BP 189/102 | HR 65 | Temp 98.4°F | Resp 18

## 2023-08-06 DIAGNOSIS — M545 Low back pain, unspecified: Secondary | ICD-10-CM

## 2023-08-06 DIAGNOSIS — R109 Unspecified abdominal pain: Secondary | ICD-10-CM | POA: Diagnosis not present

## 2023-08-06 DIAGNOSIS — I700 Atherosclerosis of aorta: Secondary | ICD-10-CM | POA: Diagnosis not present

## 2023-08-06 LAB — URINALYSIS, W/ REFLEX TO CULTURE (INFECTION SUSPECTED)
Bilirubin Urine: NEGATIVE
Glucose, UA: NEGATIVE mg/dL
Ketones, ur: NEGATIVE mg/dL
Leukocytes,Ua: NEGATIVE
Nitrite: NEGATIVE
Protein, ur: NEGATIVE mg/dL
Specific Gravity, Urine: 1.015 (ref 1.005–1.030)
pH: 7 (ref 5.0–8.0)

## 2023-08-06 MED ORDER — BACLOFEN 10 MG PO TABS: 10.0000 mg | ORAL_TABLET | Freq: Three times a day (TID) | ORAL | 0 refills | Status: DC

## 2023-08-06 NOTE — Discharge Instructions (Addendum)
Your urinalysis did not show any signs of infection and your x-ray did not show any signs of a kidney stone.  I do suspect that your back pain is musculoskeletal in nature.  Continue taking the Celebrex and use the lidocaine patches as previously prescribed by your surgeon.  We are can add on a nonsedating muscle laxer called baclofen that you can take every 8 hours as needed.  You may also apply ice or moist heat to your low back for 20 minutes at a time, 2-3 times a day, as needed for pain.  If your symptoms continue, or they worsen, I recommend that you follow-up with orthopedics to have a more in-depth evaluation of your low back.

## 2023-08-06 NOTE — ED Triage Notes (Signed)
Patient presents to UC for lower back pain that radiates to RLQ pain x 3 weeks. States over the weekend it worsened, developed nausea, fever, chills. She saw her provider and they prescribed celebrex, tylenol, gabapentin, and lidocaine patch. Concerned with pyelonephritis.

## 2023-08-06 NOTE — Telephone Encounter (Addendum)
Called patient regarding results. Left message for patient to call office.----- Message from Joni Reining sent at 08/05/2023 12:55 PM EST ----- I have reviewed the labs.  All are WNL. No plans to change her medications at this time. She should continue to use lasix prn only.   KL

## 2023-08-06 NOTE — ED Provider Notes (Signed)
[facility]    CSN: [id number] Arrival date & time: 08/06/23  1319      History   Chief Complaint Chief Complaint  Patient presents with   Abdominal Pain   Back Pain    HPI Amanda Lambert is a 77 y.o. female.   HPI  77 year old female with a past medical history significant for CAD, CKD stage III, GERD, hypertension, right inguinal hernia with recent repair, CHF, and genitourinary syndrome of menopause presents for evaluation of low back pain on the right side with radiation into the right lower quadrant for the past 3 weeks.  In the last 3 days she has developed nausea, subjective fever, and chills.  She was evaluated by her surgeon due to her recent hernia repair in August 2024 who evaluated the surgery site I did not find any abnormalities.  He thought it might be coming from her back so he prescribed lidocaine patches and Celebrex.  The patient reports that she has not had any improvement of pain on the right side.  She does have a distant history of kidney stones.  She denies any blood in her urine but she has had urinary urgency but no frequency or dysuria above her baseline.  Past Medical History:  Diagnosis Date   Anemia    Angina pectoris (HCC)    Aortic atherosclerosis (HCC)    Arthritis    Bilateral carotid artery disease (HCC) 10/15/2019   a.) carotid doppler 10/15/2019: 50-69% BICA; b.) carotid doppler 04/06/2020: 50-69% RICA, 1-15% LICA; c.) carotid doppler 11/03/2020 and 04/29/2021: 50-69% RICA, 16-49% LICA; d.) carotid doppler 03/02/2022: 50-69% BICA; e.) carotid doppler 03/14/2023: 50-69% RICA, 16-49% LICA   Biliary dyskinesia    CAD (coronary artery disease) 06/14/2021   a.) LHC 11/04/2019: 50% mLAD - med mgmt; b.) LHC/PCI 06/14/2021: 40% p-mLAD, 80% mLAD (3.0 x 30 mm Onyx Frontier DES; c.) LHC for FFR study: 07/19/2021: 40% pLAD; RFR 0.92, FFR 0.89, CFR 4.8, IMR 18 --> no significant macro/microvascular disease   CKD (chronic kidney disease), stage  III (HCC)    Complication of anesthesia    a.) PONV; b.) delayed emergence   Constipation    Diastolic dysfunction 10/15/2019   a.) TTE 10/15/2019: EF 50-55%, norm LAP, mild LA dil, mod MR, mild-mod TR, G1DD; b.) TTE 05/19/2021: EF 50-55%, mild basal sep hypertrophy, norm LAP, triv MR/TR, G1DD   GERD (gastroesophageal reflux disease)    Hepatitis B 1975   Hypercholesteremia    Hypertension    Inguinal hernia    Irritable bowel syndrome without diarrhea    LBBB (left bundle branch block)    Leukopenia    Long term current use of aspirin    PONV (postoperative nausea and vomiting)    [facility] spotted fever 04/01/2012   adm. to hospital   Syncope 10/17/2019   Thrombocytopenia (HCC)    Transaminitis    Trochanteric bursitis, right hip    Vaginal Pap smear, abnormal    Varicose veins of leg with edema, bilateral    Weakness     Patient Active Problem List   Diagnosis Date Noted   CHF (congestive heart failure) (HCC) 04/17/2023   Coronary artery disease of native artery of native heart with stable angina pectoris (HCC) 07/04/2021   Hypercholesteremia 07/04/2021   LAD stenosis 06/13/2021   Angina pectoris (HCC) 11/03/2019   Biliary dyskinesia 04/09/2012   Transaminitis 04/01/2012   Lower urinary tract infectious disease 04/01/2012   HTN (hypertension) 04/01/2012   GERD (  gastroesophageal reflux disease) 04/01/2012    Past Surgical History:  Procedure Laterality Date   APPENDECTOMY  09/25/1961   CHOLECYSTECTOMY  04/26/2012   Procedure: LAPAROSCOPIC CHOLECYSTECTOMY WITH INTRAOPERATIVE CHOLANGIOGRAM;  Surgeon: Robyne Askew, MD;  Location: [facility];  Service: General;  Laterality: N/A;   COLONOSCOPY     CORONARY ANGIOGRAPHY N/A 07/19/2021   Procedure: CORONARY ANGIOGRAPHY (CATH LAB);  Surgeon: Yates Decamp, MD;  Location: [facility] INVASIVE CV LAB;  Service: Cardiovascular;  Laterality: N/A;   CORONARY IMAGING/OCT N/A 06/14/2021   Procedure: INTRAVASCULAR IMAGING/OCT;  Surgeon:  Yates Decamp, MD;  Location: [facility] INVASIVE CV LAB;  Service: Cardiovascular;  Laterality: N/A;   CORONARY PRESSURE/FFR STUDY N/A 07/19/2021   Procedure: INTRAVASCULAR PRESSURE WIRE/FFR STUDY;  Surgeon: Yates Decamp, MD;  Location: [facility] INVASIVE CV LAB;  Service: Cardiovascular;  Laterality: N/A;   CORONARY STENT INTERVENTION N/A 06/14/2021   Procedure: CORONARY STENT INTERVENTION;  Surgeon: Yates Decamp, MD;  Location: [facility] INVASIVE CV LAB;  Service: Cardiovascular;  Laterality: N/A;   DILATION AND CURETTAGE OF UTERUS  09/25/1980   for miscarriage   DILATION AND CURETTAGE, DIAGNOSTIC / THERAPEUTIC  09/25/1970   INSERTION OF MESH Right 05/03/2023   Procedure: INSERTION OF MESH;  Surgeon: Sung Amabile, DO;  Location: [facility] ORS;  Service: General;  Laterality: Right;   LEFT HEART CATH AND CORONARY ANGIOGRAPHY N/A 11/04/2019   Procedure: LEFT HEART CATH AND CORONARY ANGIOGRAPHY;  Surgeon: Yates Decamp, MD;  Location: [facility] INVASIVE CV LAB;  Service: Cardiovascular;  Laterality: N/A;   LEFT HEART CATH AND CORONARY ANGIOGRAPHY N/A 06/14/2021   Procedure: LEFT HEART CATH AND CORONARY ANGIOGRAPHY;  Surgeon: Yates Decamp, MD;  Location: [facility] INVASIVE CV LAB;  Service: Cardiovascular;  Laterality: N/A;   TONSILLECTOMY  1964  - approximate   WRIST FRACTURE SURGERY  09/25/2006   right    OB History     Gravida  2   Para      Term      Preterm      AB  1   Living  1      SAB  1   IAB      Ectopic      Multiple      Live Births  1            Home Medications    Prior to Admission medications   Medication Sig Start Date End Date Taking? Authorizing Provider  baclofen (LIORESAL) 10 MG tablet Take 1 tablet (10 mg total) by mouth 3 (three) times daily. 08/06/23  Yes Becky Augusta, NP  aspirin EC 81 MG tablet Take 1 tablet (81 mg total) by mouth daily. 10/13/19   Yates Decamp, MD  chlordiazePOXIDE-Clidinium (LIBRAX PO) Take by mouth as needed. Before meals bid as needed    [provider]   Cyanocobalamin (VITAMIN B-12 PO) Take 1 tablet by mouth in the morning.    [provider]  diphenhydrAMINE HCl (BENADRYL PO) Take by mouth as needed.    [provider]  EPINEPHrine 0.3 mg/0.3 mL IJ SOAJ injection Inject 0.3 mg into the muscle once as needed for anaphylaxis.    [provider]  esomeprazole (NEXIUM) 40 MG capsule Take 40 mg by mouth in the morning.    [provider]  estradiol (ESTRACE) 0.1 MG/GM vaginal cream Place 1 g vaginally 3 (three) times a week. 07/02/23   Milas Hock, MD  furosemide (LASIX) 20 MG tablet Take 1 tablet (20 mg total) by mouth  as needed. 08/03/23   Jodelle Gross, NP  gabapentin (NEURONTIN) 300 MG capsule Take 300 mg by mouth 2 (two) times daily. 02/14/21   [provider]  isosorbide mononitrate (IMDUR) 120 MG 24 hr tablet Take 1 tablet (120 mg total) by mouth daily. 08/03/23   Jodelle Gross, NP  magnesium 30 MG tablet Take 30 mg by mouth 2 (two) times daily.    [provider]  naproxen sodium (ALEVE) 220 MG tablet Take 220 mg by mouth daily as needed.    [provider]  nitroGLYCERIN (NITROSTAT) 0.4 MG SL tablet Place 1 tablet (0.4 mg total) under the tongue every 5 (five) minutes as needed for up to 25 days for chest pain. 08/03/23 08/28/23  Jodelle Gross, NP  ondansetron (ZOFRAN) 4 MG tablet Take by mouth. 04/04/23   [provider]  potassium chloride SA (KLOR-CON M20) 20 MEQ tablet Take 1 tablet (20 mEq total) by mouth daily. 08/03/23 11/01/23  Jodelle Gross, NP  Probiotic Product (ALIGN PO) Take by mouth.    [provider]  propranolol (INDERAL) 20 MG tablet Take 1 tablet (20 mg total) by mouth 3 (three) times daily. 07/16/23   Yates Decamp, MD  rosuvastatin (CRESTOR) 10 MG tablet TAKE 1 TABLET BY MOUTH EVERY DAY 06/29/23   Yates Decamp, MD  sodium chloride (OCEAN) 0.65 % SOLN nasal spray Place 1 spray into both nostrils as needed for congestion.    [provider]  VITAMIN D PO Take 1 capsule by mouth every other day. In the morning    [provider]    Family History Family History  Problem Relation Age of Onset   Cardiomyopathy Mother    Coronary artery disease Father    Hypertension Brother    Prostate cancer Brother     Social History Social History   Tobacco Use   Smoking status: Never   Smokeless tobacco: Never  Vaping Use   Vaping status: Never Used  Substance Use Topics   Alcohol use: No   Drug use: No     Allergies   Ace inhibitors, Bee venom, Thorazine [chlorpromazine], Wasp venom, Levaquin [levofloxacin in d5w], Shrimp [shellfish allergy], Amlodipine, Atropine sulfate, Elemental sulfur, Glycopyrrolate, Ramipril, Sulfa antibiotics, Compazine [prochlorperazine edisylate], and Latex   Review of Systems Review of Systems  Constitutional:  Positive for chills and fever.  Gastrointestinal:  Positive for abdominal pain and nausea.  Genitourinary:  Positive for flank pain and urgency. Negative for dysuria, frequency and hematuria.     Physical Exam Triage Vital Signs ED Triage Vitals [08/06/23 1414]  Encounter Vitals Group     BP      Systolic BP Percentile      Diastolic BP Percentile      Pulse      Resp      Temp      Temp src      SpO2      Weight      Height      Head Circumference      Peak Flow      Pain Score 6     Pain Loc      Pain Education      Exclude from Growth Chart    No data found.  Updated Vital Signs BP (!) 189/102 (BP Location: Left Arm)   Pulse 65   Temp 98.4 F (36.9 C) (Oral)   Resp 18   SpO2 96%   Visual Acuity  Right Eye Distance:   Left Eye Distance:   Bilateral Distance:    Right Eye Near:   Left Eye Near:    Bilateral Near:     Physical Exam Vitals and nursing note reviewed.  Constitutional:      Appearance: Normal appearance. She is not ill-appearing.  HENT:     Head: Normocephalic and atraumatic.  Cardiovascular:     Rate and Rhythm:  Normal rate and regular rhythm.     Pulses: Normal pulses.     Heart sounds: Normal heart sounds. No murmur heard.    No friction rub. No gallop.  Pulmonary:     Effort: Pulmonary effort is normal.     Breath sounds: Normal breath sounds. No wheezing, rhonchi or rales.  Abdominal:     General: Abdomen is flat.     Tenderness: There is abdominal tenderness. There is no right CVA tenderness, left CVA tenderness, guarding or rebound.     Comments: Patient has mild tenderness in her right lower quadrant though she is 3 months status post right inguinal hernia repair.  Musculoskeletal:        General: Tenderness present.     Comments: Tenderness with palpation of the right lower lumbar paraspinous soft tissue.  No midline spinous process tenderness or step-off in the thoracic spine.  Neurological:     Mental Status: She is alert.      UC Treatments / Results  Labs (all labs ordered are listed, but only abnormal results are displayed) Labs Reviewed  URINALYSIS, W/ REFLEX TO CULTURE (INFECTION SUSPECTED) - Abnormal; Notable for the following components:      Result Value   Hgb urine dipstick TRACE (*)    Bacteria, UA RARE (*)    All other components within normal limits    EKG   Radiology DG Abdomen 1 View  Result Date: 08/06/2023 CLINICAL DATA:  Right flank pain with radiation for 3 weeks. EXAM: ABDOMEN - 1 VIEW COMPARISON:  CT 04/03/2023 with contrast. FINDINGS: Gas seen in nondilated loops small and large bowel. Scattered colonic stool. Vascular calcifications seen along the aorta and along vessels in the pelvis. Previous cholecystectomy. No obvious free air on these supine radiographs. No definite abnormal calcification seen overlying either kidney nor along the expected course of either ureter. The calcifications in the pelvis has a appearance likely vascular calcifications and corresponding to the finding by prior CT scan. There is significant gas and stool overlying the reniform  shadows limit evaluation for subtle calcification. If there is further concern renal or ureteral stones and noncontrast CT could be considered as clinically appropriate IMPRESSION: Nonspecific bowel gas pattern with scattered stool. Diffuse vascular calcifications. No definite abnormal calcification overlying either kidney nor along the expected course of either ureter. Additional workup as clinically appropriate Electronically Signed   By: Karen Kays M.D.   On: 08/06/2023 16:03    Procedures Procedures (including critical care time)  Medications Ordered in UC Medications - No data to display  Initial Impression / Assessment and Plan / UC Course  I have reviewed the triage vital signs and the nursing notes.  Pertinent labs & imaging results that were available during my care of the patient were reviewed by me and considered in my medical decision making (see chart for details).   Patient is a very pleasant, nontoxic-appearing 4 old female presenting for evaluation of back pain with rotation to the right lower quadrant as outlined HPI above.  Patient reports that she is concerned  about possible pyelonephritis given that she has a history of recurrent UTIs and genitourinary syndrome of menopause.  She had been prescribed vaginal estrogen but she stopped using it as it was causing irritation in her perineal area.  She does endorse urinary urgency but no frequency or dysuria above baseline and no hematuria.  She does have a distant history of renal stones.  She did recently have repair of an inguinal hernia in her right lower quadrant in August of this year and she has a distant history in the 1960s of appendectomy.  Her physical exam does reveal soft tissue tenderness but no overt spasm in the right lower lumbar paraspinous region.  This does not radiate into her buttock or hip.  She also has mild tenderness in the right lower quadrant but no guarding or rebound.  Patient developed subjective fever with  nausea and chills over the weekend and she is concerned about possible pyelonephritis.  Urinalysis was collected at triage.  Given patient's 3-week history of pain and distant history of kidney stones I am concerned for possible obstructing stone so I will order a KUB as we do not have the ability to perform the CT at this time.  Urinalysis shows trace hemoglobin but is negative for leukocyte esterase, nitrates, or protein.  Reflex microscopy shows skin contamination with 11-20 squamous epithelials and rare bacteria.  KUB independently reviewed and evaluated by me.  Impression: Patient has a lucency on the right lower quadrant of the pelvis that measures 4.85 mm which may represent a distal obstructing stone.  Patient also has multiple phleboliths present in her pelvic vault.  Radiology overread is pending. Radiology impression states nonspecific bowel gas pattern with scattered stool and diffuse vascular calcifications.  No definite abnormal calcification overlying either kidney or along the expected course of either ureter.  Patient's blood pressure was 189/1 [facility] staff to recheck the blood pressure.  If her BP remains elevated then we will check renal labs otherwise we will discharge her home with diagnosis of musculoskeletal low back pain and I will add baclofen to her mix.  Repeat blood pressure is 158/85.     Final Clinical Impressions(s) / UC Diagnoses   Final diagnoses:  Acute right-sided low back pain without sciatica     Discharge Instructions      Your urinalysis did not show any signs of infection and your x-ray did not show any signs of a kidney stone.  I do suspect that your back pain is musculoskeletal in nature.  Continue taking the Celebrex and use the lidocaine patches as previously prescribed by your surgeon.  We are can add on a nonsedating muscle laxer called baclofen that you can take every 8 hours as needed.  You may also apply ice or moist heat to your low back  for 20 minutes at a time, 2-3 times a day, as needed for pain.  If your symptoms continue, or they worsen, I recommend that you follow-up with orthopedics to have a more in-depth evaluation of your low back.     ED Prescriptions     Medication Sig Dispense Auth. Provider   baclofen (LIORESAL) 10 MG tablet Take 1 tablet (10 mg total) by mouth 3 (three) times daily. 30 each Becky Augusta, NP      PDMP not reviewed this encounter.   Becky Augusta, NP 08/06/23 [phone]

## 2023-08-07 ENCOUNTER — Telehealth: Payer: Self-pay

## 2023-08-07 NOTE — Telephone Encounter (Addendum)
Results viewed by patient via MyChart.----- Message from Joni Reining sent at 08/05/2023 12:55 PM EST ----- I have reviewed the labs.  All are WNL. No plans to change her medications at this time. She should continue to use lasix prn only.   KL

## 2023-08-08 ENCOUNTER — Emergency Department
Admission: EM | Admit: 2023-08-08 | Discharge: 2023-08-08 | Disposition: A | Discharge status: Home / Self Care | Payer: Medicare Other | Attending: Emergency Medicine | Admitting: Emergency Medicine

## 2023-08-08 ENCOUNTER — Other Ambulatory Visit: Payer: Self-pay

## 2023-08-08 ENCOUNTER — Emergency Department: Payer: Medicare Other

## 2023-08-08 ENCOUNTER — Encounter: Payer: Self-pay | Admitting: Emergency Medicine

## 2023-08-08 DIAGNOSIS — K5730 Diverticulosis of large intestine without perforation or abscess without bleeding: Secondary | ICD-10-CM | POA: Diagnosis not present

## 2023-08-08 DIAGNOSIS — R109 Unspecified abdominal pain: Secondary | ICD-10-CM | POA: Diagnosis not present

## 2023-08-08 DIAGNOSIS — R1031 Right lower quadrant pain: Secondary | ICD-10-CM

## 2023-08-08 DIAGNOSIS — X58XXXA Exposure to other specified factors, initial encounter: Secondary | ICD-10-CM | POA: Diagnosis not present

## 2023-08-08 DIAGNOSIS — S32020A Wedge compression fracture of second lumbar vertebra, initial encounter for closed fracture: Secondary | ICD-10-CM | POA: Diagnosis not present

## 2023-08-08 DIAGNOSIS — K439 Ventral hernia without obstruction or gangrene: Secondary | ICD-10-CM | POA: Diagnosis not present

## 2023-08-08 DIAGNOSIS — M545 Low back pain, unspecified: Secondary | ICD-10-CM | POA: Diagnosis not present

## 2023-08-08 LAB — COMPREHENSIVE METABOLIC PANEL
ALT: 9 U/L (ref 0–44)
AST: 16 U/L (ref 15–41)
Albumin: 3.7 g/dL (ref 3.5–5.0)
Alkaline Phosphatase: 77 U/L (ref 38–126)
Anion gap: 11 (ref 5–15)
BUN: 13 mg/dL (ref 8–23)
CO2: 24 mmol/L (ref 22–32)
Calcium: 9.3 mg/dL (ref 8.9–10.3)
Chloride: 104 mmol/L (ref 98–111)
Creatinine, Ser: 0.99 mg/dL (ref 0.44–1.00)
GFR, Estimated: 59 mL/min — ABNORMAL LOW (ref >60)
Glucose, Bld: 103 mg/dL — ABNORMAL HIGH (ref 70–99)
Potassium: 3.9 mmol/L (ref 3.5–5.1)
Sodium: 139 mmol/L (ref 135–145)
Total Bilirubin: 0.6 mg/dL (ref <1.2)
Total Protein: 6.9 g/dL (ref 6.5–8.1)

## 2023-08-08 LAB — CBC WITH DIFFERENTIAL/PLATELET
Abs Immature Granulocytes: 0.02 10*3/uL (ref 0.00–0.07)
Basophils Absolute: 0.1 10*3/uL (ref 0.0–0.1)
Basophils Relative: 1 %
Eosinophils Absolute: 0.2 10*3/uL (ref 0.0–0.5)
Eosinophils Relative: 4 %
HCT: 37.8 % (ref 36.0–46.0)
Hemoglobin: 12.3 g/dL (ref 12.0–15.0)
Immature Granulocytes: 0 %
Lymphocytes Relative: 22 %
Lymphs Abs: 1.4 10*3/uL (ref 0.7–4.0)
MCH: 29.2 pg (ref 26.0–34.0)
MCHC: 32.5 g/dL (ref 30.0–36.0)
MCV: 89.8 fL (ref 80.0–100.0)
Monocytes Absolute: 0.4 10*3/uL (ref 0.1–1.0)
Monocytes Relative: 6 %
Neutro Abs: 4.3 10*3/uL (ref 1.7–7.7)
Neutrophils Relative %: 67 %
Platelets: 271 10*3/uL (ref 150–400)
RBC: 4.21 MIL/uL (ref 3.87–5.11)
RDW: 11.9 % (ref 11.5–15.5)
WBC: 6.5 10*3/uL (ref 4.0–10.5)
nRBC: 0 % (ref 0.0–0.2)

## 2023-08-08 LAB — URINALYSIS, ROUTINE W REFLEX MICROSCOPIC
Bilirubin Urine: NEGATIVE
Glucose, UA: NEGATIVE mg/dL
Hgb urine dipstick: NEGATIVE
Ketones, ur: NEGATIVE mg/dL
Leukocytes,Ua: NEGATIVE
Nitrite: NEGATIVE
Protein, ur: NEGATIVE mg/dL
Specific Gravity, Urine: 1.005 (ref 1.005–1.030)
pH: 7 (ref 5.0–8.0)

## 2023-08-08 LAB — LIPASE, BLOOD: Lipase: 20 U/L (ref 11–51)

## 2023-08-08 LAB — LACTIC ACID, PLASMA: Lactic Acid, Venous: 1 mmol/L (ref 0.5–1.9)

## 2023-08-08 MED ORDER — HYDROCODONE-ACETAMINOPHEN 5-325 MG PO TABS: 1.0000 | ORAL_TABLET | ORAL | 0 refills | Status: DC | PRN

## 2023-08-08 MED ORDER — MORPHINE SULFATE (PF) 4 MG/ML IV SOLN
4.0000 mg | Freq: Once | INTRAVENOUS | Status: AC
  Administered 2023-08-08: 4 mg via INTRAVENOUS
  Filled 2023-08-08: qty 1

## 2023-08-08 MED ORDER — IOHEXOL 300 MG/ML  SOLN
100.0000 mL | Freq: Once | INTRAMUSCULAR | Status: AC | PRN
  Administered 2023-08-08: 100 mL via INTRAVENOUS

## 2023-08-08 MED ORDER — ONDANSETRON HCL 4 MG/2ML IJ SOLN
4.0000 mg | Freq: Four times a day (QID) | INTRAMUSCULAR | Status: DC | PRN
  Administered 2023-08-08: 4 mg via INTRAVENOUS
  Filled 2023-08-08: qty 2

## 2023-08-08 MED ORDER — KETOROLAC TROMETHAMINE 30 MG/ML IJ SOLN
15.0000 mg | Freq: Once | INTRAMUSCULAR | Status: DC
  Filled 2023-08-08: qty 1

## 2023-08-08 MED ORDER — MELOXICAM 15 MG PO TABS: 15.0000 mg | ORAL_TABLET | Freq: Every day | ORAL | 0 refills | Status: AC

## 2023-08-08 MED ORDER — CYCLOBENZAPRINE HCL 10 MG PO TABS
10.0000 mg | ORAL_TABLET | Freq: Once | ORAL | Status: AC
  Administered 2023-08-08: 10 mg via ORAL
  Filled 2023-08-08: qty 1

## 2023-08-08 NOTE — ED Notes (Signed)
Pt back from CT. Oxygen sats noted to be 86%. Rn at bedside once talking with pt oxygen increased to 96%. EDP made aware.

## 2023-08-08 NOTE — ED Provider Notes (Signed)
Care assumed of patient from outgoing provider.  See their note for initial history, exam and plan.  Clinical Course as of 08/08/23 1706  Wed Aug 08, 2023  1505 L2 compression fracture - Dr. Jovita Kussmaul aware and plan for tlso and dc [SM]    Clinical Course User Index [SM] Corena Herter, MD     Corena Herter, MD 08/08/23 [phone]

## 2023-08-08 NOTE — ED Triage Notes (Signed)
Pt via c/o RLQ abd pain and R flank pain for the past couple weeks report it has been getting worse intermittently. States she also has been nauseous. Denies any fevers or diarrhea. Pt had a hernia repair here couple months ago and felt fine after that but states that she then had a "massage" couple weeks ago and the pain in her back has been increasing since then. Pt is A&Ox4 and NAD

## 2023-08-08 NOTE — ED Provider Notes (Signed)
[facility] Provider Note   Event Date/Time   First MD Initiated Contact with Patient 08/08/23 0820     (approximate) History  Abdominal Pain and Back Pain  HPI Amanda Lambert is a 77 y.o. female with recent hernia repair and genitourinary syndrome of menopause who presents complaining of right flank and right lower quadrant abdominal pain that has been present over the last week, worsened over the last 24 hours, and recently seen at urgent care and diagnosed with musculoskeletal injury.  Patient was placed on baclofen in addition to lidocaine patches for pain control and states that there has been no improvement.  Patient endorses mild dysuria.  Patient endorses subjective fevers and chills over the last 24 hours.  Patient states she has a history of recurring urinary tract infections and is concerned for possible pyelonephritis.  Patient also endorses history of kidney stones ROS: Patient currently denies any vision changes, tinnitus, difficulty speaking, facial droop, sore throat, chest pain, shortness of breath,  nausea/vomiting/diarrhea, or weakness/numbness/paresthesias in any extremity   Physical Exam  Triage Vital Signs: ED Triage Vitals  Encounter Vitals Group     BP 08/08/23 0814 (!) 175/94     Systolic BP Percentile --      Diastolic BP Percentile --      Pulse Rate 08/08/23 0814 77     Resp 08/08/23 0814 20     Temp 08/08/23 0814 97.9 F (36.6 C)     Temp Source 08/08/23 0814 Oral     SpO2 08/08/23 0814 100 %     Weight 08/08/23 0816 173 lb (78.5 kg)     Height 08/08/23 0816 5\' 7"  (1.702 m)     Head Circumference --      Peak Flow --      Pain Score 08/08/23 0815 8     Pain Loc --      Pain Education --      Exclude from Growth Chart --    Most recent vital signs: Vitals:   08/08/23 1430 08/08/23 1630  BP: (!) 177/86 (!) 188/71  Pulse: 74 68  Resp: 20 18  Temp:    SpO2: 98% 95%   General: Awake, oriented x4. CV:  Good peripheral  perfusion.  Resp:  Normal effort.  Abd:  No distention.  Right lower quadrant tenderness to palpation Other:  Elderly overweight Caucasian female resting uncomfortably in mild distress secondary to pain ED Results / Procedures / Treatments  Labs (all labs ordered are listed, but only abnormal results are displayed) Labs Reviewed  COMPREHENSIVE METABOLIC PANEL - Abnormal; Notable for the following components:      Result Value   Glucose, Bld 103 (*)    GFR, Estimated 59 (*)    All other components within normal limits  URINALYSIS, ROUTINE W REFLEX MICROSCOPIC - Abnormal; Notable for the following components:   Color, Urine STRAW (*)    APPearance CLEAR (*)    All other components within normal limits  CBC WITH DIFFERENTIAL/PLATELET  LACTIC ACID, PLASMA  LIPASE, BLOOD   RADIOLOGY ED MD interpretation: CT of the abdomen and pelvis with IV contrast shows a lumbar compression fracture of L2 with approximately 40% height loss -Agree with radiology assessment Official radiology report(s): No results found. PROCEDURES: Critical Care performed: No .1-3 Lead EKG Interpretation  Performed by: Merwyn Katos, MD Authorized by: Merwyn Katos, MD     Interpretation: normal     ECG rate:  71  ECG rate assessment: normal     Rhythm: sinus rhythm     Ectopy: none     Conduction: normal    MEDICATIONS ORDERED IN ED: Medications  morphine (PF) 4 MG/ML injection 4 mg (4 mg Intravenous Given 08/08/23 0840)  morphine (PF) 4 MG/ML injection 4 mg (4 mg Intravenous Given 08/08/23 0912)  iohexol (OMNIPAQUE) 300 MG/ML solution 100 mL (100 mLs Intravenous Contrast Given 08/08/23 1027)  cyclobenzaprine (FLEXERIL) tablet 10 mg (10 mg Oral Given 08/08/23 1457)   IMPRESSION / MDM / ASSESSMENT AND PLAN / ED COURSE  I reviewed the triage vital signs and the nursing notes.                             The patient is on the cardiac monitor to evaluate for evidence of arrhythmia and/or significant  heart rate changes. Patient's presentation is most consistent with acute presentation with potential threat to life or bodily function. The Pt was found to have a closed L2 fracture on XR. The Pt is otherwise well appearing, hemodynamically stable, and shows no evidence of neurovascular injury or compartment syndrome.  I have low suspicion for dislocation, significant ligamentous injury, septic arthritis, new autoimmune arthropathy, or gonococcal arthropathy.  In discussion with Dr. Marcell Barlow of neurosurgery, recommended patient placed in LSO brace and will follow up with ortho.    Rx: norco  Dispo: Discharge home with orthopedic follow-up Clinical Course as of 08/09/23 1626  Wed Aug 08, 2023  1505 L2 compression fracture - Dr. Jovita Kussmaul aware and plan for tlso and dc [SM]    Clinical Course User Index [SM] Corena Herter, MD   FINAL CLINICAL IMPRESSION(S) / ED DIAGNOSES   Final diagnoses:  Compression fracture of L2 vertebra, initial encounter (HCC)  Pain in right lumbar region of back  Right lower quadrant abdominal pain   Rx / DC Orders   ED Discharge Orders          Ordered    HYDROcodone-acetaminophen (NORCO) 5-325 MG tablet  Every 4 hours PRN        08/08/23 1446    meloxicam (MOBIC) 15 MG tablet  Daily        08/08/23 1446           Note:  This document was prepared using Dragon voice recognition software and may include unintentional dictation errors.   Merwyn Katos, MD 08/09/23 [phone]

## 2023-08-08 NOTE — Progress Notes (Signed)
Orthopedic Tech Progress Note Patient Details:  Amanda Lambert 1946/07/07 [id number] LSO brace has been ordered from [facility]  Patient ID: Amanda Lambert, female   DOB: 1945-10-14, 77 y.o.   MRN: [id number]  [name] 08/08/2023, 2:50 PM

## 2023-08-08 NOTE — ED Provider Notes (Deleted)
Care assumed of patient from outgoing provider.  See their note for initial history, exam and plan.  Clinical Course as of 08/08/23 1552  Wed Aug 08, 2023  1505 L2 compression fracture - Dr. Jovita Kussmaul aware and plan for tlso and dc [SM]    Clinical Course User Index [SM] Corena Herter, MD     Corena Herter, MD 08/08/23 [phone]

## 2023-08-08 NOTE — ED Notes (Signed)
Ortho tech called at [phone] for ordering and placement of TLSO brace

## 2023-08-08 NOTE — ED Notes (Signed)
Pt transported to CT ?

## 2023-08-08 NOTE — ED Notes (Signed)
Pt assisted to the bathroom with standby assist

## 2023-08-08 NOTE — ED Provider Notes (Signed)
Care assumed of patient from outgoing provider.  See their note for initial history, exam and plan.  Clinical Course as of 08/08/23 1613  Wed Aug 08, 2023  1505 L2 compression fracture - Dr. Jovita Kussmaul aware and plan for tlso and dc [SM]    Clinical Course User Index [SM] Corena Herter, MD     Corena Herter, MD 08/08/23 [phone]

## 2023-08-08 NOTE — ED Notes (Addendum)
Pt assisted to the bathroom with standby assist

## 2023-08-16 NOTE — Progress Notes (Signed)
Referring Physician:  Collene Mares, PA [address] [place],  [place] [address]  Primary Physician:  Hyacinth Meeker, [place], [place]  History of Present Illness: 08/21/2023 [name] is here today with a chief complaint of pain which precipitated workup showing an L2 compression fracture.  She has no new neurologic symptoms.  She was given an LSO brace and has been compliant with her brace.  It does help her.  Her pain is improved significantly over the past couple of weeks.  She is however still having pain.   Conservative measures:  Physical therapy: has not participated in  Multimodal medical therapy including regular antiinflammatories:  baclofen, lidocaine patches, hydrocodone,  Injections: has not received epidural steroid injections  Past Surgery: no prior spinal surgeries  Amanda Lambert has no symptoms of cervical myelopathy.  The symptoms are causing a significant impact on the patient's life.   I have utilized the care everywhere function in epic to review the outside records available from external health systems.  Review of Systems:  A 10 point review of systems is negative, except for the pertinent positives and negatives detailed in the HPI.  Past Medical History: Past Medical History:  Diagnosis Date   Anemia    Angina pectoris (HCC)    Aortic atherosclerosis (HCC)    Arthritis    Bilateral carotid artery disease (HCC) 10/15/2019   a.) carotid doppler 10/15/2019: 50-69% BICA; b.) carotid doppler 04/06/2020: 50-69% RICA, 1-15% LICA; c.) carotid doppler 11/03/2020 and 04/29/2021: 50-69% RICA, 16-49% LICA; d.) carotid doppler 03/02/2022: 50-69% BICA; e.) carotid doppler 03/14/2023: 50-69% RICA, 16-49% LICA   Biliary dyskinesia    CAD (coronary artery disease) 06/14/2021   a.) LHC 11/04/2019: 50% mLAD - med mgmt; b.) LHC/PCI 06/14/2021: 40% p-mLAD, 80% mLAD (3.0 x 30 mm Onyx Frontier DES; c.) LHC for FFR study: 07/19/2021: 40% pLAD; RFR 0.92,  FFR 0.89, CFR 4.8, IMR 18 --> no significant macro/microvascular disease   CKD (chronic kidney disease), stage III (HCC)    Complication of anesthesia    a.) PONV; b.) delayed emergence   Constipation    Diastolic dysfunction 10/15/2019   a.) TTE 10/15/2019: EF 50-55%, norm LAP, mild LA dil, mod MR, mild-mod TR, G1DD; b.) TTE 05/19/2021: EF 50-55%, mild basal sep hypertrophy, norm LAP, triv MR/TR, G1DD   GERD (gastroesophageal reflux disease)    Hepatitis B 1975   Hypercholesteremia    Hypertension    Inguinal hernia    Irritable bowel syndrome without diarrhea    LBBB (left bundle branch block)    Leukopenia    Long term current use of aspirin    PONV (postoperative nausea and vomiting)    [facility] spotted fever 04/01/2012   adm. to hospital   Syncope 10/17/2019   Thrombocytopenia (HCC)    Transaminitis    Trochanteric bursitis, right hip    Vaginal Pap smear, abnormal    Varicose veins of leg with edema, bilateral    Weakness     Past Surgical History: Past Surgical History:  Procedure Laterality Date   APPENDECTOMY  09/25/1961   CHOLECYSTECTOMY  04/26/2012   Procedure: LAPAROSCOPIC CHOLECYSTECTOMY WITH INTRAOPERATIVE CHOLANGIOGRAM;  Surgeon: Robyne Askew, MD;  Location: [facility] ORS;  Service: General;  Laterality: N/A;   COLONOSCOPY     CORONARY ANGIOGRAPHY N/A 07/19/2021   Procedure: CORONARY ANGIOGRAPHY (CATH LAB);  Surgeon: Yates Decamp, MD;  Location: [facility] INVASIVE CV LAB;  Service: Cardiovascular;  Laterality: N/A;  CORONARY IMAGING/OCT N/A 06/14/2021   Procedure: INTRAVASCULAR IMAGING/OCT;  Surgeon: Yates Decamp, MD;  Location: [facility] INVASIVE CV LAB;  Service: Cardiovascular;  Laterality: N/A;   CORONARY PRESSURE/FFR STUDY N/A 07/19/2021   Procedure: INTRAVASCULAR PRESSURE WIRE/FFR STUDY;  Surgeon: Yates Decamp, MD;  Location: [facility] INVASIVE CV LAB;  Service: Cardiovascular;  Laterality: N/A;   CORONARY STENT INTERVENTION N/A 06/14/2021   Procedure: CORONARY STENT  INTERVENTION;  Surgeon: Yates Decamp, MD;  Location: [facility] INVASIVE CV LAB;  Service: Cardiovascular;  Laterality: N/A;   DILATION AND CURETTAGE OF UTERUS  09/25/1980   for miscarriage   DILATION AND CURETTAGE, DIAGNOSTIC / THERAPEUTIC  09/25/1970   INSERTION OF MESH Right 05/03/2023   Procedure: INSERTION OF MESH;  Surgeon: Sung Amabile, DO;  Location: [facility] ORS;  Service: General;  Laterality: Right;   LEFT HEART CATH AND CORONARY ANGIOGRAPHY N/A 11/04/2019   Procedure: LEFT HEART CATH AND CORONARY ANGIOGRAPHY;  Surgeon: Yates Decamp, MD;  Location: [facility] INVASIVE CV LAB;  Service: Cardiovascular;  Laterality: N/A;   LEFT HEART CATH AND CORONARY ANGIOGRAPHY N/A 06/14/2021   Procedure: LEFT HEART CATH AND CORONARY ANGIOGRAPHY;  Surgeon: Yates Decamp, MD;  Location: [facility] INVASIVE CV LAB;  Service: Cardiovascular;  Laterality: N/A;   TONSILLECTOMY  1964  - approximate   WRIST FRACTURE SURGERY  09/25/2006   right    Allergies: Allergies as of 08/21/2023 - Review Complete 08/21/2023  Allergen Reaction Noted   Ace inhibitors Anaphylaxis 03/30/2012   Bee venom Anaphylaxis and Other (See Comments) 01/28/2022   Thorazine [chlorpromazine] Anaphylaxis 03/30/2012   Wasp venom Anaphylaxis 10/17/2019   Levaquin [levofloxacin in d5w] Nausea And Vomiting 08/28/2016   Shrimp [shellfish allergy] Rash and Hives 04/04/2014   Amlodipine  01/25/2021   Atropine sulfate Other (See Comments) 01/28/2022   Elemental sulfur Itching 10/13/2019   Glycopyrrolate Other (See Comments) 01/28/2022   Ramipril Other (See Comments) 01/28/2022   Sulfa antibiotics Other (See Comments) 01/28/2022   Compazine [prochlorperazine edisylate] Anxiety 03/30/2012   Latex Rash 08/28/2016    Medications:  Current Outpatient Medications:    aspirin EC 81 MG tablet, Take 1 tablet (81 mg total) by mouth daily., Disp: 90 tablet, Rfl: 3   chlordiazePOXIDE-Clidinium (LIBRAX PO), Take by mouth as needed. Before meals bid as needed, Disp: , Rfl:     Cyanocobalamin (VITAMIN B-12 PO), Take 1 tablet by mouth in the morning., Disp: , Rfl:    diphenhydrAMINE HCl (BENADRYL PO), Take by mouth as needed., Disp: , Rfl:    EPINEPHrine 0.3 mg/0.3 mL IJ SOAJ injection, Inject 0.3 mg into the muscle once as needed for anaphylaxis., Disp: , Rfl:    esomeprazole (NEXIUM) 40 MG capsule, Take 40 mg by mouth in the morning., Disp: , Rfl:    estradiol (ESTRACE) 0.1 MG/GM vaginal cream, Place 1 g vaginally 3 (three) times a week., Disp: 42.5 g, Rfl: 3   furosemide (LASIX) 20 MG tablet, Take 1 tablet (20 mg total) by mouth as needed., Disp: 30 tablet, Rfl: 2   gabapentin (NEURONTIN) 300 MG capsule, Take 300 mg by mouth 2 (two) times daily., Disp: , Rfl:    HYDROcodone-acetaminophen (NORCO) 5-325 MG tablet, Take 1-2 tablets by mouth every 4 (four) hours as needed for moderate pain (pain score 4-6) or severe pain (pain score 7-10)., Disp: 20 tablet, Rfl: 0   isosorbide mononitrate (IMDUR) 120 MG 24 hr tablet, Take 1 tablet (120 mg total) by mouth daily., Disp: 90 tablet, Rfl: 2   magnesium 30 MG tablet,  Take 30 mg by mouth 2 (two) times daily., Disp: , Rfl:    meloxicam (MOBIC) 15 MG tablet, Take 1 tablet (15 mg total) by mouth daily for 14 days., Disp: 14 tablet, Rfl: 0   naproxen sodium (ALEVE) 220 MG tablet, Take 220 mg by mouth daily as needed., Disp: , Rfl:    nitroGLYCERIN (NITROSTAT) 0.4 MG SL tablet, Place 1 tablet (0.4 mg total) under the tongue every 5 (five) minutes as needed for up to 25 days for chest pain., Disp: 25 tablet, Rfl: 3   ondansetron (ZOFRAN) 4 MG tablet, Take by mouth., Disp: , Rfl:    potassium chloride SA (KLOR-CON M20) 20 MEQ tablet, Take 1 tablet (20 mEq total) by mouth daily., Disp: 30 tablet, Rfl: 2   Probiotic Product (ALIGN PO), Take by mouth., Disp: , Rfl:    propranolol (INDERAL) 20 MG tablet, Take 1 tablet (20 mg total) by mouth 3 (three) times daily., Disp: 270 tablet, Rfl: 1   rosuvastatin (CRESTOR) 10 MG tablet, TAKE 1 TABLET  BY MOUTH EVERY DAY, Disp: 90 tablet, Rfl: 3   sodium chloride (OCEAN) 0.65 % SOLN nasal spray, Place 1 spray into both nostrils as needed for congestion., Disp: , Rfl:    VITAMIN D PO, Take 1 capsule by mouth every other day. In the morning, Disp: , Rfl:   Social History: Social History   Tobacco Use   Smoking status: Never   Smokeless tobacco: Never  Vaping Use   Vaping status: Never Used  Substance Use Topics   Alcohol use: No   Drug use: No    Family Medical History: Family History  Problem Relation Age of Onset   Cardiomyopathy Mother    Coronary artery disease Father    Hypertension Brother    Prostate cancer Brother     Physical Examination: Vitals:   08/21/23 1027  BP: 128/82    General: Patient is in no apparent distress. Attention to examination is appropriate.  Neck:   Supple.  Full range of motion.  Respiratory: Patient is breathing without any difficulty.   NEUROLOGICAL:     Awake, alert, oriented to person, place, and time.  Speech is clear and fluent.   Cranial Nerves: Pupils equal round and reactive to light.  Facial tone is symmetric.  Facial sensation is symmetric. Shoulder shrug is symmetric. Tongue protrusion is midline.  There is no pronator drift.  Strength: Side Biceps Triceps Deltoid Interossei Grip Wrist Ext. Wrist Flex.  R 5 5 5 5 5 5 5   L 5 5 5 5 5 5 5    Side Iliopsoas Quads Hamstring PF DF EHL  R 5 5 5 5 5 5   L 5 5 5 5 5 5    Reflexes are 1+ and symmetric at the biceps, triceps, brachioradialis, patella and achilles.   Hoffman's is absent.   Bilateral upper and lower extremity sensation is intact to light touch.    No evidence of dysmetria noted.  Gait is normal.     Medical Decision Making  Imaging: CT A/P 08/08/2023 IMPRESSION: 1. Superior endplate compression deformity of the L2 vertebral body demonstrating approximately 40% height loss appears new compared to the prior exam dated July 2024. No significant osseous  retropulsion. Recommend correlation with history and physical exam. 2. No evidence of renal or ureteral calculi or hydronephrosis. 3. 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 No abdominal wall hernia visualized. 4.  Aortic Atherosclerosis (ICD10-I70.0). 5. Additional unchanged  ancillary findings as described above.     Electronically Signed   By: Hart Robinsons M.D.   On: 08/08/2023 13:06  X-rays today show no substantial change on my review.   I have personally reviewed the images and agree with the above interpretation.  Assessment and Plan: Ms. Panasuk is a pleasant 77 y.o. female with L2 compression fracture of unknown cause.  Her x-rays appear stable.  She is wearing her brace.  I will add methocarbamol to her current regimen.  She will reduce her hydrocodone to 0.5 to 1 tablet as needed.  I will refill that as well.  We discussed that she will need to have bone density testing.  I would then recommend that she discuss those results with her primary care provider regarding any medical treatment she may need to improve her bone density.  Will see her back in clinic in 4 weeks with lumbar spine x-rays.  I spent a total of 30 minutes in this patient's care today. This time was spent reviewing pertinent records including imaging studies, obtaining and confirming history, performing a directed evaluation, formulating and discussing my recommendations, and documenting the visit within the medical record.      Thank you for involving me in the care of this patient.      Amanda Lambert K. Myer Haff MD, [facility] Neurosurgery

## 2023-08-20 ENCOUNTER — Other Ambulatory Visit: Payer: Self-pay

## 2023-08-20 DIAGNOSIS — S32020G Wedge compression fracture of second lumbar vertebra, subsequent encounter for fracture with delayed healing: Secondary | ICD-10-CM

## 2023-08-20 DIAGNOSIS — S32020S Wedge compression fracture of second lumbar vertebra, sequela: Secondary | ICD-10-CM

## 2023-08-21 ENCOUNTER — Ambulatory Visit
Admission: RE | Admit: 2023-08-21 | Discharge: 2023-08-21 | Disposition: A | Discharge status: Home / Self Care | Payer: Medicare Other | Attending: Neurosurgery | Admitting: Neurosurgery

## 2023-08-21 ENCOUNTER — Ambulatory Visit (INDEPENDENT_AMBULATORY_CARE_PROVIDER_SITE_OTHER): Payer: Medicare Other | Admitting: Neurosurgery

## 2023-08-21 ENCOUNTER — Ambulatory Visit
Admission: RE | Admit: 2023-08-21 | Discharge: 2023-08-21 | Disposition: A | Discharge status: Home / Self Care | Payer: Medicare Other | Source: Ambulatory Visit | Attending: Neurosurgery | Admitting: Neurosurgery

## 2023-08-21 ENCOUNTER — Encounter: Payer: Self-pay | Admitting: Neurosurgery

## 2023-08-21 VITALS — BP 128/82 | Ht 67.0 in | Wt 173.0 lb

## 2023-08-21 DIAGNOSIS — S32020S Wedge compression fracture of second lumbar vertebra, sequela: Secondary | ICD-10-CM | POA: Insufficient documentation

## 2023-08-21 DIAGNOSIS — M4316 Spondylolisthesis, lumbar region: Secondary | ICD-10-CM | POA: Diagnosis not present

## 2023-08-21 DIAGNOSIS — S32020A Wedge compression fracture of second lumbar vertebra, initial encounter for closed fracture: Secondary | ICD-10-CM

## 2023-08-21 DIAGNOSIS — S32029A Unspecified fracture of second lumbar vertebra, initial encounter for closed fracture: Secondary | ICD-10-CM | POA: Diagnosis not present

## 2023-08-21 DIAGNOSIS — Z780 Asymptomatic menopausal state: Secondary | ICD-10-CM

## 2023-08-21 DIAGNOSIS — M4187 Other forms of scoliosis, lumbosacral region: Secondary | ICD-10-CM | POA: Diagnosis not present

## 2023-08-21 MED ORDER — HYDROCODONE-ACETAMINOPHEN 5-325 MG PO TABS: 0.5000 | ORAL_TABLET | ORAL | 0 refills | Status: AC | PRN

## 2023-08-21 MED ORDER — METHOCARBAMOL 500 MG PO TABS: 500.0000 mg | ORAL_TABLET | Freq: Four times a day (QID) | ORAL | 0 refills | Status: DC | PRN

## 2023-08-28 ENCOUNTER — Telehealth: Payer: Self-pay | Admitting: Neurosurgery

## 2023-08-28 DIAGNOSIS — S32020S Wedge compression fracture of second lumbar vertebra, sequela: Secondary | ICD-10-CM

## 2023-08-28 NOTE — Telephone Encounter (Addendum)
Xray has been ordered. She can get this done at her convenience this week. If she calls [place] or sends a mychart message after she has it done, we can as Dr Myer Haff to review it.  Can schedule with a PA if she wants to be seen. Recommend ER if pain is severe or she has new weakness. I discussed the above with the patient. She states she will likely get the xray on Thursday and she will notify us after.

## 2023-08-28 NOTE — Telephone Encounter (Signed)
Patient is calling that she is having excruciating back pain that started over the weekend. She was doing completely fine. She was down to taking only half a hydrocodone a day. She feels that she might have over done it during Thanksgiving. She is now taking 1 hydrocodone a day, gabapentin, methocarbamol, and OTC extra strength tylenol. She is asking to be seen asap with Dr.Yarbrough. She did not schedule her bone scan yet so I transferred her call to scheduling. Can I schedule her with Dr.Y?  FYI she is scheduled for bone scan on 08/30/2023. Should we see her next Tuesday with Dr.Y?

## 2023-08-30 ENCOUNTER — Inpatient Hospital Stay: Payer: Medicare Other

## 2023-08-30 ENCOUNTER — Emergency Department: Payer: Medicare Other

## 2023-08-30 ENCOUNTER — Inpatient Hospital Stay
Admission: EM | Admit: 2023-08-30 | Discharge: 2023-09-02 | DRG: 543 | Disposition: A | Discharge status: Home / Self Care | Payer: Medicare Other | Attending: Internal Medicine | Admitting: Internal Medicine

## 2023-08-30 ENCOUNTER — Encounter: Payer: Self-pay | Admitting: Emergency Medicine

## 2023-08-30 ENCOUNTER — Other Ambulatory Visit: Payer: Self-pay

## 2023-08-30 ENCOUNTER — Other Ambulatory Visit: Payer: BLUE CROSS/BLUE SHIELD

## 2023-08-30 DIAGNOSIS — I5032 Chronic diastolic (congestive) heart failure: Secondary | ICD-10-CM | POA: Diagnosis not present

## 2023-08-30 DIAGNOSIS — R29890 Loss of height: Secondary | ICD-10-CM | POA: Diagnosis not present

## 2023-08-30 DIAGNOSIS — M48061 Spinal stenosis, lumbar region without neurogenic claudication: Secondary | ICD-10-CM | POA: Diagnosis not present

## 2023-08-30 DIAGNOSIS — S32020D Wedge compression fracture of second lumbar vertebra, subsequent encounter for fracture with routine healing: Secondary | ICD-10-CM | POA: Diagnosis not present

## 2023-08-30 DIAGNOSIS — Z882 Allergy status to sulfonamides status: Secondary | ICD-10-CM

## 2023-08-30 DIAGNOSIS — M8088XA Other osteoporosis with current pathological fracture, vertebra(e), initial encounter for fracture: Secondary | ICD-10-CM

## 2023-08-30 DIAGNOSIS — I700 Atherosclerosis of aorta: Secondary | ICD-10-CM | POA: Diagnosis present

## 2023-08-30 DIAGNOSIS — Z79899 Other long term (current) drug therapy: Secondary | ICD-10-CM | POA: Diagnosis not present

## 2023-08-30 DIAGNOSIS — E876 Hypokalemia: Secondary | ICD-10-CM | POA: Diagnosis not present

## 2023-08-30 DIAGNOSIS — M8008XA Age-related osteoporosis with current pathological fracture, vertebra(e), initial encounter for fracture: Secondary | ICD-10-CM | POA: Diagnosis not present

## 2023-08-30 DIAGNOSIS — E7800 Pure hypercholesterolemia, unspecified: Secondary | ICD-10-CM | POA: Diagnosis not present

## 2023-08-30 DIAGNOSIS — Z91030 Bee allergy status: Secondary | ICD-10-CM

## 2023-08-30 DIAGNOSIS — Z7982 Long term (current) use of aspirin: Secondary | ICD-10-CM | POA: Diagnosis not present

## 2023-08-30 DIAGNOSIS — Z66 Do not resuscitate: Secondary | ICD-10-CM | POA: Diagnosis present

## 2023-08-30 DIAGNOSIS — K219 Gastro-esophageal reflux disease without esophagitis: Secondary | ICD-10-CM | POA: Diagnosis present

## 2023-08-30 DIAGNOSIS — M4316 Spondylolisthesis, lumbar region: Secondary | ICD-10-CM | POA: Diagnosis not present

## 2023-08-30 DIAGNOSIS — I509 Heart failure, unspecified: Secondary | ICD-10-CM

## 2023-08-30 DIAGNOSIS — S32020A Wedge compression fracture of second lumbar vertebra, initial encounter for closed fracture: Secondary | ICD-10-CM | POA: Diagnosis not present

## 2023-08-30 DIAGNOSIS — I130 Hypertensive heart and chronic kidney disease with heart failure and stage 1 through stage 4 chronic kidney disease, or unspecified chronic kidney disease: Secondary | ICD-10-CM | POA: Diagnosis present

## 2023-08-30 DIAGNOSIS — S32000A Wedge compression fracture of unspecified lumbar vertebra, initial encounter for closed fracture: Secondary | ICD-10-CM | POA: Diagnosis not present

## 2023-08-30 DIAGNOSIS — N1831 Chronic kidney disease, stage 3a: Secondary | ICD-10-CM | POA: Diagnosis present

## 2023-08-30 DIAGNOSIS — M47816 Spondylosis without myelopathy or radiculopathy, lumbar region: Secondary | ICD-10-CM | POA: Diagnosis not present

## 2023-08-30 DIAGNOSIS — Z955 Presence of coronary angioplasty implant and graft: Secondary | ICD-10-CM

## 2023-08-30 DIAGNOSIS — Z91040 Latex allergy status: Secondary | ICD-10-CM

## 2023-08-30 DIAGNOSIS — M545 Low back pain, unspecified: Secondary | ICD-10-CM | POA: Diagnosis not present

## 2023-08-30 DIAGNOSIS — Z8249 Family history of ischemic heart disease and other diseases of the circulatory system: Secondary | ICD-10-CM

## 2023-08-30 DIAGNOSIS — R52 Pain, unspecified: Secondary | ICD-10-CM

## 2023-08-30 DIAGNOSIS — IMO0001 Reserved for inherently not codable concepts without codable children: Secondary | ICD-10-CM

## 2023-08-30 DIAGNOSIS — Z888 Allergy status to other drugs, medicaments and biological substances status: Secondary | ICD-10-CM | POA: Diagnosis not present

## 2023-08-30 DIAGNOSIS — S32020B Wedge compression fracture of second lumbar vertebra, initial encounter for open fracture: Principal | ICD-10-CM

## 2023-08-30 DIAGNOSIS — I10 Essential (primary) hypertension: Secondary | ICD-10-CM | POA: Diagnosis present

## 2023-08-30 DIAGNOSIS — M549 Dorsalgia, unspecified: Secondary | ICD-10-CM | POA: Diagnosis not present

## 2023-08-30 DIAGNOSIS — S22080A Wedge compression fracture of T11-T12 vertebra, initial encounter for closed fracture: Secondary | ICD-10-CM

## 2023-08-30 DIAGNOSIS — M4854XA Collapsed vertebra, not elsewhere classified, thoracic region, initial encounter for fracture: Secondary | ICD-10-CM | POA: Diagnosis not present

## 2023-08-30 DIAGNOSIS — Z91013 Allergy to seafood: Secondary | ICD-10-CM

## 2023-08-30 DIAGNOSIS — Z23 Encounter for immunization: Secondary | ICD-10-CM

## 2023-08-30 DIAGNOSIS — I25118 Atherosclerotic heart disease of native coronary artery with other forms of angina pectoris: Secondary | ICD-10-CM | POA: Diagnosis present

## 2023-08-30 LAB — CBC WITH DIFFERENTIAL/PLATELET
Abs Immature Granulocytes: 0.02 10*3/uL (ref 0.00–0.07)
Basophils Absolute: 0.1 10*3/uL (ref 0.0–0.1)
Basophils Relative: 1 %
Eosinophils Absolute: 0.3 10*3/uL (ref 0.0–0.5)
Eosinophils Relative: 4 %
HCT: 34.6 % — ABNORMAL LOW (ref 36.0–46.0)
Hemoglobin: 11.2 g/dL — ABNORMAL LOW (ref 12.0–15.0)
Immature Granulocytes: 0 %
Lymphocytes Relative: 25 %
Lymphs Abs: 1.8 10*3/uL (ref 0.7–4.0)
MCH: 29.2 pg (ref 26.0–34.0)
MCHC: 32.4 g/dL (ref 30.0–36.0)
MCV: 90.1 fL (ref 80.0–100.0)
Monocytes Absolute: 0.5 10*3/uL (ref 0.1–1.0)
Monocytes Relative: 7 %
Neutro Abs: 4.4 10*3/uL (ref 1.7–7.7)
Neutrophils Relative %: 63 %
Platelets: 258 10*3/uL (ref 150–400)
RBC: 3.84 MIL/uL — ABNORMAL LOW (ref 3.87–5.11)
RDW: 12.1 % (ref 11.5–15.5)
WBC: 6.9 10*3/uL (ref 4.0–10.5)
nRBC: 0 % (ref 0.0–0.2)

## 2023-08-30 LAB — BASIC METABOLIC PANEL
Anion gap: 9 (ref 5–15)
BUN: 12 mg/dL (ref 8–23)
CO2: 28 mmol/L (ref 22–32)
Calcium: 9 mg/dL (ref 8.9–10.3)
Chloride: 104 mmol/L (ref 98–111)
Creatinine, Ser: 0.9 mg/dL (ref 0.44–1.00)
GFR, Estimated: >60 mL/min (ref >60)
Glucose, Bld: 105 mg/dL — ABNORMAL HIGH (ref 70–99)
Potassium: 3.5 mmol/L (ref 3.5–5.1)
Sodium: 141 mmol/L (ref 135–145)

## 2023-08-30 MED ORDER — METOCLOPRAMIDE HCL 5 MG/ML IJ SOLN
10.0000 mg | Freq: Once | INTRAMUSCULAR | Status: AC
  Administered 2023-08-30: 10 mg via INTRAVENOUS
  Filled 2023-08-30: qty 2

## 2023-08-30 MED ORDER — ONDANSETRON HCL 4 MG/2ML IJ SOLN
4.0000 mg | Freq: Once | INTRAMUSCULAR | Status: AC
  Administered 2023-08-30: 4 mg via INTRAVENOUS
  Filled 2023-08-30: qty 2

## 2023-08-30 MED ORDER — ROSUVASTATIN CALCIUM 10 MG PO TABS
10.0000 mg | ORAL_TABLET | Freq: Every day | ORAL | Status: DC
  Administered 2023-08-31 – 2023-09-02 (×3): 10 mg via ORAL
  Filled 2023-08-30 (×4): qty 1

## 2023-08-30 MED ORDER — CALCITONIN (SALMON) 200 UNIT/ACT NA SOLN
1.0000 | Freq: Every day | NASAL | Status: DC
  Administered 2023-08-31 – 2023-09-02 (×3): 1 via NASAL
  Filled 2023-08-30 (×2): qty 3.7

## 2023-08-30 MED ORDER — OXYCODONE HCL 5 MG PO TABS
5.0000 mg | ORAL_TABLET | Freq: Four times a day (QID) | ORAL | Status: DC | PRN
  Administered 2023-08-31 – 2023-09-02 (×5): 5 mg via ORAL
  Filled 2023-08-30 (×6): qty 1

## 2023-08-30 MED ORDER — SODIUM CHLORIDE 0.9% FLUSH
3.0000 mL | Freq: Two times a day (BID) | INTRAVENOUS | Status: DC
  Administered 2023-08-30 – 2023-09-02 (×7): 3 mL via INTRAVENOUS

## 2023-08-30 MED ORDER — ISOSORBIDE MONONITRATE ER 30 MG PO TB24
120.0000 mg | ORAL_TABLET | Freq: Every day | ORAL | Status: DC
Start: 2023-08-31 — End: 2023-09-02
  Administered 2023-08-31 – 2023-09-02 (×3): 120 mg via ORAL
  Filled 2023-08-30 (×3): qty 4

## 2023-08-30 MED ORDER — ACETAMINOPHEN 325 MG RE SUPP: 650.0000 mg | Freq: Four times a day (QID) | RECTAL | Status: DC | PRN

## 2023-08-30 MED ORDER — ACETAMINOPHEN 500 MG PO TABS
1000.0000 mg | ORAL_TABLET | Freq: Four times a day (QID) | ORAL | Status: DC
  Administered 2023-08-30 – 2023-09-02 (×10): 1000 mg via ORAL
  Filled 2023-08-30 (×12): qty 2

## 2023-08-30 MED ORDER — ONDANSETRON HCL 4 MG/2ML IJ SOLN
4.0000 mg | Freq: Four times a day (QID) | INTRAMUSCULAR | Status: DC | PRN
  Administered 2023-08-31 – 2023-09-01 (×3): 4 mg via INTRAVENOUS
  Filled 2023-08-30 (×3): qty 2

## 2023-08-30 MED ORDER — MORPHINE SULFATE (PF) 4 MG/ML IV SOLN
4.0000 mg | Freq: Once | INTRAVENOUS | Status: AC
  Administered 2023-08-30: 4 mg via INTRAVENOUS
  Filled 2023-08-30: qty 1

## 2023-08-30 MED ORDER — METHOCARBAMOL 500 MG PO TABS
500.0000 mg | ORAL_TABLET | Freq: Four times a day (QID) | ORAL | Status: DC | PRN
  Administered 2023-08-31: 500 mg via ORAL
  Filled 2023-08-30: qty 1

## 2023-08-30 MED ORDER — PROPRANOLOL HCL 20 MG PO TABS
20.0000 mg | ORAL_TABLET | Freq: Three times a day (TID) | ORAL | Status: DC
  Administered 2023-08-30 – 2023-09-02 (×8): 20 mg via ORAL
  Filled 2023-08-30 (×8): qty 1

## 2023-08-30 MED ORDER — PANTOPRAZOLE SODIUM 40 MG PO TBEC
40.0000 mg | DELAYED_RELEASE_TABLET | Freq: Every day | ORAL | Status: DC
  Administered 2023-08-31 – 2023-09-02 (×3): 40 mg via ORAL
  Filled 2023-08-30 (×3): qty 1

## 2023-08-30 MED ORDER — ONDANSETRON HCL 4 MG PO TABS: 4.0000 mg | ORAL_TABLET | Freq: Four times a day (QID) | ORAL | Status: DC | PRN

## 2023-08-30 MED ORDER — FENTANYL CITRATE PF 50 MCG/ML IJ SOSY
50.0000 ug | PREFILLED_SYRINGE | INTRAMUSCULAR | Status: DC | PRN
  Administered 2023-08-30 – 2023-09-01 (×6): 50 ug via INTRAVENOUS
  Filled 2023-08-30 (×6): qty 1

## 2023-08-30 MED ORDER — FUROSEMIDE 20 MG PO TABS: 20.0000 mg | ORAL_TABLET | Freq: Every day | ORAL | Status: DC | PRN

## 2023-08-30 MED ORDER — GABAPENTIN 300 MG PO CAPS
300.0000 mg | ORAL_CAPSULE | Freq: Two times a day (BID) | ORAL | Status: DC
  Administered 2023-08-30 – 2023-09-02 (×6): 300 mg via ORAL
  Filled 2023-08-30 (×6): qty 1

## 2023-08-30 MED ORDER — HYDROMORPHONE HCL 1 MG/ML IJ SOLN
1.0000 mg | Freq: Once | INTRAMUSCULAR | Status: AC
Start: 2023-08-30 — End: 2023-08-30
  Administered 2023-08-30: 1 mg via INTRAVENOUS
  Filled 2023-08-30: qty 1

## 2023-08-30 MED ORDER — VITAMIN B-12 1000 MCG PO TABS
1000.0000 ug | ORAL_TABLET | Freq: Every day | ORAL | Status: DC
  Administered 2023-08-31 – 2023-09-02 (×3): 1000 ug via ORAL
  Filled 2023-08-30 (×3): qty 1

## 2023-08-30 MED ORDER — ACETAMINOPHEN 325 MG PO TABS: 650.0000 mg | ORAL_TABLET | Freq: Four times a day (QID) | ORAL | Status: DC | PRN

## 2023-08-30 NOTE — ED Notes (Signed)
Pt went to MRI  Friend remains at bedside

## 2023-08-30 NOTE — ED Notes (Signed)
Pt. Moved to hospital bed, repositioned for comfort. Dr. Huel Cote at bedside, updating pt. And family.pain meds given. Pt. Denies further need at this time, NAD.

## 2023-08-30 NOTE — Assessment & Plan Note (Signed)
Blood pressure is elevated, however this is in the setting of acute pain.  - Continue home Imdur and propranolol

## 2023-08-30 NOTE — ED Provider Notes (Signed)
[facility] Provider Note    Event Date/Time   First MD Initiated Contact with Patient 08/30/23 1142     (approximate)   History   Back Pain   HPI  Amanda Lambert is a 77 y.o. female with history of hypertension, GERD, angina, known lumbar fracture at L2 presents emergency department with worsening pain in the lumbar spine.  States even increased her hydrocodone which is not helping.  States she is having difficulty making it to the bathroom but she does feel the urge to urinate and defecate.  No new injury.  States she called neurosurgeons office and they told her she to get another x-ray.  She tried to go to the outpatient but could not tolerate driving etc.  States they told her to just come to the emergency department.      Physical Exam   Triage Vital Signs: ED Triage Vitals  Encounter Vitals Group     BP 08/30/23 1052 (!) 140/92     Systolic BP Percentile --      Diastolic BP Percentile --      Pulse Rate 08/30/23 1052 69     Resp --      Temp 08/30/23 1052 98.4 F (36.9 C)     Temp Source 08/30/23 1052 Oral     SpO2 08/30/23 1052 95 %     Weight 08/30/23 1053 173 lb (78.5 kg)     Height 08/30/23 1053 5\' 7"  (1.702 m)     Head Circumference --      Peak Flow --      Pain Score 08/30/23 1053 7     Pain Loc --      Pain Education --      Exclude from Growth Chart --     Most recent vital signs: Vitals:   08/30/23 1052 08/30/23 1359  BP: (!) 140/92 (!) 176/87  Pulse: 69 75  Resp:  20  Temp: 98.4 F (36.9 C)   SpO2: 95% 91%     General: Awake, no distress.   CV:  Good peripheral perfusion. regular rate and  rhythm Resp:  Normal effort.  Abd:  No distention.   Other:  Lumbar spine tender, 5/5 strength lower extremities, neurovascular is intact   ED Results / Procedures / Treatments   Labs (all labs ordered are listed, but only abnormal results are displayed) Labs Reviewed  BASIC METABOLIC PANEL - Abnormal; Notable for the  following components:      Result Value   Glucose, Bld 105 (*)    All other components within normal limits  CBC WITH DIFFERENTIAL/PLATELET - Abnormal; Notable for the following components:   RBC 3.84 (*)    Hemoglobin 11.2 (*)    HCT 34.6 (*)    All other components within normal limits     EKG     RADIOLOGY CT lumbar    PROCEDURES:   Procedures   MEDICATIONS ORDERED IN ED: Medications  metoCLOPramide (REGLAN) injection 10 mg (has no administration in time range)  morphine (PF) 4 MG/ML injection 4 mg (4 mg Intravenous Given 08/30/23 1215)  ondansetron (ZOFRAN) injection 4 mg (4 mg Intravenous Given 08/30/23 1216)  HYDROmorphone (DILAUDID) injection 1 mg (1 mg Intravenous Given 08/30/23 1328)     IMPRESSION / MDM / ASSESSMENT AND PLAN / ED COURSE  I reviewed the triage vital signs and the nursing notes.  Differential diagnosis includes, but is not limited to, compression fracture, burst fracture, cauda equina, inadequate pain control  Patient's presentation is most consistent with acute illness / injury with system symptoms.   Due to the known history of lumbar fracture with increasing pain we will do CT lumbar spine to assess for burst fracture  Meds given: Morphine 4 mg IV, Zofran 4 mg IV   On recheck patient she continues to have pain, Dilaudid 1 mg IV ordered, I anticipate the patient will be admitted for at least pain control so we will have nursing staff get basic labs  Labs are reassuring  Consult to neurosurgery, spoke with Dr. Marcell Barlow, states feels like it stable but patient may benefit from kyphoplasty  Consult to IR, states that patient is admitted have hospital consult for kyphoplasty  Consult hospitalist, patient being admitted for intractable pain, spoke with Dr.Basaraba, will be admitting and patient is stable  Patient is now nauseated and vomiting, added reglan for nausea   FINAL CLINICAL IMPRESSION(S) / ED  DIAGNOSES   Final diagnoses:  Compression fracture of L2 lumbar vertebra, open, initial encounter (HCC)  Intractable pain     Rx / DC Orders   ED Discharge Orders     None        Note:  This document was prepared using Dragon voice recognition software and may include unintentional dictation errors.    Faythe Ghee, PA-C 08/30/23 1444    Corena Herter, MD 08/30/23 [phone]

## 2023-08-30 NOTE — Assessment & Plan Note (Addendum)
Patient is presenting with known L2 and L3 compression fracture, now with new T12 and L1.  Suspect this is due to severe osteoporosis.  Plan is for potential kyphoplasty.   - Neurosurgery and IR consulted; appreciate their recommendations - MRI lumbar spine pending - Pain control with scheduled Tylenol and calcitonin nasal spray, in addition to as needed oxycodone and fentanyl - Zofran as needed for nausea - Discussed continued need for outpatient workup of suspected osteoporosis, however DEXA scan may need to be delayed

## 2023-08-30 NOTE — Telephone Encounter (Signed)
FYI Patient is on her way to the ER due to the excruciating pain she is still in.

## 2023-08-30 NOTE — Assessment & Plan Note (Addendum)
Patient appears essentially euvolemic on examination with the exception of trace pitting edema.  - Continue home Lasix - Daily weights - Strict in/out

## 2023-08-30 NOTE — ED Triage Notes (Signed)
Says increase in back pain last few days.  Med changes by neurosurg, but they recommended she get an xray to see if the vertebrae collapsed.  She was in too much pain to drive.

## 2023-08-30 NOTE — Assessment & Plan Note (Signed)
No chest pain reported at this time.  - Continue home Imdur, propranolol, and statin - Hold home aspirin in anticipation of procedure.  Will need to restart as soon as possible afterwards

## 2023-08-30 NOTE — H&P (Signed)
History and Physical    Patient: Amanda Lambert [id number] DOB: 04-17-1946 DOA: 08/30/2023 DOS: the patient was seen and examined on 08/30/2023 PCP: Collene Mares, PA  Patient coming from: Home  Chief Complaint:  Chief Complaint  Patient presents with   Back Pain   HPI: Amanda Lambert is a 77 y.o. female with medical history significant of CAD s/p PCI with DES to LAD (2022) with chronic angina, HFpEF with last EF of 50-55%, bilateral carotid artery disease, hypertension, hyperlipidemia, CKD stage IIIa, who presents to the ED due to worsening back pain.  Amanda Lambert states that she has been experiencing back pain for quite some time now but thought it may due to bursitis.  She went to the ED on 11/13, at which time she was told she had a compression fracture.  She was able to follow-up with neurosurgery and was started on a pain regimen with LSO brace however her pain has continued to significantly worsen.  She has been experiencing nausea that she believes is both from the pain even from her pain medicine.  She denies any lower extremity weakness, numbness or tingling.  Otherwise, she denies any chest pain, shortness of breath.  She notes that lower extremity swelling is at her baseline.  Last dose of aspirin was yesterday morning.  ED course: On arrival to the ED, patient was hypertensive at 140/92 with heart rate of 69.  She was saturating at 95% on room air.  She was afebrile at 98.4.  Initial workup notable for hemoglobin of 11.2, glucose 105, creatinine 0.90 with GFR above 60.  CT of the lumbar spine was obtained that demonstrated stable L2 and L3 compression fractures, however new T12 and L1 compression fracture.  Neurosurgery was consulted in addition to IR.  TR[facility] contacted for admission.  Review of Systems: As mentioned in the history of present illness. All other systems reviewed and are negative.  Past Medical History:  Diagnosis Date   Anemia    Angina pectoris (HCC)     Aortic atherosclerosis (HCC)    Arthritis    Bilateral carotid artery disease (HCC) 10/15/2019   a.) carotid doppler 10/15/2019: 50-69% BICA; b.) carotid doppler 04/06/2020: 50-69% RICA, 1-15% LICA; c.) carotid doppler 11/03/2020 and 04/29/2021: 50-69% RICA, 16-49% LICA; d.) carotid doppler 03/02/2022: 50-69% BICA; e.) carotid doppler 03/14/2023: 50-69% RICA, 16-49% LICA   Biliary dyskinesia    CAD (coronary artery disease) 06/14/2021   a.) LHC 11/04/2019: 50% mLAD - med mgmt; b.) LHC/PCI 06/14/2021: 40% p-mLAD, 80% mLAD (3.0 x 30 mm Onyx Frontier DES; c.) LHC for FFR study: 07/19/2021: 40% pLAD; RFR 0.92, FFR 0.89, CFR 4.8, IMR 18 --> no significant macro/microvascular disease   CKD (chronic kidney disease), stage III (HCC)    Complication of anesthesia    a.) PONV; b.) delayed emergence   Constipation    Diastolic dysfunction 10/15/2019   a.) TTE 10/15/2019: EF 50-55%, norm LAP, mild LA dil, mod MR, mild-mod TR, G1DD; b.) TTE 05/19/2021: EF 50-55%, mild basal sep hypertrophy, norm LAP, triv MR/TR, G1DD   GERD (gastroesophageal reflux disease)    Hepatitis B 1975   Hypercholesteremia    Hypertension    Inguinal hernia    Irritable bowel syndrome without diarrhea    LBBB (left bundle branch block)    Leukopenia    Long term current use of aspirin    PONV (postoperative nausea and vomiting)    [facility] spotted fever 04/01/2012   adm. to hospital  Syncope 10/17/2019   Thrombocytopenia (HCC)    Transaminitis    Trochanteric bursitis, right hip    Vaginal Pap smear, abnormal    Varicose veins of leg with edema, bilateral    Weakness    Past Surgical History:  Procedure Laterality Date   APPENDECTOMY  09/25/1961   CHOLECYSTECTOMY  04/26/2012   Procedure: LAPAROSCOPIC CHOLECYSTECTOMY WITH INTRAOPERATIVE CHOLANGIOGRAM;  Surgeon: Robyne Askew, MD;  Location: [facility] ORS;  Service: General;  Laterality: N/A;   COLONOSCOPY     CORONARY ANGIOGRAPHY N/A 07/19/2021   Procedure:  CORONARY ANGIOGRAPHY (CATH LAB);  Surgeon: Yates Decamp, MD;  Location: [facility] INVASIVE CV LAB;  Service: Cardiovascular;  Laterality: N/A;   CORONARY IMAGING/OCT N/A 06/14/2021   Procedure: INTRAVASCULAR IMAGING/OCT;  Surgeon: Yates Decamp, MD;  Location: [facility] INVASIVE CV LAB;  Service: Cardiovascular;  Laterality: N/A;   CORONARY PRESSURE/FFR STUDY N/A 07/19/2021   Procedure: INTRAVASCULAR PRESSURE WIRE/FFR STUDY;  Surgeon: Yates Decamp, MD;  Location: [facility] INVASIVE CV LAB;  Service: Cardiovascular;  Laterality: N/A;   CORONARY STENT INTERVENTION N/A 06/14/2021   Procedure: CORONARY STENT INTERVENTION;  Surgeon: Yates Decamp, MD;  Location: [facility] INVASIVE CV LAB;  Service: Cardiovascular;  Laterality: N/A;   DILATION AND CURETTAGE OF UTERUS  09/25/1980   for miscarriage   DILATION AND CURETTAGE, DIAGNOSTIC / THERAPEUTIC  09/25/1970   INSERTION OF MESH Right 05/03/2023   Procedure: INSERTION OF MESH;  Surgeon: Sung Amabile, DO;  Location: [facility] ORS;  Service: General;  Laterality: Right;   LEFT HEART CATH AND CORONARY ANGIOGRAPHY N/A 11/04/2019   Procedure: LEFT HEART CATH AND CORONARY ANGIOGRAPHY;  Surgeon: Yates Decamp, MD;  Location: [facility] INVASIVE CV LAB;  Service: Cardiovascular;  Laterality: N/A;   LEFT HEART CATH AND CORONARY ANGIOGRAPHY N/A 06/14/2021   Procedure: LEFT HEART CATH AND CORONARY ANGIOGRAPHY;  Surgeon: Yates Decamp, MD;  Location: [facility] INVASIVE CV LAB;  Service: Cardiovascular;  Laterality: N/A;   TONSILLECTOMY  1964  - approximate   WRIST FRACTURE SURGERY  09/25/2006   right   Social History:  reports that she has never smoked. She has never used smokeless tobacco. She reports that she does not drink alcohol and does not use drugs.  Allergies  Allergen Reactions   Ace Inhibitors Anaphylaxis    Angioedema.   Bee Venom Anaphylaxis and Other (See Comments)   Thorazine [Chlorpromazine] Anaphylaxis   Wasp Venom Anaphylaxis   Levaquin [Levofloxacin In D5w] Nausea And Vomiting    Stated by patient she  could not handle levaquin even with antiemetics    Shrimp [Shellfish Allergy] Rash and Hives    "splotches"    Amlodipine     severe leg edema   Atropine Sulfate Other (See Comments)   Elemental Sulfur Itching        Glycopyrrolate Other (See Comments)   Ramipril Other (See Comments)   Sulfa Antibiotics Other (See Comments)   Compazine [Prochlorperazine Edisylate] Anxiety   Latex Rash    Family History  Problem Relation Age of Onset   Cardiomyopathy Mother    Coronary artery disease Father    Hypertension Brother    Prostate cancer Brother     Prior to Admission medications   Medication Sig Start Date End Date Taking? Authorizing Provider  aspirin EC 81 MG tablet Take 1 tablet (81 mg total) by mouth daily. 10/13/19   Yates Decamp, MD  chlordiazePOXIDE-Clidinium (LIBRAX PO) Take by mouth as needed. Before meals bid as needed    [provider]  Cyanocobalamin (VITAMIN B-12 PO) Take 1 tablet by mouth in the morning.    [provider]  diphenhydrAMINE HCl (BENADRYL PO) Take by mouth as needed.    [provider]  EPINEPHrine 0.3 mg/0.3 mL IJ SOAJ injection Inject 0.3 mg into the muscle once as needed for anaphylaxis.    [provider]  esomeprazole (NEXIUM) 40 MG capsule Take 40 mg by mouth in the morning.    [provider]  estradiol (ESTRACE) 0.1 MG/GM vaginal cream Place 1 g vaginally 3 (three) times a week. 07/02/23   Milas Hock, MD  furosemide (LASIX) 20 MG tablet Take 1 tablet (20 mg total) by mouth as needed. 08/03/23   Jodelle Gross, NP  gabapentin (NEURONTIN) 300 MG capsule Take 300 mg by mouth 2 (two) times daily. 02/14/21   [provider]  isosorbide mononitrate (IMDUR) 120 MG 24 hr tablet Take 1 tablet (120 mg total) by mouth daily. 08/03/23   Jodelle Gross, NP  magnesium 30 MG tablet Take 30 mg by mouth 2 (two) times daily.    [provider]  methocarbamol (ROBAXIN) 500 MG tablet Take 1 tablet  (500 mg total) by mouth every 6 (six) hours as needed for muscle spasms. 08/21/23   Venetia Night, MD  nitroGLYCERIN (NITROSTAT) 0.4 MG SL tablet Place 1 tablet (0.4 mg total) under the tongue every 5 (five) minutes as needed for up to 25 days for chest pain. 08/03/23 08/28/23  Jodelle Gross, NP  ondansetron (ZOFRAN) 4 MG tablet Take by mouth. 04/04/23   [provider]  potassium chloride SA (KLOR-CON M20) 20 MEQ tablet Take 1 tablet (20 mEq total) by mouth daily. 08/03/23 11/01/23  Jodelle Gross, NP  Probiotic Product (ALIGN PO) Take by mouth.    [provider]  propranolol (INDERAL) 20 MG tablet Take 1 tablet (20 mg total) by mouth 3 (three) times daily. 07/16/23   Yates Decamp, MD  rosuvastatin (CRESTOR) 10 MG tablet TAKE 1 TABLET BY MOUTH EVERY DAY 06/29/23   Yates Decamp, MD  sodium chloride (OCEAN) 0.65 % SOLN nasal spray Place 1 spray into both nostrils as needed for congestion.    [provider]  VITAMIN D PO Take 1 capsule by mouth every other day. In the morning    [provider]    Physical Exam: Vitals:   08/30/23 1052 08/30/23 1053 08/30/23 1359 08/30/23 1556  BP: (!) 140/92  (!) 176/87 (!) 170/80  Pulse: 69  75 78  Resp:   20 18  Temp: 98.4 F (36.9 C)   98 F (36.7 C)  TempSrc: Oral   Oral  SpO2: 95%  91% 94%  Weight:  78.5 kg    Height:  5\' 7"  (1.702 m)     Physical Exam Vitals and nursing note reviewed.  HENT:     Head: Normocephalic and atraumatic.  Cardiovascular:     Rate and Rhythm: Normal rate and regular rhythm.     Heart sounds: No murmur heard. Pulmonary:     Effort: Pulmonary effort is normal.     Breath sounds: Normal breath sounds. No rales.  Musculoskeletal:     Comments: Trace bilateral pitting edema.   Skin:    General: Skin is warm and dry.  Neurological:     Mental Status: She is alert and oriented to person, place, and time. Mental status is at baseline.     Sensory: No sensory deficit.      Motor:  No weakness.  Psychiatric:        Mood and Affect: Mood normal.        Behavior: Behavior normal.    Data Reviewed: CBC with WBC of 6.9, hemoglobin of 11.2, MCV of 90.1, platelets of 258 BMP with sodium of 141, potassium 3.5, bicarb 28, glucose 105, BUN 12, creatinine 0.90 with GFR above 60  CT Lumbar Spine Wo Contrast  Result Date: 08/30/2023 CLINICAL DATA:  Provided history: Compression fracture, lumbar. Spine fracture, lumbar, traumatic. EXAM: CT LUMBAR SPINE WITHOUT CONTRAST TECHNIQUE: Multidetector CT imaging of the lumbar spine was performed without intravenous contrast administration. Multiplanar CT image reconstructions were also generated. RADIATION DOSE REDUCTION: This exam was performed according to the departmental dose-optimization program which includes automated exposure control, adjustment of the mA and/or kV according to patient size and/or use of iterative reconstruction technique. COMPARISON:  Lumbar spine radiographs 08/21/2023. CT abdomen/pelvis 08/08/2023. CT abdomen/pelvis 04/03/2023. FINDINGS: Segmentation: 5 lumbar vertebrae. The caudal most well-formed tubal disc space is designated L5-S1. Alignment: Levocurvature of lumbar spine. Bony retropulsion at the T12 and L2 levels as described below. 5 mm L4-L5 grade 1 anterolisthesis. Vertebrae: Acute T12 superior endplate vertebral compression fracture (25% height loss), new from the prior lumbar spine radiographs of 08/21/2023. 4 mm bony retropulsion at the level of the T12 superior endplate. Acute L1 inferior endplate vertebral compression fracture (25% vertebral body height loss has progressed from the prior lumbar spine radiographs. L2 superior endplate vertebral compression fracture (40% height loss), unchanged from the prior CT abdomen/pelvis of 08/08/2023. 3 mm bony retropulsion at the level of the L2 superior endplate, also unchanged. L3 superior endplate vertebral compression fracture with superimposed Schmorl node  (40% height loss), unchanged from the prior CT abdomen/pelvis of 08/08/2023. Paraspinal and other soft tissues: No acute finding within included portions of the abdomen/retroperitoneum. Aortoiliac atherosclerosis. Disc levels: Multilevel disc space narrowing, greatest at L2-L3 (mild to moderate at this level). T11-T12: 4 mm bony retropulsion at the level of the T12 superior endplate. No more than mild spinal canal narrowing is appreciated. No significant neural foraminal narrowing. T12-L1: No significant disc herniation or stenosis appreciated. L1-L2: 3 mm bony retropulsion at the level of the L2 superior endplate. Disc bulge. Mild spinal canal narrowing. Mild bilateral neural foraminal narrowing. L2-L3: Disc bulge. Facet arthropathy. Ligamentum flavum hypertrophy. Bilateral subarticular stenosis. Suspected moderate central canal stenosis. Mild bilateral neural foraminal narrowing. L3-L4: Disc bulge. Facet arthropathy. Apparent mild bilateral subarticular and central canal narrowing. Mild bilateral neural foraminal narrowing. L4-L5: 5 mm grade 1 anterolisthesis. Disc uncovering with disc bulge. Facet arthropathy (advanced right, moderate left). Bilateral subarticular stenosis. Suspected moderate central canal stenosis. Bilateral neural foraminal narrowing (moderate/severe right, moderate left). L5-S1: No significant disc herniation or spinal canal stenosis is appreciated. Facet arthropathy (greater on the left). No significant spinal canal stenosis. Mild-to-moderate left neural foraminal narrowing. IMPRESSION: 1. Acute T12 superior endplate vertebral compression fracture (25% height loss), new from the prior lumbar spine radiographs of 08/21/2023. 4 mm bony retropulsion at the level of the T12 superior endplate resulting in mild spinal canal narrowing. 2. Acute L1 inferior endplate vertebral compression fracture. 25% vertebral body height loss has progressed from the prior lumbar spine radiographs of 08/21/2023. 3.  L2 superior endplate vertebral compression fracture (40% height loss), unchanged from the prior CT abdomen/pelvis of 08/08/2023. 3 mm bony retropulsion at the level of the L2 superior endplate resulting in mild spinal canal narrowing. 4. L3 superior endplate vertebral compression fracture with superimposed Schmorl node (  40% height loss), unchanged from the prior CT abdomen/pelvis of 08/08/2023. 5. Lumbar spondylosis as outlined within the body of the report. 6. Levocurvature of the lumbar spine. 7. 5 mm L4-L5 facet mediated grade 1 anterolisthesis. 8.  Aortic Atherosclerosis (ICD10-I70.0). Electronically Signed   By: Jackey Loge D.O.   On: 08/30/2023 14:11    Results are pending, will review when available.  Assessment and Plan:  * Lumbar compression fracture (HCC) Patient is presenting with known L2 and L3 compression fracture, now with new T12 and L1.  Suspect this is due to severe osteoporosis.  Plan is for potential kyphoplasty.   - Neurosurgery and IR consulted; appreciate their recommendations - MRI lumbar spine pending - Pain control with scheduled Tylenol and calcitonin nasal spray, in addition to as needed oxycodone and fentanyl - Zofran as needed for nausea - Discussed continued need for outpatient workup of suspected osteoporosis, however DEXA scan may need to be delayed  CHF (congestive heart failure) (HCC) Patient appears essentially euvolemic on examination with the exception of trace pitting edema.  - Continue home Lasix - Daily weights - Strict in/out  Coronary artery disease of native artery of native heart with stable angina pectoris (HCC) No chest pain reported at this time.  - Continue home Imdur, propranolol, and statin - Hold home aspirin in anticipation of procedure.  Will need to restart as soon as possible afterwards  HTN (hypertension) Blood pressure is elevated, however this is in the setting of acute pain.  - Continue home Imdur and propranolol  Advance  Care Planning:   Code Status: Limited: Do not attempt resuscitation (DNR) -DNR-LIMITED -Do Not Intubate/DNI  per MOST form on file  Consults: Neurosurgery, IR  Family Communication: Patient's friend updated at bedside  Severity of Illness: The appropriate patient status for this patient is INPATIENT. Inpatient status is judged to be reasonable and necessary in order to provide the required intensity of service to ensure the patient's safety. The patient's presenting symptoms, physical exam findings, and initial radiographic and laboratory data in the context of their chronic comorbidities is felt to place them at high risk for further clinical deterioration. Furthermore, it is not anticipated that the patient will be medically stable for discharge from the hospital within 2 midnights of admission.   * I certify that at the point of admission it is my clinical judgment that the patient will require inpatient hospital care spanning beyond 2 midnights from the point of admission due to high intensity of service, high risk for further deterioration and high frequency of surveillance required.*  Author: Verdene Lennert, MD 08/30/2023 5:08 PM  For on call review [email or web address].

## 2023-08-30 NOTE — Telephone Encounter (Signed)
Noted. It appears she has not had the xray ordered on 08/28/23.

## 2023-08-31 DIAGNOSIS — S32000A Wedge compression fracture of unspecified lumbar vertebra, initial encounter for closed fracture: Secondary | ICD-10-CM | POA: Diagnosis not present

## 2023-08-31 DIAGNOSIS — S32020B Wedge compression fracture of second lumbar vertebra, initial encounter for open fracture: Principal | ICD-10-CM

## 2023-08-31 DIAGNOSIS — S32020D Wedge compression fracture of second lumbar vertebra, subsequent encounter for fracture with routine healing: Secondary | ICD-10-CM | POA: Diagnosis not present

## 2023-08-31 DIAGNOSIS — S22080A Wedge compression fracture of T11-T12 vertebra, initial encounter for closed fracture: Secondary | ICD-10-CM | POA: Diagnosis not present

## 2023-08-31 LAB — BASIC METABOLIC PANEL
Anion gap: 13 (ref 5–15)
BUN: 13 mg/dL (ref 8–23)
CO2: 22 mmol/L (ref 22–32)
Calcium: 9.4 mg/dL (ref 8.9–10.3)
Chloride: 104 mmol/L (ref 98–111)
Creatinine, Ser: 0.87 mg/dL (ref 0.44–1.00)
GFR, Estimated: >60 mL/min (ref >60)
Glucose, Bld: 87 mg/dL (ref 70–99)
Potassium: 4 mmol/L (ref 3.5–5.1)
Sodium: 139 mmol/L (ref 135–145)

## 2023-08-31 LAB — VITAMIN D 25 HYDROXY (VIT D DEFICIENCY, FRACTURES): Vit D, 25-Hydroxy: 28.51 ng/mL — ABNORMAL LOW (ref 30–100)

## 2023-08-31 MED ORDER — LIDOCAINE 5 % EX PTCH
1.0000 | MEDICATED_PATCH | CUTANEOUS | Status: DC
  Administered 2023-08-31 – 2023-09-01 (×2): 1 via TRANSDERMAL
  Filled 2023-08-31 (×2): qty 1

## 2023-08-31 MED ORDER — INFLUENZA VAC A&B SURF ANT ADJ 0.5 ML IM SUSY
0.5000 mL | PREFILLED_SYRINGE | INTRAMUSCULAR | Status: AC
  Administered 2023-09-01: 0.5 mL via INTRAMUSCULAR
  Filled 2023-08-31: qty 0.5

## 2023-08-31 NOTE — Plan of Care (Signed)
Patient admitted with worsening severe back pain found to have multiple compression fractures. Request to IR for image guided kyphoplasty/vertebroplasty.  Patient history and imaging reviewed by Dr. Archer Asa who approves patient for T12, L1 and L2 kyphoplasty/vertebroplasty.   Plan: - LD ASA 81 mg 12/4 per chart, this medication requires a 5 day hold due to high bleeding risk with KP/VP - First available radiologist who performs this procedure is Wednesday 12/11 - If patient is able to be discharged home prior to Wednesday 12/11 please contact IR so she may be setup for outpatient procedure - If patient remains in house we will plan for procedure Wednesday 12/11 with moderate sedation - Formal consult/consent early next  Please call IR with questions or concerns.  Lynnette Caffey, PA-C

## 2023-08-31 NOTE — Plan of Care (Signed)

## 2023-08-31 NOTE — Progress Notes (Signed)
Progress Note   Patient: Amanda Lambert [id number] DOB: 1946-03-22 DOA: 08/30/2023     1 DOS: the patient was seen and examined on 08/31/2023   Brief hospital course: 77yo with h/o CAD s/p stent with chronic angina, HFpEF, B carotid disease, HTN, HLD, and stage 3a CKD who presented on 12/5 with worsening back pain.  She was seen for this issue on 11/13 and diagnosed with a compression fracture; she saw neurosurgery as an outpatient but her pain worsened despite LSO brace.  Imaging showed known L2-3 compression fractures as well as new T12 and L1 fractures that are thought to be due to severe osteoporosis.  Neurosurgery and IR were consulted, with plan for kyphoplasty.    Assessment and Plan:  Lumbar compression fracture  Patient is presenting with known L2 and L3 compression fracture, now with new T12 and L1 Suspect this is due to severe osteoporosis - she reports taking bisphosphonate therapy for maybe 1-2 years very remotely  Neurosurgery consulted, recommended LSO brace and IR consult IR consulted; she has been approved for T12, L2, L2 kyphoplasty/vertebroplasty and will need a 5 day hold of ASA due to high bleeding risk This procedure can be performed in-house on 12/11 if she remains hospitalized or will be arranged as an outpatient if she discharges sooner  Pain control with scheduled Tylenol, lidocaine, Robaxin, and calcitonin nasal spray, in addition to as needed oxycodone and fentanyl Zofran as needed for nausea Continue gabapentin, methocarbamol Discussed continued need for outpatient workup of suspected osteoporosis, however DEXA scan may need to be delayed   Chronic diastolic CHF (congestive heart failure)  04/2021 echo with preserved EF and grade 1 diastolic dysfunction Patient appears essentially euvolemic on examination with the exception of trace pitting edema Continue home Lasix   Coronary artery disease of native artery of native heart with stable angina pectoris No chest  pain reported at this time  Continue home Imdur, propranolol, and rosuvastatin Hold home aspirin in anticipation of procedure.  Will need to restart as soon as possible afterwards   HTN (hypertension) Continue home propranolol  GERD Continue PPI  DNR Confirmed on admission She will need a gold out of facility DNR form at the time of discharge    Consultants: Neurosurgery IR (telephone only) PT OT  Procedures: None  Antibiotics: None  30 Day Unplanned Readmission Risk Score    Flowsheet Row ED to Hosp-Admission (Current) from 08/30/2023 in [facility] (1A)  30 Day Unplanned Readmission Risk Score (%) 15.47 Filed at 08/31/2023 0801       This score is the patient's risk of an unplanned readmission within 30 days of being discharged (0 -100%). The score is based on dignosis, age, lab data, medications, orders, and past utilization.   Low:  0-14.9   Medium: 15-21.9   High: 22-29.9   Extreme: 30 and above           Subjective: Pain is better controlled.  Has not needed IV pain medications recently.  If able to only take PO meds, wants to go home tomorrow and f/u as an outpatient.   Objective: Vitals:   08/31/23 0620 08/31/23 0840  BP:  (!) 167/57  Pulse:  91  Resp:  17  Temp: 98.1 F (36.7 C) 98 F (36.7 C)  SpO2:  98%    Intake/Output Summary (Last 24 hours) at 08/31/2023 1621 Last data filed at 08/31/2023 1056 Gross per 24 hour  Intake --  Output 1 ml  Net -1 ml   Filed Weights   08/30/23 1053  Weight: 78.5 kg    Exam:  General:  Appears calm and comfortable and is in NAD, LSO brace in place Eyes:   EOMI, normal lids, iris ENT:  grossly normal hearing, lips & tongue, mmm Neck:  no LAD, masses or thyromegaly Cardiovascular:  RRR, no m/r/g. No LE edema.  Respiratory:   CTA bilaterally with no wheezes/rales/rhonchi.  Normal respiratory effort. Abdomen:  soft, NT, ND Back:   LSO brace in place Skin:  no rash or  induration seen on limited exam Musculoskeletal:  grossly normal tone BUE/BLE, no bony abnormality Psychiatric:  grossly normal mood and affect, speech fluent and appropriate, AOx3 Neurologic:  CN 2-12 grossly intact, moves all extremities in coordinated fashion  Data Reviewed: I have reviewed the patient's lab results since admission.  Pertinent labs for today include:   Normal BMP 25(OH) Vitamin D is low at 28.51    Family Communication: Son, brother, friends present  Disposition: Status is: Inpatient Remains inpatient appropriate because: ongoing management     Time spent: 50 minutes  Unresulted Labs (From admission, onward)     Start     Ordered   09/01/23 0500  Basic metabolic panel  Tomorrow morning,   R        08/31/23 1621   08/31/23 0500  CBC with Differential/Platelet  Tomorrow morning,   R        08/30/23 1709             Author: Jonah Blue, MD 08/31/2023 4:21 PM  For on call review [email or web address].

## 2023-08-31 NOTE — Consult Note (Signed)
Consult requested by:  Dr. Huel Cote  Consult requested for:  T12 and L2 fracture  Primary Physician:  Collene Mares, PA  History of Present Illness: 08/31/2023 [name] is here today with a chief complaint of worsening pain.  I saw her in clinic approximately 10 days ago.  She was doing well at that time.  Over the past several days, she has had worsening back pain.  She presented with incapacitating pain.  Workup revealed a worsening fracture 2 levels above her previously identified fracture.  She denies any other neurologic symptoms.  08/21/2023 [name] is here today with a chief complaint of pain which precipitated workup showing an L2 compression fracture.  She has no new neurologic symptoms.  She was given an LSO brace and has been compliant with her brace.  It does help her.  Her pain is improved significantly over the past couple of weeks.  She is however still having pain.     Conservative measures:  Physical therapy: has not participated in  Multimodal medical therapy including regular antiinflammatories:  baclofen, lidocaine patches, hydrocodone,  Injections: has not received epidural steroid injections   Past Surgery: no prior spinal surgeries   Amanda Lambert has no symptoms of cervical myelopathy.   The symptoms are causing a significant impact on the patient's life.    I have utilized the care everywhere function in epic to review the outside records available from external health systems.  Review of Systems:  A 10 point review of systems is negative, except for the pertinent positives and negatives detailed in the HPI.  Past Medical History: Past Medical History:  Diagnosis Date   Anemia    Angina pectoris (HCC)    Aortic atherosclerosis (HCC)    Arthritis    Bilateral carotid artery disease (HCC) 10/15/2019   a.) carotid doppler 10/15/2019: 50-69% BICA; b.) carotid doppler 04/06/2020: 50-69% RICA, 1-15% LICA; c.) carotid doppler  11/03/2020 and 04/29/2021: 50-69% RICA, 16-49% LICA; d.) carotid doppler 03/02/2022: 50-69% BICA; e.) carotid doppler 03/14/2023: 50-69% RICA, 16-49% LICA   Biliary dyskinesia    CAD (coronary artery disease) 06/14/2021   a.) LHC 11/04/2019: 50% mLAD - med mgmt; b.) LHC/PCI 06/14/2021: 40% p-mLAD, 80% mLAD (3.0 x 30 mm Onyx Frontier DES; c.) LHC for FFR study: 07/19/2021: 40% pLAD; RFR 0.92, FFR 0.89, CFR 4.8, IMR 18 --> no significant macro/microvascular disease   CKD (chronic kidney disease), stage III (HCC)    Complication of anesthesia    a.) PONV; b.) delayed emergence   Constipation    Diastolic dysfunction 10/15/2019   a.) TTE 10/15/2019: EF 50-55%, norm LAP, mild LA dil, mod MR, mild-mod TR, G1DD; b.) TTE 05/19/2021: EF 50-55%, mild basal sep hypertrophy, norm LAP, triv MR/TR, G1DD   GERD (gastroesophageal reflux disease)    Hepatitis B 1975   Hypercholesteremia    Hypertension    Inguinal hernia    Irritable bowel syndrome without diarrhea    LBBB (left bundle branch block)    Leukopenia    Long term current use of aspirin    PONV (postoperative nausea and vomiting)    [facility] spotted fever 04/01/2012   adm. to hospital   Syncope 10/17/2019   Thrombocytopenia (HCC)    Transaminitis    Trochanteric bursitis, right hip    Vaginal Pap smear, abnormal    Varicose veins of leg with edema, bilateral    Weakness     Past Surgical History: Past Surgical  History:  Procedure Laterality Date   APPENDECTOMY  09/25/1961   CHOLECYSTECTOMY  04/26/2012   Procedure: LAPAROSCOPIC CHOLECYSTECTOMY WITH INTRAOPERATIVE CHOLANGIOGRAM;  Surgeon: Robyne Askew, MD;  Location: [facility] ORS;  Service: General;  Laterality: N/A;   COLONOSCOPY     CORONARY ANGIOGRAPHY N/A 07/19/2021   Procedure: CORONARY ANGIOGRAPHY (CATH LAB);  Surgeon: Yates Decamp, MD;  Location: [facility] INVASIVE CV LAB;  Service: Cardiovascular;  Laterality: N/A;   CORONARY IMAGING/OCT N/A 06/14/2021   Procedure: INTRAVASCULAR  IMAGING/OCT;  Surgeon: Yates Decamp, MD;  Location: [facility] INVASIVE CV LAB;  Service: Cardiovascular;  Laterality: N/A;   CORONARY PRESSURE/FFR STUDY N/A 07/19/2021   Procedure: INTRAVASCULAR PRESSURE WIRE/FFR STUDY;  Surgeon: Yates Decamp, MD;  Location: [facility] INVASIVE CV LAB;  Service: Cardiovascular;  Laterality: N/A;   CORONARY STENT INTERVENTION N/A 06/14/2021   Procedure: CORONARY STENT INTERVENTION;  Surgeon: Yates Decamp, MD;  Location: [facility] INVASIVE CV LAB;  Service: Cardiovascular;  Laterality: N/A;   DILATION AND CURETTAGE OF UTERUS  09/25/1980   for miscarriage   DILATION AND CURETTAGE, DIAGNOSTIC / THERAPEUTIC  09/25/1970   INSERTION OF MESH Right 05/03/2023   Procedure: INSERTION OF MESH;  Surgeon: Sung Amabile, DO;  Location: [facility] ORS;  Service: General;  Laterality: Right;   LEFT HEART CATH AND CORONARY ANGIOGRAPHY N/A 11/04/2019   Procedure: LEFT HEART CATH AND CORONARY ANGIOGRAPHY;  Surgeon: Yates Decamp, MD;  Location: [facility] INVASIVE CV LAB;  Service: Cardiovascular;  Laterality: N/A;   LEFT HEART CATH AND CORONARY ANGIOGRAPHY N/A 06/14/2021   Procedure: LEFT HEART CATH AND CORONARY ANGIOGRAPHY;  Surgeon: Yates Decamp, MD;  Location: [facility] INVASIVE CV LAB;  Service: Cardiovascular;  Laterality: N/A;   TONSILLECTOMY  1964  - approximate   WRIST FRACTURE SURGERY  09/25/2006   right    Allergies: Allergies as of 08/30/2023 - Review Complete 08/30/2023  Allergen Reaction Noted   Ace inhibitors Anaphylaxis 03/30/2012   Bee venom Anaphylaxis and Other (See Comments) 01/28/2022   Thorazine [chlorpromazine] Anaphylaxis 03/30/2012   Wasp venom Anaphylaxis 10/17/2019   Levaquin [levofloxacin in d5w] Nausea And Vomiting 08/28/2016   Shrimp [shellfish allergy] Rash and Hives 04/04/2014   Amlodipine  01/25/2021   Atropine sulfate Other (See Comments) 01/28/2022   Elemental sulfur Itching 10/13/2019   Glycopyrrolate Other (See Comments) 01/28/2022   Ramipril Other (See Comments) 01/28/2022   Sulfa  antibiotics Other (See Comments) 01/28/2022   Compazine [prochlorperazine edisylate] Anxiety 03/30/2012   Latex Rash 08/28/2016    Medications: Current Meds  Medication Sig   aspirin EC 81 MG tablet Take 1 tablet (81 mg total) by mouth daily.   chlordiazePOXIDE-Clidinium (LIBRAX PO) Take by mouth as needed. Before meals bid as needed   Cyanocobalamin (VITAMIN B-12 PO) Take 1 tablet by mouth in the morning.   diphenhydrAMINE HCl (BENADRYL PO) Take by mouth as needed.   EPINEPHrine 0.3 mg/0.3 mL IJ SOAJ injection Inject 0.3 mg into the muscle once as needed for anaphylaxis.   esomeprazole (NEXIUM) 40 MG capsule Take 40 mg by mouth in the morning.   estradiol (ESTRACE) 0.1 MG/GM vaginal cream Place 1 g vaginally 3 (three) times a week.   furosemide (LASIX) 20 MG tablet Take 1 tablet (20 mg total) by mouth as needed.   gabapentin (NEURONTIN) 300 MG capsule Take 300 mg by mouth 2 (two) times daily.   isosorbide mononitrate (IMDUR) 120 MG 24 hr tablet Take 1 tablet (120 mg total) by mouth daily.   magnesium 30 MG tablet Take 30  mg by mouth 2 (two) times daily.   methocarbamol (ROBAXIN) 500 MG tablet Take 1 tablet (500 mg total) by mouth every 6 (six) hours as needed for muscle spasms.   nitroGLYCERIN (NITROSTAT) 0.4 MG SL tablet Place 1 tablet (0.4 mg total) under the tongue every 5 (five) minutes as needed for up to 25 days for chest pain.   ondansetron (ZOFRAN) 4 MG tablet Take by mouth.   potassium chloride SA (KLOR-CON M20) 20 MEQ tablet Take 1 tablet (20 mEq total) by mouth daily.   Probiotic Product (ALIGN PO) Take by mouth.   propranolol (INDERAL) 20 MG tablet Take 1 tablet (20 mg total) by mouth 3 (three) times daily.   rosuvastatin (CRESTOR) 10 MG tablet TAKE 1 TABLET BY MOUTH EVERY DAY   sodium chloride (OCEAN) 0.65 % SOLN nasal spray Place 1 spray into both nostrils as needed for congestion.   VITAMIN D PO Take 1 capsule by mouth every other day. In the morning    Social  History: Social History   Tobacco Use   Smoking status: Never   Smokeless tobacco: Never  Vaping Use   Vaping status: Never Used  Substance Use Topics   Alcohol use: No   Drug use: No    Family Medical History: Family History  Problem Relation Age of Onset   Cardiomyopathy Mother    Coronary artery disease Father    Hypertension Brother    Prostate cancer Brother     Physical Examination: Vitals:   08/31/23 0613 08/31/23 0620  BP: (!) 156/62   Pulse: 69   Resp: 18   Temp:  98.1 F (36.7 C)  SpO2: 100%     General: Patient is in no apparent distress. Attention to examination is appropriate.  Neck:   Supple.  Full range of motion.  Respiratory: Patient is breathing without any difficulty.   NEUROLOGICAL:     Awake, alert, oriented to person, place, and time.  Speech is clear and fluent.  Cranial Nerves: Pupils equal round and reactive to light.  Facial tone is symmetric.  Facial sensation is symmetric. Shoulder shrug is symmetric. Tongue protrusion is midline.   Strength: MUE well   Side Iliopsoas Quads Hamstring PF DF EHL  R 5 5 5 5 5 5   L 5 5 5 5 5 5    Bilateral upper and lower extremity sensation is intact to light touch.    No evidence of dysmetria noted.  Gait is untested.     Medical Decision Making  Imaging: MRI L spine 08/30/2023 IMPRESSION: 1. Acute superior endplate compression fracture at T12 with mild loss of vertebral body height and mild osseous retropulsion. 2. Subacute inferior endplate compression deformity at L1 has mildly progressed from previous abdominal radiographs and CT. 3. No significant change in subacute superior endplate compression deformity at L2 with mild osseous retropulsion. 4. Multilevel spondylosis as described. There is resulting mild to moderate multifactorial spinal stenosis at L2-3 and mild spinal stenosis at L3-4 and L4-5. 5. Moderate asymmetric narrowing of the right lateral recess and right foramen at  L4-5.     Electronically Signed   By: Carey Bullocks M.D.   On: 08/30/2023 18:05  I have personally reviewed the images and agree with the above interpretation.  Assessment and Plan: Amanda Lambert is a pleasant 77 y.o. female with new T12 compression fracture above a prior L2 compression fracture.  Given her worsening pain, we discussed referral for possible kyphoplasty.  She will continue to wear  brace.  After kyphoplasty, she is safe to mobilize with her brace.  Will keep her follow-up as scheduled.     I have communicated my recommendations to the requesting physician and coordinated care to facilitate these recommendations.     Trevaughn Schear K. Myer Haff MD, [facility] Neurosurgery

## 2023-08-31 NOTE — Hospital Course (Signed)
17XW with h/o CAD s/p stent with chronic angina, HFpEF, B carotid disease, HTN, HLD, and stage 3a CKD who presented on 12/5 with worsening back pain.  She was seen for this issue on 11/13 and diagnosed with a compression fracture; she saw neurosurgery as an outpatient but her pain worsened despite LSO brace.  Imaging showed known L2-3 compression fractures as well as new T12 and L1 fractures that are thought to be due to severe osteoporosis.  Neurosurgery and IR were consulted, with plan for kyphoplasty.

## 2023-09-01 DIAGNOSIS — S32000A Wedge compression fracture of unspecified lumbar vertebra, initial encounter for closed fracture: Secondary | ICD-10-CM | POA: Diagnosis not present

## 2023-09-01 LAB — BASIC METABOLIC PANEL
Anion gap: 8 (ref 5–15)
BUN: 12 mg/dL (ref 8–23)
CO2: 27 mmol/L (ref 22–32)
Calcium: 8.5 mg/dL — ABNORMAL LOW (ref 8.9–10.3)
Chloride: 103 mmol/L (ref 98–111)
Creatinine, Ser: 0.89 mg/dL (ref 0.44–1.00)
GFR, Estimated: >60 mL/min (ref >60)
Glucose, Bld: 95 mg/dL (ref 70–99)
Potassium: 3.3 mmol/L — ABNORMAL LOW (ref 3.5–5.1)
Sodium: 138 mmol/L (ref 135–145)

## 2023-09-01 MED ORDER — POTASSIUM CHLORIDE CRYS ER 20 MEQ PO TBCR
40.0000 meq | EXTENDED_RELEASE_TABLET | Freq: Once | ORAL | Status: AC
  Administered 2023-09-01: 40 meq via ORAL
  Filled 2023-09-01: qty 2

## 2023-09-01 NOTE — Progress Notes (Signed)
Progress Note   Patient: Amanda Lambert UUV:[id number] DOB: [birth year] DOA: 08/30/2023     2 DOS: the patient was seen and examined on 09/01/2023   Brief hospital course: 77yo with h/o CAD s/p stent with chronic angina, HFpEF, B carotid disease, HTN, HLD, and stage 3a CKD who presented on 12/5 with worsening back pain.  She was seen for this issue on 11/13 and diagnosed with a compression fracture; she saw neurosurgery as an outpatient but her pain worsened despite LSO brace.  Imaging showed known L2-3 compression fractures as well as new T12 and L1 fractures that are thought to be due to severe osteoporosis.  Neurosurgery and IR were consulted, with plan for kyphoplasty.    Assessment and Plan:  Lumbar compression fracture  Patient is presenting with known L2 and L3 compression fracture, now with new T12 and L1 Suspect this is due to severe osteoporosis - she reports taking bisphosphonate therapy for maybe 1-2 years very remotely  Neurosurgery consulted, recommended LSO brace and IR consult IR consulted; she has been approved for T12, L2, L2 kyphoplasty/vertebroplasty and will need a 5 day hold of ASA due to high bleeding risk This procedure can be performed in-house on 12/11 if she remains hospitalized or will be arranged as an outpatient if she discharges sooner  Pain control with scheduled Tylenol, lidocaine, Robaxin, and calcitonin nasal spray, in addition to as needed oxycodone and fentanyl Zofran as needed for nausea Continue gabapentin, methocarbamol Discussed continued need for outpatient workup of suspected osteoporosis, however DEXA scan may need to be delayed   Chronic diastolic CHF (congestive heart failure)  04/2021 echo with preserved EF and grade 1 diastolic dysfunction Patient appears essentially euvolemic on examination with the exception of trace pitting edema Continue home Lasix   Coronary artery disease of native artery of native heart with stable angina pectoris No chest  pain reported at this time  Continue home Imdur, propranolol, and rosuvastatin Hold home aspirin in anticipation of procedure.  Will need to restart as soon as possible afterwards   HTN (hypertension) Continue home propranolol   GERD Continue PPI  Hypokalemia Repleted Recheck BMP in AM   DNR Confirmed on admission She will need a gold out of facility DNR form at the time of discharge      Consultants: Neurosurgery IR (telephone only) PT OT   Procedures: None   Antibiotics: None  30 Day Unplanned Readmission Risk Score    Flowsheet Row ED to Hosp-Admission (Current) from 08/30/2023 in [facility] (1A)  30 Day Unplanned Readmission Risk Score (%) 18.35 Filed at 09/01/2023 0400       This score is the patient's risk of an unplanned readmission within 30 days of being discharged (0 -100%). The score is based on dignosis, age, lab data, medications, orders, and past utilization.   Low:  0-14.9   Medium: 15-21.9   High: 22-29.9   Extreme: 30 and above           Subjective: Required IV pain medication x 1 overnight, does not feel safe to discharge at this time.   Objective: Vitals:   09/01/23 0001 09/01/23 0756  BP: (!) 147/73 (!) 157/69  Pulse: 63 62  Resp: 20 16  Temp: 98 F (36.7 C) 98.8 F (37.1 C)  SpO2: 96% 99%    Intake/Output Summary (Last 24 hours) at 09/01/2023 1416 Last data filed at 08/31/2023 1900 Gross per 24 hour  Intake 240 ml  Output --  Net 240 ml   Filed Weights   08/30/23 1053 09/01/23 0500  Weight: 78.5 kg 79 kg    Exam:  General:  Appears calm and comfortable and is in NAD, resting with LSO in place Eyes:  EOMI, normal lids, iris ENT:  grossly normal hearing, lips & tongue, mmm Neck:  no LAD, masses or thyromegaly Cardiovascular:  RRR, no m/r/g. No LE edema.  Respiratory:   CTA bilaterally with no wheezes/rales/rhonchi.  Normal respiratory effort. Abdomen:  soft, NT, ND Skin:  no rash or  induration seen on limited exam Musculoskeletal:  grossly normal tone BUE/BLE, good ROM, no bony abnormality Psychiatric:  blunted mood and affect, speech fluent and appropriate, AOx3 Neurologic:  CN 2-12 grossly intact, moves all extremities in coordinated fashion  Data Reviewed: I have reviewed the patient's lab results since admission.  Pertinent labs for today include:   K+ 3.3     Family Communication: None present today  Disposition: Status is: Inpatient Remains inpatient appropriate because: ongoing pain management     Time spent: 35 minutes  Unresulted Labs (From admission, onward)     Start     Ordered   09/02/23 0500  Basic metabolic panel  Tomorrow morning,   R        09/01/23 1415   09/02/23 0500  CBC with Differential/Platelet  Tomorrow morning,   R        09/01/23 1415   08/31/23 0500  CBC with Differential/Platelet  Tomorrow morning,   R        08/30/23 1709             Author: Jonah Blue, MD 09/01/2023 2:16 PM  For on call review [email or web address].

## 2023-09-01 NOTE — Plan of Care (Signed)

## 2023-09-01 NOTE — Evaluation (Signed)
Occupational Therapy Evaluation Patient Details Name: Amanda Lambert MRN: [id number] DOB: 01/13/1946 Today's Date: 09/01/2023   History of Present Illness Pt is a 77 yo F presenting with back pain due to new T12 and L1 compression fractures, as well as known L2-L3 compression fractures (08/08/23). Current MD assessment also includes chronic CHF, CAD with stable angina, HTN, GERD, and hypokalemia. As of 09/01/2023, making plans for lumbar kyphoplasty surgery.   Clinical Impression   Pt motivated, provides detailed history, A&Ox4. PTA, pt independent including driving. Pt currently requires setup for UB seated ADLs, minA for LB ADLs and minA-CGA for functional transfers with RW. Demonstrated log roll technique for return to supine with minA for B LE mgmt. Pt would benefit from skilled OT services to address noted impairments and functional limitations (see below for any additional details) in order to maximize safety and independence while minimizing falls risk and caregiver burden. Anticipate the need for follow up OT services upon acute hospital DC.       If plan is discharge home, recommend the following: A little help with walking and/or transfers;A little help with bathing/dressing/bathroom;Assistance with cooking/housework;Assist for transportation;Help with stairs or ramp for entrance    Functional Status Assessment  Patient has had a recent decline in their functional status and demonstrates the ability to make significant improvements in function in a reasonable and predictable amount of time.  Equipment Recommendations  None recommended by OT (pt has necessary DME)    Recommendations for Other Services       Precautions / Restrictions Precautions Precautions: Back Required Braces or Orthoses: Spinal Brace Spinal Brace: Lumbar corset;Other (comment) (on at all times) Restrictions Weight Bearing Restrictions: No      Mobility Bed Mobility Overal bed mobility: Needs  Assistance Bed Mobility: Rolling, Sit to Sidelying Rolling: Contact guard assist Sidelying to sit: Min assist, Contact guard assist       General bed mobility comments: min assist with BLE, pt reporting discomfor with log roll    Transfers Overall transfer level: Needs assistance Equipment used: Rolling walker (2 wheels) Transfers: Sit to/from Stand, Bed to chair/wheelchair/BSC Sit to Stand: Min assist, Contact guard assist     Step pivot transfers: Contact guard assist     General transfer comment: minA progressing to CGA      Balance Overall balance assessment: Needs assistance Sitting-balance support: No upper extremity supported, Feet supported Sitting balance-Leahy Scale: Good     Standing balance support: Bilateral upper extremity supported, No upper extremity supported, During functional activity Standing balance-Leahy Scale: Fair                             ADL either performed or assessed with clinical judgement   ADL Overall ADL's : Needs assistance/impaired Eating/Feeding: Set up;Sitting   Grooming: Set up;Sitting   Upper Body Bathing: Sitting;Set up   Lower Body Bathing: Minimal assistance;Cueing for back precautions;With adaptive equipment;Sitting/lateral leans   Upper Body Dressing : Sitting;Set up   Lower Body Dressing: Minimal assistance;Cueing for back precautions;Sitting/lateral leans   Toilet Transfer: Minimal assistance;Contact guard assist;BSC/3in1;Rollator (4 wheels);Cueing for sequencing Toilet Transfer Details (indicate cue type and reason): simulated chair to recliner Toileting- Clothing Manipulation and Hygiene: Sitting/lateral lean;Cueing for back precautions;Adhering to back precautions;Minimal assistance       Functional mobility during ADLs: Contact guard assist;Minimal assistance;Rolling walker (2 wheels);Cueing for sequencing General ADL Comments: Discussed techniques (figure four) for LB dressing, back precautions for  comfort,  and transfers with log roll for bed mobility     Vision Baseline Vision/History: 1 Wears glasses Ability to See in Adequate Light: 0 Adequate Patient Visual Report: No change from baseline Vision Assessment?: Wears glasses for reading            Pertinent Vitals/Pain Pain Assessment Pain Assessment: 0-10 Pain Score: 4  Pain Location: low back Pain Descriptors / Indicators: Aching, Discomfort, Dull, Grimacing Pain Intervention(s): Limited activity within patient's tolerance, Monitored during session, Repositioned     Extremity/Trunk Assessment Upper Extremity Assessment Upper Extremity Assessment: Defer to OT evaluation;Overall WFL for tasks assessed   Lower Extremity Assessment Lower Extremity Assessment: Generalized weakness   Cervical / Trunk Assessment Cervical / Trunk Assessment: Other exceptions (in LSO)   Communication Communication Communication: No apparent difficulties Cueing Techniques: Verbal cues;Tactile cues;Visual cues   Cognition Arousal: Alert Behavior During Therapy: WFL for tasks assessed/performed Overall Cognitive Status: Within Functional Limits for tasks assessed                                          Exercises Other Exercises Other Exercises: LSO donned, educated on role/purpose of OT        Home Living Family/patient expects to be discharged to:: Private residence Living Arrangements: Alone Available Help at Discharge: Family;Available PRN/intermittently (fami) Type of Home: House Home Access: Stairs to enter [employer] of Steps: 3 (all entrances have stairs) Entrance Stairs-Rails: Can reach both Home Layout: Able to live on main level with bedroom/bathroom Alternate Level Stairs-Number of Steps: flight   Bathroom Shower/Tub: Tub/shower unit;Walk-in shower   Bathroom Toilet: Handicapped height Bathroom Accessibility: Yes How Accessible: Accessible via walker Home Equipment: Rolling Walker (2  wheels);Cane - single point;Grab bars - toilet;Grab bars - tub/shower;Hand held shower head;Shower seat;BSC/3in1   Additional Comments: very large house with multiple entrances, all with stairs      Prior Functioning/Environment Prior Level of Function : Independent/Modified Independent;Driving             Mobility Comments: independent, but reports she ADLs Comments: independent        OT Problem List: Decreased range of motion;Decreased activity tolerance;Decreased strength;Impaired balance (sitting and/or standing);Decreased knowledge of use of DME or AE;Decreased knowledge of precautions;Pain      OT Treatment/Interventions: Self-care/ADL training;Therapeutic exercise;Neuromuscular education;Energy conservation;DME and/or AE instruction;Therapeutic activities;Patient/family education;Balance training    OT Goals(Current goals can be found in the care plan section) Acute Rehab OT Goals OT Goal Formulation: With patient Time For Goal Achievement: 09/15/23 Potential to Achieve Goals: Good  OT Frequency: Min 1X/week       AM-PAC OT "6 Clicks" Daily Activity     Outcome Measure Help from another person eating meals?: None Help from another person taking care of personal grooming?: None Help from another person toileting, which includes using toliet, bedpan, or urinal?: A Lot Help from another person bathing (including washing, rinsing, drying)?: A Lot Help from another person to put on and taking off regular upper body clothing?: A Lot Help from another person to put on and taking off regular lower body clothing?: A Lot 6 Click Score: 16   End of Session Equipment Utilized During Treatment: Rolling walker (2 wheels);Gait belt;Back brace Nurse Communication: Mobility status  Activity Tolerance: Patient tolerated treatment well Patient left: in bed;with call bell/phone within reach;with bed alarm set;with nursing/sitter in room  OT Visit Diagnosis: Unsteadiness on  feet  (R26.81);Other abnormalities of gait and mobility (R26.89);Pain Pain - Right/Left:  (back) Pain - part of body:  (back)                Time: 2536-6440 OT Time Calculation (min): 30 min Charges:  OT General Charges $OT Visit: 1 Visit OT Evaluation $OT Eval Low Complexity: 1 Low OT Treatments $Self Care/Home Management : 8-22 mins Shealyn Sean L. Annaleigha Woo, OTR/L  09/01/23, 4:26 PM

## 2023-09-01 NOTE — Evaluation (Addendum)
Physical Therapy Evaluation Patient Details Name: Amanda Lambert MRN: [id number] DOB: July 27, 1946 Today's Date: 09/01/2023  History of Present Illness  Pt is a 77 yo F presenting with back pain due to new T12 and L1 compression fractures, as well as known L2-L3 compression fractures (08/08/23). Current MD assessment also includes chronic CHF, CAD with stable angina, HTN, GERD, and hypokalemia. As of 09/01/2023, making plans for lumbar kyphoplasty surgery.  Clinical Impression  Pt was pleasant and motivated to participate during the session and put forth good effort throughout. Pt received in bed, A,O x4, and able to give detailed history. Pt completed bed mobility with Superv for log roll cuing, needing extra time but exhibiting no real difficulty. Pt completed transfers with Superv, utilizing cuing for proper mechanics and extra time. Pt amb in hallway with RW and Superv, demonstrating safe gait pattern with slightly diminished speed. Pt also completed stair training with CGA for safety, exhibiting steadiness throughout and good implementation of efficient sequencing with slight RLE weakness. Pt's vitals were monitored during session and remained WNL on RA, pt reporting some low back pain and moderate fatigue after mobility, but no other adverse symptoms throughout. Pt will benefit from continued PT services upon discharge to safely address deficits listed in patient problem list for decreased caregiver assistance and eventual return to PLOF.          If plan is discharge home, recommend the following: Assist for transportation;Help with stairs or ramp for entrance;Assistance with cooking/housework   Can travel by private vehicle        Equipment Recommendations None recommended by PT  Recommendations for Other Services       Functional Status Assessment Patient has had a recent decline in their functional status and demonstrates the ability to make significant improvements in function in a  reasonable and predictable amount of time.     Precautions / Restrictions Precautions Precautions: Fall;Back Required Braces or Orthoses: Spinal Brace Spinal Brace: Lumbar corset Restrictions Weight Bearing Restrictions: No      Mobility  Bed Mobility Overal bed mobility: Needs Assistance Bed Mobility: Rolling, Sidelying to Sit Rolling: Supervision Sidelying to sit: Supervision, HOB elevated       General bed mobility comments: needed extra time and cuing, Superv for safety but very capable, good understanding of log roll technique    Transfers Overall transfer level: Needs assistance Equipment used: Rolling walker (2 wheels) Transfers: Sit to/from Stand Sit to Stand: Supervision           General transfer comment: self-selected 1 hand on RW and 1 hand on bed, extra time to complete but steady throughout, fair-good BLE strength, some difficulty 2/2 pain, exhibiting fairly good sequencing with cues to utilize forward lean from hips for safety/decreased pain    Ambulation/Gait Ambulation/Gait assistance: Supervision Gait Distance (Feet): 150 Feet Assistive device: Rolling walker (2 wheels) Gait Pattern/deviations: Step-through pattern, Decreased stride length, Decreased step length - right, Decreased step length - left Gait velocity: decreased     General Gait Details: diminished step lengths showing small increase with distance, consistent cadence throughout with slight asymmetrical gait due to R hip discomfort (bursitis) - mild R limp, but steady with mininal BUE use on RW, minimal cuing for RW management and posture, reports of moderate fatigue after total distance  Stairs Stairs: Yes Stairs assistance: Contact guard assist Stair Management: One rail Left, Step to pattern, Sideways Number of Stairs: 4 General stair comments: steady and safe with self-selected sequencing - reported she  has been utilizing this method at home; fairly quick step-to ascent/descent,  mild unsteadiness but no LOBs, observed mild weakness/instability in RLE, pt confirming and therefore cuing given for optimal sequencing given weaker RLE, cuing for hand placement  Wheelchair Mobility     Tilt Bed    Modified Rankin (Stroke Patients Only)       Balance Overall balance assessment: Needs assistance Sitting-balance support: Feet supported, No upper extremity supported Sitting balance-Leahy Scale: Good     Standing balance support: Bilateral upper extremity supported, No upper extremity supported, During functional activity Standing balance-Leahy Scale: Fair Standing balance comment: able to pick things up from counter without AD in static stand with very little unsteadiness, no overt LOB during dynamic gait activities with AD                             Pertinent Vitals/Pain Pain Assessment Pain Assessment: Faces Faces Pain Scale: Hurts little more Pain Location: low back Pain Descriptors / Indicators: Discomfort, Sore Pain Intervention(s): Monitored during session, Repositioned    Home Living Family/patient expects to be discharged to:: Private residence Living Arrangements: Alone Available Help at Discharge: Family;Friend(s);Available 24 hours/day (son and daughter n law next door, brother/granddaughter/church friends close by) Type of Home: House Home Access: Stairs to enter Entrance Stairs-Rails: Left Entrance Stairs-Number of Steps: 3 Alternate Level Stairs-Number of Steps: flight Home Layout: Two level;Able to live on main level with bedroom/bathroom Home Equipment: Rolling Walker (2 wheels);Shower seat;Grab bars - tub/shower;Grab bars - toilet      Prior Function Prior Level of Function : Independent/Modified Independent             Mobility Comments: pt reported mod I community ambulator with intermittent RW use recently due to pain/weakness; reports often doing too much and family telling her to slow down ADLs Comments: ind with  all ADLs prior to lumbar fractures     Extremity/Trunk Assessment   Upper Extremity Assessment Upper Extremity Assessment: Defer to OT evaluation;Overall WFL for tasks assessed    Lower Extremity Assessment Lower Extremity Assessment: Generalized weakness    Cervical / Trunk Assessment Cervical / Trunk Assessment: Other exceptions (in LSO)  Communication   Communication Communication: No apparent difficulties Cueing Techniques: Verbal cues;Tactile cues;Visual cues  Cognition Arousal: Alert Behavior During Therapy: WFL for tasks assessed/performed Overall Cognitive Status: Within Functional Limits for tasks assessed                                 General Comments: easily distracted by own thoughts/stories        General Comments      Exercises Other Exercises Other Exercises: pt educated on proper log roll sequencing, BLT precautions, wearing of LSO brace for safety, and proper hip hinge mechanics for improved mobility efficiency   Assessment/Plan    PT Assessment Patient needs continued PT services  PT Problem List Decreased strength;Decreased mobility;Decreased range of motion;Decreased balance;Decreased knowledge of use of DME;Decreased activity tolerance;Decreased coordination;Decreased safety awareness;Pain       PT Treatment Interventions DME instruction;Therapeutic exercise;Balance training;Gait training;Stair training;Functional mobility training;Therapeutic activities;Patient/family education    PT Goals (Current goals can be found in the Care Plan section)  Acute Rehab PT Goals Patient Stated Goal: regain independence PT Goal Formulation: With patient Time For Goal Achievement: 09/14/23 Potential to Achieve Goals: Good    Frequency Min 1X/week  Co-evaluation               AM-PAC PT "6 Clicks" Mobility  Outcome Measure Help needed turning from your back to your side while in a flat bed without using bedrails?: None Help  needed moving from lying on your back to sitting on the side of a flat bed without using bedrails?: A Little Help needed moving to and from a bed to a chair (including a wheelchair)?: A Little Help needed standing up from a chair using your arms (e.g., wheelchair or bedside chair)?: A Little Help needed to walk in hospital room?: A Little Help needed climbing 3-5 steps with a railing? : A Little 6 Click Score: 19    End of Session Equipment Utilized During Treatment: Gait belt;Back brace Activity Tolerance: Patient tolerated treatment well;Other (comment) (mod fatigue at end of session) Patient left: in chair;Other (comment) (OT handoff) Nurse Communication: Mobility status PT Visit Diagnosis: Unsteadiness on feet (R26.81);Muscle weakness (generalized) (M62.81);Pain Pain - part of body:  (low back)    Time: 1610-9604 PT Time Calculation (min) (ACUTE ONLY): 37 min   Charges:            Rosiland Oz SPT 09/01/23, 4:33 PM This entire session was performed under direct supervision and direction of a licensed therapist/therapist assistant. I have personally read, edited and approve of the note as written.  Loran Senters, DPT

## 2023-09-01 NOTE — Plan of Care (Signed)

## 2023-09-02 DIAGNOSIS — S32000A Wedge compression fracture of unspecified lumbar vertebra, initial encounter for closed fracture: Secondary | ICD-10-CM | POA: Diagnosis not present

## 2023-09-02 LAB — CBC WITH DIFFERENTIAL/PLATELET
Abs Immature Granulocytes: 0.02 10*3/uL (ref 0.00–0.07)
Basophils Absolute: 0.1 10*3/uL (ref 0.0–0.1)
Basophils Relative: 1 %
Eosinophils Absolute: 0.3 10*3/uL (ref 0.0–0.5)
Eosinophils Relative: 6 %
HCT: 32.7 % — ABNORMAL LOW (ref 36.0–46.0)
Hemoglobin: 11 g/dL — ABNORMAL LOW (ref 12.0–15.0)
Immature Granulocytes: 0 %
Lymphocytes Relative: 27 %
Lymphs Abs: 1.4 10*3/uL (ref 0.7–4.0)
MCH: 29.6 pg (ref 26.0–34.0)
MCHC: 33.6 g/dL (ref 30.0–36.0)
MCV: 87.9 fL (ref 80.0–100.0)
Monocytes Absolute: 0.4 10*3/uL (ref 0.1–1.0)
Monocytes Relative: 8 %
Neutro Abs: 2.9 10*3/uL (ref 1.7–7.7)
Neutrophils Relative %: 58 %
Platelets: 248 10*3/uL (ref 150–400)
RBC: 3.72 MIL/uL — ABNORMAL LOW (ref 3.87–5.11)
RDW: 12.2 % (ref 11.5–15.5)
WBC: 5 10*3/uL (ref 4.0–10.5)
nRBC: 0 % (ref 0.0–0.2)

## 2023-09-02 LAB — BASIC METABOLIC PANEL
Anion gap: 7 (ref 5–15)
BUN: 11 mg/dL (ref 8–23)
CO2: 26 mmol/L (ref 22–32)
Calcium: 8.6 mg/dL — ABNORMAL LOW (ref 8.9–10.3)
Chloride: 103 mmol/L (ref 98–111)
Creatinine, Ser: 0.98 mg/dL (ref 0.44–1.00)
GFR, Estimated: 60 mL/min — ABNORMAL LOW (ref >60)
Glucose, Bld: 101 mg/dL — ABNORMAL HIGH (ref 70–99)
Potassium: 3.6 mmol/L (ref 3.5–5.1)
Sodium: 136 mmol/L (ref 135–145)

## 2023-09-02 MED ORDER — LIDOCAINE 5 % EX PTCH: 1.0000 | MEDICATED_PATCH | CUTANEOUS | 0 refills | Status: DC

## 2023-09-02 MED ORDER — CALCITONIN (SALMON) 200 UNIT/ACT NA SOLN: 1.0000 | Freq: Every day | NASAL | 12 refills | Status: DC

## 2023-09-02 MED ORDER — OXYCODONE HCL 5 MG PO TABS: 5.0000 mg | ORAL_TABLET | Freq: Four times a day (QID) | ORAL | 0 refills | Status: DC | PRN

## 2023-09-02 NOTE — Plan of Care (Signed)

## 2023-09-02 NOTE — TOC Initial Note (Signed)
Transition of Care [facility]) - Initial/Assessment Note    Patient Details  Name: Amanda Lambert MRN: [id number] Date of Birth: 1945/11/19  Transition of Care [facility]) CM/SW Contact:    Liliana Cline, LCSW Phone Number: 09/02/2023, 10:50 AM  Clinical Narrative:                 CSW spoke with patient regarding Home Health recommendations.  Patient states she is from home alone, her son lives nearby and is supportive. She also has friends who are supportive. Patient is able to drive at baseline. PCP is [place]. Pharmacy is [facility]. Patient has a RW, shower chair, bathroom grab bars, cane, and bedside commode.  Patient is agreeable to [facility], however states she is scheduled to come back for a surgery on Wednesday and wants to wait to have Home Health set up until after her Surgery.   Expected Discharge Plan: Home/Self Care Barriers to Discharge: Barriers Resolved   Patient Goals and CMS Choice Patient states their goals for this hospitalization and ongoing recovery are:: coming back for surgery Wednesday CMS Medicare.gov Compare Post Acute Care list provided to:: Patient Choice offered to / list presented to : Patient      Expected Discharge Plan and Services       Living arrangements for the past 2 months: Single Family Home                                      Prior Living Arrangements/Services Living arrangements for the past 2 months: Single Family Home Lives with:: Self Patient language and need for interpreter reviewed:: Yes Do you feel safe going back to the place where you live?: Yes      Need for Family Participation in Patient Care: Yes (Comment) Care giver support system in place?: Yes (comment) Current home services: DME Criminal Activity/Legal Involvement Pertinent to Current Situation/Hospitalization: No - Comment as needed  Activities of Daily Living   ADL Screening (condition at time of admission) Independently performs ADLs?: Yes  (appropriate for developmental age) Is the patient deaf or have difficulty hearing?: No Does the patient have difficulty seeing, even when wearing glasses/contacts?: No Does the patient have difficulty concentrating, remembering, or making decisions?: No  Permission Sought/Granted Permission sought to share information with : Facility Industrial/product designer granted to share information with : Yes, Verbal Permission Granted     Permission granted to share info w AGENCY: HH and DME agencies as needed        Emotional Assessment       Orientation: : Oriented to Self, Oriented to Place, Oriented to  Time, Oriented to Situation Alcohol / Substance Use: Not Applicable Psych Involvement: No (comment)  Admission diagnosis:  Lumbar compression fracture (HCC) [S32.000A] Intractable pain [R52] Compression fracture of L2 lumbar vertebra, open, initial encounter (HCC) [S32.020B] Patient Active Problem List   Diagnosis Date Noted   Compression fracture of L2 lumbar vertebra, open, initial encounter (HCC) 08/31/2023   Compression fracture of T12 vertebra (HCC) 08/31/2023   Lumbar compression fracture (HCC) 08/30/2023   CHF (congestive heart failure) (HCC) 04/17/2023   Coronary artery disease of native artery of native heart with stable angina pectoris (HCC) 07/04/2021   Hypercholesteremia 07/04/2021   LAD stenosis 06/13/2021   Angina pectoris (HCC) 11/03/2019   Biliary dyskinesia 04/09/2012   Transaminitis 04/01/2012   Lower urinary tract infectious disease 04/01/2012  HTN (hypertension) 04/01/2012   GERD (gastroesophageal reflux disease) 04/01/2012   PCP:  Collene Mares, [place] [phone] [address] [place] - [address] [place] [address] Phone: [phone] Fax: [phone]     Social Determinants of Health (SDOH) Social History: SDOH Screenings   Food Insecurity: No Food Insecurity (08/31/2023)  Housing: Low Risk   (08/31/2023)  Transportation Needs: No Transportation Needs (08/31/2023)  Utilities: Not At Risk (08/31/2023)  Tobacco Use: Low Risk  (08/30/2023)   SDOH Interventions:     Readmission Risk Interventions    09/02/2023   10:49 AM  Readmission Risk Prevention Plan  Transportation Screening Complete  PCP or Specialist Appt within 5-7 Days Complete  Home Care Screening Complete  Medication Review (RN CM) Complete

## 2023-09-02 NOTE — Discharge Summary (Signed)
Physician Discharge Summary   Patient: Amanda Lambert MRN: [id number] DOB: [birth year]  Admit date:     08/30/2023  Discharge date: 09/02/23  Discharge Physician: Jonah Blue   PCP: Collene Mares, [place]   Recommendations at discharge:   You have been prescribed home PT/OT Continue to hold aspirin until after procedure Wear LSO brace IR will consult you with time for kyphoplasty You are being given narcotic pain medication; do not drive or make important decisions while taking this medication Discuss osteoporosis therapy with PCP Follow up with PA Hyacinth Meeker in 1-2 weeks Follow up with neurosurgery as scheduled  Discharge Diagnoses: Principal Problem:   Lumbar compression fracture (HCC) Active Problems:   HTN (hypertension)   Coronary artery disease of native artery of native heart with stable angina pectoris (HCC)   CHF (congestive heart failure) (HCC)   Compression fracture of L2 lumbar vertebra, open, initial encounter (HCC)   Compression fracture of T12 vertebra [facility])    Hospital Course: 77yo with h/o CAD s/p stent with chronic angina, HFpEF, B carotid disease, HTN, HLD, and stage 3a CKD who presented on 12/5 with worsening back pain.  She was seen for this issue on 11/13 and diagnosed with a compression fracture; she saw neurosurgery as an outpatient but her pain worsened despite LSO brace.  Imaging showed known L2-3 compression fractures as well as new T12 and L1 fractures that are thought to be due to severe osteoporosis.  Neurosurgery and IR were consulted, with plan for kyphoplasty.    Assessment and Plan:  Lumbar compression fracture  Patient is presenting with known L2 and L3 compression fracture, now with new T12 and L1 Suspect this is due to severe osteoporosis - she reports taking bisphosphonate therapy for maybe 1-2 years very remotely Neurosurgery consulted, recommended LSO brace and IR consult IR consulted; she has been approved for T12, L2, L2  kyphoplasty/vertebroplasty and will need a 5 day hold of ASA due to high bleeding risk This procedure can be performed in-house on 12/11 if she remains hospitalized or will be arranged as an outpatient if she discharges sooner  Pain control with scheduled Tylenol, lidocaine, Robaxin, and calcitonin nasal spray, in addition to as needed oxycodone and fentanyl Zofran as needed for nausea Continue gabapentin, methocarbamol Discussed continued need for outpatient workup of suspected osteoporosis, however DEXA scan may need to be delayed   Chronic diastolic CHF (congestive heart failure)  04/2021 echo with preserved EF and grade 1 diastolic dysfunction Patient appears essentially euvolemic on examination with the exception of trace pitting edema Continue home Lasix   Coronary artery disease of native artery of native heart with stable angina pectoris No chest pain reported at this time  Continue home Imdur, propranolol, and rosuvastatin Hold home aspirin in anticipation of procedure.  Will need to restart as soon as possible afterwards   HTN (hypertension) Continue home propranolol   GERD Continue PPI   Hypokalemia Repleted   DNR Confirmed on admission She will need a gold out of facility DNR form and MOST form completed by PCP     Consultants: Neurosurgery IR (telephone only) PT OT   Procedures: None   Antibiotics: None      Pain control - [place] Controlled Substance Reporting System database was reviewed. and patient was instructed, not to drive, operate heavy machinery, perform activities at heights, swimming or participation in water activities or provide baby-sitting services while on Pain, Sleep and Anxiety Medications; until their outpatient Physician has advised to  do so again. Also recommended to not to take more than prescribed Pain, Sleep and Anxiety Medications.   Disposition: Home Diet recommendation:  Regular diet DISCHARGE MEDICATION: Allergies as  of 09/02/2023       Reactions   Ace Inhibitors Anaphylaxis   Angioedema.   Bee Venom Anaphylaxis, Other (See Comments)   Thorazine [chlorpromazine] Anaphylaxis   Wasp Venom Anaphylaxis   Levaquin [levofloxacin In D5w] Nausea And Vomiting   Stated by patient she could not handle levaquin even with antiemetics    Shrimp [shellfish Allergy] Rash, Hives   "splotches"    Amlodipine    severe leg edema   Atropine Sulfate Other (See Comments)   Elemental Sulfur Itching      Glycopyrrolate Other (See Comments)   Ramipril Other (See Comments)   Sulfa Antibiotics Other (See Comments)   Compazine [prochlorperazine Edisylate] Anxiety   Latex Rash        Medication List     STOP taking these medications    aspirin EC 81 MG tablet       TAKE these medications    ALIGN PO Take by mouth.   BENADRYL PO Take by mouth as needed.   calcitonin (salmon) 200 UNIT/ACT nasal spray Commonly known as: MIACALCIN/FORTICAL Place 1 spray into alternate nostrils daily. Start taking on: September 03, 2023   EPINEPHrine 0.3 mg/0.3 mL Soaj injection Commonly known as: EPI-PEN Inject 0.3 mg into the muscle once as needed for anaphylaxis.   esomeprazole 40 MG capsule Commonly known as: NEXIUM Take 40 mg by mouth in the morning.   estradiol 0.1 MG/GM vaginal cream Commonly known as: ESTRACE Place 1 g vaginally 3 (three) times a week.   furosemide 20 MG tablet Commonly known as: Lasix Take 1 tablet (20 mg total) by mouth as needed.   gabapentin 300 MG capsule Commonly known as: NEURONTIN Take 300 mg by mouth 2 (two) times daily.   isosorbide mononitrate 120 MG 24 hr tablet Commonly known as: IMDUR Take 1 tablet (120 mg total) by mouth daily.   LIBRAX PO Take by mouth as needed. Before meals bid as needed   lidocaine 5 % Commonly known as: LIDODERM Place 1 patch onto the skin daily. Remove & Discard patch within 12 hours or as directed by MD   magnesium 30 MG tablet Take 30 mg  by mouth 2 (two) times daily.   methocarbamol 500 MG tablet Commonly known as: ROBAXIN Take 1 tablet (500 mg total) by mouth every 6 (six) hours as needed for muscle spasms.   nitroGLYCERIN 0.4 MG SL tablet Commonly known as: NITROSTAT Place 1 tablet (0.4 mg total) under the tongue every 5 (five) minutes as needed for up to 25 days for chest pain.   ondansetron 4 MG tablet Commonly known as: ZOFRAN Take by mouth.   oxyCODONE 5 MG immediate release tablet Commonly known as: Oxy IR/ROXICODONE Take 1 tablet (5 mg total) by mouth every 6 (six) hours as needed for moderate pain (pain score 4-6).   potassium chloride SA 20 MEQ tablet Commonly known as: Klor-Con M20 Take 1 tablet (20 mEq total) by mouth daily.   propranolol 20 MG tablet Commonly known as: INDERAL Take 1 tablet (20 mg total) by mouth 3 (three) times daily.   rosuvastatin 10 MG tablet Commonly known as: CRESTOR TAKE 1 TABLET BY MOUTH EVERY DAY   sodium chloride 0.65 % Soln nasal spray Commonly known as: OCEAN Place 1 spray into both nostrils as needed for congestion.  VITAMIN B-12 PO Take 1 tablet by mouth in the morning.   VITAMIN D PO Take 1 capsule by mouth every other day. In the morning        Discharge Exam:   Subjective: Did well with therapy yesterday but she is concerned that she overdid it and whether she will be successful at home.  After getting up and walking some today, she feels confident in going home.   Objective: Vitals:   09/01/23 2352 09/02/23 0835  BP: 95/72 (!) 165/70  Pulse: (!) 58 70  Resp: 18 18  Temp: 98.1 F (36.7 C) 98.1 F (36.7 C)  SpO2: 96% 97%    Intake/Output Summary (Last 24 hours) at 09/02/2023 1150 Last data filed at 09/01/2023 1400 Gross per 24 hour  Intake 480 ml  Output --  Net 480 ml   Filed Weights   08/30/23 1053 09/01/23 0500 09/02/23 0500  Weight: 78.5 kg 79 kg 79.1 kg    Exam:  General:  Appears calm and comfortable and is in NAD, sitting  up on the side of the bed Eyes:  EOMI, normal lids, iris ENT:  grossly normal hearing, lips & tongue, mmm Neck:  no LAD, masses or thyromegaly Cardiovascular:  RRR, no m/r/g. No LE edema.  Respiratory:   CTA bilaterally with no wheezes/rales/rhonchi.  Normal respiratory effort. Abdomen:  soft, NT, ND Skin:  no rash or induration seen on limited exam Musculoskeletal:  grossly normal tone BUE/BLE, good ROM, no bony abnormality Psychiatric:  blunted mood and affect, speech fluent and appropriate, AOx3 Neurologic:  CN 2-12 grossly intact, moves all extremities in coordinated fashion  Data Reviewed: I have reviewed the patient's lab results since admission.  Pertinent labs for today include:  Unremarkable BMP WBC 5 Hgb 11     Condition at discharge: improving  The results of significant diagnostics from this hospitalization (including imaging, microbiology, ancillary and laboratory) are listed below for reference.   Imaging Studies: DG Lumbar Spine 2-3 Views  Result Date: 08/30/2023 CLINICAL DATA:  Back pain.  L2 fracture. EXAM: LUMBAR SPINE - 2-3 VIEW COMPARISON:  Abdominal radiographs 08/06/2023. Abdominopelvic CT 08/08/2023. FINDINGS: There are 5 lumbar type vertebral bodies demonstrating a mild convex left scoliosis and a grade 1 anterolisthesis at L4-5. Subacute superior endplate compression deformity at L2 has not significantly progressed from the recent abdominal CT. There is a mild inferior endplate compression deformity at L1 which has mildly progressed. No other fractures are demonstrated. Facet degenerative changes are present inferiorly. Moderate stool throughout the colon. IMPRESSION: 1. Subacute superior endplate compression deformity at L2 has not significantly progressed from the recent abdominal CT. 2. Mild inferior endplate compression deformity at L1 has mildly progressed. Electronically Signed   By: Carey Bullocks M.D.   On: 08/30/2023 18:08   MR LUMBAR SPINE WO  CONTRAST  Result Date: 08/30/2023 CLINICAL DATA:  Increased back pain for the past few days. Lumbar compression deformities. EXAM: MRI LUMBAR SPINE WITHOUT CONTRAST TECHNIQUE: Multiplanar, multisequence MR imaging of the lumbar spine was performed. No intravenous contrast was administered. COMPARISON:  CT lumbar spine 08/30/2023, lumbar radiographs 08/21/2023 and abdominopelvic CT 08/08/2023. FINDINGS: Segmentation: Conventional anatomy assumed, with the last open disc space designated L5-S1.Concordant with prior imaging. Alignment:  Stable grade 1 anterolisthesis at L4-5. Vertebrae: As shown on earlier radiographs, there is an acute superior endplate compression fracture at T12 resulting in approximately 20% loss of vertebral body height and 6 mm of osseous retropulsion. Subacute inferior endplate compression deformity at  L1 has mildly progressed from previous abdominal radiographs and CT. No significant change in subacute fracture involving the superior endplate of L2 with 3 mm of osseous retropulsion. There is mild marrow edema associated with each of these fractures. No evidence of pars defect. Conus medullaris: Extends to the L1 level and appears normal. Paraspinal and other soft tissues: No significant paraspinal hematoma or other significant paraspinal findings. Disc levels: T11-12: Preserved disc height and hydration. Osseous retropulsion contributes to minimal mass effect on the thecal sac. No cord deformity or significant foraminal narrowing. T12-L1: No significant findings. L1-2: Stable mild disc bulging and bilateral facet hypertrophy. Mild osseous retropulsion. Mild foraminal narrowing bilaterally without nerve root encroachment. L2-3: Mild loss of disc height with disc bulging, endplate osteophytes and bilateral facet hypertrophy. Resulting mild to moderate multifactorial spinal stenosis with mild lateral recess and foraminal narrowing bilaterally. L3-4: Mild disc bulging, facet and ligamentous  hypertrophy. Mild spinal stenosis with minimal lateral recess and foraminal narrowing bilaterally. L4-5: Loss of disc height with annular disc bulging and uncovering. Moderate facet hypertrophy accounts for the grade 1 anterolisthesis and is worse on the right. Resulting mild spinal stenosis with asymmetric moderate narrowing of the right lateral recess and right foramen. L5-S1: Preserved disc height and hydration. Mild disc bulging and mild bilateral facet hypertrophy. No spinal stenosis or lateral recess narrowing. Mild left foraminal narrowing. IMPRESSION: 1. Acute superior endplate compression fracture at T12 with mild loss of vertebral body height and mild osseous retropulsion. 2. Subacute inferior endplate compression deformity at L1 has mildly progressed from previous abdominal radiographs and CT. 3. No significant change in subacute superior endplate compression deformity at L2 with mild osseous retropulsion. 4. Multilevel spondylosis as described. There is resulting mild to moderate multifactorial spinal stenosis at L2-3 and mild spinal stenosis at L3-4 and L4-5. 5. Moderate asymmetric narrowing of the right lateral recess and right foramen at L4-5. Electronically Signed   By: Carey Bullocks M.D.   On: 08/30/2023 18:05   CT Lumbar Spine Wo Contrast  Result Date: 08/30/2023 CLINICAL DATA:  Provided history: Compression fracture, lumbar. Spine fracture, lumbar, traumatic. EXAM: CT LUMBAR SPINE WITHOUT CONTRAST TECHNIQUE: Multidetector CT imaging of the lumbar spine was performed without intravenous contrast administration. Multiplanar CT image reconstructions were also generated. RADIATION DOSE REDUCTION: This exam was performed according to the departmental dose-optimization program which includes automated exposure control, adjustment of the mA and/or kV according to patient size and/or use of iterative reconstruction technique. COMPARISON:  Lumbar spine radiographs 08/21/2023. CT abdomen/pelvis  08/08/2023. CT abdomen/pelvis 04/03/2023. FINDINGS: Segmentation: 5 lumbar vertebrae. The caudal most well-formed tubal disc space is designated L5-S1. Alignment: Levocurvature of lumbar spine. Bony retropulsion at the T12 and L2 levels as described below. 5 mm L4-L5 grade 1 anterolisthesis. Vertebrae: Acute T12 superior endplate vertebral compression fracture (25% height loss), new from the prior lumbar spine radiographs of 08/21/2023. 4 mm bony retropulsion at the level of the T12 superior endplate. Acute L1 inferior endplate vertebral compression fracture (25% vertebral body height loss has progressed from the prior lumbar spine radiographs. L2 superior endplate vertebral compression fracture (40% height loss), unchanged from the prior CT abdomen/pelvis of 08/08/2023. 3 mm bony retropulsion at the level of the L2 superior endplate, also unchanged. L3 superior endplate vertebral compression fracture with superimposed Schmorl node (40% height loss), unchanged from the prior CT abdomen/pelvis of 08/08/2023. Paraspinal and other soft tissues: No acute finding within included portions of the abdomen/retroperitoneum. Aortoiliac atherosclerosis. Disc levels: Multilevel disc space narrowing,  greatest at L2-L3 (mild to moderate at this level). T11-T12: 4 mm bony retropulsion at the level of the T12 superior endplate. No more than mild spinal canal narrowing is appreciated. No significant neural foraminal narrowing. T12-L1: No significant disc herniation or stenosis appreciated. L1-L2: 3 mm bony retropulsion at the level of the L2 superior endplate. Disc bulge. Mild spinal canal narrowing. Mild bilateral neural foraminal narrowing. L2-L3: Disc bulge. Facet arthropathy. Ligamentum flavum hypertrophy. Bilateral subarticular stenosis. Suspected moderate central canal stenosis. Mild bilateral neural foraminal narrowing. L3-L4: Disc bulge. Facet arthropathy. Apparent mild bilateral subarticular and central canal narrowing. Mild  bilateral neural foraminal narrowing. L4-L5: 5 mm grade 1 anterolisthesis. Disc uncovering with disc bulge. Facet arthropathy (advanced right, moderate left). Bilateral subarticular stenosis. Suspected moderate central canal stenosis. Bilateral neural foraminal narrowing (moderate/severe right, moderate left). L5-S1: No significant disc herniation or spinal canal stenosis is appreciated. Facet arthropathy (greater on the left). No significant spinal canal stenosis. Mild-to-moderate left neural foraminal narrowing. IMPRESSION: 1. Acute T12 superior endplate vertebral compression fracture (25% height loss), new from the prior lumbar spine radiographs of 08/21/2023. 4 mm bony retropulsion at the level of the T12 superior endplate resulting in mild spinal canal narrowing. 2. Acute L1 inferior endplate vertebral compression fracture. 25% vertebral body height loss has progressed from the prior lumbar spine radiographs of 08/21/2023. 3. L2 superior endplate vertebral compression fracture (40% height loss), unchanged from the prior CT abdomen/pelvis of 08/08/2023. 3 mm bony retropulsion at the level of the L2 superior endplate resulting in mild spinal canal narrowing. 4. L3 superior endplate vertebral compression fracture with superimposed Schmorl node (40% height loss), unchanged from the prior CT abdomen/pelvis of 08/08/2023. 5. Lumbar spondylosis as outlined within the body of the report. 6. Levocurvature of the lumbar spine. 7. 5 mm L4-L5 facet mediated grade 1 anterolisthesis. 8.  Aortic Atherosclerosis (ICD10-I70.0). Electronically Signed   By: Jackey Loge D.O.   On: 08/30/2023 14:11   CT ABDOMEN PELVIS W CONTRAST  Result Date: 08/08/2023 CLINICAL DATA:  Abdominal/flank pain, stone suspected Dysuria, concern for pyelonephritis EXAM: CT ABDOMEN AND PELVIS WITH CONTRAST TECHNIQUE: Multidetector CT imaging of the abdomen and pelvis was performed using the standard protocol following bolus administration of  intravenous contrast. RADIATION DOSE REDUCTION: This exam was performed according to the departmental dose-optimization program which includes automated exposure control, adjustment of the mA and/or kV according to patient size and/or use of iterative reconstruction technique. CONTRAST:  OMNIPAQUE IOHEXOL 300 MG/ML  SOLN COMPARISON:  CT abdomen/pelvis dated April 03, 2023 FINDINGS: Lower chest: No acute abnormality. Hepatobiliary: No focal liver abnormality is seen. Status post cholecystectomy. Unchanged mild prominence of the common bile duct measuring up to 8 mm is likely secondary to post cholecystectomy state. No intrahepatic biliary ductal dilatation. Pancreas: Unremarkable. No pancreatic ductal dilatation or surrounding inflammatory changes. Spleen: Normal in size without focal abnormality. Adrenals/Urinary Tract: The adrenal glands are unremarkable. The kidneys enhance symmetrically. Mild left renal cortical scarring. No suspicious focal lesions. No renal or ureteral calculi. No hydronephrosis. The bladder is partially distended and otherwise grossly unremarkable. Stomach/Bowel: Stomach is within normal limits. Status post appendectomy. No evidence of bowel wall thickening, distention, or focal inflammatory changes. Sigmoid colonic diverticulosis without evidence of acute diverticulitis. Vascular/Lymphatic: Aortic atherosclerosis. No enlarged abdominal or pelvic lymph nodes. Reproductive: Uterus and bilateral adnexa are unremarkable. 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 No abdominal wall hernia visualized. No abdominopelvic ascites. No intraperitoneal free air.  Musculoskeletal: There is a superior endplate compression deformity of the L2 vertebral body demonstrating approximately 40% height loss (series 6, image 65), which appears new compared to the prior exam dated July 2024. No significant osseous retropulsion. Multilevel degenerative changes of  the visualized thoracolumbar spine with similar grade 1 anterolisthesis of L4 on L5. No suspicious osseous lesion. IMPRESSION: 1. Superior endplate compression deformity of the L2 vertebral body demonstrating approximately 40% height loss appears new compared to the prior exam dated July 2024. No significant osseous retropulsion. Recommend correlation with history and physical exam. 2. No evidence of renal or ureteral calculi or hydronephrosis. 3. 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 No abdominal wall hernia visualized. 4.  Aortic Atherosclerosis (ICD10-I70.0). 5. Additional unchanged ancillary findings as described above. Electronically Signed   By: Hart Robinsons M.D.   On: 08/08/2023 13:06   DG Abdomen 1 View  Result Date: 08/06/2023 CLINICAL DATA:  Right flank pain with radiation for 3 weeks. EXAM: ABDOMEN - 1 VIEW COMPARISON:  CT 04/03/2023 with contrast. FINDINGS: Gas seen in nondilated loops small and large bowel. Scattered colonic stool. Vascular calcifications seen along the aorta and along vessels in the pelvis. Previous cholecystectomy. No obvious free air on these supine radiographs. No definite abnormal calcification seen overlying either kidney nor along the expected course of either ureter. The calcifications in the pelvis has a appearance likely vascular calcifications and corresponding to the finding by prior CT scan. There is significant gas and stool overlying the reniform shadows limit evaluation for subtle calcification. If there is further concern renal or ureteral stones and noncontrast CT could be considered as clinically appropriate IMPRESSION: Nonspecific bowel gas pattern with scattered stool. Diffuse vascular calcifications. No definite abnormal calcification overlying either kidney nor along the expected course of either ureter. Additional workup as clinically appropriate Electronically Signed   By: Karen Kays M.D.   On:  08/06/2023 16:03    Microbiology: Results for orders placed or performed during the hospital encounter of 04/20/23  Urine Culture     Status: None   Collection Time: 04/20/23  3:01 PM   Specimen: Urine, Clean Catch  Result Value Ref Range Status   Specimen Description URINE, CLEAN CATCH  Final   Special Requests NONE  Final   Culture   Final    NO GROWTH Performed at [facility], [address], [place], [place] [address]    Report Status 04/21/2023 FINAL  Final    Labs: CBC: Recent Labs  Lab 08/30/23 1321 09/02/23 0316  WBC 6.9 5.0  NEUTROABS 4.4 2.9  HGB 11.2* 11.0*  HCT 34.6* 32.7*  MCV 90.1 87.9  PLT 258 248   Basic Metabolic Panel: Recent Labs  Lab 08/30/23 1321 08/31/23 0648 09/01/23 0206 09/02/23 0316  NA 141 139 138 136  K 3.5 4.0 3.3* 3.6  CL 104 104 103 103  CO2 28 22 27 26   GLUCOSE 105* 87 95 101*  BUN 12 13 12 11   CREATININE 0.90 0.87 0.89 0.98  CALCIUM 9.0 9.4 8.5* 8.6*   Liver Function Tests: No results for input(s): "AST", "ALT", "ALKPHOS", "BILITOT", "PROT", "ALBUMIN" in the last 168 hours. CBG: No results for input(s): "GLUCAP" in the last 168 hours.  Discharge time spent: greater than 30 minutes.  Signed: Jonah Blue, MD [facility] 09/02/2023

## 2023-09-02 NOTE — Plan of Care (Signed)
Patient discharged per MD orders at this time.All discharge instructions, education and medications reviewed with the Pt. Patient expressed understanding and will comply with dc instructions.follow up appointments was also communicated to the Pt.no verbal c/o or any ssx of distress.Pt was discharged home with self care per order.Pt was transported home by her friend in a privately owned vehicle.

## 2023-09-03 ENCOUNTER — Other Ambulatory Visit: Payer: Self-pay | Admitting: Interventional Radiology

## 2023-09-03 ENCOUNTER — Encounter: Payer: Self-pay | Admitting: Interventional Radiology

## 2023-09-03 ENCOUNTER — Other Ambulatory Visit: Payer: Self-pay | Admitting: Physician Assistant

## 2023-09-03 DIAGNOSIS — S22080A Wedge compression fracture of T11-T12 vertebra, initial encounter for closed fracture: Secondary | ICD-10-CM

## 2023-09-03 DIAGNOSIS — M8088XA Other osteoporosis with current pathological fracture, vertebra(e), initial encounter for fracture: Secondary | ICD-10-CM

## 2023-09-03 DIAGNOSIS — S32020A Wedge compression fracture of second lumbar vertebra, initial encounter for closed fracture: Secondary | ICD-10-CM

## 2023-09-03 DIAGNOSIS — S32020B Wedge compression fracture of second lumbar vertebra, initial encounter for open fracture: Secondary | ICD-10-CM

## 2023-09-03 DIAGNOSIS — S22000A Wedge compression fracture of unspecified thoracic vertebra, initial encounter for closed fracture: Secondary | ICD-10-CM

## 2023-09-04 ENCOUNTER — Other Ambulatory Visit (HOSPITAL_COMMUNITY): Payer: Self-pay | Admitting: Radiology

## 2023-09-04 ENCOUNTER — Other Ambulatory Visit (HOSPITAL_COMMUNITY): Payer: Self-pay

## 2023-09-04 ENCOUNTER — Telehealth (HOSPITAL_COMMUNITY): Payer: Self-pay | Admitting: Pharmacy Technician

## 2023-09-04 DIAGNOSIS — S32000A Wedge compression fracture of unspecified lumbar vertebra, initial encounter for closed fracture: Secondary | ICD-10-CM

## 2023-09-04 NOTE — Telephone Encounter (Signed)
Pharmacy Patient Advocate Encounter   Received notification from Fax that prior authorization for Lidocaine 5% patches is required/requested.   Insurance verification completed.   The patient is insured through [facility] .   Per test claim: PA required; PA submitted to above mentioned insurance via CoverMyMeds Key/confirmation #/EOC [id number] Status is pending

## 2023-09-04 NOTE — Progress Notes (Signed)
Patient for IR T12, L1, and L2 KP on Wed 09/05/2023, I called and spoke with the patient's son, Meredith Staggers on the phone and gave pre-procedure instructions. Wes was made aware to have the patient here at 12p, NPO after MN prior to procedure as well as driver post procedure/recovery/discharge. Wes stated understanding.  Called 09/03/2023  Pt was advised by the hospitalist when she was d/c to discontinue her ASA 81mg  and Wes said that she was not taking it currently when I spoke with him.  Zollie Scale -[place] [facility]- reported that the patient's insurance did not require any pre-auth - 09/03/2023

## 2023-09-04 NOTE — Telephone Encounter (Signed)
Pharmacy Patient Advocate Encounter  Received notification from [facility] that Prior Authorization for Lidocaine 5% patches  has been DENIED.  Full denial letter will be uploaded to the media tab. See denial reason below. We denied this request under Medicare Part D because Lidocaine 5% patch is not being prescribed for an FDA labeled or medically accepted use. A medically accepted use is approved by the FDA or supported by the [facility] Formulary Service Drug Information and the DRUGDEX Information System. In this case, Lidocaine 5% patch is not being prescribed in accordance with an FDA labeled use or use accepted by the Medicare approved drug compendia.  PA #/Case ID/Reference #: [id number]

## 2023-09-05 ENCOUNTER — Other Ambulatory Visit: Payer: Self-pay | Admitting: Interventional Radiology

## 2023-09-05 ENCOUNTER — Other Ambulatory Visit: Payer: Self-pay

## 2023-09-05 ENCOUNTER — Ambulatory Visit
Admission: RE | Admit: 2023-09-05 | Discharge: 2023-09-05 | Disposition: A | Discharge status: Home / Self Care | Payer: Medicare Other | Source: Ambulatory Visit | Attending: Interventional Radiology | Admitting: Interventional Radiology

## 2023-09-05 ENCOUNTER — Encounter: Payer: Self-pay | Admitting: Radiology

## 2023-09-05 DIAGNOSIS — K5730 Diverticulosis of large intestine without perforation or abscess without bleeding: Secondary | ICD-10-CM | POA: Insufficient documentation

## 2023-09-05 DIAGNOSIS — S22080A Wedge compression fracture of T11-T12 vertebra, initial encounter for closed fracture: Secondary | ICD-10-CM

## 2023-09-05 DIAGNOSIS — M8088XA Other osteoporosis with current pathological fracture, vertebra(e), initial encounter for fracture: Secondary | ICD-10-CM | POA: Insufficient documentation

## 2023-09-05 DIAGNOSIS — S32020B Wedge compression fracture of second lumbar vertebra, initial encounter for open fracture: Secondary | ICD-10-CM | POA: Diagnosis not present

## 2023-09-05 DIAGNOSIS — X58XXXA Exposure to other specified factors, initial encounter: Secondary | ICD-10-CM | POA: Insufficient documentation

## 2023-09-05 DIAGNOSIS — M4855XA Collapsed vertebra, not elsewhere classified, thoracolumbar region, initial encounter for fracture: Secondary | ICD-10-CM | POA: Diagnosis not present

## 2023-09-05 DIAGNOSIS — S32000A Wedge compression fracture of unspecified lumbar vertebra, initial encounter for closed fracture: Secondary | ICD-10-CM

## 2023-09-05 HISTORY — PX: IR KYPHO LUMBAR INC FX REDUCE BONE BX UNI/BIL CANNULATION INC/IMAGING: IMG5519

## 2023-09-05 HISTORY — PX: IR KYPHO EA ADDL LEVEL THORACIC OR LUMBAR: IMG5520

## 2023-09-05 HISTORY — PX: IR KYPHO THORACIC WITH BONE BIOPSY: IMG5518

## 2023-09-05 LAB — CBC WITH DIFFERENTIAL/PLATELET
Abs Immature Granulocytes: 0.02 10*3/uL (ref 0.00–0.07)
Basophils Absolute: 0.1 10*3/uL (ref 0.0–0.1)
Basophils Relative: 1 %
Eosinophils Absolute: 0.1 10*3/uL (ref 0.0–0.5)
Eosinophils Relative: 2 %
HCT: 36.8 % (ref 36.0–46.0)
Hemoglobin: 12.1 g/dL (ref 12.0–15.0)
Immature Granulocytes: 0 %
Lymphocytes Relative: 26 %
Lymphs Abs: 1.6 10*3/uL (ref 0.7–4.0)
MCH: 29.1 pg (ref 26.0–34.0)
MCHC: 32.9 g/dL (ref 30.0–36.0)
MCV: 88.5 fL (ref 80.0–100.0)
Monocytes Absolute: 0.3 10*3/uL (ref 0.1–1.0)
Monocytes Relative: 5 %
Neutro Abs: 3.9 10*3/uL (ref 1.7–7.7)
Neutrophils Relative %: 66 %
Platelets: 270 10*3/uL (ref 150–400)
RBC: 4.16 MIL/uL (ref 3.87–5.11)
RDW: 12.2 % (ref 11.5–15.5)
WBC: 5.9 10*3/uL (ref 4.0–10.5)
nRBC: 0 % (ref 0.0–0.2)

## 2023-09-05 LAB — BASIC METABOLIC PANEL
Anion gap: 10 (ref 5–15)
BUN: 10 mg/dL (ref 8–23)
CO2: 26 mmol/L (ref 22–32)
Calcium: 9.5 mg/dL (ref 8.9–10.3)
Chloride: 102 mmol/L (ref 98–111)
Creatinine, Ser: 0.85 mg/dL (ref 0.44–1.00)
GFR, Estimated: >60 mL/min (ref >60)
Glucose, Bld: 115 mg/dL — ABNORMAL HIGH (ref 70–99)
Potassium: 3.6 mmol/L (ref 3.5–5.1)
Sodium: 138 mmol/L (ref 135–145)

## 2023-09-05 LAB — PROTIME-INR
INR: 1 (ref 0.8–1.2)
Prothrombin Time: 13.7 s (ref 11.4–15.2)

## 2023-09-05 MED ORDER — MIDAZOLAM HCL 2 MG/2ML IJ SOLN
INTRAMUSCULAR | Status: AC
Start: 2023-09-05 — End: ?
  Filled 2023-09-05: qty 2

## 2023-09-05 MED ORDER — ONDANSETRON HCL 4 MG/2ML IJ SOLN
INTRAMUSCULAR | Status: AC
  Filled 2023-09-05: qty 4

## 2023-09-05 MED ORDER — OXYCODONE-ACETAMINOPHEN 5-325 MG PO TABS
ORAL_TABLET | ORAL | Status: AC
  Administered 2023-09-05: 2
  Filled 2023-09-05: qty 2

## 2023-09-05 MED ORDER — SODIUM CHLORIDE 0.9 % IV SOLN
8.0000 mg | Freq: Once | INTRAVENOUS | Status: DC
  Filled 2023-09-05: qty 4

## 2023-09-05 MED ORDER — MIDAZOLAM HCL 2 MG/2ML IJ SOLN
INTRAMUSCULAR | Status: AC
  Filled 2023-09-05: qty 4

## 2023-09-05 MED ORDER — ONDANSETRON 4 MG PO TBDP
4.0000 mg | ORAL_TABLET | Freq: Once | ORAL | Status: AC
  Administered 2023-09-05: 4 mg via ORAL
  Filled 2023-09-05: qty 1

## 2023-09-05 MED ORDER — SODIUM CHLORIDE 0.9 % IV SOLN: INTRAVENOUS | Status: DC

## 2023-09-05 MED ORDER — OXYCODONE-ACETAMINOPHEN 5-325 MG PO TABS
2.0000 | ORAL_TABLET | Freq: Once | ORAL | Status: AC
  Filled 2023-09-05: qty 2

## 2023-09-05 MED ORDER — FENTANYL CITRATE (PF) 100 MCG/2ML IJ SOLN
INTRAMUSCULAR | Status: AC
  Filled 2023-09-05: qty 2

## 2023-09-05 MED ORDER — MIDAZOLAM HCL 2 MG/2ML IJ SOLN
INTRAMUSCULAR | Status: AC | PRN
  Administered 2023-09-05: 1 mg via INTRAVENOUS
  Administered 2023-09-05: 2 mg via INTRAVENOUS
  Administered 2023-09-05 (×3): 1 mg via INTRAVENOUS

## 2023-09-05 MED ORDER — LIDOCAINE HCL (PF) 1 % IJ SOLN
INTRAMUSCULAR | Status: AC
  Filled 2023-09-05: qty 30

## 2023-09-05 MED ORDER — LIDOCAINE HCL (PF) 1 % IJ SOLN
30.0000 mL | Freq: Once | INTRAMUSCULAR | Status: AC
  Administered 2023-09-05: 30 mL via INTRADERMAL

## 2023-09-05 MED ORDER — ONDANSETRON 4 MG PO TBDP
ORAL_TABLET | ORAL | Status: AC
  Filled 2023-09-05: qty 1

## 2023-09-05 MED ORDER — FENTANYL CITRATE (PF) 100 MCG/2ML IJ SOLN
INTRAMUSCULAR | Status: AC | PRN
  Administered 2023-09-05 (×4): 25 ug via INTRAVENOUS
  Administered 2023-09-05: 50 ug via INTRAVENOUS
  Administered 2023-09-05 (×2): 25 ug via INTRAVENOUS

## 2023-09-05 MED ORDER — ONDANSETRON HCL 4 MG/2ML IJ SOLN
4.0000 mg | INTRAMUSCULAR | Status: DC | PRN
  Administered 2023-09-05: 8 mg via INTRAVENOUS

## 2023-09-05 MED ORDER — CEFAZOLIN SODIUM-DEXTROSE 2-4 GM/100ML-% IV SOLN
2.0000 g | Freq: Once | INTRAVENOUS | Status: AC
  Administered 2023-09-05: 2 g via INTRAVENOUS
  Filled 2023-09-05: qty 100

## 2023-09-05 MED ORDER — ONDANSETRON 4 MG PO TBDP: 4.0000 mg | ORAL_TABLET | Freq: Three times a day (TID) | ORAL | 0 refills | Status: DC | PRN

## 2023-09-05 MED ORDER — CEFAZOLIN SODIUM-DEXTROSE 2-4 GM/100ML-% IV SOLN
INTRAVENOUS | Status: AC
  Filled 2023-09-05: qty 100

## 2023-09-05 NOTE — Progress Notes (Signed)
Post medication, patient had improvement in pain and nausea subsided.  Patient able to get up and into wheelchair with some assistance, ambulate in bathroom and sit self down, void, stand and get back in wheelchair with little assistance and no increase in pain. Able to get into vehicle with little assistance.  Discharged home with son, Meredith Staggers.

## 2023-09-05 NOTE — H&P (Signed)
Chief Complaint: Patient was seen in consultation today for T12, L1, and L2 compression fractures  Referring Physician(s): Hassell,Daniel  Supervising Physician: Oley Balm  Patient Status: [facility] - Out-pt  History of Present Illness: Amanda Lambert is a 77 y.o. female with past medical history of CAD, CKD stage III, Hep B, THN, RMSF, GERD, LBBB s/p cardiac cath with stent placement who was recently admitted to [facility] 12/5 due to intractable back pain.  Patient was previously diagnosed with compression fractures 08/08/23 and was discharged home with conversative management however has continued to worsen.  IR was consulted for vertebroplasty and kyphoplasty.  This was approved however patient on aspirin due to cardiac history.  Decision made to discharge home to complete aspirin hold for outpatient procedure.   [name] presents to [facility] today in her usual state of health.  She has been NPO.  She does take aspirin but has held appropriately.  Continues to report inadequate pain relief with ongoing limitations on ADLs and mobility which is different from her baseline.   Her son is available for care and support at home.   Past Medical History:  Diagnosis Date   Anemia    Angina pectoris (HCC)    Aortic atherosclerosis (HCC)    Arthritis    Bilateral carotid artery disease (HCC) 10/15/2019   a.) carotid doppler 10/15/2019: 50-69% BICA; b.) carotid doppler 04/06/2020: 50-69% RICA, 1-15% LICA; c.) carotid doppler 11/03/2020 and 04/29/2021: 50-69% RICA, 16-49% LICA; d.) carotid doppler 03/02/2022: 50-69% BICA; e.) carotid doppler 03/14/2023: 50-69% RICA, 16-49% LICA   Biliary dyskinesia    CAD (coronary artery disease) 06/14/2021   a.) LHC 11/04/2019: 50% mLAD - med mgmt; b.) LHC/PCI 06/14/2021: 40% p-mLAD, 80% mLAD (3.0 x 30 mm Onyx Frontier DES; c.) LHC for FFR study: 07/19/2021: 40% pLAD; RFR 0.92, FFR 0.89, CFR 4.8, IMR 18 --> no significant macro/microvascular disease    CKD (chronic kidney disease), stage III (HCC)    Complication of anesthesia    a.) PONV; b.) delayed emergence   Constipation    Diastolic dysfunction 10/15/2019   a.) TTE 10/15/2019: EF 50-55%, norm LAP, mild LA dil, mod MR, mild-mod TR, G1DD; b.) TTE 05/19/2021: EF 50-55%, mild basal sep hypertrophy, norm LAP, triv MR/TR, G1DD   GERD (gastroesophageal reflux disease)    Hepatitis B 1975   Hypercholesteremia    Hypertension    Inguinal hernia    Irritable bowel syndrome without diarrhea    LBBB (left bundle branch block)    Leukopenia    Long term current use of aspirin    PONV (postoperative nausea and vomiting)    [facility] spotted fever 04/01/2012   adm. to hospital   Syncope 10/17/2019   Thrombocytopenia (HCC)    Transaminitis    Trochanteric bursitis, right hip    Vaginal Pap smear, abnormal    Varicose veins of leg with edema, bilateral    Weakness     Past Surgical History:  Procedure Laterality Date   APPENDECTOMY  09/25/1961   CHOLECYSTECTOMY  04/26/2012   Procedure: LAPAROSCOPIC CHOLECYSTECTOMY WITH INTRAOPERATIVE CHOLANGIOGRAM;  Surgeon: Robyne Askew, MD;  Location: [facility] ORS;  Service: General;  Laterality: N/A;   COLONOSCOPY     CORONARY ANGIOGRAPHY N/A 07/19/2021   Procedure: CORONARY ANGIOGRAPHY (CATH LAB);  Surgeon: Yates Decamp, MD;  Location: [facility] INVASIVE CV LAB;  Service: Cardiovascular;  Laterality: N/A;   CORONARY IMAGING/OCT N/A 06/14/2021   Procedure: INTRAVASCULAR IMAGING/OCT;  Surgeon: Yates Decamp, MD;  Location: [facility] INVASIVE CV LAB;  Service: Cardiovascular;  Laterality: N/A;   CORONARY PRESSURE/FFR STUDY N/A 07/19/2021   Procedure: INTRAVASCULAR PRESSURE WIRE/FFR STUDY;  Surgeon: Yates Decamp, MD;  Location: [facility] INVASIVE CV LAB;  Service: Cardiovascular;  Laterality: N/A;   CORONARY STENT INTERVENTION N/A 06/14/2021   Procedure: CORONARY STENT INTERVENTION;  Surgeon: Yates Decamp, MD;  Location: [facility] INVASIVE CV LAB;  Service: Cardiovascular;  Laterality:  N/A;   DILATION AND CURETTAGE OF UTERUS  09/25/1980   for miscarriage   DILATION AND CURETTAGE, DIAGNOSTIC / THERAPEUTIC  09/25/1970   INSERTION OF MESH Right 05/03/2023   Procedure: INSERTION OF MESH;  Surgeon: Sung Amabile, DO;  Location: [facility] ORS;  Service: General;  Laterality: Right;   LEFT HEART CATH AND CORONARY ANGIOGRAPHY N/A 11/04/2019   Procedure: LEFT HEART CATH AND CORONARY ANGIOGRAPHY;  Surgeon: Yates Decamp, MD;  Location: [facility] INVASIVE CV LAB;  Service: Cardiovascular;  Laterality: N/A;   LEFT HEART CATH AND CORONARY ANGIOGRAPHY N/A 06/14/2021   Procedure: LEFT HEART CATH AND CORONARY ANGIOGRAPHY;  Surgeon: Yates Decamp, MD;  Location: [facility] INVASIVE CV LAB;  Service: Cardiovascular;  Laterality: N/A;   TONSILLECTOMY  1964  - approximate   WRIST FRACTURE SURGERY  09/25/2006   right    Allergies: Ace inhibitors, Bee venom, Thorazine [chlorpromazine], Wasp venom, Levaquin [levofloxacin in d5w], Shrimp [shellfish allergy], Amlodipine, Atropine sulfate, Elemental sulfur, Glycopyrrolate, Ramipril, Sulfa antibiotics, Compazine [prochlorperazine edisylate], and Latex  Medications: Prior to Admission medications   Medication Sig Start Date End Date Taking? Authorizing Provider  calcitonin, salmon, (MIACALCIN/FORTICAL) 200 UNIT/ACT nasal spray Place 1 spray into alternate nostrils daily. 09/03/23   Jonah Blue, MD  chlordiazePOXIDE-Clidinium (LIBRAX PO) Take by mouth as needed. Before meals bid as needed    [provider]  Cyanocobalamin (VITAMIN B-12 PO) Take 1 tablet by mouth in the morning.    [provider]  diphenhydrAMINE HCl (BENADRYL PO) Take by mouth as needed.    [provider]  EPINEPHrine 0.3 mg/0.3 mL IJ SOAJ injection Inject 0.3 mg into the muscle once as needed for anaphylaxis.    [provider]  esomeprazole (NEXIUM) 40 MG capsule Take 40 mg by mouth in the morning.    [provider]  estradiol (ESTRACE) 0.1 MG/GM vaginal  cream Place 1 g vaginally 3 (three) times a week. 07/02/23   Milas Hock, MD  furosemide (LASIX) 20 MG tablet Take 1 tablet (20 mg total) by mouth as needed. 08/03/23   Jodelle Gross, NP  gabapentin (NEURONTIN) 300 MG capsule Take 300 mg by mouth 2 (two) times daily. 02/14/21   [provider]  isosorbide mononitrate (IMDUR) 120 MG 24 hr tablet Take 1 tablet (120 mg total) by mouth daily. 08/03/23   Jodelle Gross, NP  lidocaine (LIDODERM) 5 % Place 1 patch onto the skin daily. Remove & Discard patch within 12 hours or as directed by MD 09/02/23   Jonah Blue, MD  magnesium 30 MG tablet Take 30 mg by mouth 2 (two) times daily.    [provider]  methocarbamol (ROBAXIN) 500 MG tablet Take 1 tablet (500 mg total) by mouth every 6 (six) hours as needed for muscle spasms. 08/21/23   Venetia Night, MD  nitroGLYCERIN (NITROSTAT) 0.4 MG SL tablet Place 1 tablet (0.4 mg total) under the tongue every 5 (five) minutes as needed for up to 25 days for chest pain. 08/03/23 08/30/23  Jodelle Gross, NP  ondansetron (ZOFRAN) 4 MG tablet Take  by mouth. 04/04/23   [provider]  oxyCODONE (OXY IR/ROXICODONE) 5 MG immediate release tablet Take 1 tablet (5 mg total) by mouth every 6 (six) hours as needed for moderate pain (pain score 4-6). 09/02/23   Jonah Blue, MD  potassium chloride SA (KLOR-CON M20) 20 MEQ tablet Take 1 tablet (20 mEq total) by mouth daily. 08/03/23 11/01/23  Jodelle Gross, NP  Probiotic Product (ALIGN PO) Take by mouth.    [provider]  propranolol (INDERAL) 20 MG tablet Take 1 tablet (20 mg total) by mouth 3 (three) times daily. 07/16/23   Yates Decamp, MD  rosuvastatin (CRESTOR) 10 MG tablet TAKE 1 TABLET BY MOUTH EVERY DAY 06/29/23   Yates Decamp, MD  sodium chloride (OCEAN) 0.65 % SOLN nasal spray Place 1 spray into both nostrils as needed for congestion.    [provider]  VITAMIN D PO Take 1 capsule by mouth every other  day. In the morning    [provider]     Family History  Problem Relation Age of Onset   Cardiomyopathy Mother    Coronary artery disease Father    Hypertension Brother    Prostate cancer Brother     Social History   Socioeconomic History   Marital status: Widowed    Spouse name: Not on file   Number of children: 1   Years of education: Not on file   Highest education level: Not on file  Occupational History   Not on file  Tobacco Use   Smoking status: Never   Smokeless tobacco: Never  Vaping Use   Vaping status: Never Used  Substance and Sexual Activity   Alcohol use: No   Drug use: No   Sexual activity: Not Currently  Other Topics Concern   Not on file  Social History Narrative   Lives alone. Has small dog as pet.   Social Determinants of Health   Financial Resource Strain: Not on file  Food Insecurity: No Food Insecurity (08/31/2023)   Hunger Vital Sign    Worried About Running Out of Food in the Last Year: Never true    Ran Out of Food in the Last Year: Never true  Transportation Needs: No Transportation Needs (08/31/2023)   PRAPARE - Administrator, Civil Service (Medical): No    Lack of Transportation (Non-Medical): No  Physical Activity: Not on file  Stress: Not on file  Social Connections: Not on file     Review of Systems: A 12 point ROS discussed and pertinent positives are indicated in the HPI above.  All other systems are negative.  Review of Systems  Constitutional:  Negative for fatigue and fever.  Respiratory:  Negative for cough and shortness of breath.   Cardiovascular:  Negative for chest pain.  Gastrointestinal:  Negative for abdominal pain, nausea and vomiting.  Musculoskeletal:  Negative for back pain.  Psychiatric/Behavioral:  Negative for behavioral problems and confusion.     Vital Signs: BP (!) 155/115   Pulse 93   Temp 98.4 F (36.9 C) (Oral)   Resp 14   Ht 5\' 6"  (1.676 m)   Wt 175 lb (79.4 kg)   SpO2  97%   BMI 28.25 kg/m   Physical Exam Vitals and nursing note reviewed.  Constitutional:      General: She is not in acute distress.    Appearance: Normal appearance. She is not ill-appearing.  HENT:     Mouth/Throat:     Mouth:  Mucous membranes are moist.     Pharynx: Oropharynx is clear.  Cardiovascular:     Rate and Rhythm: Normal rate and regular rhythm.  Pulmonary:     Effort: Pulmonary effort is normal. No respiratory distress.     Breath sounds: Normal breath sounds.  Abdominal:     General: Abdomen is flat. There is no distension.     Palpations: Abdomen is soft.  Skin:    General: Skin is warm and dry.  Neurological:     General: No focal deficit present.     Mental Status: She is alert and oriented to person, place, and time. Mental status is at baseline.  Psychiatric:        Mood and Affect: Mood normal.        Behavior: Behavior normal.        Thought Content: Thought content normal.        Judgment: Judgment normal.      MD Evaluation Airway: WNL Heart: WNL Abdomen: WNL Chest/ Lungs: WNL ASA  Classification: 3 Mallampati/Airway Score: Two   Imaging: DG Lumbar Spine 2-3 Views  Result Date: 08/30/2023 CLINICAL DATA:  Back pain.  L2 fracture. EXAM: LUMBAR SPINE - 2-3 VIEW COMPARISON:  Abdominal radiographs 08/06/2023. Abdominopelvic CT 08/08/2023. FINDINGS: There are 5 lumbar type vertebral bodies demonstrating a mild convex left scoliosis and a grade 1 anterolisthesis at L4-5. Subacute superior endplate compression deformity at L2 has not significantly progressed from the recent abdominal CT. There is a mild inferior endplate compression deformity at L1 which has mildly progressed. No other fractures are demonstrated. Facet degenerative changes are present inferiorly. Moderate stool throughout the colon. IMPRESSION: 1. Subacute superior endplate compression deformity at L2 has not significantly progressed from the recent abdominal CT. 2. Mild inferior endplate  compression deformity at L1 has mildly progressed. Electronically Signed   By: Carey Bullocks M.D.   On: 08/30/2023 18:08   MR LUMBAR SPINE WO CONTRAST  Result Date: 08/30/2023 CLINICAL DATA:  Increased back pain for the past few days. Lumbar compression deformities. EXAM: MRI LUMBAR SPINE WITHOUT CONTRAST TECHNIQUE: Multiplanar, multisequence MR imaging of the lumbar spine was performed. No intravenous contrast was administered. COMPARISON:  CT lumbar spine 08/30/2023, lumbar radiographs 08/21/2023 and abdominopelvic CT 08/08/2023. FINDINGS: Segmentation: Conventional anatomy assumed, with the last open disc space designated L5-S1.Concordant with prior imaging. Alignment:  Stable grade 1 anterolisthesis at L4-5. Vertebrae: As shown on earlier radiographs, there is an acute superior endplate compression fracture at T12 resulting in approximately 20% loss of vertebral body height and 6 mm of osseous retropulsion. Subacute inferior endplate compression deformity at L1 has mildly progressed from previous abdominal radiographs and CT. No significant change in subacute fracture involving the superior endplate of L2 with 3 mm of osseous retropulsion. There is mild marrow edema associated with each of these fractures. No evidence of pars defect. Conus medullaris: Extends to the L1 level and appears normal. Paraspinal and other soft tissues: No significant paraspinal hematoma or other significant paraspinal findings. Disc levels: T11-12: Preserved disc height and hydration. Osseous retropulsion contributes to minimal mass effect on the thecal sac. No cord deformity or significant foraminal narrowing. T12-L1: No significant findings. L1-2: Stable mild disc bulging and bilateral facet hypertrophy. Mild osseous retropulsion. Mild foraminal narrowing bilaterally without nerve root encroachment. L2-3: Mild loss of disc height with disc bulging, endplate osteophytes and bilateral facet hypertrophy. Resulting mild to moderate  multifactorial spinal stenosis with mild lateral recess and foraminal narrowing bilaterally. L3-4: Mild  disc bulging, facet and ligamentous hypertrophy. Mild spinal stenosis with minimal lateral recess and foraminal narrowing bilaterally. L4-5: Loss of disc height with annular disc bulging and uncovering. Moderate facet hypertrophy accounts for the grade 1 anterolisthesis and is worse on the right. Resulting mild spinal stenosis with asymmetric moderate narrowing of the right lateral recess and right foramen. L5-S1: Preserved disc height and hydration. Mild disc bulging and mild bilateral facet hypertrophy. No spinal stenosis or lateral recess narrowing. Mild left foraminal narrowing. IMPRESSION: 1. Acute superior endplate compression fracture at T12 with mild loss of vertebral body height and mild osseous retropulsion. 2. Subacute inferior endplate compression deformity at L1 has mildly progressed from previous abdominal radiographs and CT. 3. No significant change in subacute superior endplate compression deformity at L2 with mild osseous retropulsion. 4. Multilevel spondylosis as described. There is resulting mild to moderate multifactorial spinal stenosis at L2-3 and mild spinal stenosis at L3-4 and L4-5. 5. Moderate asymmetric narrowing of the right lateral recess and right foramen at L4-5. Electronically Signed   By: Carey Bullocks M.D.   On: 08/30/2023 18:05   CT Lumbar Spine Wo Contrast  Result Date: 08/30/2023 CLINICAL DATA:  Provided history: Compression fracture, lumbar. Spine fracture, lumbar, traumatic. EXAM: CT LUMBAR SPINE WITHOUT CONTRAST TECHNIQUE: Multidetector CT imaging of the lumbar spine was performed without intravenous contrast administration. Multiplanar CT image reconstructions were also generated. RADIATION DOSE REDUCTION: This exam was performed according to the departmental dose-optimization program which includes automated exposure control, adjustment of the mA and/or kV according  to patient size and/or use of iterative reconstruction technique. COMPARISON:  Lumbar spine radiographs 08/21/2023. CT abdomen/pelvis 08/08/2023. CT abdomen/pelvis 04/03/2023. FINDINGS: Segmentation: 5 lumbar vertebrae. The caudal most well-formed tubal disc space is designated L5-S1. Alignment: Levocurvature of lumbar spine. Bony retropulsion at the T12 and L2 levels as described below. 5 mm L4-L5 grade 1 anterolisthesis. Vertebrae: Acute T12 superior endplate vertebral compression fracture (25% height loss), new from the prior lumbar spine radiographs of 08/21/2023. 4 mm bony retropulsion at the level of the T12 superior endplate. Acute L1 inferior endplate vertebral compression fracture (25% vertebral body height loss has progressed from the prior lumbar spine radiographs. L2 superior endplate vertebral compression fracture (40% height loss), unchanged from the prior CT abdomen/pelvis of 08/08/2023. 3 mm bony retropulsion at the level of the L2 superior endplate, also unchanged. L3 superior endplate vertebral compression fracture with superimposed Schmorl node (40% height loss), unchanged from the prior CT abdomen/pelvis of 08/08/2023. Paraspinal and other soft tissues: No acute finding within included portions of the abdomen/retroperitoneum. Aortoiliac atherosclerosis. Disc levels: Multilevel disc space narrowing, greatest at L2-L3 (mild to moderate at this level). T11-T12: 4 mm bony retropulsion at the level of the T12 superior endplate. No more than mild spinal canal narrowing is appreciated. No significant neural foraminal narrowing. T12-L1: No significant disc herniation or stenosis appreciated. L1-L2: 3 mm bony retropulsion at the level of the L2 superior endplate. Disc bulge. Mild spinal canal narrowing. Mild bilateral neural foraminal narrowing. L2-L3: Disc bulge. Facet arthropathy. Ligamentum flavum hypertrophy. Bilateral subarticular stenosis. Suspected moderate central canal stenosis. Mild bilateral  neural foraminal narrowing. L3-L4: Disc bulge. Facet arthropathy. Apparent mild bilateral subarticular and central canal narrowing. Mild bilateral neural foraminal narrowing. L4-L5: 5 mm grade 1 anterolisthesis. Disc uncovering with disc bulge. Facet arthropathy (advanced right, moderate left). Bilateral subarticular stenosis. Suspected moderate central canal stenosis. Bilateral neural foraminal narrowing (moderate/severe right, moderate left). L5-S1: No significant disc herniation or spinal canal stenosis is  appreciated. Facet arthropathy (greater on the left). No significant spinal canal stenosis. Mild-to-moderate left neural foraminal narrowing. IMPRESSION: 1. Acute T12 superior endplate vertebral compression fracture (25% height loss), new from the prior lumbar spine radiographs of 08/21/2023. 4 mm bony retropulsion at the level of the T12 superior endplate resulting in mild spinal canal narrowing. 2. Acute L1 inferior endplate vertebral compression fracture. 25% vertebral body height loss has progressed from the prior lumbar spine radiographs of 08/21/2023. 3. L2 superior endplate vertebral compression fracture (40% height loss), unchanged from the prior CT abdomen/pelvis of 08/08/2023. 3 mm bony retropulsion at the level of the L2 superior endplate resulting in mild spinal canal narrowing. 4. L3 superior endplate vertebral compression fracture with superimposed Schmorl node (40% height loss), unchanged from the prior CT abdomen/pelvis of 08/08/2023. 5. Lumbar spondylosis as outlined within the body of the report. 6. Levocurvature of the lumbar spine. 7. 5 mm L4-L5 facet mediated grade 1 anterolisthesis. 8.  Aortic Atherosclerosis (ICD10-I70.0). Electronically Signed   By: Jackey Loge D.O.   On: 08/30/2023 14:11   CT ABDOMEN PELVIS W CONTRAST  Result Date: 08/08/2023 CLINICAL DATA:  Abdominal/flank pain, stone suspected Dysuria, concern for pyelonephritis EXAM: CT ABDOMEN AND PELVIS WITH CONTRAST  TECHNIQUE: Multidetector CT imaging of the abdomen and pelvis was performed using the standard protocol following bolus administration of intravenous contrast. RADIATION DOSE REDUCTION: This exam was performed according to the departmental dose-optimization program which includes automated exposure control, adjustment of the mA and/or kV according to patient size and/or use of iterative reconstruction technique. CONTRAST:  OMNIPAQUE IOHEXOL 300 MG/ML  SOLN COMPARISON:  CT abdomen/pelvis dated April 03, 2023 FINDINGS: Lower chest: No acute abnormality. Hepatobiliary: No focal liver abnormality is seen. Status post cholecystectomy. Unchanged mild prominence of the common bile duct measuring up to 8 mm is likely secondary to post cholecystectomy state. No intrahepatic biliary ductal dilatation. Pancreas: Unremarkable. No pancreatic ductal dilatation or surrounding inflammatory changes. Spleen: Normal in size without focal abnormality. Adrenals/Urinary Tract: The adrenal glands are unremarkable. The kidneys enhance symmetrically. Mild left renal cortical scarring. No suspicious focal lesions. No renal or ureteral calculi. No hydronephrosis. The bladder is partially distended and otherwise grossly unremarkable. Stomach/Bowel: Stomach is within normal limits. Status post appendectomy. No evidence of bowel wall thickening, distention, or focal inflammatory changes. Sigmoid colonic diverticulosis without evidence of acute diverticulitis. Vascular/Lymphatic: Aortic atherosclerosis. No enlarged abdominal or pelvic lymph nodes. Reproductive: Uterus and bilateral adnexa are unremarkable. 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 No abdominal wall hernia visualized. No abdominopelvic ascites. No intraperitoneal free air. Musculoskeletal: There is a superior endplate compression deformity of the L2 vertebral body demonstrating approximately 40% height loss (series 6, image  65), which appears new compared to the prior exam dated July 2024. No significant osseous retropulsion. Multilevel degenerative changes of the visualized thoracolumbar spine with similar grade 1 anterolisthesis of L4 on L5. No suspicious osseous lesion. IMPRESSION: 1. Superior endplate compression deformity of the L2 vertebral body demonstrating approximately 40% height loss appears new compared to the prior exam dated July 2024. No significant osseous retropulsion. Recommend correlation with history and physical exam. 2. No evidence of renal or ureteral calculi or hydronephrosis. 3. 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 No abdominal wall hernia visualized. 4.  Aortic Atherosclerosis (ICD10-I70.0). 5. Additional unchanged ancillary findings as described above. Electronically Signed   By: Hart Robinsons M.D.   On: 08/08/2023  13:06   DG Abdomen 1 View  Result Date: 08/06/2023 CLINICAL DATA:  Right flank pain with radiation for 3 weeks. EXAM: ABDOMEN - 1 VIEW COMPARISON:  CT 04/03/2023 with contrast. FINDINGS: Gas seen in nondilated loops small and large bowel. Scattered colonic stool. Vascular calcifications seen along the aorta and along vessels in the pelvis. Previous cholecystectomy. No obvious free air on these supine radiographs. No definite abnormal calcification seen overlying either kidney nor along the expected course of either ureter. The calcifications in the pelvis has a appearance likely vascular calcifications and corresponding to the finding by prior CT scan. There is significant gas and stool overlying the reniform shadows limit evaluation for subtle calcification. If there is further concern renal or ureteral stones and noncontrast CT could be considered as clinically appropriate IMPRESSION: Nonspecific bowel gas pattern with scattered stool. Diffuse vascular calcifications. No definite abnormal calcification overlying either kidney nor along  the expected course of either ureter. Additional workup as clinically appropriate Electronically Signed   By: Karen Kays M.D.   On: 08/06/2023 16:03    Labs:  CBC: Recent Labs    08/08/23 0837 08/30/23 1321 09/02/23 0316 09/05/23 1216  WBC 6.5 6.9 5.0 5.9  HGB 12.3 11.2* 11.0* 12.1  HCT 37.8 34.6* 32.7* 36.8  PLT 271 258 248 270    COAGS: Recent Labs    09/05/23 1216  INR 1.0    BMP: Recent Labs    08/31/23 0648 09/01/23 0206 09/02/23 0316 09/05/23 1216  NA 139 138 136 138  K 4.0 3.3* 3.6 3.6  CL 104 103 103 102  CO2 22 27 26 26   GLUCOSE 87 95 101* 115*  BUN 13 12 11 10   CALCIUM 9.4 8.5* 8.6* 9.5  CREATININE 0.87 0.89 0.98 0.85  GFRNONAA >60 >60 60* >60    LIVER FUNCTION TESTS: Recent Labs    04/03/23 1618 04/30/23 1756 08/03/23 1030 08/08/23 0837  BILITOT 0.9 0.9 0.4 0.6  AST 19 17 13 16   ALT 10 11 9 9   ALKPHOS 74 73 107 77  PROT 7.4 6.7 6.5 6.9  ALBUMIN 4.5 3.2* 4.3 3.7    TUMOR MARKERS: No results for input(s): "AFPTM", "CEA", "CA199", "CHROMGRNA" in the last 8760 hours.  Assessment and Plan: Patient with past medical history of osteoporosis presents with complaint of intractable back pain.  IR consulted for vertebroplasty/kyphoplasty. Case reviewed by Dr. Archer Asa who approves patient for procedure.  Patient presents today in their usual state of health.  She has been NPO and is not currently on blood thinners.   Patient reporting extreme nausea.  Will give 8mg  IV Zofran.  Consider rx PO Zofran for home use.   Risks and benefits of vertebroplasty/kyphoplasty were discussed with the patient including, but not limited to education regarding the natural healing process of compression fractures without intervention, bleeding, infection, cement migration which may cause spinal cord damage, paralysis, pulmonary embolism or even death.  This interventional procedure involves the use of X-rays and because of the nature of the planned procedure,  it is possible that we will have prolonged use of X-ray fluoroscopy.  Potential radiation risks to you include (but are not limited to) the following: - A slightly elevated risk for cancer  several years later in life. This risk is typically less than 0.5% percent. This risk is low in comparison to the normal incidence of human cancer, which is 33% for women and 50% for men according to the [employer]. - Radiation induced  injury can include skin redness, resembling a rash, tissue breakdown / ulcers and hair loss (which can be temporary or permanent).   The likelihood of either of these occurring depends on the difficulty of the procedure and whether you are sensitive to radiation due to previous procedures, disease, or genetic conditions.   IF your procedure requires a prolonged use of radiation, you will be notified and given written instructions for further action.  It is your responsibility to monitor the irradiated area for the 2 weeks following the procedure and to notify your physician if you are concerned that you have suffered a radiation induced injury.    All of the patient's questions were answered, patient is agreeable to proceed.  Consent signed and in chart.  Advance Care Plan: The advanced care plan/surrogate decision maker was discussed at the time of visit and documented in the medical record.     Thank you for this interesting consult.  I greatly enjoyed meeting [employer] and look forward to participating in their care.  A copy of this report was sent to the requesting provider on this date.  Electronically Signed: Hoyt Koch, PA 09/05/2023, 12:50 PM   I spent a total of  30 Minutes   in face to face in clinical consultation, greater than 50% of which was counseling/coordinating care for T12, L1, and L2 compression fractures.

## 2023-09-05 NOTE — Procedures (Signed)
  Procedure:  Thoracic kyphoplasty T12, Lumbar kyphoplasty L1 and L2   Preprocedure diagnosis: Diagnoses of Compression fracture of L2 lumbar vertebra, open, initial encounter (HCC), Compression fracture of T12 vertebra, initial encounter [facility]), Osteoporotic compression fracture of vertebra, initial encounter (HCC), and Compression fracture of lumbar vertebra, unspecified lumbar vertebral level, initial encounter [facility]) were pertinent to this visit. Postprocedure diagnosis: same EBL:    minimal Complications:   none immediate  See full dictation in [employer].  Thora Lance MD Main # [phone] Pager  [phone] Mobile [phone]

## 2023-09-05 NOTE — Progress Notes (Signed)
Prescription for 4mg  Zofran ODT sent to patient's preferred pharmacy due to pain/narcotic-related nausea.    Patient and son aware.   Loyce Dys, MS RD PA-C

## 2023-09-05 NOTE — Progress Notes (Signed)
Patient awake, alert, and having less pain.  Tolerated PO beverage and saltine crackers.  Post procedure 3 hour bedrest complete and patient requesting to get up to go to restroom prior to dressing and discharge.  Up at bedside and had some brief pain. To bathroom via wheelchair and while in restroom had increase in pain.  Was able to void and get back to wheelchair and to room. Pain causing nausea and increased and patient requesting to lay back down. Repositioned back onto stretcher and onto right side per patient preference.  Called and spoke with Dr. Corlis Leak and placed orders for PO pain med and antiemetic, and given (see MAR).

## 2023-09-06 ENCOUNTER — Encounter: Payer: Self-pay | Admitting: Physician Assistant

## 2023-09-14 ENCOUNTER — Telehealth: Payer: Self-pay | Admitting: Neurosurgery

## 2023-09-14 NOTE — Telephone Encounter (Signed)
Patient left a voicemail stating she completed her Kyphoplasty on 12.11 and she is having pain still. She noticed her pain is now on the left side of her back. Typically it would go across her lower back but now she says it is stronger on the left side only. Wants to know if this is normal or should she be concerned, please advise

## 2023-09-14 NOTE — Telephone Encounter (Signed)
Per Dr Myer Haff: If no new neuro def, not concerned, but she should ask IR their thoughts.   I notified Amanda Lambert of Dr Lucienne Capers response. She will give their office a call to ask them what is normal/what should be expected after a kyphoplasty.

## 2023-09-15 ENCOUNTER — Other Ambulatory Visit: Payer: Self-pay

## 2023-09-15 ENCOUNTER — Encounter: Payer: Self-pay | Admitting: *Deleted

## 2023-09-15 ENCOUNTER — Ambulatory Visit
Admission: EM | Admit: 2023-09-15 | Discharge: 2023-09-15 | Disposition: A | Discharge status: Home / Self Care | Payer: Medicare Other | Attending: Emergency Medicine | Admitting: Emergency Medicine

## 2023-09-15 DIAGNOSIS — N3000 Acute cystitis without hematuria: Secondary | ICD-10-CM | POA: Diagnosis not present

## 2023-09-15 LAB — POCT URINALYSIS DIP (MANUAL ENTRY)
Glucose, UA: 250 mg/dL — AB
Nitrite, UA: POSITIVE — AB
Protein Ur, POC: >=300 mg/dL — AB
Spec Grav, UA: <=1.005 — AB
Urobilinogen, UA: >=8 U/dL — AB
pH, UA: 5

## 2023-09-15 MED ORDER — ONDANSETRON 4 MG PO TBDP: 4.0000 mg | ORAL_TABLET | Freq: Three times a day (TID) | ORAL | 0 refills | Status: DC | PRN

## 2023-09-15 MED ORDER — CIPROFLOXACIN HCL 500 MG PO TABS: 500.0000 mg | ORAL_TABLET | Freq: Two times a day (BID) | ORAL | 0 refills | Status: AC

## 2023-09-15 NOTE — ED Provider Notes (Signed)
Amanda Lambert    CSN: [id number] Arrival date & time: 09/15/23  3664      History   Chief Complaint Chief Complaint  Patient presents with   Back Pain    HPI Amanda Lambert is a 77 y.o. female.   Patient presents for evaluation of urinary urgency, dysuria and nausea and vomiting present for 4 days.  Decreased fluid intake generally.  History of reoccurring infections.  Has attempted use of Azo which has been somewhat helpful.  Denies hematuria, unable to determine abdominal or flank pain is recent kyphoplasty.   Past Medical History:  Diagnosis Date   Anemia    Angina pectoris (HCC)    Aortic atherosclerosis (HCC)    Arthritis    Bilateral carotid artery disease (HCC) 10/15/2019   a.) carotid doppler 10/15/2019: 50-69% BICA; b.) carotid doppler 04/06/2020: 50-69% RICA, 1-15% LICA; c.) carotid doppler 11/03/2020 and 04/29/2021: 50-69% RICA, 16-49% LICA; d.) carotid doppler 03/02/2022: 50-69% BICA; e.) carotid doppler 03/14/2023: 50-69% RICA, 16-49% LICA   Biliary dyskinesia    CAD (coronary artery disease) 06/14/2021   a.) LHC 11/04/2019: 50% mLAD - med mgmt; b.) LHC/PCI 06/14/2021: 40% p-mLAD, 80% mLAD (3.0 x 30 mm Onyx Frontier DES; c.) LHC for FFR study: 07/19/2021: 40% pLAD; RFR 0.92, FFR 0.89, CFR 4.8, IMR 18 --> no significant macro/microvascular disease   CKD (chronic kidney disease), stage III (HCC)    Complication of anesthesia    a.) PONV; b.) delayed emergence   Constipation    Diastolic dysfunction 10/15/2019   a.) TTE 10/15/2019: EF 50-55%, norm LAP, mild LA dil, mod MR, mild-mod TR, G1DD; b.) TTE 05/19/2021: EF 50-55%, mild basal sep hypertrophy, norm LAP, triv MR/TR, G1DD   GERD (gastroesophageal reflux disease)    Hepatitis B 1975   Hypercholesteremia    Hypertension    Inguinal hernia    Irritable bowel syndrome without diarrhea    LBBB (left bundle branch block)    Leukopenia    Long term current use of aspirin    PONV (postoperative nausea and  vomiting)    [facility] spotted fever 04/01/2012   adm. to hospital   Syncope 10/17/2019   Thrombocytopenia (HCC)    Transaminitis    Trochanteric bursitis, right hip    Vaginal Pap smear, abnormal    Varicose veins of leg with edema, bilateral    Weakness     Patient Active Problem List   Diagnosis Date Noted   Compression fracture of L2 lumbar vertebra, open, initial encounter (HCC) 08/31/2023   Compression fracture of T12 vertebra (HCC) 08/31/2023   Lumbar compression fracture (HCC) 08/30/2023   CHF (congestive heart failure) (HCC) 04/17/2023   Coronary artery disease of native artery of native heart with stable angina pectoris (HCC) 07/04/2021   Hypercholesteremia 07/04/2021   LAD stenosis 06/13/2021   Angina pectoris (HCC) 11/03/2019   Biliary dyskinesia 04/09/2012   Transaminitis 04/01/2012   Lower urinary tract infectious disease 04/01/2012   HTN (hypertension) 04/01/2012   GERD (gastroesophageal reflux disease) 04/01/2012    Past Surgical History:  Procedure Laterality Date   APPENDECTOMY  09/25/1961   CHOLECYSTECTOMY  04/26/2012   Procedure: LAPAROSCOPIC CHOLECYSTECTOMY WITH INTRAOPERATIVE CHOLANGIOGRAM;  Surgeon: Robyne Askew, MD;  Location: [facility] ORS;  Service: General;  Laterality: N/A;   COLONOSCOPY     CORONARY ANGIOGRAPHY N/A 07/19/2021   Procedure: CORONARY ANGIOGRAPHY (CATH LAB);  Surgeon: Yates Decamp, MD;  Location: [facility] INVASIVE CV LAB;  Service: Cardiovascular;  Laterality:  N/A;   CORONARY IMAGING/OCT N/A 06/14/2021   Procedure: INTRAVASCULAR IMAGING/OCT;  Surgeon: Yates Decamp, MD;  Location: [facility] INVASIVE CV LAB;  Service: Cardiovascular;  Laterality: N/A;   CORONARY PRESSURE/FFR STUDY N/A 07/19/2021   Procedure: INTRAVASCULAR PRESSURE WIRE/FFR STUDY;  Surgeon: Yates Decamp, MD;  Location: [facility] INVASIVE CV LAB;  Service: Cardiovascular;  Laterality: N/A;   CORONARY STENT INTERVENTION N/A 06/14/2021   Procedure: CORONARY STENT INTERVENTION;  Surgeon: Yates Decamp, MD;  Location: [facility] INVASIVE CV LAB;  Service: Cardiovascular;  Laterality: N/A;   DILATION AND CURETTAGE OF UTERUS  09/25/1980   for miscarriage   DILATION AND CURETTAGE, DIAGNOSTIC / THERAPEUTIC  09/25/1970   INSERTION OF MESH Right 05/03/2023   Procedure: INSERTION OF MESH;  Surgeon: Sung Amabile, DO;  Location: [facility] ORS;  Service: General;  Laterality: Right;   IR KYPHO EA ADDL LEVEL THORACIC OR LUMBAR  09/05/2023   IR KYPHO LUMBAR INC FX REDUCE BONE BX UNI/BIL CANNULATION INC/IMAGING  09/05/2023   IR KYPHO THORACIC WITH BONE BIOPSY  09/05/2023   LEFT HEART CATH AND CORONARY ANGIOGRAPHY N/A 11/04/2019   Procedure: LEFT HEART CATH AND CORONARY ANGIOGRAPHY;  Surgeon: Yates Decamp, MD;  Location: [facility] INVASIVE CV LAB;  Service: Cardiovascular;  Laterality: N/A;   LEFT HEART CATH AND CORONARY ANGIOGRAPHY N/A 06/14/2021   Procedure: LEFT HEART CATH AND CORONARY ANGIOGRAPHY;  Surgeon: Yates Decamp, MD;  Location: [facility] INVASIVE CV LAB;  Service: Cardiovascular;  Laterality: N/A;   TONSILLECTOMY  1964  - approximate   WRIST FRACTURE SURGERY  09/25/2006   right    OB History     Gravida  2   Para      Term      Preterm      AB  1   Living  1      SAB  1   IAB      Ectopic      Multiple      Live Births  1            Home Medications    Prior to Admission medications   Medication Sig Start Date End Date Taking? Authorizing Provider  ciprofloxacin (CIPRO) 500 MG tablet Take 1 tablet (500 mg total) by mouth every 12 (twelve) hours for 3 days. 09/15/23 09/18/23 Yes Mariana Wiederholt R, NP  ondansetron (ZOFRAN-ODT) 4 MG disintegrating tablet Take 1 tablet (4 mg total) by mouth every 8 (eight) hours as needed. 09/15/23  Yes Sharad Vaneaton R, NP  calcitonin, salmon, (MIACALCIN/FORTICAL) 200 UNIT/ACT nasal spray Place 1 spray into alternate nostrils daily. 09/03/23   Jonah Blue, MD  chlordiazePOXIDE-Clidinium (LIBRAX PO) Take by mouth as needed. Before meals bid as needed     [provider]  Cyanocobalamin (VITAMIN B-12 PO) Take 1 tablet by mouth in the morning.    [provider]  diphenhydrAMINE HCl (BENADRYL PO) Take by mouth as needed.    [provider]  EPINEPHrine 0.3 mg/0.3 mL IJ SOAJ injection Inject 0.3 mg into the muscle once as needed for anaphylaxis.    [provider]  esomeprazole (NEXIUM) 40 MG capsule Take 40 mg by mouth in the morning.   Yes [provider]  estradiol (ESTRACE) 0.1 MG/GM vaginal cream Place 1 g vaginally 3 (three) times a week. 07/02/23   Milas Hock, MD  furosemide (LASIX) 20 MG tablet Take 1 tablet (20 mg total) by mouth as needed. 08/03/23   Jodelle Gross, NP  gabapentin (NEURONTIN) 300 MG  capsule Take 300 mg by mouth 2 (two) times daily. 02/14/21  Yes [provider]  isosorbide mononitrate (IMDUR) 120 MG 24 hr tablet Take 1 tablet (120 mg total) by mouth daily. 08/03/23  Yes Jodelle Gross, NP  lidocaine (LIDODERM) 5 % Place 1 patch onto the skin daily. Remove & Discard patch within 12 hours or as directed by MD 09/02/23   Jonah Blue, MD  magnesium 30 MG tablet Take 30 mg by mouth 2 (two) times daily.   Yes [provider]  methocarbamol (ROBAXIN) 500 MG tablet Take 1 tablet (500 mg total) by mouth every 6 (six) hours as needed for muscle spasms. 08/21/23  Yes Venetia Night, MD  nitroGLYCERIN (NITROSTAT) 0.4 MG SL tablet Place 1 tablet (0.4 mg total) under the tongue every 5 (five) minutes as needed for up to 25 days for chest pain. 08/03/23 08/30/23  Jodelle Gross, NP  ondansetron (ZOFRAN) 4 MG tablet Take by mouth. 04/04/23   [provider]  oxyCODONE (OXY IR/ROXICODONE) 5 MG immediate release tablet Take 1 tablet (5 mg total) by mouth every 6 (six) hours as needed for moderate pain (pain score 4-6). 09/02/23   Jonah Blue, MD  potassium chloride SA (KLOR-CON M20) 20 MEQ tablet Take 1 tablet (20 mEq total) by mouth daily. 08/03/23  11/01/23  Jodelle Gross, NP  Probiotic Product (ALIGN PO) Take by mouth.   Yes [provider]  propranolol (INDERAL) 20 MG tablet Take 1 tablet (20 mg total) by mouth 3 (three) times daily. 07/16/23  Yes Yates Decamp, MD  rosuvastatin (CRESTOR) 10 MG tablet TAKE 1 TABLET BY MOUTH EVERY DAY 06/29/23  Yes Yates Decamp, MD  sodium chloride (OCEAN) 0.65 % SOLN nasal spray Place 1 spray into both nostrils as needed for congestion.    [provider]  VITAMIN D PO Take 1 capsule by mouth every other day. In the morning   Yes [provider]    Family History Family History  Problem Relation Age of Onset   Cardiomyopathy Mother    Coronary artery disease Father    Hypertension Brother    Prostate cancer Brother     Social History Social History   Tobacco Use   Smoking status: Never   Smokeless tobacco: Never  Vaping Use   Vaping status: Never Used  Substance Use Topics   Alcohol use: No   Drug use: No     Allergies   Ace inhibitors, Bee venom, Thorazine [chlorpromazine], Wasp venom, Levaquin [levofloxacin in d5w], Shrimp [shellfish allergy], Amlodipine, Atropine sulfate, Elemental sulfur, Glycopyrrolate, Ramipril, Sulfa antibiotics, Compazine [prochlorperazine edisylate], and Latex   Review of Systems Review of Systems   Physical Exam Triage Vital Signs ED Triage Vitals  Encounter Vitals Group     BP 09/15/23 1014 (!) 148/63     Systolic BP Percentile --      Diastolic BP Percentile --      Pulse Rate 09/15/23 1014 79     Resp 09/15/23 1014 20     Temp 09/15/23 1014 98.2 F (36.8 C)     Temp src --      SpO2 09/15/23 1014 95 %     Weight --      Height --      Head Circumference --      Peak Flow --      Pain Score 09/15/23 1009 5     Pain Loc --      Pain Education --  Exclude from Growth Chart --    No data found.  Updated Vital Signs BP (!) 148/63   Pulse 79   Temp 98.2 F (36.8 C)   Resp 20   SpO2 95%   Visual  Acuity Right Eye Distance:   Left Eye Distance:   Bilateral Distance:    Right Eye Near:   Left Eye Near:    Bilateral Near:     Physical Exam Constitutional:      Appearance: Normal appearance.  Eyes:     Extraocular Movements: Extraocular movements intact.  Abdominal:     Tenderness: There is no right CVA tenderness or left CVA tenderness.  Genitourinary:    Comments: deferred Neurological:     Mental Status: She is alert and oriented to person, place, and time. Mental status is at baseline.      UC Treatments / Results  Labs (all labs ordered are listed, but only abnormal results are displayed) Labs Reviewed  POCT URINALYSIS DIP (MANUAL ENTRY) - Abnormal; Notable for the following components:      Result Value   Color, UA orange (*)    Glucose, UA =250 (*)    Bilirubin, UA large (*)    Ketones, POC UA moderate (40) (*)    Spec Grav, UA <=1.005 (*)    Blood, UA moderate (*)    Protein Ur, POC >=300 (*)    Urobilinogen, UA >=8.0 (*)    Nitrite, UA Positive (*)    Leukocytes, UA Large (3+) (*)    All other components within normal limits  URINE CULTURE    EKG   Radiology No results found.  Procedures Procedures (including critical care time)  Medications Ordered in UC Medications - No data to display  Initial Impression / Assessment and Plan / UC Course  I have reviewed the triage vital signs and the nursing notes.  Pertinent labs & imaging results that were available during my care of the patient were reviewed by me and considered in my medical decision making (see chart for details).  Acute cystitis without hematuria  Urinalysis showing leukocytes and nitrates, sent for culture, per chart review typical culture shows E. coli, discussed with patient, ciprofloxacin sent to pharmacy, has had success within the past, additionally prescribe Zofran for management of nausea and vomiting, advised increase fluid intake, may continue use of Azo, advise follow-up  with urgent care as needed Final Clinical Impressions(s) / UC Diagnoses   Final diagnoses:  Acute cystitis without hematuria     Discharge Instructions      Your urinalysis shows Tatiyanna Lashley blood cells and nitrates which are indicative of infection, your urine will be sent to the lab to determine exactly which bacteria is present, if any changes need to be made to your medications you will be notified  Begin use of ciprofloxacin every morning and every evening for 3 days  You may use Zofran every 8 hours as needed to help with nausea and vomiting, placed under tongue and allow to dissolve  You may use over-the-counter Azo to help minimize your symptoms until antibiotic removes bacteria, this medication will turn your urine orange  Increase your fluid intake through use of water  As always practice good hygiene, wiping front to back and avoidance of scented vaginal products to prevent further irritation  If symptoms continue to persist after use of medication or recur please follow-up with urgent care or your primary doctor as needed    ED Prescriptions  Medication Sig Dispense Auth. Provider   ciprofloxacin (CIPRO) 500 MG tablet Take 1 tablet (500 mg total) by mouth every 12 (twelve) hours for 3 days. 6 tablet Hurman Ketelsen R, NP   ondansetron (ZOFRAN-ODT) 4 MG disintegrating tablet Take 1 tablet (4 mg total) by mouth every 8 (eight) hours as needed. 20 tablet Valinda Hoar, NP      PDMP not reviewed this encounter.   Valinda Hoar, NP 09/15/23 1026

## 2023-09-15 NOTE — ED Triage Notes (Signed)
PT reports back pain ,N/V, dysuria . Pt reports shew has not been drinking enough due to nausea.

## 2023-09-15 NOTE — Discharge Instructions (Addendum)
Your urinalysis shows Amanda Lambert blood cells and nitrates which are indicative of infection, your urine will be sent to the lab to determine exactly which bacteria is present, if any changes need to be made to your medications you will be notified  Begin use of ciprofloxacin every morning and every evening for 3 days  You may use Zofran every 8 hours as needed to help with nausea and vomiting, placed under tongue and allow to dissolve  You may use over-the-counter Azo to help minimize your symptoms until antibiotic removes bacteria, this medication will turn your urine orange  Increase your fluid intake through use of water  As always practice good hygiene, wiping front to back and avoidance of scented vaginal products to prevent further irritation  If symptoms continue to persist after use of medication or recur please follow-up with urgent care or your primary doctor as needed

## 2023-09-17 LAB — URINE CULTURE: Culture: 80000 — AB

## 2023-09-27 ENCOUNTER — Telehealth: Payer: Self-pay | Admitting: Neurosurgery

## 2023-09-27 NOTE — Telephone Encounter (Signed)
 She is scheduled with [name] on 10/02/23. Has has had 3 kyphos done and now she has pain in her upper back. Can [name] order xrays for that appt. She wants to make sure she did not fracture another vertebrae. She said that she does not need to discuss DEXA results.

## 2023-09-28 ENCOUNTER — Ambulatory Visit
Admission: RE | Admit: 2023-09-28 | Discharge: 2023-09-28 | Disposition: A | Discharge status: Home / Self Care | Payer: Medicare Other | Attending: Neurosurgery | Admitting: Neurosurgery

## 2023-09-28 ENCOUNTER — Ambulatory Visit
Admission: RE | Admit: 2023-09-28 | Discharge: 2023-09-28 | Disposition: A | Discharge status: Home / Self Care | Payer: Medicare Other | Source: Ambulatory Visit | Attending: Neurosurgery | Admitting: Neurosurgery

## 2023-09-28 ENCOUNTER — Other Ambulatory Visit: Payer: Self-pay | Admitting: Neurosurgery

## 2023-09-28 DIAGNOSIS — M546 Pain in thoracic spine: Secondary | ICD-10-CM

## 2023-09-28 DIAGNOSIS — S32028K Other fracture of second lumbar vertebra, subsequent encounter for fracture with nonunion: Secondary | ICD-10-CM | POA: Insufficient documentation

## 2023-09-28 DIAGNOSIS — M4856XA Collapsed vertebra, not elsewhere classified, lumbar region, initial encounter for fracture: Secondary | ICD-10-CM | POA: Diagnosis not present

## 2023-09-28 DIAGNOSIS — M5136 Other intervertebral disc degeneration, lumbar region with discogenic back pain only: Secondary | ICD-10-CM | POA: Diagnosis not present

## 2023-09-28 NOTE — Telephone Encounter (Signed)
 I called Amanda Lambert back to update her that [name] ordered lumbar and thoracic x-rays. She states that she will get these prior to her appointment on 10/02/2023 with [name] PA so that they are able to be reviewed during their appointment.

## 2023-09-28 NOTE — Telephone Encounter (Signed)
 I called patient to inquire about her pain and specifics as requested by [facility] PA. Patient states that her pain started about 2 weeks ago in the upper back region. Over time she says that this pain has localized to the right scapula radiating towards the spine. The pain has caused her significant issues with sleeping at night. She describes the pain as dull and throbbing. She scores her pain as an 8/10.

## 2023-09-28 NOTE — Progress Notes (Signed)
 Pt contacting the office with thoracic back pain x 2 weeks and requesting xrays of this area at 1/7 follow up for L2 fracture. Lumbar and thoracic xrays ordered

## 2023-10-02 ENCOUNTER — Ambulatory Visit: Payer: Medicare Other | Admitting: Neurosurgery

## 2023-10-02 VITALS — BP 132/78

## 2023-10-02 DIAGNOSIS — S32028K Other fracture of second lumbar vertebra, subsequent encounter for fracture with nonunion: Secondary | ICD-10-CM

## 2023-10-02 DIAGNOSIS — M5489 Other dorsalgia: Secondary | ICD-10-CM

## 2023-10-02 DIAGNOSIS — M25511 Pain in right shoulder: Secondary | ICD-10-CM | POA: Diagnosis not present

## 2023-10-02 DIAGNOSIS — M546 Pain in thoracic spine: Secondary | ICD-10-CM

## 2023-10-02 DIAGNOSIS — S32020K Wedge compression fracture of second lumbar vertebra, subsequent encounter for fracture with nonunion: Secondary | ICD-10-CM

## 2023-10-02 DIAGNOSIS — S22080K Wedge compression fracture of T11-T12 vertebra, subsequent encounter for fracture with nonunion: Secondary | ICD-10-CM

## 2023-10-02 DIAGNOSIS — S32008A Other fracture of unspecified lumbar vertebra, initial encounter for closed fracture: Secondary | ICD-10-CM

## 2023-10-02 MED ORDER — CYCLOBENZAPRINE HCL 10 MG PO TABS: 10.0000 mg | ORAL_TABLET | Freq: Three times a day (TID) | ORAL | 0 refills | Status: DC | PRN

## 2023-10-02 NOTE — Progress Notes (Signed)
 Follow-up note: Referring Physician:  Cleotilde Joya BRAVO, PA [address] [place],  [place] [address]  Primary Physician:  Cleotilde, [place]  E, PA  Chief Complaint:  f/u for compression fracture  History of Present Illness: Amanda Lambert is a 78 y.o. female who presents today for hospital follow-up of compression fracture. She was seen in the hospital about 4 weeks ago with worsening low back pain and was found to have multiple thoracolumbar compression fractures.  She underwent kyphoplasty on 09/05/2023 at T12, L1, and L3.  She states that she had some flank pain after this however she presented to the ER on 09/15/2023 and was diagnosed with a UTI.  The symptoms have since resolved.  Her current complaint is pain in her right intrascapular region that is worse with laying flat.  She has been taking gabapentin  600 mg twice daily which does provide some relief.  She denies any pain that radiates down the length of her arm.  She has also tried Robaxin  and over-the-counter medications without any significant relief.  08/31/2023 [name] is here today with a chief complaint of worsening pain.   I saw her in clinic approximately 10 days ago.  She was doing well at that time.  Over the past several days, she has had worsening back pain.  She presented with incapacitating pain.  Workup revealed a worsening fracture 2 levels above her previously identified fracture.  She denies any other neurologic symptoms.   08/21/2023 [name] is here today with a chief complaint of pain which precipitated workup showing an L2 compression fracture.  She has no new neurologic symptoms.  She was given an LSO brace and has been compliant with her brace.  It does help her.  Her pain is improved significantly over the past couple of weeks.  She is however still having pain.     Conservative measures:  Physical therapy: has not participated in  Multimodal medical therapy including regular  antiinflammatories:  baclofen , lidocaine  patches, hydrocodone ,  Injections: has not received epidural steroid injections   Past Surgery: no prior spinal surgeries   [name] has no symptoms of cervical myelopathy.   The symptoms are causing a significant impact on the patient's life.    I have utilized the care everywhere function in epic to review the outside records available from external health systems.  Review of Systems:  A 10 point review of systems is negative,  and the pertinent positives and negatives detailed in the HPI.  Past Medical History: Past Medical History:  Diagnosis Date   Anemia    Angina pectoris (HCC)    Aortic atherosclerosis (HCC)    Arthritis    Bilateral carotid artery disease (HCC) 10/15/2019   a.) carotid doppler 10/15/2019: 50-69% BICA; b.) carotid doppler 04/06/2020: 50-69% RICA, 1-15% LICA; c.) carotid doppler 11/03/2020 and 04/29/2021: 50-69% RICA, 16-49% LICA; d.) carotid doppler 03/02/2022: 50-69% BICA; e.) carotid doppler 03/14/2023: 50-69% RICA, 16-49% LICA   Biliary dyskinesia    CAD (coronary artery disease) 06/14/2021   a.) LHC 11/04/2019: 50% mLAD - med mgmt; b.) LHC/PCI 06/14/2021: 40% p-mLAD, 80% mLAD (3.0 x 30 mm Onyx Frontier DES; c.) LHC for FFR study: 07/19/2021: 40% pLAD; RFR 0.92, FFR 0.89, CFR 4.8, IMR 18 --> no significant macro/microvascular disease   CKD (chronic kidney disease), stage III (HCC)    Complication of anesthesia    a.) PONV; b.) delayed emergence   Constipation    Diastolic dysfunction  10/15/2019   a.) TTE 10/15/2019: EF 50-55%, norm LAP, mild LA dil, mod MR, mild-mod TR, G1DD; b.) TTE 05/19/2021: EF 50-55%, mild basal sep hypertrophy, norm LAP, triv MR/TR, G1DD   GERD (gastroesophageal reflux disease)    Hepatitis B 1975   Hypercholesteremia    Hypertension    Inguinal hernia    Irritable bowel syndrome without diarrhea    LBBB (left bundle branch block)    Leukopenia    Long term current use of aspirin      PONV (postoperative nausea and vomiting)    [facility] spotted fever 04/01/2012   adm. to hospital   Syncope 10/17/2019   Thrombocytopenia (HCC)    Transaminitis    Trochanteric bursitis, right hip    Vaginal Pap smear, abnormal    Varicose veins of leg with edema, bilateral    Weakness     Past Surgical History: Past Surgical History:  Procedure Laterality Date   APPENDECTOMY  09/25/1961   CHOLECYSTECTOMY  04/26/2012   Procedure: LAPAROSCOPIC CHOLECYSTECTOMY WITH INTRAOPERATIVE CHOLANGIOGRAM;  Surgeon: Deward GORMAN Curvin DOUGLAS, MD;  Location: [facility];  Service: General;  Laterality: N/A;   COLONOSCOPY     CORONARY ANGIOGRAPHY N/A 07/19/2021   Procedure: CORONARY ANGIOGRAPHY (CATH LAB);  Surgeon: Ladona Heinz, MD;  Location: [facility] INVASIVE CV LAB;  Service: Cardiovascular;  Laterality: N/A;   CORONARY IMAGING/OCT N/A 06/14/2021   Procedure: INTRAVASCULAR IMAGING/OCT;  Surgeon: Ladona Heinz, MD;  Location: [facility] INVASIVE CV LAB;  Service: Cardiovascular;  Laterality: N/A;   CORONARY PRESSURE/FFR STUDY N/A 07/19/2021   Procedure: INTRAVASCULAR PRESSURE WIRE/FFR STUDY;  Surgeon: Ladona Heinz, MD;  Location: [facility] INVASIVE CV LAB;  Service: Cardiovascular;  Laterality: N/A;   CORONARY STENT INTERVENTION N/A 06/14/2021   Procedure: CORONARY STENT INTERVENTION;  Surgeon: Ladona Heinz, MD;  Location: [facility] INVASIVE CV LAB;  Service: Cardiovascular;  Laterality: N/A;   DILATION AND CURETTAGE OF UTERUS  09/25/1980   for miscarriage   DILATION AND CURETTAGE, DIAGNOSTIC / THERAPEUTIC  09/25/1970   INSERTION OF MESH Right 05/03/2023   Procedure: INSERTION OF MESH;  Surgeon: Tye Millet, DO;  Location: [facility] ORS;  Service: General;  Laterality: Right;   IR KYPHO EA ADDL LEVEL THORACIC OR LUMBAR  09/05/2023   IR KYPHO LUMBAR INC FX REDUCE BONE BX UNI/BIL CANNULATION INC/IMAGING  09/05/2023   IR KYPHO THORACIC WITH BONE BIOPSY  09/05/2023   LEFT HEART CATH AND CORONARY ANGIOGRAPHY N/A 11/04/2019   Procedure: LEFT HEART  CATH AND CORONARY ANGIOGRAPHY;  Surgeon: Ladona Heinz, MD;  Location: [facility] IN[facility] CV LAB;  Service: Cardiovascular;  Laterality: N/A;   LEFT HEART CATH AND CORONARY ANGIOGRAPHY N/A 06/14/2021   Procedure: LEFT HEART CATH AND CORONARY ANGIOGRAPHY;  Surgeon: Ladona Heinz, MD;  Location: [facility] INVASIVE CV LAB;  Service: Cardiovascular;  Laterality: N/A;   TONSILLECTOMY  1964  - approximate   WRIST FRACTURE SURGERY  09/25/2006   right    Allergies: Allergies as of 10/02/2023 - Review Complete 10/02/2023  Allergen Reaction Noted   Ace inhibitors Anaphylaxis 03/30/2012   Bee venom Anaphylaxis and Other (See Comments) 01/28/2022   Thorazine [chlorpromazine] Anaphylaxis 03/30/2012   Wasp venom Anaphylaxis 10/17/2019   Levaquin  [levofloxacin  in d5w] Nausea And Vomiting 08/28/2016   Shrimp [shellfish allergy] Rash and Hives 04/04/2014   Amlodipine   01/25/2021   Atropine sulfate Other (See Comments) 01/28/2022   Elemental sulfur Itching 10/13/2019   Glycopyrrolate  Other (See Comments) 01/28/2022   Ramipril Other (See Comments) 01/28/2022   Sulfa antibiotics Other (  See Comments) 01/28/2022   Compazine [prochlorperazine edisylate] Anxiety 03/30/2012   Latex Rash 08/28/2016    Medications: Outpatient Encounter Medications as of 10/02/2023  Medication Sig   calcitonin, salmon, (MIACALCIN /FORTICAL) 200 UNIT/ACT nasal spray Place 1 spray into alternate nostrils daily.   chlordiazePOXIDE-Clidinium (LIBRAX PO) Take by mouth as needed. Before meals bid as needed   Cyanocobalamin  (VITAMIN B-12 PO) Take 1 tablet by mouth in the morning.   cyclobenzaprine  (FLEXERIL ) 10 MG tablet Take 1 tablet (10 mg total) by mouth 3 (three) times daily as needed for muscle spasms.   EPINEPHrine  0.3 mg/0.3 mL IJ SOAJ injection Inject 0.3 mg into the muscle once as needed for anaphylaxis.   esomeprazole (NEXIUM) 40 MG capsule Take 40 mg by mouth in the morning.   estradiol  (ESTRACE ) 0.1 MG/GM vaginal cream Place 1 g vaginally 3  (three) times a week.   furosemide  (LASIX ) 20 MG tablet Take 1 tablet (20 mg total) by mouth as needed.   gabapentin  (NEURONTIN ) 300 MG capsule Take 300 mg by mouth 2 (two) times daily.   isosorbide  mononitrate (IMDUR ) 120 MG 24 hr tablet Take 1 tablet (120 mg total) by mouth daily.   lidocaine  (LIDODERM ) 5 % Place 1 patch onto the skin daily. Remove & Discard patch within 12 hours or as directed by MD   magnesium 30 MG tablet Take 30 mg by mouth 2 (two) times daily.   nitroGLYCERIN  (NITROSTAT ) 0.4 MG SL tablet Place 1 tablet (0.4 mg total) under the tongue every 5 (five) minutes as needed for up to 25 days for chest pain.   ondansetron  (ZOFRAN -ODT) 4 MG disintegrating tablet Take 1 tablet (4 mg total) by mouth every 8 (eight) hours as needed.   oxyCODONE  (OXY IR/ROXICODONE ) 5 MG immediate release tablet Take 1 tablet (5 mg total) by mouth every 6 (six) hours as needed for moderate pain (pain score 4-6).   potassium chloride  SA (KLOR-CON  M20) 20 MEQ tablet Take 1 tablet (20 mEq total) by mouth daily.   Probiotic Product (ALIGN PO) Take by mouth.   propranolol  (INDERAL ) 20 MG tablet Take 1 tablet (20 mg total) by mouth 3 (three) times daily.   rosuvastatin  (CRESTOR ) 10 MG tablet TAKE 1 TABLET BY MOUTH EVERY DAY   sodium chloride  (OCEAN) 0.65 % SOLN nasal spray Place 1 spray into both nostrils as needed for congestion.   VITAMIN D  PO Take 1 capsule by mouth every other day. In the morning   [DISCONTINUED] methocarbamol  (ROBAXIN ) 500 MG tablet Take 1 tablet (500 mg total) by mouth every 6 (six) hours as needed for muscle spasms.   [DISCONTINUED] diphenhydrAMINE  HCl (BENADRYL  PO) Take by mouth as needed.   [DISCONTINUED] ondansetron  (ZOFRAN ) 4 MG tablet Take by mouth. (Patient not taking: Reported on 10/02/2023)   No facility-administered encounter medications on file as of 10/02/2023.    Social History: Social History   Tobacco Use   Smoking status: Never   Smokeless tobacco: Never  Vaping Use    Vaping status: Never Used  Substance Use Topics   Alcohol use: No   Drug use: No    Family Medical History: Family History  Problem Relation Age of Onset   Cardiomyopathy Mother    Coronary artery disease Father    Hypertension Brother    Prostate cancer Brother     Exam: Today's Vitals   10/02/23 1105  BP: 132/78  PainSc: 7   PainLoc: Back   There is no height or weight on file to  calculate BMI.  General: A&O ROM of spine: WNL.  Palpation of spine: TTP over right intrascapular region.  Strength in the left lower extremity is EHL 5/5, Dorsiflexion 5/5, Plantar flexion 5/5, Hamstring 5/5, Quadricep 5/5, Iliopsoas 5/5. Strength in the right lower extremity is EHL 5/5, Dorsiflexion 5/5, Plantar flexion 5/5, Hamstring 5/5, Quadricep 5/5, Iliopsoas 5/5. Reflexes are 1+ and symmetric at the patella and achilles.   Bilateral lower extremity sensation is intact to light touch.  Ambulates with a slowed but steady gait with the assistance of a cane  Imaging: 09/28/23 Thoracic and lumbar xrays.  There does not appear to be any acute fracture on thoracic x-rays.  Lumbar x-rays show interval T12, L1, and L2 kyphoplasties with a chronic L3 compression deformity.  There does not appear to be any other acute abnormalities. Formal radiology report pending.  Assessment and Plan: Ms. Sakurai is a pleasant 78 y.o. female with a little over 1 month of low back pain and a known history of compression fractures which improved significantly with her recent kyphoplasty and about 2 and half weeks of right interscapular pain.  She is tender to palpation in this area we did discuss a differential diagnosis of underlying myofascial pain versus potential for an early onset of cervical radiculopathy.  She would like to attempt conservative management with physical therapy and focus on dry needling and myofascial release as well as attempting a new muscle relaxer.  I have given her a prescription for Flexeril  10 mg  to take as needed.  We discussed medication side effects.  She would like to continue gabapentin  600 twice daily as prescribed by her primary care provider.  Also encouraged her to try Celebrex as this been prescribed her to past and she still has a prescription for this.  I placed a new order for DEXA scan as she has not been able to complete this yet.  I will see her back in about 3 weeks to evaluate her progress with medication and physical therapy.  We did briefly discussed a cervical MRI but we will hold off on this for now.  She was encouraged to call the office in the interim with any questions or concerns.  She expressed understanding and was in agreement with this plan.   I spent a total of 45 minutes in both face-to-face and non-face-to-face activities for this visit on the date of this encounter including review of records, review of imaging, lengthy discussion regarding differential diagnosis, physical exam, order placement, and documentation  Edsel Goods PA-C Neurosurgery

## 2023-10-04 ENCOUNTER — Telehealth: Payer: Self-pay | Admitting: Neurosurgery

## 2023-10-04 NOTE — Telephone Encounter (Signed)
 Patient is calling to follow up on her DEXA scan referral. I do not see an order in her chart.

## 2023-10-04 NOTE — Telephone Encounter (Signed)
 DEXA order faxed to [facility].

## 2023-10-09 DIAGNOSIS — M810 Age-related osteoporosis without current pathological fracture: Secondary | ICD-10-CM | POA: Diagnosis not present

## 2023-10-12 DIAGNOSIS — M7061 Trochanteric bursitis, right hip: Secondary | ICD-10-CM | POA: Diagnosis not present

## 2023-10-18 DIAGNOSIS — M6281 Muscle weakness (generalized): Secondary | ICD-10-CM | POA: Diagnosis not present

## 2023-10-18 DIAGNOSIS — R2689 Other abnormalities of gait and mobility: Secondary | ICD-10-CM | POA: Diagnosis not present

## 2023-10-18 DIAGNOSIS — M5459 Other low back pain: Secondary | ICD-10-CM | POA: Diagnosis not present

## 2023-10-23 DIAGNOSIS — M6281 Muscle weakness (generalized): Secondary | ICD-10-CM | POA: Diagnosis not present

## 2023-10-23 DIAGNOSIS — M5459 Other low back pain: Secondary | ICD-10-CM | POA: Diagnosis not present

## 2023-10-23 DIAGNOSIS — R2689 Other abnormalities of gait and mobility: Secondary | ICD-10-CM | POA: Diagnosis not present

## 2023-10-25 ENCOUNTER — Ambulatory Visit: Payer: Medicare Other | Admitting: Neurosurgery

## 2023-10-25 VITALS — BP 130/80 | Ht 67.0 in | Wt 172.6 lb

## 2023-10-25 DIAGNOSIS — S22080K Wedge compression fracture of T11-T12 vertebra, subsequent encounter for fracture with nonunion: Secondary | ICD-10-CM

## 2023-10-25 DIAGNOSIS — S32020K Wedge compression fracture of second lumbar vertebra, subsequent encounter for fracture with nonunion: Secondary | ICD-10-CM | POA: Diagnosis not present

## 2023-10-25 DIAGNOSIS — M8000XA Age-related osteoporosis with current pathological fracture, unspecified site, initial encounter for fracture: Secondary | ICD-10-CM | POA: Diagnosis not present

## 2023-10-25 DIAGNOSIS — S32020S Wedge compression fracture of second lumbar vertebra, sequela: Secondary | ICD-10-CM

## 2023-10-25 NOTE — Progress Notes (Signed)
Follow-up note: Referring Physician:  Collene Mares, PA [address] [place],  [place] [address]  Primary Physician:  Hyacinth Meeker, [place], [place]  Chief Complaint:  f/u for compression fracture  History of Present Illness: 10/25/23 Amanda Lambert presents today to review her DEXA results and discuss her right interscapular pain and his response to dry needling and Flexeril. Today she reports resolution of the intrascapular pain she was having at her last visit.  She took a few doses of Flexeril and this resolved.  She did not end up going to physical therapy for dry needling.  While she does have some "tiredness" in her low back she denies any significant pain.   10/02/23 Amanda Lambert is a 78 y.o. female who presents today for hospital follow-up of compression fracture. She was seen in the hospital about 4 weeks ago with worsening low back pain and was found to have multiple thoracolumbar compression fractures.  She underwent kyphoplasty on 09/05/2023 at T12, L1, and L3.  She states that she had some flank pain after this however she presented to the ER on 09/15/2023 and was diagnosed with a UTI.  The symptoms have since resolved.  Her current complaint is pain in her right intrascapular region that is worse with laying flat.  She has been taking gabapentin 600 mg twice daily which does provide some relief.  She denies any pain that radiates down the length of her arm.  She has also tried Robaxin and over-the-counter medications without any significant relief.  08/31/2023 Amanda Lambert is here today with a chief complaint of worsening pain.   I saw her in clinic approximately 10 days ago.  She was doing well at that time.  Over the past several days, she has had worsening back pain.  She presented with incapacitating pain.  Workup revealed a worsening fracture 2 levels above her previously identified fracture.  She denies any other neurologic symptoms.   08/21/2023 Amanda Lambert is here today with a chief complaint of pain which precipitated workup showing an L2 compression fracture.  She has no new neurologic symptoms.  She was given an LSO brace and has been compliant with her brace.  It does help her.  Her pain is improved significantly over the past couple of weeks.  She is however still having pain.     Conservative measures:  Physical therapy: has not participated in  Multimodal medical therapy including regular antiinflammatories:  baclofen, lidocaine patches, hydrocodone,  Injections: has not received epidural steroid injections   Past Surgery: no prior spinal surgeries   Amanda Lambert has no symptoms of cervical myelopathy.   The symptoms are causing a significant impact on the patient's life.    I have utilized the care everywhere function in epic to review the outside records available from external health systems.  Review of Systems:  A 10 point review of systems is negative,  and the pertinent positives and negatives detailed in the HPI.  Past Medical History: Past Medical History:  Diagnosis Date   Anemia    Angina pectoris (HCC)    Aortic atherosclerosis (HCC)    Arthritis    Bilateral carotid artery disease (HCC) 10/15/2019   a.) carotid doppler 10/15/2019: 50-69% BICA; b.) carotid doppler 04/06/2020: 50-69% RICA, 1-15% LICA; c.) carotid doppler 11/03/2020 and 04/29/2021: 50-69% RICA, 16-49% LICA; d.) carotid doppler 03/02/2022: 50-69% BICA; e.) carotid doppler 03/14/2023: 50-69% RICA, 16-49% LICA   Biliary dyskinesia  CAD (coronary artery disease) 06/14/2021   a.) LHC 11/04/2019: 50% mLAD - med mgmt; b.) LHC/PCI 06/14/2021: 40% p-mLAD, 80% mLAD (3.0 x 30 mm Onyx Frontier DES; c.) LHC for FFR study: 07/19/2021: 40% pLAD; RFR 0.92, FFR 0.89, CFR 4.8, IMR 18 --> no significant macro/microvascular disease   CKD (chronic kidney disease), stage III (HCC)    Complication of anesthesia    a.) PONV; b.) delayed emergence   Constipation     Diastolic dysfunction 10/15/2019   a.) TTE 10/15/2019: EF 50-55%, norm LAP, mild LA dil, mod MR, mild-mod TR, G1DD; b.) TTE 05/19/2021: EF 50-55%, mild basal sep hypertrophy, norm LAP, triv MR/TR, G1DD   GERD (gastroesophageal reflux disease)    Hepatitis B 1975   Hypercholesteremia    Hypertension    Inguinal hernia    Irritable bowel syndrome without diarrhea    LBBB (left bundle branch block)    Leukopenia    Long term current use of aspirin    PONV (postoperative nausea and vomiting)    [facility] spotted fever 04/01/2012   adm. to hospital   Syncope 10/17/2019   Thrombocytopenia (HCC)    Transaminitis    Trochanteric bursitis, right hip    Vaginal Pap smear, abnormal    Varicose veins of leg with edema, bilateral    Weakness     Past Surgical History: Past Surgical History:  Procedure Laterality Date   APPENDECTOMY  09/25/1961   CHOLECYSTECTOMY  04/26/2012   Procedure: LAPAROSCOPIC CHOLECYSTECTOMY WITH INTRAOPERATIVE CHOLANGIOGRAM;  Surgeon: Robyne Askew, MD;  Location: [facility];  Service: General;  Laterality: N/A;   COLONOSCOPY     CORONARY ANGIOGRAPHY N/A 07/19/2021   Procedure: CORONARY ANGIOGRAPHY (CATH LAB);  Surgeon: Yates Decamp, MD;  Location[facility] INVASIVE CV LAB;  Service: Cardiovascular;  Laterality: N/A;   CORONARY IMAGING/OCT N/A 06/14/2021   Procedure: INTRAVASCULAR IMAGING/OCT;  Surgeon: Yates Decamp, MD;  Location: [facility] INVASIVE CV LAB;  Service: Cardiovascular;  Laterality: N/A;   CORONARY PRESSURE/FFR STUDY N/A 07/19/2021   Procedure: INTRAVASCULAR PRESSURE WIRE/FFR STUDY;  Surgeon: Yates Decamp, MD;  Location: [facility] INVASIVE CV LAB;  Service: Cardiovascular;  Laterality: N/A;   CORONARY STENT INTERVENTION N/A 06/14/2021   Procedure: CORONARY STENT INTERVENTION;  Surgeon: Yates Decamp, MD;  Location: [facility] INVASIVE CV LAB;  Service: Cardiovascular;  Laterality: N/A;   DILATION AND CURETTAGE OF UTERUS  09/25/1980   for miscarriage   DILATION AND CURETTAGE, DIAGNOSTIC  / THERAPEUTIC  09/25/1970   INSERTION OF MESH Right 05/03/2023   Procedure: INSERTION OF MESH;  Surgeon: Sung Amabile, DO;  Location: [facility] ORS;  Service: General;  Laterality: Right;   IR KYPHO EA ADDL LEVEL THORACIC OR LUMBAR  09/05/2023   IR KYPHO LUMBAR INC FX REDUCE BONE BX UNI/BIL CANNULATION INC/IMAGING  09/05/2023   IR KYPHO THORACIC WITH BONE BIOPSY  09/05/2023   LEFT HEART CATH AND CORONARY ANGIOGRAPHY N/A 11/04/2019   Procedure: LEFT HEART CATH AND CORONARY ANGIOGRAPHY;  Surgeon: Yates Decamp, MD;  Location: [facility] INVASIVE CV LAB;  Service: Cardiovascular;  Laterality: N/A;   LEFT HEART CATH AND CORONARY ANGIOGRAPHY N/A 06/14/2021   Procedure: LEFT HEART CATH AND CORONARY ANGIOGRAPHY;  Surgeon: Yates Decamp, MD;  Location: [facility] INVASIVE CV LAB;  Service: Cardiovascular;  Laterality: N/A;   TONSILLECTOMY  1964  - approximate   WRIST FRACTURE SURGERY  09/25/2006   right    Allergies: Allergies as of 10/25/2023 - Review Complete 10/25/2023  Allergen Reaction Noted   Ace inhibitors  Anaphylaxis 03/30/2012   Bee venom Anaphylaxis and Other (See Comments) 01/28/2022   Thorazine [chlorpromazine] Anaphylaxis 03/30/2012   Wasp venom Anaphylaxis 10/17/2019   Levaquin [levofloxacin in d5w] Nausea And Vomiting 08/28/2016   Shrimp [shellfish allergy] Rash and Hives 04/04/2014   Amlodipine  01/25/2021   Atropine sulfate Other (See Comments) 01/28/2022   Elemental sulfur Itching 10/13/2019   Glycopyrrolate Other (See Comments) 01/28/2022   Ramipril Other (See Comments) 01/28/2022   Sulfa antibiotics Other (See Comments) 01/28/2022   Compazine [prochlorperazine edisylate] Anxiety 03/30/2012   Latex Rash 08/28/2016    Medications: Outpatient Encounter Medications as of 10/25/2023  Medication Sig   calcitonin, salmon, (MIACALCIN/FORTICAL) 200 UNIT/ACT nasal spray Place 1 spray into alternate nostrils daily.   cyclobenzaprine (FLEXERIL) 10 MG tablet Take 1 tablet (10 mg total) by mouth 3 (three)  times daily as needed for muscle spasms.   EPINEPHrine 0.3 mg/0.3 mL IJ SOAJ injection Inject 0.3 mg into the muscle once as needed for anaphylaxis.   esomeprazole (NEXIUM) 40 MG capsule Take 40 mg by mouth in the morning.   estradiol (ESTRACE) 0.1 MG/GM vaginal cream Place 1 g vaginally 3 (three) times a week.   furosemide (LASIX) 20 MG tablet Take 1 tablet (20 mg total) by mouth as needed.   gabapentin (NEURONTIN) 300 MG capsule Take 300 mg by mouth 2 (two) times daily.   isosorbide mononitrate (IMDUR) 120 MG 24 hr tablet Take 1 tablet (120 mg total) by mouth daily.   lidocaine (LIDODERM) 5 % Place 1 patch onto the skin daily. Remove & Discard patch within 12 hours or as directed by MD   magnesium 30 MG tablet Take 30 mg by mouth 2 (two) times daily.   nitroGLYCERIN (NITROSTAT) 0.4 MG SL tablet Place 1 tablet (0.4 mg total) under the tongue every 5 (five) minutes as needed for up to 25 days for chest pain.   ondansetron (ZOFRAN-ODT) 4 MG disintegrating tablet Take 1 tablet (4 mg total) by mouth every 8 (eight) hours as needed.   oxyCODONE (OXY IR/ROXICODONE) 5 MG immediate release tablet Take 1 tablet (5 mg total) by mouth every 6 (six) hours as needed for moderate pain (pain score 4-6).   potassium chloride SA (KLOR-CON M20) 20 MEQ tablet Take 1 tablet (20 mEq total) by mouth daily.   Probiotic Product (ALIGN PO) Take by mouth.   propranolol (INDERAL) 20 MG tablet Take 1 tablet (20 mg total) by mouth 3 (three) times daily.   rosuvastatin (CRESTOR) 10 MG tablet TAKE 1 TABLET BY MOUTH EVERY DAY   sodium chloride (OCEAN) 0.65 % SOLN nasal spray Place 1 spray into both nostrils as needed for congestion.   VITAMIN D PO Take 1 capsule by mouth every other day. In the morning   [DISCONTINUED] chlordiazePOXIDE-Clidinium (LIBRAX PO) Take by mouth as needed. Before meals bid as needed   [DISCONTINUED] Cyanocobalamin (VITAMIN B-12 PO) Take 1 tablet by mouth in the morning.   No facility-administered  encounter medications on file as of 10/25/2023.    Social History: Social History   Tobacco Use   Smoking status: Never   Smokeless tobacco: Never  Vaping Use   Vaping status: Never Used  Substance Use Topics   Alcohol use: No   Drug use: No    Family Medical History: Family History  Problem Relation Age of Onset   Cardiomyopathy Mother    Coronary artery disease Father    Hypertension Brother    Prostate cancer Brother  Exam: Today's Vitals   10/25/23 1047  BP: 130/80  Weight: 78.3 kg  Height: 5\' 7"  (1.702 m)  PainSc: 0-No pain   Body mass index is 27.03 kg/m.   General: A&O ROM of spine: WNL.  Palpation of spine: TTP over right intrascapular region.  Strength in the left lower extremity is EHL 5/5, Dorsiflexion 5/5, Plantar flexion 5/5, Hamstring 5/5, Quadricep 5/5, Iliopsoas 5/5. Strength in the right lower extremity is EHL 5/5, Dorsiflexion 5/5, Plantar flexion 5/5, Hamstring 5/5, Quadricep 5/5, Iliopsoas 5/5. Reflexes are 1+ and symmetric at the patella and achilles.   Bilateral lower extremity sensation is intact to light touch.  Ambulates with a slowed but steady gait with the assistance of a cane  Imaging: 10/09/23 DEXA LUMBAR  SPINE L1- L4                   HIP L.FEM. NECK 10/09/23 0.554 -2.7                 L.TOTAL HIP 10/09/23 0.641 -2.5                FOREARM  L/R 33% 10/09/23 0.612 -1.4                 INTERPRETATION: OSTEOPOROSIS. LUMBAR SPINE NOT REPORTED DUE TO PRIOR  SURGERY.     FRACTURE RISK ASSESSMENT (FRAX)  __7.8___  10 year risk of hip fracture.      ___25__  10 year risk of any  major fracture.   TREATMENT:   The National Osteoporosis Foundation (2014) Treatment Guidelines for:  Consider FDA-approved medical therapies in postmenopausal women and men  aged 59 years and older, based on the following:  A hip or vertebral (clinical or morphometric) fracture  T-score <= -2.5 at the femoral neck or spine after appropriate  evaluation  to exclude secondary causes  Low bone mass (T-score between -1.0 and -2.5 at the femoral neck or spine)  and a 10-year probability of a hip fracture >= 3% or a 10-year probability  of a major osteoporosis-related fracture >= 20% based on the UA-adapted  WHO algorithm  Clinicians judgment and/or patient preferences may indicate treatment for  people with 10-year fracture probabilities above or below these levels  BMD should be monitored two years after initiating therapy and at two-year  intervals thereafter.   09/28/23 Thoracic and lumbar xrays.  There does not appear to be any acute fracture on thoracic x-rays.  Lumbar x-rays show interval T12, L1, and L2 kyphoplasties with a chronic L3 compression deformity.  There does not appear to be any other acute abnormalities. Formal radiology report pending.  Assessment and Plan: Amanda Lambert is a pleasant 78 y.o. female with a history of multiple vertebral fractures and kyphoplasties.  I am encouraged that her previous intrascapular pain has resolved and that her back pain is improving.  I will see her going forward on an as-needed basis in regards to her fractures.  I did reach out to her primary care provider in regards to her DEXA results which do show a diagnosis of osteoporosis.  If they are uncomfortable with starting medications for this, we discussed referral to endocrinology as she would likely benefit from treatment of her osteoporosis.  In the meantime I encouraged her to start calcium and vitamin D supplements.  I spent a total of 30 minutes in both face-to-face and non-face-to-face activities for this visit on the date of this encounter including review of records, review of  studies, provider to provider discussion and documentation  [place] [facility]

## 2023-10-26 ENCOUNTER — Other Ambulatory Visit: Payer: Self-pay | Admitting: Gastroenterology

## 2023-10-26 DIAGNOSIS — K529 Noninfective gastroenteritis and colitis, unspecified: Secondary | ICD-10-CM

## 2023-10-28 ENCOUNTER — Other Ambulatory Visit: Payer: Self-pay | Admitting: Adult Health

## 2023-10-30 DIAGNOSIS — M6281 Muscle weakness (generalized): Secondary | ICD-10-CM | POA: Diagnosis not present

## 2023-10-30 DIAGNOSIS — M5459 Other low back pain: Secondary | ICD-10-CM | POA: Diagnosis not present

## 2023-10-30 DIAGNOSIS — R2689 Other abnormalities of gait and mobility: Secondary | ICD-10-CM | POA: Diagnosis not present

## 2023-10-30 MED ORDER — FUROSEMIDE 20 MG PO TABS: 20.0000 mg | ORAL_TABLET | Freq: Every day | ORAL | 3 refills | Status: DC | PRN

## 2023-10-30 NOTE — Addendum Note (Signed)
Addended by: Adriana Simas, Ercelle Winkles L on: 10/30/2023 04:20 PM   Modules accepted: Orders

## 2023-11-01 DIAGNOSIS — M6281 Muscle weakness (generalized): Secondary | ICD-10-CM | POA: Diagnosis not present

## 2023-11-01 DIAGNOSIS — R2689 Other abnormalities of gait and mobility: Secondary | ICD-10-CM | POA: Diagnosis not present

## 2023-11-01 DIAGNOSIS — M5459 Other low back pain: Secondary | ICD-10-CM | POA: Diagnosis not present

## 2023-11-08 DIAGNOSIS — R2689 Other abnormalities of gait and mobility: Secondary | ICD-10-CM | POA: Diagnosis not present

## 2023-11-08 DIAGNOSIS — M6281 Muscle weakness (generalized): Secondary | ICD-10-CM | POA: Diagnosis not present

## 2023-11-08 DIAGNOSIS — M5459 Other low back pain: Secondary | ICD-10-CM | POA: Diagnosis not present

## 2023-11-09 ENCOUNTER — Telehealth: Payer: Self-pay | Admitting: *Deleted

## 2023-11-09 NOTE — Telephone Encounter (Signed)
   Patient Name: Amanda Lambert  DOB: Jun 27, 1946 MRN: [id number]  Primary Cardiologist: None  Chart reviewed as part of pre-operative protocol coverage.   Simple dental extractions (i.e. 1-2 teeth) are considered low risk procedures per guidelines and generally do not require any specific cardiac clearance. It is also generally accepted that for simple extractions and dental cleanings, there is no need to interrupt blood thinner therapy.  SBE prophylaxis is not required for the patient from a cardiac standpoint.  I will route this recommendation to the requesting party via Epic fax function and remove from pre-op pool.  Please call with questions.  Napoleon Form, Leodis Rains, NP 11/09/2023, 4:27 PM

## 2023-11-09 NOTE — Telephone Encounter (Signed)
   Pre-operative Risk Assessment    Patient Name: Amanda Lambert  DOB: November 16, 1945 MRN: [id number]   Date of last office visit: 08/03/23 Joni Reining, DNP Date of next office visit: NONE  Request for Surgical Clearance    Procedure:   PT WILL BE HAVING 4 FILLINGS ONLY AT THIS TIME.   Date of Surgery:  Clearance TBD                                Surgeon:  DR. [place] THOMAS  Surgeon's Group or Practice Name:  [facility]  Phone number:  [phone] Fax number:  [phone]   Type of Clearance Requested:   - Medical ; NONE INDICATED ON FORM TO BE HELD   Type of Anesthesia:  Local  (WITH EPI)   Additional requests/questions:    [name]   11/09/2023, 4:05 PM

## 2023-11-13 DIAGNOSIS — M6281 Muscle weakness (generalized): Secondary | ICD-10-CM | POA: Diagnosis not present

## 2023-11-13 DIAGNOSIS — C4441 Basal cell carcinoma of skin of scalp and neck: Secondary | ICD-10-CM | POA: Diagnosis not present

## 2023-11-13 DIAGNOSIS — M5459 Other low back pain: Secondary | ICD-10-CM | POA: Diagnosis not present

## 2023-11-13 DIAGNOSIS — L814 Other melanin hyperpigmentation: Secondary | ICD-10-CM | POA: Diagnosis not present

## 2023-11-13 DIAGNOSIS — D485 Neoplasm of uncertain behavior of skin: Secondary | ICD-10-CM | POA: Diagnosis not present

## 2023-11-13 DIAGNOSIS — R2689 Other abnormalities of gait and mobility: Secondary | ICD-10-CM | POA: Diagnosis not present

## 2023-11-20 DIAGNOSIS — M6281 Muscle weakness (generalized): Secondary | ICD-10-CM | POA: Diagnosis not present

## 2023-11-20 DIAGNOSIS — R2689 Other abnormalities of gait and mobility: Secondary | ICD-10-CM | POA: Diagnosis not present

## 2023-11-20 DIAGNOSIS — M5459 Other low back pain: Secondary | ICD-10-CM | POA: Diagnosis not present

## 2023-11-21 DIAGNOSIS — C44519 Basal cell carcinoma of skin of other part of trunk: Secondary | ICD-10-CM | POA: Diagnosis not present

## 2023-11-21 DIAGNOSIS — L565 Disseminated superficial actinic porokeratosis (DSAP): Secondary | ICD-10-CM | POA: Diagnosis not present

## 2023-11-21 DIAGNOSIS — D485 Neoplasm of uncertain behavior of skin: Secondary | ICD-10-CM | POA: Diagnosis not present

## 2023-11-21 DIAGNOSIS — C44619 Basal cell carcinoma of skin of left upper limb, including shoulder: Secondary | ICD-10-CM | POA: Diagnosis not present

## 2023-12-13 DIAGNOSIS — C44619 Basal cell carcinoma of skin of left upper limb, including shoulder: Secondary | ICD-10-CM | POA: Diagnosis not present

## 2023-12-13 DIAGNOSIS — C44519 Basal cell carcinoma of skin of other part of trunk: Secondary | ICD-10-CM | POA: Diagnosis not present

## 2024-01-10 ENCOUNTER — Telehealth: Payer: Self-pay | Admitting: *Deleted

## 2024-01-10 NOTE — Telephone Encounter (Signed)
 Dental  office called back and stated that pt will have at least 5 teeth for extraction.

## 2024-01-10 NOTE — Telephone Encounter (Signed)
   Pre-operative Risk Assessment    Patient Name: Amanda Lambert  DOB: 08/23/1946 MRN: [id number]   Date of last office visit: 08/03/23 Friddie Jetty, DNP Date of next office visit: NONE  Request for Surgical Clearance    Procedure:  Dental Extraction - Amount of Teeth to be Pulled:  EXTRACTION OF  MULTIPLE TEETH (LEFT MESSAGE TO CALL BACK WITH HOW MANY FOR EXTRACTION)  Date of Surgery:  Clearance TBD (URGENT)                               Surgeon:  DR. Betsey Brow, DM Surgeon's Group or Practice Name:  [facility]  Phone number:  [phone] Fax number:  [phone]   Type of Clearance Requested:   - Medical ; NONE INDICATED ON FORM TO BE HELD   Type of Anesthesia:  General    Additional requests/questions:    Princeton Broom   01/10/2024, 10:32 AM

## 2024-01-10 NOTE — Telephone Encounter (Signed)
 Pt calling to set up tele appt. Please advise

## 2024-01-10 NOTE — Telephone Encounter (Signed)
   Name: Amanda Lambert  DOB: May 08, 1946  MRN: [id number]  Primary Cardiologist: None   Preoperative team, please contact this patient and set up a phone call appointment for further preoperative risk assessment. Please obtain consent and complete medication review. Thank you for your help.  I confirm that guidance regarding antiplatelet and oral anticoagulation therapy has been completed and, if necessary, noted below.  None  I also confirmed the patient resides in the state of [place] . As per [facility] Medical Board telemedicine laws, the patient must reside in the state in which the provider is licensed.   Francene Ing, Retha Cast, NP 01/10/2024, 10:50 AM [facility]

## 2024-01-10 NOTE — Telephone Encounter (Signed)
 Left a message to call back to schedule a tele pre op appt.

## 2024-01-11 ENCOUNTER — Telehealth: Payer: Self-pay

## 2024-01-11 NOTE — Telephone Encounter (Signed)
 Called patient to schedule a Tele visit on 01/14/24 @2 :00 for a pre-op clearance (Dental Extraction - Amount of Teeth to be Pulled:  EXTRACTION OF  MULTIPLE TEETH (LEFT MESSAGE TO CALL BACK WITH HOW MANY FOR EXTRACTION) meds rec and consent done.     Patient Consent for Virtual Visit        Amanda Lambert has provided verbal consent on 01/11/2024 for a virtual visit (video or telephone).   CONSENT FOR VIRTUAL VISIT FOR:  Amanda Lambert  By participating in this virtual visit I agree to the following:  I hereby voluntarily request, consent and authorize [facility] and its employed or contracted physicians, physician assistants, nurse practitioners or other licensed health care professionals (the Practitioner), to provide me with telemedicine health care services (the "Services) as deemed necessary by the treating Practitioner. I acknowledge and consent to receive the Services by the Practitioner via telemedicine. I understand that the telemedicine visit will involve communicating with the Practitioner through live audiovisual communication technology and the disclosure of certain medical information by electronic transmission. I acknowledge that I have been given the opportunity to request an in-person assessment or other available alternative prior to the telemedicine visit and am voluntarily participating in the telemedicine visit.  I understand that I have the right to withhold or withdraw my consent to the use of telemedicine in the course of my care at any time, without affecting my right to future care or treatment, and that the Practitioner or I may terminate the telemedicine visit at any time. I understand that I have the right to inspect all information obtained and/or recorded in the course of the telemedicine visit and may receive copies of available information for a reasonable fee.  I understand that some of the potential risks of receiving the Services via telemedicine  include:  Delay or interruption in medical evaluation due to technological equipment failure or disruption; Information transmitted may not be sufficient (e.g. poor resolution of images) to allow for appropriate medical decision making by the Practitioner; and/or  In rare instances, security protocols could fail, causing a breach of personal health information.  Furthermore, I acknowledge that it is my responsibility to provide information about my medical history, conditions and care that is complete and accurate to the best of my ability. I acknowledge that Practitioner's advice, recommendations, and/or decision may be based on factors not within their control, such as incomplete or inaccurate data provided by me or distortions of diagnostic images or specimens that may result from electronic transmissions. I understand that the practice of medicine is not an exact science and that Practitioner makes no warranties or guarantees regarding treatment outcomes. I acknowledge that a copy of this consent can be made available to me via my patient portal [facility] MyChart), or I can request a printed copy by calling the office of [facility].    I understand that my insurance will be billed for this visit.   I have read or had this consent read to me. I understand the contents of this consent, which adequately explains the benefits and risks of the Services being provided via telemedicine.  I have been provided ample opportunity to ask questions regarding this consent and the Services and have had my questions answered to my satisfaction. I give my informed consent for the services to be provided through the use of telemedicine in my medical care

## 2024-01-11 NOTE — Telephone Encounter (Signed)
 Called patient to schedule a Tele visit on 01/14/24 @2 :00 for a pre-op clearance (Dental Extraction - Amount of Teeth to be Pulled:  EXTRACTION OF  MULTIPLE TEETH (LEFT MESSAGE TO CALL BACK WITH HOW MANY FOR EXTRACTION) meds rec and consent done.

## 2024-01-14 ENCOUNTER — Encounter: Payer: Self-pay | Admitting: Emergency Medicine

## 2024-01-14 ENCOUNTER — Ambulatory Visit
Admission: EM | Admit: 2024-01-14 | Discharge: 2024-01-14 | Disposition: A | Discharge status: Home / Self Care | Attending: Emergency Medicine | Admitting: Emergency Medicine

## 2024-01-14 ENCOUNTER — Other Ambulatory Visit: Payer: Self-pay

## 2024-01-14 ENCOUNTER — Ambulatory Visit (INDEPENDENT_AMBULATORY_CARE_PROVIDER_SITE_OTHER): Admitting: Nurse Practitioner

## 2024-01-14 DIAGNOSIS — Z01810 Encounter for preprocedural cardiovascular examination: Secondary | ICD-10-CM

## 2024-01-14 DIAGNOSIS — N3000 Acute cystitis without hematuria: Secondary | ICD-10-CM | POA: Insufficient documentation

## 2024-01-14 LAB — POCT URINALYSIS DIP (MANUAL ENTRY)
Glucose, UA: 250 mg/dL — AB
Nitrite, UA: POSITIVE — AB
Protein Ur, POC: >=300 mg/dL — AB
Spec Grav, UA: <=1.005 — AB
Urobilinogen, UA: >=8 U/dL — AB
pH, UA: 5

## 2024-01-14 MED ORDER — CEPHALEXIN 500 MG PO CAPS
500.0000 mg | ORAL_CAPSULE | Freq: Three times a day (TID) | ORAL | 0 refills | Status: AC
Start: 2024-01-14 — End: 2024-01-19

## 2024-01-14 NOTE — ED Triage Notes (Signed)
 Patient presents to [facility] for evaluation of less than 24 hours of burning with urination, frequency and urgency.  Denies fever or chills.  Has chronic back pain, so unsure if she has any new at this time.  Denies blood in urine last night.  Took AZO

## 2024-01-14 NOTE — Discharge Instructions (Addendum)
 Your urinalysis shows Amanda Lambert blood cells and nitrates which are indicative of infection, your urine will be sent to the lab to determine exactly which bacteria is present, if any changes need to be made to your medications you will be notified  Begin use of Keflex  every 8 hours for 5 days  You may use over-the-counter Azo to help minimize your symptoms until antibiotic removes bacteria, this medication will turn your urine orange  Increase your fluid intake through use of water  As always practice good hygiene, wiping front to back and avoidance of scented vaginal products to prevent further irritation  If symptoms continue to persist after use of medication or recur please follow-up with urgent care or your primary doctor as needed

## 2024-01-14 NOTE — ED Provider Notes (Signed)
 Amanda Lambert    CSN: [id number] Arrival date & time: 01/14/24  1518      History   Chief Complaint Chief Complaint  Patient presents with   Dysuria         HPI Amanda Lambert is a 78 y.o. female.   Patient presents for evaluation of urinary frequency, urgency and dysuria beginning yesterday evening.  Has attempted use of Azo which has been somewhat helpful.  Denies hematuria, back pain, abdominal pain or fever.  Past Medical History:  Diagnosis Date   Anemia    Angina pectoris (HCC)    Aortic atherosclerosis (HCC)    Arthritis    Bilateral carotid artery disease (HCC) 10/15/2019   a.) carotid doppler 10/15/2019: 50-69% BICA; b.) carotid doppler 04/06/2020: 50-69% RICA, 1-15% LICA; c.) carotid doppler 11/03/2020 and 04/29/2021: 50-69% RICA, 16-49% LICA; d.) carotid doppler 03/02/2022: 50-69% BICA; e.) carotid doppler 03/14/2023: 50-69% RICA, 16-49% LICA   Biliary dyskinesia    CAD (coronary artery disease) 06/14/2021   a.) LHC 11/04/2019: 50% mLAD - med mgmt; b.) LHC/PCI 06/14/2021: 40% p-mLAD, 80% mLAD (3.0 x 30 mm Onyx Frontier DES; c.) LHC for FFR study: 07/19/2021: 40% pLAD; RFR 0.92, FFR 0.89, CFR 4.8, IMR 18 --> no significant macro/microvascular disease   CKD (chronic kidney disease), stage III (HCC)    Complication of anesthesia    a.) PONV; b.) delayed emergence   Constipation    Diastolic dysfunction 10/15/2019   a.) TTE 10/15/2019: EF 50-55%, norm LAP, mild LA dil, mod MR, mild-mod TR, G1DD; b.) TTE 05/19/2021: EF 50-55%, mild basal sep hypertrophy, norm LAP, triv MR/TR, G1DD   GERD (gastroesophageal reflux disease)    Hepatitis B 1975   Hypercholesteremia    Hypertension    Inguinal hernia    Irritable bowel syndrome without diarrhea    LBBB (left bundle branch block)    Leukopenia    Long term current use of aspirin     PONV (postoperative nausea and vomiting)    [facility] spotted fever 04/01/2012   adm. to hospital   Syncope 10/17/2019    Thrombocytopenia (HCC)    Transaminitis    Trochanteric bursitis, right hip    Vaginal Pap smear, abnormal    Varicose veins of leg with edema, bilateral    Weakness     Patient Active Problem List   Diagnosis Date Noted   Compression fracture of L2 lumbar vertebra, open, initial encounter (HCC) 08/31/2023   Compression fracture of T12 vertebra (HCC) 08/31/2023   Lumbar compression fracture (HCC) 08/30/2023   CHF (congestive heart failure) (HCC) 04/17/2023   Coronary artery disease of native artery of native heart with stable angina pectoris (HCC) 07/04/2021   Hypercholesteremia 07/04/2021   LAD stenosis 06/13/2021   Angina pectoris (HCC) 11/03/2019   Biliary dyskinesia 04/09/2012   Transaminitis 04/01/2012   Lower urinary tract infectious disease 04/01/2012   HTN (hypertension) 04/01/2012   GERD (gastroesophageal reflux disease) 04/01/2012    Past Surgical History:  Procedure Laterality Date   APPENDECTOMY  09/25/1961   CHOLECYSTECTOMY  04/26/2012   Procedure: LAPAROSCOPIC CHOLECYSTECTOMY WITH INTRAOPERATIVE CHOLANGIOGRAM;  Surgeon: Mayme Spearman, MD;  Location: [facility];  Service: General;  Laterality: N/A;   COLONOSCOPY     CORONARY ANGIOGRAPHY N/A 07/19/2021   Procedure: CORONARY ANGIOGRAPHY (CATH LAB);  Surgeon: Knox Perl, MD;  Location: [facility] INVASIVE CV LAB;  Service: Cardiovascular;  Laterality: N/A;   CORONARY IMAGING/OCT N/A 06/14/2021   Procedure: INTRAVASCULAR IMAGING/OCT;  Surgeon:  Knox Perl, MD;  Location: [facility] INVASIVE CV LAB;  Service: Cardiovascular;  Laterality: N/A;   CORONARY PRESSURE/FFR STUDY N/A 07/19/2021   Procedure: INTRAVASCULAR PRESSURE WIRE/FFR STUDY;  Surgeon: Knox Perl, MD;  Location: [facility] INVASIVE CV LAB;  Service: Cardiovascular;  Laterality: N/A;   CORONARY STENT INTERVENTION N/A 06/14/2021   Procedure: CORONARY STENT INTERVENTION;  Surgeon: Knox Perl, MD;  Location: [facility] INVASIVE CV LAB;  Service: Cardiovascular;  Laterality: N/A;   DILATION AND  CURETTAGE OF UTERUS  09/25/1980   for miscarriage   DILATION AND CURETTAGE, DIAGNOSTIC / THERAPEUTIC  09/25/1970   INSERTION OF MESH Right 05/03/2023   Procedure: INSERTION OF MESH;  Surgeon: Conrado Delay, DO;  Location: [facility] ORS;  Service: General;  Laterality: Right;   IR KYPHO EA ADDL LEVEL THORACIC OR LUMBAR  09/05/2023   IR KYPHO LUMBAR INC FX REDUCE BONE BX UNI/BIL CANNULATION INC/IMAGING  09/05/2023   IR KYPHO THORACIC WITH BONE BIOPSY  09/05/2023   LEFT HEART CATH AND CORONARY ANGIOGRAPHY N/A 11/04/2019   Procedure: LEFT HEART CATH AND CORONARY ANGIOGRAPHY;  Surgeon: Knox Perl, MD;  Location: [facility] INVASIVE CV LAB;  Service: Cardiovascular;  Laterality: N/A;   LEFT HEART CATH AND CORONARY ANGIOGRAPHY N/A 06/14/2021   Procedure: LEFT HEART CATH AND CORONARY ANGIOGRAPHY;  Surgeon: Knox Perl, MD;  Location: [facility] INVASIVE CV LAB;  Service: Cardiovascular;  Laterality: N/A;   TONSILLECTOMY  1964  - approximate   WRIST FRACTURE SURGERY  09/25/2006   right    OB History     Gravida  2   Para      Term      Preterm      AB  1   Living  1      SAB  1   IAB      Ectopic      Multiple      Live Births  1            Home Medications    Prior to Admission medications   Medication Sig Start Date End Date Taking? Authorizing Provider  cephALEXin  (KEFLEX ) 500 MG capsule Take 1 capsule (500 mg total) by mouth 3 (three) times daily for 5 days. 01/14/24 01/19/24 Yes Isolde Skaff R, NP  calcitonin, salmon, (MIACALCIN /FORTICAL) 200 UNIT/ACT nasal spray Place 1 spray into alternate nostrils daily. 09/03/23   Lorita Rosa, MD  cyclobenzaprine  (FLEXERIL ) 10 MG tablet Take 1 tablet (10 mg total) by mouth 3 (three) times daily as needed for muscle spasms. 10/02/23   Noble Bateman, PA  EPINEPHrine  0.3 mg/0.3 mL IJ SOAJ injection Inject 0.3 mg into the muscle once as needed for anaphylaxis.    [provider]  esomeprazole (NEXIUM) 40 MG capsule Take 40 mg by mouth in  the morning.    [provider]  estradiol  (ESTRACE ) 0.1 MG/GM vaginal cream Place 1 g vaginally 3 (three) times a week. 07/02/23   Lacey Pian, MD  furosemide  (LASIX ) 20 MG tablet Take 1 tablet (20 mg total) by mouth daily as needed. 10/30/23   Knox Perl, MD  gabapentin  (NEURONTIN ) 300 MG capsule Take 300 mg by mouth 2 (two) times daily. 02/14/21   [provider]  isosorbide  mononitrate (IMDUR ) 120 MG 24 hr tablet Take 1 tablet (120 mg total) by mouth daily. 08/03/23   Tania Familia, NP  lidocaine  (LIDODERM ) 5 % Place 1 patch onto the skin daily. Remove & Discard patch within 12 hours or as directed by MD 09/02/23  Lorita Rosa, MD  magnesium 30 MG tablet Take 30 mg by mouth 2 (two) times daily.    [provider]  nitroGLYCERIN  (NITROSTAT ) 0.4 MG SL tablet Place 1 tablet (0.4 mg total) under the tongue every 5 (five) minutes as needed for up to 25 days for chest pain. 08/03/23 10/02/23  Tania Familia, NP  ondansetron  (ZOFRAN -ODT) 4 MG disintegrating tablet Take 1 tablet (4 mg total) by mouth every 8 (eight) hours as needed. 09/15/23   Lessa Huge, Maybelle Spatz, NP  oxyCODONE  (OXY IR/ROXICODONE ) 5 MG immediate release tablet Take 1 tablet (5 mg total) by mouth every 6 (six) hours as needed for moderate pain (pain score 4-6). Patient not taking: Reported on 01/11/2024 09/02/23   Lorita Rosa, MD  potassium chloride  SA (KLOR-CON  M20) 20 MEQ tablet TAKE 1 TABLET BY MOUTH EVERY DAY 10/30/23   Knox Perl, MD  Probiotic Product (ALIGN PO) Take by mouth.    [provider]  propranolol  (INDERAL ) 20 MG tablet Take 1 tablet (20 mg total) by mouth 3 (three) times daily. 07/16/23   Knox Perl, MD  rosuvastatin  (CRESTOR ) 10 MG tablet TAKE 1 TABLET BY MOUTH EVERY DAY 06/29/23   Knox Perl, MD  sodium chloride  (OCEAN) 0.65 % SOLN nasal spray Place 1 spray into both nostrils as needed for congestion.    [provider]  VITAMIN D  PO Take 1 capsule by mouth every other  day. In the morning    [provider]    Family History Family History  Problem Relation Age of Onset   Cardiomyopathy Mother    Coronary artery disease Father    Hypertension Brother    Prostate cancer Brother     Social History Social History   Tobacco Use   Smoking status: Never   Smokeless tobacco: Never  Vaping Use   Vaping status: Never Used  Substance Use Topics   Alcohol use: No   Drug use: No     Allergies   Ace inhibitors, Bee venom, Thorazine [chlorpromazine], Wasp venom, Levaquin  [levofloxacin  in d5w], Shrimp [shellfish allergy], Amlodipine , Atropine sulfate, Elemental sulfur, Glycopyrrolate , Ramipril, Sulfa antibiotics, Compazine [prochlorperazine edisylate], and Latex   Review of Systems Review of Systems   Physical Exam Triage Vital Signs ED Triage Vitals [01/14/24 1603]  Encounter Vitals Group     BP (!) 144/74     Systolic BP Percentile      Diastolic BP Percentile      Pulse Rate 73     Resp 18     Temp (!) 96.9 F (36.1 C)     Temp Source Temporal     SpO2 93 %     Weight      Height      Head Circumference      Peak Flow      Pain Score      Pain Loc      Pain Education      Exclude from Growth Chart    No data found.  Updated Vital Signs BP (!) 144/74 (BP Location: Left Arm)   Pulse 73   Temp (!) 96.9 F (36.1 C) (Temporal)   Resp 18   SpO2 93%   Visual Acuity Right Eye Distance:   Left Eye Distance:   Bilateral Distance:    Right Eye Near:   Left Eye Near:    Bilateral Near:     Physical Exam Constitutional:      Appearance: Normal appearance.  Eyes:  Extraocular Movements: Extraocular movements intact.  Pulmonary:     Effort: Pulmonary effort is normal.  Abdominal:     Tenderness: There is no abdominal tenderness. There is no right CVA tenderness, left CVA tenderness or guarding.  Neurological:     Mental Status: She is alert and oriented to person, place, and time.      UC Treatments /  Results  Labs (all labs ordered are listed, but only abnormal results are displayed) Labs Reviewed  POCT URINALYSIS DIP (MANUAL ENTRY) - Abnormal; Notable for the following components:      Result Value   Color, UA orange (*)    Glucose, UA =250 (*)    Bilirubin, UA large (*)    Ketones, POC UA moderate (40) (*)    Spec Grav, UA <=1.005 (*)    Blood, UA trace-intact (*)    Protein Ur, POC >=300 (*)    Urobilinogen, UA >=8.0 (*)    Nitrite, UA Positive (*)    Leukocytes, UA Large (3+) (*)    All other components within normal limits  URINE CULTURE    EKG   Radiology No results found.  Procedures Procedures (including critical care time)  Medications Ordered in UC Medications - No data to display  Initial Impression / Assessment and Plan / UC Course  I have reviewed the triage vital signs and the nursing notes.  Pertinent labs & imaging results that were available during my care of the patient were reviewed by me and considered in my medical decision making (see chart for details).  Acute cystitis without hematuria  Urinalysis showing leukocytes and nitrates, sent for culture, prophylactically placed on cephalexin , discussed supportive measures and advised follow-up as needed if symptoms persist or recur Final Clinical Impressions(s) / UC Diagnoses   Final diagnoses:  Acute cystitis without hematuria     Discharge Instructions      Your urinalysis shows Molleigh Huot blood cells and nitrates which are indicative of infection, your urine will be sent to the lab to determine exactly which bacteria is present, if any changes need to be made to your medications you will be notified  Begin use of Keflex  every 8 hours for 5 days  You may use over-the-counter Azo to help minimize your symptoms until antibiotic removes bacteria, this medication will turn your urine orange  Increase your fluid intake through use of water  As always practice good hygiene, wiping front to back  and avoidance of scented vaginal products to prevent further irritation  If symptoms continue to persist after use of medication or recur please follow-up with urgent care or your primary doctor as needed    ED Prescriptions     Medication Sig Dispense Auth. Provider   cephALEXin  (KEFLEX ) 500 MG capsule Take 1 capsule (500 mg total) by mouth 3 (three) times daily for 5 days. 15 capsule Amanda Lambert R, NP      PDMP not reviewed this encounter.   Reena Canning, NP 01/14/24 1616

## 2024-01-14 NOTE — Progress Notes (Signed)
 Virtual Visit via Telephone Note   Because of [employer] co-morbid illnesses, she is at least at moderate risk for complications without adequate follow up.  This format is felt to be most appropriate for this patient at this time.  Due to technical limitations with video connection (technology), today's appointment will be conducted as an audio only telehealth visit, and [employer] verbally agreed to proceed in this manner.   All issues noted in this document were discussed and addressed.  No physical exam could be performed with this format.  Evaluation Performed:  Preoperative cardiovascular risk assessment _____________   Date:  01/14/2024   Patient ID:  Amanda Lambert, DOB 08-28-1946, MRN [id number] Patient Location:  Home Provider location:   Office  Primary Care Provider:  Annabell Key, [place]  E, PA Primary Cardiologist:  Knox Perl, MD  Chief Complaint / Patient Profile   78 y.o. y/o female with a h/o CAD s/p stenting-LAD in 2022 with microvascular disease, chronic LBBB, chronic bilateral lower extremity edema, hypertension, and hyperlipidemia, who is pending dental extraction (5 teeth) with Dr. Betsey Brow of [facility] and presents today for telephonic preoperative cardiovascular risk assessment.  History of Present Illness    Amanda Lambert is a 78 y.o. female who presents via audio/video conferencing for a telehealth visit today. Pt was last seen in cardiology clinic on 08/03/2023 by Friddie Jetty, NP. At that time [facility] was stable from a cardiac standpoint. The patient is now pending procedure as outlined above. Since her last visit, she has been stable from a cardiac standpoint.  She notes rare intermittent chest discomfort, unchanged from prior visits.  Her activity is somewhat limited in the setting of chronic back pain.  She denies palpitations, dyspnea, pnd, orthopnea, n, v, dizziness, syncope, edema, weight gain, or early  satiety. All other systems reviewed and are otherwise negative except as noted above.   Past Medical History    Past Medical History:  Diagnosis Date   Anemia    Angina pectoris (HCC)    Aortic atherosclerosis (HCC)    Arthritis    Bilateral carotid artery disease (HCC) 10/15/2019   a.) carotid doppler 10/15/2019: 50-69% BICA; b.) carotid doppler 04/06/2020: 50-69% RICA, 1-15% LICA; c.) carotid doppler 11/03/2020 and 04/29/2021: 50-69% RICA, 16-49% LICA; d.) carotid doppler 03/02/2022: 50-69% BICA; e.) carotid doppler 03/14/2023: 50-69% RICA, 16-49% LICA   Biliary dyskinesia    CAD (coronary artery disease) 06/14/2021   a.) LHC 11/04/2019: 50% mLAD - med mgmt; b.) LHC/PCI 06/14/2021: 40% p-mLAD, 80% mLAD (3.0 x 30 mm Onyx Frontier DES; c.) LHC for FFR study: 07/19/2021: 40% pLAD; RFR 0.92, FFR 0.89, CFR 4.8, IMR 18 --> no significant macro/microvascular disease   CKD (chronic kidney disease), stage III (HCC)    Complication of anesthesia    a.) PONV; b.) delayed emergence   Constipation    Diastolic dysfunction 10/15/2019   a.) TTE 10/15/2019: EF 50-55%, norm LAP, mild LA dil, mod MR, mild-mod TR, G1DD; b.) TTE 05/19/2021: EF 50-55%, mild basal sep hypertrophy, norm LAP, triv MR/TR, G1DD   GERD (gastroesophageal reflux disease)    Hepatitis B 1975   Hypercholesteremia    Hypertension    Inguinal hernia    Irritable bowel syndrome without diarrhea    LBBB (left bundle branch block)    Leukopenia    Long term current use of aspirin     PONV (postoperative nausea and vomiting)    [facility] spotted  fever 04/01/2012   adm. to hospital   Syncope 10/17/2019   Thrombocytopenia (HCC)    Transaminitis    Trochanteric bursitis, right hip    Vaginal Pap smear, abnormal    Varicose veins of leg with edema, bilateral    Weakness    Past Surgical History:  Procedure Laterality Date   APPENDECTOMY  09/25/1961   CHOLECYSTECTOMY  04/26/2012   Procedure: LAPAROSCOPIC CHOLECYSTECTOMY  WITH INTRAOPERATIVE CHOLANGIOGRAM;  Surgeon: Mayme Spearman, MD;  Location: [facility];  Service: General;  Laterality: N/A;   COLONOSCOPY     CORONARY ANGIOGRAPHY N/A 07/19/2021   Procedure: CORONARY ANGIOGRAPHY (CATH LAB);  Surgeon: Knox Perl, MD;  Location: [facility] INVASIVE CV LAB;  Service: Cardiovascular;  Laterality: N/A;   CORONARY IMAGING/OCT N/A 06/14/2021   Procedure: INTRAVASCULAR IMAGING/OCT;  Surgeon: Knox Perl, MD;  Location: [facility] INVASIVE CV LAB;  Service: Cardiovascular;  Laterality: N/A;   CORONARY PRESSURE/FFR STUDY N/A 07/19/2021   Procedure: INTRAVASCULAR PRESSURE WIRE/FFR STUDY;  Surgeon: Knox Perl, MD;  Location: [facility] INVASIVE CV LAB;  Service: Cardiovascular;  Laterality: N/A;   CORONARY STENT INTERVENTION N/A 06/14/2021   Procedure: CORONARY STENT INTERVENTION;  Surgeon: Knox Perl, MD;  Location: [facility] INVASIVE CV LAB;  Service: Cardiovascular;  Laterality: N/A;   DILATION AND CURETTAGE OF UTERUS  09/25/1980   for miscarriage   DILATION AND CURETTAGE, DIAGNOSTIC / THERAPEUTIC  09/25/1970   INSERTION OF MESH Right 05/03/2023   Procedure: INSERTION OF MESH;  Surgeon: Conrado Delay, DO;  Location: [facility] ORS;  Service: General;  Laterality: Right;   IR KYPHO EA ADDL LEVEL THORACIC OR LUMBAR  09/05/2023   IR KYPHO LUMBAR INC FX REDUCE BONE BX UNI/BIL CANNULATION INC/IMAGING  09/05/2023   IR KYPHO THORACIC WITH BONE BIOPSY  09/05/2023   LEFT HEART CATH AND CORONARY ANGIOGRAPHY N/A 11/04/2019   Procedure: LEFT HEART CATH AND CORONARY ANGIOGRAPHY;  Surgeon: Knox Perl, MD;  Location: [facility] INVASIVE CV LAB;  Service: Cardiovascular;  Laterality: N/A;   LEFT HEART CATH AND CORONARY ANGIOGRAPHY N/A 06/14/2021   Procedure: LEFT HEART CATH AND CORONARY ANGIOGRAPHY;  Surgeon: Knox Perl, MD;  Location: [facility] INVASIVE CV LAB;  Service: Cardiovascular;  Laterality: N/A;   TONSILLECTOMY  1964  - approximate   WRIST FRACTURE SURGERY  09/25/2006   right    Allergies  Allergies  Allergen Reactions    Ace Inhibitors Anaphylaxis    Angioedema.   Bee Venom Anaphylaxis and Other (See Comments)   Thorazine [Chlorpromazine] Anaphylaxis   Wasp Venom Anaphylaxis   Levaquin  [Levofloxacin  In D5w] Nausea And Vomiting    Stated by patient she could not handle levaquin  even with antiemetics    Shrimp [Shellfish Allergy] Rash and Hives    "splotches"    Amlodipine      severe leg edema   Atropine Sulfate Other (See Comments)   Elemental Sulfur Itching        Glycopyrrolate  Other (See Comments)   Ramipril Other (See Comments)   Sulfa Antibiotics Other (See Comments)   Compazine [Prochlorperazine Edisylate] Anxiety   Latex Rash    Home Medications    Prior to Admission medications   Medication Sig Start Date End Date Taking? Authorizing Provider  calcitonin, salmon, (MIACALCIN /FORTICAL) 200 UNIT/ACT nasal spray Place 1 spray into alternate nostrils daily. 09/03/23   Lorita Rosa, MD  cyclobenzaprine  (FLEXERIL ) 10 MG tablet Take 1 tablet (10 mg total) by mouth 3 (three) times daily as needed for muscle spasms. 10/02/23   Noble Bateman, PA  EPINEPHrine  0.3 mg/0.3 mL IJ SOAJ injection Inject 0.3 mg into the muscle once as needed for anaphylaxis.    [provider]  esomeprazole (NEXIUM) 40 MG capsule Take 40 mg by mouth in the morning.    [provider]  estradiol  (ESTRACE ) 0.1 MG/GM vaginal cream Place 1 g vaginally 3 (three) times a week. 07/02/23   Lacey Pian, MD  furosemide  (LASIX ) 20 MG tablet Take 1 tablet (20 mg total) by mouth daily as needed. 10/30/23   Knox Perl, MD  gabapentin  (NEURONTIN ) 300 MG capsule Take 300 mg by mouth 2 (two) times daily. 02/14/21   [provider]  isosorbide  mononitrate (IMDUR ) 120 MG 24 hr tablet Take 1 tablet (120 mg total) by mouth daily. 08/03/23   Tania Familia, NP  lidocaine  (LIDODERM ) 5 % Place 1 patch onto the skin daily. Remove & Discard patch within 12 hours or as directed by MD 09/02/23   Lorita Rosa, MD   magnesium 30 MG tablet Take 30 mg by mouth 2 (two) times daily.    [provider]  nitroGLYCERIN  (NITROSTAT ) 0.4 MG SL tablet Place 1 tablet (0.4 mg total) under the tongue every 5 (five) minutes as needed for up to 25 days for chest pain. 08/03/23 10/02/23  Tania Familia, NP  ondansetron  (ZOFRAN -ODT) 4 MG disintegrating tablet Take 1 tablet (4 mg total) by mouth every 8 (eight) hours as needed. 09/15/23   White, Maybelle Spatz, NP  oxyCODONE  (OXY IR/ROXICODONE ) 5 MG immediate release tablet Take 1 tablet (5 mg total) by mouth every 6 (six) hours as needed for moderate pain (pain score 4-6). Patient not taking: Reported on 01/11/2024 09/02/23   Lorita Rosa, MD  potassium chloride  SA (KLOR-CON  M20) 20 MEQ tablet TAKE 1 TABLET BY MOUTH EVERY DAY 10/30/23   Knox Perl, MD  Probiotic Product (ALIGN PO) Take by mouth.    [provider]  propranolol  (INDERAL ) 20 MG tablet Take 1 tablet (20 mg total) by mouth 3 (three) times daily. 07/16/23   Knox Perl, MD  rosuvastatin  (CRESTOR ) 10 MG tablet TAKE 1 TABLET BY MOUTH EVERY DAY 06/29/23   Knox Perl, MD  sodium chloride  (OCEAN) 0.65 % SOLN nasal spray Place 1 spray into both nostrils as needed for congestion.    [provider]  VITAMIN D  PO Take 1 capsule by mouth every other day. In the morning    [provider]    Physical Exam    Vital Signs:  Lorijean Husser Borchers does not have vital signs available for review today.  Given telephonic nature of communication, physical exam is limited. AAOx3. NAD. Normal affect.  Speech and respirations are unlabored.  Accessory Clinical Findings    None  Assessment & Plan    1.  Preoperative Cardiovascular Risk Assessment:  According to the Revised Cardiac Risk Index (RCRI), her Perioperative Risk of Major Cardiac Event is (%): 0.9. Her Functional Capacity in METs is: 4.95 according to the [facility] Activity Status Index (DASI). Therefore, based on ACC/AHA guidelines, patient would  be at acceptable risk for the planned procedure without further cardiovascular testing.   Regarding ASA therapy, we recommend continuation of ASA throughout the perioperative period.   SBE prophylaxis is not required for this pt from a cardiac standpoint.   The patient was advised that if she develops new symptoms prior to surgery to contact our office to arrange for a follow-up visit, and she verbalized understanding.  A copy of this note will be  routed to requesting surgeon.  Time:   Today, I have spent 10  minutes with the patient with telehealth technology discussing medical history, symptoms, and management plan.     Jude Norton, NP  01/14/2024, 2:14 PM

## 2024-01-16 LAB — URINE CULTURE: Culture: >=100000 — AB

## 2024-01-22 ENCOUNTER — Ambulatory Visit
Admission: RE | Admit: 2024-01-22 | Discharge: 2024-01-22 | Disposition: A | Discharge status: Home / Self Care | Attending: Neurosurgery | Admitting: Neurosurgery

## 2024-01-22 ENCOUNTER — Ambulatory Visit
Admission: RE | Admit: 2024-01-22 | Discharge: 2024-01-22 | Disposition: A | Discharge status: Home / Self Care | Source: Ambulatory Visit | Attending: Neurosurgery | Admitting: Neurosurgery

## 2024-01-22 ENCOUNTER — Ambulatory Visit (INDEPENDENT_AMBULATORY_CARE_PROVIDER_SITE_OTHER): Admitting: Neurosurgery

## 2024-01-22 VITALS — BP 140/84 | Ht 67.0 in | Wt 172.0 lb

## 2024-01-22 DIAGNOSIS — S32020S Wedge compression fracture of second lumbar vertebra, sequela: Secondary | ICD-10-CM | POA: Diagnosis not present

## 2024-01-22 DIAGNOSIS — S32020K Wedge compression fracture of second lumbar vertebra, subsequent encounter for fracture with nonunion: Secondary | ICD-10-CM | POA: Diagnosis not present

## 2024-01-22 DIAGNOSIS — S22080K Wedge compression fracture of T11-T12 vertebra, subsequent encounter for fracture with nonunion: Secondary | ICD-10-CM | POA: Diagnosis not present

## 2024-01-22 DIAGNOSIS — M4316 Spondylolisthesis, lumbar region: Secondary | ICD-10-CM | POA: Diagnosis not present

## 2024-01-22 DIAGNOSIS — M545 Low back pain, unspecified: Secondary | ICD-10-CM | POA: Insufficient documentation

## 2024-01-22 DIAGNOSIS — M47816 Spondylosis without myelopathy or radiculopathy, lumbar region: Secondary | ICD-10-CM | POA: Diagnosis not present

## 2024-01-22 DIAGNOSIS — M4856XA Collapsed vertebra, not elsewhere classified, lumbar region, initial encounter for fracture: Secondary | ICD-10-CM | POA: Diagnosis not present

## 2024-01-22 DIAGNOSIS — M48061 Spinal stenosis, lumbar region without neurogenic claudication: Secondary | ICD-10-CM | POA: Diagnosis not present

## 2024-01-22 MED ORDER — CYCLOBENZAPRINE HCL 10 MG PO TABS: 10.0000 mg | ORAL_TABLET | Freq: Three times a day (TID) | ORAL | 1 refills | Status: DC | PRN

## 2024-01-22 NOTE — Progress Notes (Signed)
 Follow-up note: Referring Physician:  No referring provider defined for this encounter.  Primary Physician:  Annabell Key, [place]  E, PA  Chief Complaint:  f/u for compression fracture  History of Present Illness: 01/22/2024 Amanda Lambert presents today for evaluation of ongoing back pain and tightness since her compression fracture and kyphoplasty in December 2024.  She has had ongoing pain in her low back that improves with rest and heat and is exacerbated by either too much activity or too much inactivity. She has not had any radiating symptoms. She states she is not able to do more than 10-15 minutes of activity without having to rest due to spasms and pain in her mid back  10/25/23 Amanda Lambert presents today to review her DEXA results and discuss her right interscapular pain and his response to dry needling and Flexeril . Today she reports resolution of the intrascapular pain she was having at her last visit.  She took a few doses of Flexeril  and this resolved.  She did not end up going to physical therapy for dry needling.  While she does have some "tiredness" in her low back she denies any significant pain.   10/02/23 Amanda Lambert is a 78 y.o. female who presents today for hospital follow-up of compression fracture. She was seen in the hospital about 4 weeks ago with worsening low back pain and was found to have multiple thoracolumbar compression fractures.  She underwent kyphoplasty on 09/05/2023 at T12, L1, and L3.  She states that she had some flank pain after this however she presented to the ER on 09/15/2023 and was diagnosed with a UTI.  The symptoms have since resolved.  Her current complaint is pain in her right intrascapular region that is worse with laying flat.  She has been taking gabapentin  600 mg twice daily which does provide some relief.  She denies any pain that radiates down the length of her arm.  She has also tried Robaxin  and over-the-counter medications without any significant  relief.  08/31/2023 Amanda Lambert is here today with a chief complaint of worsening pain.   I saw her in clinic approximately 10 days ago.  She was doing well at that time.  Over the past several days, she has had worsening back pain.  She presented with incapacitating pain.  Workup revealed a worsening fracture 2 levels above her previously identified fracture.  She denies any other neurologic symptoms.   08/21/2023 Amanda Lambert is here today with a chief complaint of pain which precipitated workup showing an L2 compression fracture.  She has no new neurologic symptoms.  She was given an LSO brace and has been compliant with her brace.  It does help her.  Her pain is improved significantly over the past couple of weeks.  She is however still having pain.     Conservative measures:  Physical therapy: has not participated in  Multimodal medical therapy including regular antiinflammatories:  baclofen , lidocaine  patches, hydrocodone ,  Injections: has not received epidural steroid injections   Past Surgery: no prior spinal surgeries   Amanda Lambert has no symptoms of cervical myelopathy.   The symptoms are causing a significant impact on the patient's life.    I have utilized the care everywhere function in epic to review the outside records available from external health systems.  Review of Systems:  A 10 point review of systems is negative,  and the pertinent positives and negatives detailed in the HPI.  Past Medical History: Past Medical  History:  Diagnosis Date   Anemia    Angina pectoris (HCC)    Aortic atherosclerosis (HCC)    Arthritis    Bilateral carotid artery disease (HCC) 10/15/2019   a.) carotid doppler 10/15/2019: 50-69% BICA; b.) carotid doppler 04/06/2020: 50-69% RICA, 1-15% LICA; c.) carotid doppler 11/03/2020 and 04/29/2021: 50-69% RICA, 16-49% LICA; d.) carotid doppler 03/02/2022: 50-69% BICA; e.) carotid doppler 03/14/2023: 50-69% RICA, 16-49% LICA   Biliary  dyskinesia    CAD (coronary artery disease) 06/14/2021   a.) LHC 11/04/2019: 50% mLAD - med mgmt; b.) LHC/PCI 06/14/2021: 40% p-mLAD, 80% mLAD (3.0 x 30 mm Onyx Frontier DES; c.) LHC for FFR study: 07/19/2021: 40% pLAD; RFR 0.92, FFR 0.89, CFR 4.8, IMR 18 --> no significant macro/microvascular disease   CKD (chronic kidney disease), stage III (HCC)    Complication of anesthesia    a.) PONV; b.) delayed emergence   Constipation    Diastolic dysfunction 10/15/2019   a.) TTE 10/15/2019: EF 50-55%, norm LAP, mild LA dil, mod MR, mild-mod TR, G1DD; b.) TTE 05/19/2021: EF 50-55%, mild basal sep hypertrophy, norm LAP, triv MR/TR, G1DD   GERD (gastroesophageal reflux disease)    Hepatitis B 1975   Hypercholesteremia    Hypertension    Inguinal hernia    Irritable bowel syndrome without diarrhea    LBBB (left bundle branch block)    Leukopenia    Long term current use of aspirin     PONV (postoperative nausea and vomiting)    [facility] spotted fever 04/01/2012   adm. to hospital   Syncope 10/17/2019   Thrombocytopenia (HCC)    Transaminitis    Trochanteric bursitis, right hip    Vaginal Pap smear, abnormal    Varicose veins of leg with edema, bilateral    Weakness     Past Surgical History: Past Surgical History:  Procedure Laterality Date   APPENDECTOMY  09/25/1961   CHOLECYSTECTOMY  04/26/2012   Procedure: LAPAROSCOPIC CHOLECYSTECTOMY WITH INTRAOPERATIVE CHOLANGIOGRAM;  Surgeon: Mayme Spearman, MD;  Location: [facility];  Service: General;  Laterality: N/A;   COLONOSCOPY     CORONARY ANGIOGRAPHY N/A 07/19/2021   Procedure: CORONARY ANGIOGRAPHY (CATH LAB);  Surgeon: Knox Perl, MD;  Location: [facility] INVASIVE CV LAB;  Service: Cardiovascular;  Laterality: N/A;   CORONARY IMAGING/OCT N/A 06/14/2021   Procedure: INTRAVASCULAR IMAGING/OCT;  Surgeon: Knox Perl, MD;  Location: [facility] INVASIVE CV LAB;  Service: Cardiovascular;  Laterality: N/A;   CORONARY PRESSURE/FFR STUDY N/A 07/19/2021    Procedure: INTRAVASCULAR PRESSURE WIRE/FFR STUDY;  Surgeon: Knox Perl, MD;  Location: [facility] INVASIVE CV LAB;  Service: Cardiovascular;  Laterality: N/A;   CORONARY STENT INTERVENTION N/A 06/14/2021   Procedure: CORONARY STENT INTERVENTION;  Surgeon: Knox Perl, MD;  Location: [facility] INVASIVE CV LAB;  Service: Cardiovascular;  Laterality: N/A;   DILATION AND CURETTAGE OF UTERUS  09/25/1980   for miscarriage   DILATION AND CURETTAGE, DIAGNOSTIC / THERAPEUTIC  09/25/1970   INSERTION OF MESH Right 05/03/2023   Procedure: INSERTION OF MESH;  Surgeon: Conrado Delay, DO;  Location: [facility] ORS;  Service: General;  Laterality: Right;   IR KYPHO EA ADDL LEVEL THORACIC OR LUMBAR  09/05/2023   IR KYPHO LUMBAR INC FX REDUCE BONE BX UNI/BIL CANNULATION INC/IMAGING  09/05/2023   IR KYPHO THORACIC WITH BONE BIOPSY  09/05/2023   LEFT HEART CATH AND CORONARY ANGIOGRAPHY N/A 11/04/2019   Procedure: LEFT HEART CATH AND CORONARY ANGIOGRAPHY;  Surgeon: Knox Perl, MD;  Location: [facility] INVASIVE CV LAB;  Service: Cardiovascular;  Laterality: N/A;   LEFT HEART CATH AND CORONARY ANGIOGRAPHY N/A 06/14/2021   Procedure: LEFT HEART CATH AND CORONARY ANGIOGRAPHY;  Surgeon: Knox Perl, MD;  Location: [facility] INVASIVE CV LAB;  Service: Cardiovascular;  Laterality: N/A;   TONSILLECTOMY  1964  - approximate   WRIST FRACTURE SURGERY  09/25/2006   right    Allergies: Allergies as of 01/22/2024 - Review Complete 01/14/2024  Allergen Reaction Noted   Ace inhibitors Anaphylaxis 03/30/2012   Bee venom Anaphylaxis and Other (See Comments) 01/28/2022   Thorazine [chlorpromazine] Anaphylaxis 03/30/2012   Wasp venom Anaphylaxis 10/17/2019   Levaquin  [levofloxacin  in d5w] Nausea And Vomiting 08/28/2016   Shrimp [shellfish allergy] Rash and Hives 04/04/2014   Amlodipine   01/25/2021   Atropine sulfate Other (See Comments) 01/28/2022   Elemental sulfur Itching 10/13/2019   Glycopyrrolate  Other (See Comments) 01/28/2022   Ramipril Other (See  Comments) 01/28/2022   Sulfa antibiotics Other (See Comments) 01/28/2022   Compazine [prochlorperazine edisylate] Anxiety 03/30/2012   Latex Rash 08/28/2016    Medications: Outpatient Encounter Medications as of 01/22/2024  Medication Sig   calcitonin, salmon, (MIACALCIN /FORTICAL) 200 UNIT/ACT nasal spray Place 1 spray into alternate nostrils daily.   cyclobenzaprine  (FLEXERIL ) 10 MG tablet Take 1 tablet (10 mg total) by mouth 3 (three) times daily as needed for muscle spasms.   EPINEPHrine  0.3 mg/0.3 mL IJ SOAJ injection Inject 0.3 mg into the muscle once as needed for anaphylaxis.   esomeprazole (NEXIUM) 40 MG capsule Take 40 mg by mouth in the morning.   estradiol  (ESTRACE ) 0.1 MG/GM vaginal cream Place 1 g vaginally 3 (three) times a week.   furosemide  (LASIX ) 20 MG tablet Take 1 tablet (20 mg total) by mouth daily as needed.   gabapentin  (NEURONTIN ) 300 MG capsule Take 300 mg by mouth 2 (two) times daily.   isosorbide  mononitrate (IMDUR ) 120 MG 24 hr tablet Take 1 tablet (120 mg total) by mouth daily.   lidocaine  (LIDODERM ) 5 % Place 1 patch onto the skin daily. Remove & Discard patch within 12 hours or as directed by MD   magnesium 30 MG tablet Take 30 mg by mouth 2 (two) times daily.   nitroGLYCERIN  (NITROSTAT ) 0.4 MG SL tablet Place 1 tablet (0.4 mg total) under the tongue every 5 (five) minutes as needed for up to 25 days for chest pain.   ondansetron  (ZOFRAN -ODT) 4 MG disintegrating tablet Take 1 tablet (4 mg total) by mouth every 8 (eight) hours as needed.   oxyCODONE  (OXY IR/ROXICODONE ) 5 MG immediate release tablet Take 1 tablet (5 mg total) by mouth every 6 (six) hours as needed for moderate pain (pain score 4-6). (Patient not taking: Reported on 01/11/2024)   potassium chloride  SA (KLOR-CON  M20) 20 MEQ tablet TAKE 1 TABLET BY MOUTH EVERY DAY   Probiotic Product (ALIGN PO) Take by mouth.   propranolol  (INDERAL ) 20 MG tablet Take 1 tablet (20 mg total) by mouth 3 (three) times  daily.   rosuvastatin  (CRESTOR ) 10 MG tablet TAKE 1 TABLET BY MOUTH EVERY DAY   sodium chloride  (OCEAN) 0.65 % SOLN nasal spray Place 1 spray into both nostrils as needed for congestion.   VITAMIN D  PO Take 1 capsule by mouth every other day. In the morning   No facility-administered encounter medications on file as of 01/22/2024.    Social History: Social History   Tobacco Use   Smoking status: Never   Smokeless tobacco: Never  Vaping Use   Vaping status: Never Used  Substance Use Topics   Alcohol use: No   Drug use: No    Family Medical History: Family History  Problem Relation Age of Onset   Cardiomyopathy Mother    Coronary artery disease Father    Hypertension Brother    Prostate cancer Brother     Exam: There were no vitals filed for this visit.  There is no height or weight on file to calculate BMI.   General: A&O ROM of spine: WNL.  Palpation of spine: TTP over right intrascapular region.  Strength in the left lower extremity is EHL 5/5, Dorsiflexion 5/5, Plantar flexion 5/5, Hamstring 5/5, Quadricep 5/5, Iliopsoas 5/5. Strength in the right lower extremity is EHL 5/5, Dorsiflexion 5/5, Plantar flexion 5/5, Hamstring 5/5, Quadricep 5/5, Iliopsoas 5/5. Reflexes are 1+ and symmetric at the patella and achilles.   Bilateral lower extremity sensation is intact to light touch.  Ambulates with a slowed but steady gait with the assistance of a cane  Imaging: 10/09/23 DEXA LUMBAR  SPINE L1- L4                   HIP L.FEM. NECK 10/09/23 0.554 -2.7                 L.TOTAL HIP 10/09/23 0.641 -2.5                FOREARM  L/R 33% 10/09/23 0.612 -1.4                 INTERPRETATION: OSTEOPOROSIS. LUMBAR SPINE NOT REPORTED DUE TO PRIOR  SURGERY.     FRACTURE RISK ASSESSMENT (FRAX)  __7.8___  10 year risk of hip fracture.      ___25__  10 year risk of any  major fracture.   TREATMENT:   The National Osteoporosis Foundation (2014) Treatment Guidelines for:   Consider FDA-approved medical therapies in postmenopausal women and men  aged 35 years and older, based on the following:  A hip or vertebral (clinical or morphometric) fracture  T-score <= -2.5 at the femoral neck or spine after appropriate evaluation  to exclude secondary causes  Low bone mass (T-score between -1.0 and -2.5 at the femoral neck or spine)  and a 10-year probability of a hip fracture >= 3% or a 10-year probability  of a major osteoporosis-related fracture >= 20% based on the UA-adapted  WHO algorithm  Clinicians judgment and/or patient preferences may indicate treatment for  people with 10-year fracture probabilities above or below these levels  BMD should be monitored two years after initiating therapy and at two-year  intervals thereafter.   09/28/23 Thoracic and lumbar xrays.  There does not appear to be any acute fracture on thoracic x-rays.  Lumbar x-rays show interval T12, L1, and L2 kyphoplasties with a chronic L3 compression deformity.  There does not appear to be any other acute abnormalities. Formal radiology report pending.  Assessment and Plan: Amanda Lambert is a pleasant 78 y.o. female with a history of multiple vertebral fractures and kyphoplasties.  She continues to have pain and tightness in her back despite PT and time.  I recommended updated lumbar x-rays to evaluate for any worsening or new fractures.  I also gave her a refill of her Flexeril .  I will see her back in 2 to 3 weeks to evaluate her progress.  Should she continue to have symptoms it may be worthwhile considering referral for dry needling injections.   I spent a total of 25 minutes in both face-to-face  and non-face-to-face activities for this visit on the date of this encounter including review of records, discussion of symptoms, order placement, and documentation.  Anastacio Karvonen PA-C Neurosurgery

## 2024-01-25 DIAGNOSIS — E559 Vitamin D deficiency, unspecified: Secondary | ICD-10-CM | POA: Diagnosis not present

## 2024-01-25 DIAGNOSIS — M810 Age-related osteoporosis without current pathological fracture: Secondary | ICD-10-CM | POA: Diagnosis not present

## 2024-01-25 DIAGNOSIS — R5383 Other fatigue: Secondary | ICD-10-CM | POA: Diagnosis not present

## 2024-01-28 HISTORY — PX: TOTAL SHOULDER ARTHROPLASTY: SHX126

## 2024-01-29 ENCOUNTER — Encounter (HOSPITAL_COMMUNITY): Payer: Self-pay

## 2024-01-29 ENCOUNTER — Emergency Department (HOSPITAL_COMMUNITY)

## 2024-01-29 ENCOUNTER — Other Ambulatory Visit: Payer: Self-pay

## 2024-01-29 ENCOUNTER — Emergency Department (HOSPITAL_COMMUNITY)
Admission: EM | Admit: 2024-01-29 | Discharge: 2024-01-29 | Disposition: A | Discharge status: Home / Self Care | Attending: Emergency Medicine | Admitting: Emergency Medicine

## 2024-01-29 DIAGNOSIS — R0902 Hypoxemia: Secondary | ICD-10-CM | POA: Diagnosis not present

## 2024-01-29 DIAGNOSIS — S42201A Unspecified fracture of upper end of right humerus, initial encounter for closed fracture: Secondary | ICD-10-CM | POA: Diagnosis not present

## 2024-01-29 DIAGNOSIS — W010XXA Fall on same level from slipping, tripping and stumbling without subsequent striking against object, initial encounter: Secondary | ICD-10-CM | POA: Diagnosis not present

## 2024-01-29 DIAGNOSIS — Z91040 Latex allergy status: Secondary | ICD-10-CM | POA: Diagnosis not present

## 2024-01-29 DIAGNOSIS — M25511 Pain in right shoulder: Secondary | ICD-10-CM | POA: Diagnosis present

## 2024-01-29 DIAGNOSIS — S42211A Unspecified displaced fracture of surgical neck of right humerus, initial encounter for closed fracture: Secondary | ICD-10-CM | POA: Diagnosis not present

## 2024-01-29 DIAGNOSIS — I10 Essential (primary) hypertension: Secondary | ICD-10-CM | POA: Diagnosis not present

## 2024-01-29 DIAGNOSIS — S42291A Other displaced fracture of upper end of right humerus, initial encounter for closed fracture: Secondary | ICD-10-CM | POA: Diagnosis not present

## 2024-01-29 DIAGNOSIS — R609 Edema, unspecified: Secondary | ICD-10-CM | POA: Diagnosis not present

## 2024-01-29 DIAGNOSIS — S4981XA Other specified injuries of right shoulder and upper arm, initial encounter: Secondary | ICD-10-CM | POA: Diagnosis not present

## 2024-01-29 DIAGNOSIS — W19XXXA Unspecified fall, initial encounter: Secondary | ICD-10-CM | POA: Diagnosis not present

## 2024-01-29 MED ORDER — OXYCODONE HCL 5 MG PO TABS
5.0000 mg | ORAL_TABLET | Freq: Four times a day (QID) | ORAL | 0 refills | Status: AC | PRN
Start: 2024-01-29 — End: 2024-02-03

## 2024-01-29 MED ORDER — ONDANSETRON 4 MG PO TBDP: 4.0000 mg | ORAL_TABLET | Freq: Three times a day (TID) | ORAL | 0 refills | Status: AC | PRN

## 2024-01-29 MED ORDER — ONDANSETRON HCL 4 MG/2ML IJ SOLN
4.0000 mg | Freq: Once | INTRAMUSCULAR | Status: AC
  Administered 2024-01-29: 4 mg via INTRAVENOUS
  Filled 2024-01-29: qty 2

## 2024-01-29 MED ORDER — FENTANYL CITRATE PF 50 MCG/ML IJ SOSY
50.0000 ug | PREFILLED_SYRINGE | Freq: Once | INTRAMUSCULAR | Status: AC
  Administered 2024-01-29: 50 ug via INTRAVENOUS
  Filled 2024-01-29: qty 1

## 2024-01-29 MED ORDER — OXYCODONE-ACETAMINOPHEN 5-325 MG PO TABS
1.0000 | ORAL_TABLET | Freq: Once | ORAL | Status: AC
  Administered 2024-01-29: 1 via ORAL
  Filled 2024-01-29: qty 1

## 2024-01-29 MED ORDER — KETOROLAC TROMETHAMINE 30 MG/ML IJ SOLN
15.0000 mg | Freq: Once | INTRAMUSCULAR | Status: AC
  Administered 2024-01-29: 15 mg via INTRAVENOUS
  Filled 2024-01-29: qty 1

## 2024-01-29 NOTE — ED Provider Notes (Signed)
 [facility] AT [facility] Provider Note   CSN: [id number] Arrival date & time: 01/29/24  1827     History  Chief Complaint  Patient presents with   Amanda Lambert is a 78 y.o. female who presents with right shoulder pain after a tackle fall earlier today.  States she tripped over her garden hose, causing her to fall on her right hip and shoulder.  She also reports a very slight bump to her head, but says this was very insignificant.  Denies any loss of consciousness or pain in her head.  Denies any vision changes, nausea or vomiting.  Denies any numbness or tingling in her right upper extremity.   Fall       Home Medications Prior to Admission medications   Medication Sig Start Date End Date Taking? Authorizing Provider  ondansetron  (ZOFRAN -ODT) 4 MG disintegrating tablet Take 1 tablet (4 mg total) by mouth every 8 (eight) hours as needed for nausea or vomiting. 01/29/24  Yes Rexie Catena, PA-C  oxyCODONE  (ROXICODONE ) 5 MG immediate release tablet Take 1 tablet (5 mg total) by mouth every 6 (six) hours as needed for up to 5 days for severe pain (pain score 7-10) or breakthrough pain (Pain not controlled with tylenol  and ibuprofen). 01/29/24 02/03/24 Yes Alanta Scobey, PA-C  calcitonin, salmon, (MIACALCIN Marciano Settles) 200 UNIT/ACT nasal spray Place 1 spray into alternate nostrils daily. 09/03/23   Lorita Rosa, MD  cyclobenzaprine  (FLEXERIL ) 10 MG tablet Take 1 tablet (10 mg total) by mouth 3 (three) times daily as needed for muscle spasms. 01/22/24   Noble Bateman, PA  EPINEPHrine  0.3 mg/0.3 mL IJ SOAJ injection Inject 0.3 mg into the muscle once as needed for anaphylaxis.    [provider]  esomeprazole (NEXIUM) 40 MG capsule Take 40 mg by mouth in the morning.    [provider]  estradiol  (ESTRACE ) 0.1 MG/GM vaginal cream Place 1 g vaginally 3 (three) times a week. 07/02/23   Lacey Pian, MD  furosemide  (LASIX ) 20  MG tablet Take 1 tablet (20 mg total) by mouth daily as needed. 10/30/23   Knox Perl, MD  gabapentin  (NEURONTIN ) 300 MG capsule Take 300 mg by mouth 2 (two) times daily. 02/14/21   [provider]  isosorbide  mononitrate (IMDUR ) 120 MG 24 hr tablet Take 1 tablet (120 mg total) by mouth daily. 08/03/23   Tania Familia, NP  lidocaine  (LIDODERM ) 5 % Place 1 patch onto the skin daily. Remove & Discard patch within 12 hours or as directed by MD 09/02/23   Lorita Rosa, MD  magnesium 30 MG tablet Take 30 mg by mouth 2 (two) times daily.    [provider]  nitroGLYCERIN  (NITROSTAT ) 0.4 MG SL tablet Place 1 tablet (0.4 mg total) under the tongue every 5 (five) minutes as needed for up to 25 days for chest pain. 08/03/23 01/22/24  Tania Familia, NP  potassium chloride  SA (KLOR-CON  M20) 20 MEQ tablet TAKE 1 TABLET BY MOUTH EVERY DAY 10/30/23   Knox Perl, MD  Probiotic Product (ALIGN PO) Take by mouth.    [provider]  propranolol  (INDERAL ) 20 MG tablet Take 1 tablet (20 mg total) by mouth 3 (three) times daily. 07/16/23   Knox Perl, MD  rosuvastatin  (CRESTOR ) 10 MG tablet TAKE 1 TABLET BY MOUTH EVERY DAY 06/29/23   Knox Perl, MD  VITAMIN D  PO Take 1 capsule by mouth every other day. In the morning  [provider]      Allergies    Ace inhibitors, Bee venom, Thorazine [chlorpromazine], Wasp venom, Levaquin  [levofloxacin  in d5w], Shrimp [shellfish allergy], Amlodipine , Atropine sulfate, Elemental sulfur, Glycopyrrolate , Ramipril, Sulfa antibiotics, Compazine [prochlorperazine edisylate], and Latex    Review of Systems   Review of Systems  Musculoskeletal:        Right shoulder pain    Physical Exam Updated Vital Signs BP (!) 185/100   Pulse 78   Temp 98 F (36.7 C) (Oral)   Resp 16   Ht 5\' 7"  (1.702 m)   Wt 81.6 kg   SpO2 95%   BMI 28.19 kg/m  Physical Exam Vitals and nursing note reviewed.  Constitutional:      Appearance: Normal  appearance.  HENT:     Head: Normocephalic and atraumatic.     Comments: No skull tenderness to palpation diffusely, no step-offs noted No raccoon eyes or Battle sign.  No hematomas or lacerations Eyes:     Extraocular Movements: Extraocular movements intact.     Pupils: Pupils are equal, round, and reactive to light.  Cardiovascular:     Comments: 2+ radial pulse bilaterally 2+ dorsalis pedis pulse bilaterally Pulmonary:     Effort: Pulmonary effort is normal.  Musculoskeletal:     Comments: General No obvious deformity. No erythema, edema, contusions, open wounds   Palpation Tender along the proximal aspect of the right humerus, nontender at the distal aspect of the humerus.  Nontender of the radius or ulna bilaterally.  Nontender to palpation of the left humerus.  Non-tender to palpation along the clavicle, AC joint bilaterally.  Non-tender to palpation over the scapular ridge bilaterally   Non-tender of the carpal bones diffusely, 1st through 5th metacarpals or phalanges bilaterally  Nontender of the pelvis diffusely  Nontender of the femur, tibia bilaterally  ROM Unable to perform range of motion of the right shoulder due to pain  Able to fully flex and extend in the right elbow and right wrist  Flexes at the hips bilaterally without difficulty.  Flexes and extends at the knees bilaterally without difficulty   Sensation: Sensation intact throughout the upper extremity  Strength: 5/5 strength with resisted wrist flexion and extension    Skin:    Comments: Skin of the upper extremities bilaterally intact.  Coloration intact and skin appropriately warm to touch.  Neurological:     General: No focal deficit present.     Mental Status: She is alert.  Psychiatric:        Mood and Affect: Mood normal.        Behavior: Behavior normal.     ED Results / Procedures / Treatments   Labs (all labs ordered are listed, but only abnormal results are displayed) Labs Reviewed  - No data to display  EKG None  Radiology CT Shoulder Right Wo Contrast Result Date: 01/29/2024 CLINICAL DATA:  Shoulder trauma, instability or dislocation suspected, xray done. Fall. EXAM: CT OF THE UPPER RIGHT EXTREMITY WITHOUT CONTRAST TECHNIQUE: Multidetector CT imaging of the upper right extremity was performed according to the standard protocol. RADIATION DOSE REDUCTION: This exam was performed according to the departmental dose-optimization program which includes automated exposure control, adjustment of the mA and/or kV according to patient size and/or use of iterative reconstruction technique. COMPARISON:  Plain films today. FINDINGS: Bones/Joint/Cartilage Comminuted right humeral neck fracture with varus angulation. Involvement of the humeral head near the greater tuberosity. Slight inferior subluxation of the humeral head relative to the glenoid,  possibly related to traumatic joint effusion. Ligaments Not well assessed by CT Muscles and Tendons Negative Soft tissues Negative IMPRESSION: Comminuted, angulated right humeral neck and humeral head fracture. Slight inferior subluxation. Electronically Signed   By: Janeece Mechanic M.D.   On: 01/29/2024 20:12   DG Shoulder Right Portable Result Date: 01/29/2024 CLINICAL DATA:  Injury EXAM: RIGHT SHOULDER - 1 VIEW COMPARISON:  None Available. FINDINGS: There is an acute comminuted impacted fracture involving humeral neck as well as the head/greater tuberosity. There is no dislocation. Soft tissues are within normal limits. IMPRESSION: Acute comminuted impacted fracture involving the humeral neck as well as the head/greater tuberosity. Electronically Signed   By: Tyron Gallon M.D.   On: 01/29/2024 19:02    Procedures Procedures    Medications Ordered in ED Medications  ketorolac  (TORADOL ) 30 MG/ML injection 15 mg (15 mg Intravenous Given 01/29/24 1939)  fentaNYL  (SUBLIMAZE ) injection 50 mcg (50 mcg Intravenous Given 01/29/24 1940)   oxyCODONE -acetaminophen  (PERCOCET/ROXICET) 5-325 MG per tablet 1 tablet (1 tablet Oral Given 01/29/24 2129)  ondansetron  (ZOFRAN ) injection 4 mg (4 mg Intravenous Given 01/29/24 2129)    ED Course/ Medical Decision Making/ A&P                                 Medical Decision Making Amount and/or Complexity of Data Reviewed Radiology: ordered.  Risk Prescription drug management.     Differential diagnosis includes but is not limited to fracture, dislocation, intracranial hemorrhage, sprain  ED Course:  Upon initial evaluation, patient is well-appearing, stable vital signs aside from elevated blood pressure.  Was in significant amount of pain in her right shoulder upon arrival, tender to palpation over the right proximal humerus.  No obvious deformity.  Neurovascularly intact in the bilateral upper and lower extremities.  No tenderness to palpation of the cervical, thoracic, or lumbar spine.  No tenderness diffusely of the pelvis or lower extremities, intact range of motion of the bilateral lower extremities.   Imaging Studies ordered: I ordered imaging studies including x-ray right shoulder, CT right shoulder I independently visualized the imaging with scope of interpretation limited to determining acute life threatening conditions related to emergency care. Imaging showed  X-ray of right shoulder showed acute comminuted impacted fracture involving the humeral neck as well as the humeral head/greater tuberosity CT of the right shoulder showed comminuted, angulated right humeral neck and right humeral head fracture.  Slight inferior subluxation I agree with the radiologist interpretation   Consultations Obtained: I requested consultation with the orthopedic Dr. Deeann Fare,  and discussed lab and imaging findings as well as pertinent plan - they recommend: CT imaging of the right shoulder here in the ER, follow-up with him in office tomorrow morning.  Recommended placing patient in  shoulder sling.  Medications Given: Fentanyl , Toradol , Percocet given for pain Zofran  given for nausea  Upon re-evaluation, patient reports pain well-controlled with pain medications.  Remains neurovascularly intact after sling placement.  Discussed follow-up with Dr. Deeann Fare tomorrow in office as he recommended.  Patient states she follows with orthopedic Dr. [facility] and may follow-up with him if she can get an appointment tomorrow.  Stable and appropriate for discharge home.  I have very low concern for acute intracranial hemorrhage or head injury as patient states she did not hit her head very hard, no signs of external trauma, no neuro deficits, and she has been observed in the emergency for over  3 hours without any change in mental status.  Indication for CT head or neck imaging at this time.    Impression: Closed comminuted right humeral neck fracture Closed right humeral head fracture  Disposition:  The patient was discharged home with instructions to follow-up with orthopedic Dr. Deeann Fare tomorrow, she was instructed to call his office to arrange an appointment.  However, she states she may follow-up with her normal orthopedic provider Dr. [facility]ella. Wear sling at all times.  Tylenol  and ibuprofen as needed for pain.  Oxycodone  as needed for breakthrough pain. Return precautions given.     This chart was dictated using voice recognition software, Dragon. Despite the best efforts of this provider to proofread and correct errors, errors may still occur which can change documentation meaning.          Final Clinical Impression(s) / ED Diagnoses Final diagnoses:  Fx humeral neck, right, closed, initial encounter  Humeral head fracture, right, closed, initial encounter    Rx / DC Orders ED Discharge Orders          Ordered    oxyCODONE  (ROXICODONE ) 5 MG immediate release tablet  Every 6 hours PRN        01/29/24 2051    ondansetron  (ZOFRAN -ODT) 4 MG  disintegrating tablet  Every 8 hours PRN        01/29/24 2051              Rexie Catena, PA-C 01/29/24 2246    Arvilla Birmingham, MD 01/29/24 2351

## 2024-01-29 NOTE — ED Triage Notes (Addendum)
 Patient BIB [facility] from home. Tripped over garden hose and fell onto right shoulder. Bumped her head. No LOC. Takes aspirin  daily.   EMS 200mcg fentanyl  IV 18G left AC

## 2024-01-29 NOTE — Discharge Instructions (Addendum)
 Your x-rays today shows that you have a fracture of your right humerus (upper arm bone).   You have been placed into a sling to hold the fracture in place.  Do not take off the sling until cleared by orthopedics.  You may take up to 1000mg  of tylenol  every 6 hours as needed for pain. Do not take more then 4g per day.   You may use up to 600mg  ibuprofen every 6 hours as needed for pain.  Do not exceed 2.4g of ibuprofen per day.  You may alternate these medications for complete coverage. See instructions below: Take 600mg  ibuprofen, then 3 hours later take 1000mg  tylenol , then 3 hours later 600mg  ibuprofen, then 3 hours later 1000mg  tylenol , so on and so forth for pain control.    You have been prescribed Oxycodone -this is a narcotic/controlled substance medication that has potential addicting qualities.  You may take 1 tablet every 6 hours as needed for severe pain not controlled with tylenol  and ibuprofen.  Do not drive or operate heavy machinery when taking this medicine as it can be sedating. Do not drink alcohol or take other sedating medications when taking this medicine for safety reasons.  Keep this out of reach of small children.     Call the orthopedics office listed below (Dr. Deeann Fare) tomorrow morning to schedule an appointment.  He would like to see you in office tomorrow for follow-up.  Return to the ER for any uncontrolled pain, numbness or tingling, discoloration, any other new or concerning symptoms.

## 2024-01-29 NOTE — ED Notes (Signed)
 Patient d/c'd with home care instructions. Sling in place on right arm by ortho tech. Son at bedside. Assisted patient in wheelchair at this time.

## 2024-01-29 NOTE — ED Notes (Signed)
 Patient right wrist gold colored bracelet and right hand gold colored ring place in biohazard lab bag labeled with patient information and left at patient bedside and patient aware of this. Witnessed by Catharine Clock, PA.

## 2024-01-29 NOTE — Progress Notes (Signed)
 Orthopedic Tech Progress Note Patient Details:  AIJALON GREENHILL 11/25/1945 [id number]  Ortho Devices Type of Ortho Device: Shoulder immobilizer Ortho Device/Splint Location: right shoulder immobilizer applied Ortho Device/Splint Interventions: Ordered, Application, Adjustment   Post Interventions Patient Tolerated: Well Instructions Provided: Adjustment of device, Care of device  Leodis Rainwater 01/29/2024, 8:42 PM

## 2024-01-30 ENCOUNTER — Telehealth: Payer: Self-pay

## 2024-01-30 DIAGNOSIS — S42294A Other nondisplaced fracture of upper end of right humerus, initial encounter for closed fracture: Secondary | ICD-10-CM | POA: Diagnosis not present

## 2024-01-30 NOTE — Telephone Encounter (Signed)
   Patient Name: Amanda Lambert  DOB: 1945-12-21 MRN: [id number]  Primary Cardiologist: Knox Perl, MD  Chart reviewed as part of pre-operative protocol coverage.  Patient had telephone visit on 01/14/2024 with Marlana Silvan, NP.  Patient noted to be stable at that time. It was noted that according to the Revised Cardiac Risk Index (RCRI), her Perioperative Risk of Major Cardiac Event is (%): 0.9. Her Functional Capacity in METs is: 4.95 according to the Duke Activity Status Index (DASI). Therefore, based on ACC/AHA guidelines, patient would be at acceptable risk for the planned procedure without further cardiovascular testing.   Discussed holding of aspirin  81 mg daily with Dr. Berry Bristol, patient can hold aspirin  81 mg daily 5 to 7 days prior to procedure.  Please resume when safe to do so from bleeding standpoint.  I will route this recommendation to the requesting party via Epic fax function and remove from pre-op pool.  Please call with questions.  Masayuki Sakai D Cathlyn Tersigni, NP 01/30/2024, 3:17 PM

## 2024-01-30 NOTE — Telephone Encounter (Signed)
   Pre-operative Risk Assessment    Patient Name: Amanda Lambert  DOB: Feb 25, 1946 MRN: [id number]   Date of last office visit: 08/03/23 Friddie Jetty, NP Date of next office visit: 03/06/24 Knox Perl, MD   Request for Surgical Clearance    Procedure:   RIGHT REVERSE TOTAL SHOULDER ARTHROPLASTY FOR FRACTURE  Date of Surgery:  Clearance 01/31/24                                Surgeon:  Neville Barbone, MD Surgeon's Group or Practice Name:  [facility] Phone number:  [phone]  EXT. [phone] Fax number:  [phone]  ATTN: KELLY HIGH   Type of Clearance Requested:   - Medical  - Pharmacy:  Hold Aspirin  (NOTED ON REQUEST) (NOT ON MED LIST)   Type of Anesthesia:  General  W/ INTERSCALENE BLOCK   Additional requests/questions:    Signed, Collin Deal   01/30/2024, 2:10 PM

## 2024-01-30 NOTE — Telephone Encounter (Signed)
 Request from Katlyn West, NP to get clarification from [facility] if patient has already been told to hold aspirin  or if they are planning to continue it through the perioperative period.  Called [facility] and left a message for [employer] to give our office a call back.

## 2024-01-30 NOTE — Telephone Encounter (Signed)
 Amanda Lambert from [facility] send secure chat about ASA hold.   Amanda Lambert stated the following: We have told to patient to hold asa. Anesthesia just needs Dr. Berry Bristol to sign off,.

## 2024-01-31 DIAGNOSIS — G8918 Other acute postprocedural pain: Secondary | ICD-10-CM | POA: Diagnosis not present

## 2024-01-31 DIAGNOSIS — S42241A 4-part fracture of surgical neck of right humerus, initial encounter for closed fracture: Secondary | ICD-10-CM | POA: Diagnosis not present

## 2024-01-31 DIAGNOSIS — S42201A Unspecified fracture of upper end of right humerus, initial encounter for closed fracture: Secondary | ICD-10-CM | POA: Diagnosis not present

## 2024-01-31 DIAGNOSIS — X58XXXA Exposure to other specified factors, initial encounter: Secondary | ICD-10-CM | POA: Diagnosis not present

## 2024-01-31 DIAGNOSIS — Y999 Unspecified external cause status: Secondary | ICD-10-CM | POA: Diagnosis not present

## 2024-02-11 ENCOUNTER — Emergency Department (HOSPITAL_COMMUNITY)

## 2024-02-11 ENCOUNTER — Other Ambulatory Visit: Payer: Self-pay

## 2024-02-11 ENCOUNTER — Emergency Department (HOSPITAL_COMMUNITY)
Admission: EM | Admit: 2024-02-11 | Discharge: 2024-02-11 | Disposition: A | Discharge status: Home / Self Care | Attending: Emergency Medicine | Admitting: Emergency Medicine

## 2024-02-11 DIAGNOSIS — Z471 Aftercare following joint replacement surgery: Secondary | ICD-10-CM | POA: Diagnosis not present

## 2024-02-11 DIAGNOSIS — Z043 Encounter for examination and observation following other accident: Secondary | ICD-10-CM | POA: Diagnosis not present

## 2024-02-11 DIAGNOSIS — Z96611 Presence of right artificial shoulder joint: Secondary | ICD-10-CM | POA: Diagnosis not present

## 2024-02-11 DIAGNOSIS — W19XXXA Unspecified fall, initial encounter: Secondary | ICD-10-CM | POA: Diagnosis not present

## 2024-02-11 DIAGNOSIS — Z91040 Latex allergy status: Secondary | ICD-10-CM | POA: Diagnosis not present

## 2024-02-11 DIAGNOSIS — R110 Nausea: Secondary | ICD-10-CM | POA: Diagnosis not present

## 2024-02-11 DIAGNOSIS — M25511 Pain in right shoulder: Secondary | ICD-10-CM | POA: Diagnosis not present

## 2024-02-11 DIAGNOSIS — I10 Essential (primary) hypertension: Secondary | ICD-10-CM | POA: Diagnosis not present

## 2024-02-11 DIAGNOSIS — M25519 Pain in unspecified shoulder: Secondary | ICD-10-CM | POA: Diagnosis not present

## 2024-02-11 DIAGNOSIS — I700 Atherosclerosis of aorta: Secondary | ICD-10-CM | POA: Diagnosis not present

## 2024-02-11 DIAGNOSIS — S299XXA Unspecified injury of thorax, initial encounter: Secondary | ICD-10-CM | POA: Diagnosis not present

## 2024-02-11 LAB — CBC WITH DIFFERENTIAL/PLATELET
Abs Immature Granulocytes: 0.02 10*3/uL (ref 0.00–0.07)
Basophils Absolute: 0.1 10*3/uL (ref 0.0–0.1)
Basophils Relative: 1 %
Eosinophils Absolute: 0.1 10*3/uL (ref 0.0–0.5)
Eosinophils Relative: 2 %
HCT: 35.3 % — ABNORMAL LOW (ref 36.0–46.0)
Hemoglobin: 11.1 g/dL — ABNORMAL LOW (ref 12.0–15.0)
Immature Granulocytes: 0 %
Lymphocytes Relative: 15 %
Lymphs Abs: 1.1 10*3/uL (ref 0.7–4.0)
MCH: 29.4 pg (ref 26.0–34.0)
MCHC: 31.4 g/dL (ref 30.0–36.0)
MCV: 93.6 fL (ref 80.0–100.0)
Monocytes Absolute: 0.3 10*3/uL (ref 0.1–1.0)
Monocytes Relative: 4 %
Neutro Abs: 5.8 10*3/uL (ref 1.7–7.7)
Neutrophils Relative %: 78 %
Platelets: 366 10*3/uL (ref 150–400)
RBC: 3.77 MIL/uL — ABNORMAL LOW (ref 3.87–5.11)
RDW: 12.7 % (ref 11.5–15.5)
WBC: 7.4 10*3/uL (ref 4.0–10.5)
nRBC: 0 % (ref 0.0–0.2)

## 2024-02-11 LAB — BASIC METABOLIC PANEL WITH GFR
Anion gap: 10 (ref 5–15)
BUN: 10 mg/dL (ref 8–23)
CO2: 28 mmol/L (ref 22–32)
Calcium: 9.3 mg/dL (ref 8.9–10.3)
Chloride: 103 mmol/L (ref 98–111)
Creatinine, Ser: 0.74 mg/dL (ref 0.44–1.00)
GFR, Estimated: >60 mL/min (ref >60)
Glucose, Bld: 122 mg/dL — ABNORMAL HIGH (ref 70–99)
Potassium: 3.4 mmol/L — ABNORMAL LOW (ref 3.5–5.1)
Sodium: 141 mmol/L (ref 135–145)

## 2024-02-11 LAB — URINALYSIS, W/ REFLEX TO CULTURE (INFECTION SUSPECTED)
Bacteria, UA: NONE SEEN
Bilirubin Urine: NEGATIVE
Glucose, UA: NEGATIVE mg/dL
Hgb urine dipstick: NEGATIVE
Ketones, ur: NEGATIVE mg/dL
Leukocytes,Ua: NEGATIVE
Nitrite: NEGATIVE
Protein, ur: NEGATIVE mg/dL
Specific Gravity, Urine: 1.005 (ref 1.005–1.030)
pH: 8 (ref 5.0–8.0)

## 2024-02-11 MED ORDER — METHYLPREDNISOLONE SODIUM SUCC 125 MG IJ SOLR
125.0000 mg | Freq: Once | INTRAMUSCULAR | Status: AC
  Administered 2024-02-11: 125 mg via INTRAVENOUS
  Filled 2024-02-11: qty 2

## 2024-02-11 MED ORDER — FAMOTIDINE IN NACL 20-0.9 MG/50ML-% IV SOLN
20.0000 mg | Freq: Once | INTRAVENOUS | Status: AC
  Administered 2024-02-11: 20 mg via INTRAVENOUS
  Filled 2024-02-11: qty 50

## 2024-02-11 MED ORDER — DIAZEPAM 5 MG/ML IJ SOLN
2.5000 mg | Freq: Once | INTRAMUSCULAR | Status: AC
  Administered 2024-02-11: 2.5 mg via INTRAVENOUS
  Filled 2024-02-11: qty 2

## 2024-02-11 MED ORDER — DIPHENHYDRAMINE HCL 50 MG/ML IJ SOLN
25.0000 mg | Freq: Once | INTRAMUSCULAR | Status: AC
  Administered 2024-02-11: 25 mg via INTRAVENOUS
  Filled 2024-02-11: qty 1

## 2024-02-11 MED ORDER — EPINEPHRINE 0.3 MG/0.3ML IJ SOAJ
0.3000 mg | Freq: Once | INTRAMUSCULAR | Status: DC
  Filled 2024-02-11: qty 0.3

## 2024-02-11 MED ORDER — KETOROLAC TROMETHAMINE 15 MG/ML IJ SOLN
15.0000 mg | Freq: Once | INTRAMUSCULAR | Status: AC
Start: 2024-02-11 — End: 2024-02-11
  Administered 2024-02-11: 15 mg via INTRAVENOUS
  Filled 2024-02-11: qty 1

## 2024-02-11 MED ORDER — METHOCARBAMOL 1000 MG/10ML IJ SOLN
500.0000 mg | Freq: Once | INTRAMUSCULAR | Status: AC
  Administered 2024-02-11: 500 mg via INTRAMUSCULAR
  Filled 2024-02-11: qty 10

## 2024-02-11 MED ORDER — BACLOFEN 10 MG PO TABS: 15.0000 mg | ORAL_TABLET | Freq: Three times a day (TID) | ORAL | 0 refills | Status: AC

## 2024-02-11 NOTE — ED Triage Notes (Addendum)
 Pt BIBA from home for 'slide to floor'. No head trauma, states she does not quite remember what happened when she was down on the floor and wet herself. Landed on right shoulder which just had a replacement on 5/6. Pain to site. 1 episode emesis d/t pain. Reports feeling loopy from prescribed oxy.   170/82 Hr 68 95% RA 153 cbg

## 2024-02-11 NOTE — Discharge Instructions (Signed)
 Today you were seen for a fall.  Please do not take your oxycodone .  We have increased your baclofen  for your muscle spasms.  Please return to the ED if you have worsening pain, fever, or uncontrollable vomiting.  Thank you for letting us  treat you today. After reviewing your labs and imaging, I feel you are safe to go home. Please follow up with your PCP in the next several days and provide them with your records from this visit. Return to the Emergency Room if pain becomes severe or symptoms worsen.

## 2024-02-11 NOTE — ED Provider Notes (Addendum)
 [place] [facility] DEPARTMENT AT [facility] Provider Note   CSN: [id number] Arrival date & time: 02/11/24  0434     History  Chief Complaint  Patient presents with   Fall   Shoulder Pain    Amanda Lambert is a 78 y.o. female 13 days status post right reverse arthroplasty of the shoulder who presents after low mechanism mechanical fall at home.  States that she got up to go to the bathroom, slid to the floor at the edge of her bed.  Did not fall.  Pain only in her right arm.  According to family at the bedside she has become somewhat confused since her surgery, which they believed to be secondary to her prescribed pain medications.  Patient complaining only of muscle spasms in the right arm at this time.  At presentation to bedside patient is found to have extremely swollen lips and complaining of a dry mouth and itchy throat.  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  Was placed on a nonrebreather mask with the ambulance due to somnolence, question possible reaction to latex from the mask.  No nausea vomiting lightheadedness at this time.  Full 5 caveat due to acuity of presentation unremarkable. HPI     Home Medications Prior to Admission medications   Medication Sig Start Date End Date Taking? Authorizing Provider  calcitonin, salmon, (MIACALCIN /FORTICAL) 200 UNIT/ACT nasal spray Place 1 spray into alternate nostrils daily. 09/03/23   Lorita Rosa, MD  cyclobenzaprine  (FLEXERIL ) 10 MG tablet Take 1 tablet (10 mg total) by mouth 3 (three) times daily as needed for muscle spasms. 01/22/24   Noble Bateman, PA  EPINEPHrine  0.3 mg/0.3 mL IJ SOAJ injection Inject 0.3 mg into the muscle once as needed for anaphylaxis.    [provider]  esomeprazole (NEXIUM) 40 MG capsule Take 40 mg by mouth in the morning.    [provider]  estradiol  (ESTRACE ) 0.1 MG/GM vaginal cream Place 1 g vaginally 3 (three) times a  week. 07/02/23   Lacey Pian, MD  furosemide  (LASIX ) 20 MG tablet Take 1 tablet (20 mg total) by mouth daily as needed. 10/30/23   Knox Perl, MD  gabapentin  (NEURONTIN ) 300 MG capsule Take 300 mg by mouth 2 (two) times daily. 02/14/21   [provider]  isosorbide  mononitrate (IMDUR ) 120 MG 24 hr tablet Take 1 tablet (120 mg total) by mouth daily. 08/03/23   Tania Familia, NP  lidocaine  (LIDODERM ) 5 % Place 1 patch onto the skin daily. Remove & Discard patch within 12 hours or as directed by MD 09/02/23   Lorita Rosa, MD  magnesium 30 MG tablet Take 30 mg by mouth 2 (two) times daily.    [provider]  nitroGLYCERIN  (NITROSTAT ) 0.4 MG SL tablet Place 1 tablet (0.4 mg total) under the tongue every 5 (five) minutes as needed for up to 25 days for chest pain. 08/03/23 01/22/24  Tania Familia, NP  ondansetron  (ZOFRAN -ODT) 4 MG disintegrating tablet Take 1 tablet (4 mg total) by mouth every 8 (eight) hours as needed for nausea or vomiting. 01/29/24   Franaszek, Amanda, PA-C  potassium chloride  SA (KLOR-CON  M20) 20 MEQ tablet TAKE 1 TABLET BY MOUTH EVERY DAY 10/30/23   Knox Perl, MD  Probiotic Product (ALIGN PO) Take by mouth.    [provider]  propranolol  (INDERAL ) 20 MG tablet Take 1 tablet (20 mg total) by mouth 3 (three) times daily. 07/16/23  Knox Perl, MD  rosuvastatin  (CRESTOR ) 10 MG tablet TAKE 1 TABLET BY MOUTH EVERY DAY 06/29/23   Knox Perl, MD  VITAMIN D  PO Take 1 capsule by mouth every other day. In the morning    [provider]      Allergies    Ace inhibitors, Bee venom, Thorazine [chlorpromazine], Wasp venom, Levaquin  [levofloxacin  in d5w], Shrimp [shellfish allergy], Amlodipine , Atropine sulfate, Elemental sulfur, Glycopyrrolate , Ramipril, Sulfa antibiotics, Compazine [prochlorperazine edisylate], and Latex    Review of Systems   Review of Systems  Unable to perform ROS: Acuity of condition    Physical Exam Updated Vital Signs BP  (!) 175/80   Pulse 67   Temp (!) 97.5 F (36.4 C) (Axillary)   SpO2 100%  Physical Exam Vitals and nursing note reviewed.  Constitutional:      Appearance: She is not ill-appearing or toxic-appearing.  HENT:     Head: Normocephalic and atraumatic.     Mouth/Throat:     Mouth: Mucous membranes are moist. Angioedema present.     Pharynx: Uvula midline. No oropharyngeal exudate or posterior oropharyngeal erythema.     Comments: No sublingual or submental TTP. Eyes:     General: Lids are normal. Vision grossly intact.        Right eye: No discharge.        Left eye: No discharge.     Extraocular Movements: Extraocular movements intact.     Conjunctiva/sclera: Conjunctivae normal.     Pupils: Pupils are equal, round, and reactive to light.  Neck:     Trachea: Trachea and phonation normal.  Cardiovascular:     Rate and Rhythm: Normal rate and regular rhythm.     Pulses: Normal pulses.     Heart sounds: Normal heart sounds. No murmur heard. Pulmonary:     Effort: Pulmonary effort is normal. No tachypnea, bradypnea, prolonged expiration or respiratory distress.     Breath sounds: Normal breath sounds. No wheezing or rales.  Chest:     Chest wall: No mass, lacerations, deformity, swelling, tenderness, crepitus or edema.  Abdominal:     General: Bowel sounds are normal. There is no distension.     Palpations: Abdomen is soft.     Tenderness: There is no abdominal tenderness. There is no right CVA tenderness, left CVA tenderness, guarding or rebound.  Musculoskeletal:        General: No deformity.       Arms:     Cervical back: Normal range of motion and neck supple.     Right lower leg: No edema.     Left lower leg: No edema.     Comments: No pelvic instability, nocrepitance to the chest.   Lymphadenopathy:     Cervical: No cervical adenopathy.  Skin:    General: Skin is warm and dry.     Capillary Refill: Capillary refill takes less than 2 seconds.  Neurological:      General: No focal deficit present.     Mental Status: She is alert and oriented to person, place, and time. Mental status is at baseline.  Psychiatric:        Mood and Affect: Mood normal.     ED Results / Procedures / Treatments   Labs (all labs ordered are listed, but only abnormal results are displayed) Labs Reviewed  CBC WITH DIFFERENTIAL/PLATELET - Abnormal; Notable for the following components:      Result Value   RBC 3.77 (*)    Hemoglobin 11.1 (*)  HCT 35.3 (*)    All other components within normal limits  BASIC METABOLIC PANEL WITH GFR - Abnormal; Notable for the following components:   Potassium 3.4 (*)    Glucose, Bld 122 (*)    All other components within normal limits  URINALYSIS, W/ REFLEX TO CULTURE (INFECTION SUSPECTED) - Abnormal; Notable for the following components:   Color, Urine STRAW (*)    All other components within normal limits    EKG None  Radiology DG Chest Port 1 View Result Date: 02/11/2024 CLINICAL DATA:  Fall injury. EXAM: PORTABLE CHEST 1 VIEW COMPARISON:  PA and lateral chest 10/17/2019 FINDINGS: The heart is upper limit of normal in size. No vascular congestion is seen. The mediastinum is normally outlined. There is aortic atherosclerosis. The lungs are clear. There is a new right shoulder reverse arthroplasty since the prior chest x-ray. In all other respects there are no further changes. IMPRESSION: 1. No evidence of acute chest disease. Aortic atherosclerosis. 2. New right shoulder reverse arthroplasty since 2021. Electronically Signed   By: Denman Fischer M.D.   On: 02/11/2024 06:07   DG Shoulder Right Portable Result Date: 02/11/2024 CLINICAL DATA:  Status post fall. EXAM: RIGHT SHOULDER - 1 VIEW COMPARISON:  CT 01/29/2024. FINDINGS: There has been interval right shoulder total reverse arthroplasty. Hardware components appear to be an anatomic alignment. Fragmentation about the right humeral neck reflecting recent impacted fracture deformity  prior to arthroplasty. No signs of dislocation or acute periprosthetic fracture. IMPRESSION: Status post right shoulder total reverse arthroplasty. No acute findings. Electronically Signed   By: Kimberley Penman M.D.   On: 02/11/2024 06:05    Procedures Procedures    Medications Ordered in ED Medications  EPINEPHrine  (EPI-PEN) injection 0.3 mg (0 mg Intramuscular Hold 02/11/24 0547)  diphenhydrAMINE  (BENADRYL ) injection 25 mg (25 mg Intravenous Given 02/11/24 0546)  methylPREDNISolone  sodium succinate (SOLU-MEDROL ) 125 mg/2 mL injection 125 mg (125 mg Intravenous Given 02/11/24 0546)  famotidine  (PEPCID ) IVPB 20 mg premix (20 mg Intravenous New Bag/Given 02/11/24 0546)    ED Course/ Medical Decision Making/ A&P                                 Medical Decision Making 78 y/o female who presents with concern for fall, shoulder pain, transient episodes of confusion, also presented with acute allergic reaction.   HTN on inake, cardiopulmonary exam is unremarkable, abdominal exam is benign.  Angioedema versus allergic reaction.surgical wound to the right shoulder.     Amount and/or Complexity of Data Reviewed Labs: ordered.    Details: CBC with anemia hemoglobin 11, BMP with mild hypokalemia 3.4, UA without evidence of infection.  Radiology: ordered.    Details:  Chest x-ray negative for acute cardiopulmonary disease or injury, some of the right shoulder with recent surgical changes but no acute traumatic injury.   Risk Prescription drug management.   Patient signed out pending reevaluation of allergic reaction.Care of this patient signed out to oncoming ED provider Euell Herrlich, PA-C at time of shift change. All Pertinent HPI, physical exam, and laboratory findings were discussed with them prior to my departure. Disposition of patient pending completion of workup, reevaluation, and clinical judgement of oncoming ED provider.   This chart was dictated using voice recognition software, Dragon.  Despite the best efforts of this provider to proofread and correct errors, errors may still occur which can change documentation meaning.  Final Clinical Impression(s) / ED Diagnoses Final diagnoses:  None    Rx / DC Orders ED Discharge Orders     None         Kae Oram, PA-C 02/11/24 0701    Shine Scrogham, Adelle Agent, PA-C 02/11/24 0708    Ballard Bongo, MD 02/13/24 [phone]

## 2024-02-11 NOTE — ED Provider Notes (Signed)
 Patient signed out to myself at shift change by Jarvis Mesa, PA-C pending reevaluation, observation for further allergic reaction, and likely discharge. Please refer to their note for full HPI, ROS, PE, and clinical course.   Patient is a 78 year old female status post right shoulder replacement on 6 May.  Patient brought in from home for "sliding to the floor".  Patient has been taking oxycodone  for postop pain and reports feeling loopy due to this.  Patient was given oxygen by EMS and secondary to allergy to latex had an allergic reaction from the oxygen mask.  Patient did have some mild angioedema with itchy, dry mouth.  Patient given Benadryl , Pepcid , and Solu-Medrol .  Family at bedside.  Physical Exam  BP (!) 149/134   Pulse 71   Temp 98.8 F (37.1 C) (Oral)   Resp 20   SpO2 98%   Physical Exam Vitals and nursing note reviewed.  Constitutional:      General: She is not in acute distress.    Appearance: She is well-developed.  HENT:     Head: Normocephalic and atraumatic.     Right Ear: External ear normal.     Left Ear: External ear normal.     Nose: Nose normal.     Mouth/Throat:     Mouth: Mucous membranes are dry.     Pharynx: Uvula midline.     Comments: Mild angioedema to patient's lips and tongue. Eyes:     Conjunctiva/sclera: Conjunctivae normal.  Cardiovascular:     Rate and Rhythm: Normal rate and regular rhythm.     Pulses: Normal pulses.     Heart sounds: Normal heart sounds. No murmur heard. Pulmonary:     Effort: Pulmonary effort is normal. No respiratory distress.     Breath sounds: Normal breath sounds.  Abdominal:     Palpations: Abdomen is soft.     Tenderness: There is no abdominal tenderness.  Musculoskeletal:        General: No swelling.     Cervical back: Neck supple.  Skin:    General: Skin is warm and dry.     Capillary Refill: Capillary refill takes less than 2 seconds.  Neurological:     General: No focal deficit present.     Mental  Status: She is alert.  Psychiatric:        Mood and Affect: Mood normal.     Procedures  Procedures  ED Course / MDM    Medical Decision Making Amount and/or Complexity of Data Reviewed Labs: ordered. Radiology: ordered.  Risk Prescription drug management.   Patient complains of right shoulder muscle spasms, per patient's son she has not taken her postop muscle relaxer since early yesterday evening.  500mg  IM Robaxin  ordered at this time.  Spoke with patient's orthopedist, Dr. Agatha Horsfall about increasing baclofen  as patient is not tolerating oxycodone .  He is agreeable to plan.  Patient able to ambulate with minimal assistance.  Consider for admission or further workup however patient's vital signs, physical exam, labs, and imaging of been reassuring.  Patient able to ambulate with minimal assistance.  Patient advised to stop taking oxycodone  as we feel this may have contributed to her fall.  Patient's baclofen  has been increased to 15 mg 3 times daily.  I feel patient is safe for discharge at this time.     Carie Charity, PA-C 02/11/24 1121    Ballard Bongo, MD 02/13/24 [phone]

## 2024-02-12 ENCOUNTER — Ambulatory Visit: Admitting: Neurosurgery

## 2024-02-13 DIAGNOSIS — S42201D Unspecified fracture of upper end of right humerus, subsequent encounter for fracture with routine healing: Secondary | ICD-10-CM | POA: Diagnosis not present

## 2024-02-18 ENCOUNTER — Encounter: Payer: Self-pay | Admitting: *Deleted

## 2024-02-18 ENCOUNTER — Ambulatory Visit
Admission: EM | Admit: 2024-02-18 | Discharge: 2024-02-18 | Disposition: A | Discharge status: Home / Self Care | Attending: Emergency Medicine | Admitting: Emergency Medicine

## 2024-02-18 DIAGNOSIS — N390 Urinary tract infection, site not specified: Secondary | ICD-10-CM | POA: Insufficient documentation

## 2024-02-18 LAB — URINALYSIS, W/ REFLEX TO CULTURE (INFECTION SUSPECTED): WBC, UA: >50 WBC/hpf (ref 0–5)

## 2024-02-18 MED ORDER — CEFTRIAXONE SODIUM 1 G IJ SOLR
1.0000 g | Freq: Once | INTRAMUSCULAR | Status: AC
  Administered 2024-02-18: 1 g via INTRAMUSCULAR

## 2024-02-18 NOTE — ED Triage Notes (Signed)
 Patient states 2 days of painful urination and frequency, no fever, has been taking Azo

## 2024-02-18 NOTE — Discharge Instructions (Addendum)
 Rest Push fluids We treated you for UTI with Rocephin , follow-up with your urologist-call for appointment If you have new or worsening symptoms go to emergency room for further evaluation

## 2024-02-18 NOTE — ED Provider Notes (Signed)
 [facility]    CSN: [id number] Arrival date & time: 02/18/24  1251      History   Chief Complaint Chief Complaint  Patient presents with   Urinary Frequency   Dysuria    HPI Amanda Lambert is a 78 y.o. female.   78 year old female, Amanda Lambert, presents to urgent care for evaluation of painful urination frequency x 2 days.  Patient denies any fever, nausea, or vomiting, has been taken Azo.  PMH: Htn, Gerd, CKD, CAD, UTI(last ~1 month prior treated w keflex ), back pain  The history is provided by the patient. No language interpreter was used.    Past Medical History:  Diagnosis Date   Anemia    Angina pectoris (HCC)    Aortic atherosclerosis (HCC)    Arthritis    Bilateral carotid artery disease (HCC) 10/15/2019   a.) carotid doppler 10/15/2019: 50-69% BICA; b.) carotid doppler 04/06/2020: 50-69% RICA, 1-15% LICA; c.) carotid doppler 11/03/2020 and 04/29/2021: 50-69% RICA, 16-49% LICA; d.) carotid doppler 03/02/2022: 50-69% BICA; e.) carotid doppler 03/14/2023: 50-69% RICA, 16-49% LICA   Biliary dyskinesia    CAD (coronary artery disease) 06/14/2021   a.) LHC 11/04/2019: 50% mLAD - med mgmt; b.) LHC/PCI 06/14/2021: 40% p-mLAD, 80% mLAD (3.0 x 30 mm Onyx Frontier DES; c.) LHC for FFR study: 07/19/2021: 40% pLAD; RFR 0.92, FFR 0.89, CFR 4.8, IMR 18 --> no significant macro/microvascular disease   CKD (chronic kidney disease), stage III (HCC)    Complication of anesthesia    a.) PONV; b.) delayed emergence   Constipation    Diastolic dysfunction 10/15/2019   a.) TTE 10/15/2019: EF 50-55%, norm LAP, mild LA dil, mod MR, mild-mod TR, G1DD; b.) TTE 05/19/2021: EF 50-55%, mild basal sep hypertrophy, norm LAP, triv MR/TR, G1DD   GERD (gastroesophageal reflux disease)    Hepatitis B 1975   Hypercholesteremia    Hypertension    Inguinal hernia    Irritable bowel syndrome without diarrhea    LBBB (left bundle branch block)    Leukopenia    Long term current use of  aspirin     PONV (postoperative nausea and vomiting)    [facility] spotted fever 04/01/2012   adm. to hospital   Syncope 10/17/2019   Thrombocytopenia (HCC)    Transaminitis    Trochanteric bursitis, right hip    Vaginal Pap smear, abnormal    Varicose veins of leg with edema, bilateral    Weakness     Patient Active Problem List   Diagnosis Date Noted   Compression fracture of L2 lumbar vertebra, open, initial encounter (HCC) 08/31/2023   Compression fracture of T12 vertebra (HCC) 08/31/2023   Lumbar compression fracture (HCC) 08/30/2023   CHF (congestive heart failure) (HCC) 04/17/2023   Coronary artery disease of native artery of native heart with stable angina pectoris (HCC) 07/04/2021   Hypercholesteremia 07/04/2021   LAD stenosis 06/13/2021   Angina pectoris (HCC) 11/03/2019   Biliary dyskinesia 04/09/2012   Transaminitis 04/01/2012   Recurrent UTI 04/01/2012   HTN (hypertension) 04/01/2012   GERD (gastroesophageal reflux disease) 04/01/2012    Past Surgical History:  Procedure Laterality Date   APPENDECTOMY  09/25/1961   CHOLECYSTECTOMY  04/26/2012   Procedure: LAPAROSCOPIC CHOLECYSTECTOMY WITH INTRAOPERATIVE CHOLANGIOGRAM;  Surgeon: Mayme Spearman, MD;  Location: [facility];  Service: General;  Laterality: N/A;   COLONOSCOPY     CORONARY ANGIOGRAPHY N/A 07/19/2021   Procedure: CORONARY ANGIOGRAPHY (CATH LAB);  Surgeon: Knox Perl, MD;  Location: [facility]  INVASIVE CV LAB;  Service: Cardiovascular;  Laterality: N/A;   CORONARY IMAGING/OCT N/A 06/14/2021   Procedure: INTRAVASCULAR IMAGING/OCT;  Surgeon: Knox Perl, MD;  Location: [facility] INVASIVE CV LAB;  Service: Cardiovascular;  Laterality: N/A;   CORONARY PRESSURE/FFR STUDY N/A 07/19/2021   Procedure: INTRAVASCULAR PRESSURE WIRE/FFR STUDY;  Surgeon: Knox Perl, MD;  Location: [facility] INVASIVE CV LAB;  Service: Cardiovascular;  Laterality: N/A;   CORONARY STENT INTERVENTION N/A 06/14/2021   Procedure: CORONARY STENT INTERVENTION;   Surgeon: Knox Perl, MD;  Location: [facility] INVASIVE CV LAB;  Service: Cardiovascular;  Laterality: N/A;   DILATION AND CURETTAGE OF UTERUS  09/25/1980   for miscarriage   DILATION AND CURETTAGE, DIAGNOSTIC / THERAPEUTIC  09/25/1970   INSERTION OF MESH Right 05/03/2023   Procedure: INSERTION OF MESH;  Surgeon: Conrado Delay, DO;  Location: [facility] ORS;  Service: General;  Laterality: Right;   IR KYPHO EA ADDL LEVEL THORACIC OR LUMBAR  09/05/2023   IR KYPHO LUMBAR INC FX REDUCE BONE BX UNI/BIL CANNULATION INC/IMAGING  09/05/2023   IR KYPHO THORACIC WITH BONE BIOPSY  09/05/2023   LEFT HEART CATH AND CORONARY ANGIOGRAPHY N/A 11/04/2019   Procedure: LEFT HEART CATH AND CORONARY ANGIOGRAPHY;  Surgeon: Knox Perl, MD;  Location: [facility] INVASIVE CV LAB;  Service: Cardiovascular;  Laterality: N/A;   LEFT HEART CATH AND CORONARY ANGIOGRAPHY N/A 06/14/2021   Procedure: LEFT HEART CATH AND CORONARY ANGIOGRAPHY;  Surgeon: Knox Perl, MD;  Location: [facility] INVASIVE CV LAB;  Service: Cardiovascular;  Laterality: N/A;   TONSILLECTOMY  1964  - approximate   TOTAL SHOULDER ARTHROPLASTY Right 01/28/2024   WRIST FRACTURE SURGERY  09/25/2006   right    OB History     Gravida  2   Para      Term      Preterm      AB  1   Living  1      SAB  1   IAB      Ectopic      Multiple      Live Births  1            Home Medications    Prior to Admission medications   Medication Sig Start Date End Date Taking? Authorizing Provider  traMADol (ULTRAM) 50 MG tablet Take 50 mg by mouth every 6 (six) hours as needed. 02/13/24  Yes [provider]  baclofen  (LIORESAL ) 10 MG tablet Take 1.5 tablets (15 mg total) by mouth 3 (three) times daily for 10 days. 02/11/24 02/21/24  Keith, Kayla N, PA-C  calcitonin, salmon, (MIACALCIN Marciano Settles) 200 UNIT/ACT nasal spray Place 1 spray into alternate nostrils daily. 09/03/23   Lorita Rosa, MD  cyclobenzaprine  (FLEXERIL ) 10 MG tablet Take 1 tablet (10 mg total) by  mouth 3 (three) times daily as needed for muscle spasms. 01/22/24   Noble Bateman, PA  EPINEPHrine  0.3 mg/0.3 mL IJ SOAJ injection Inject 0.3 mg into the muscle once as needed for anaphylaxis.    [provider]  esomeprazole (NEXIUM) 40 MG capsule Take 40 mg by mouth in the morning.    [provider]  estradiol  (ESTRACE ) 0.1 MG/GM vaginal cream Place 1 g vaginally 3 (three) times a week. 07/02/23   Lacey Pian, MD  furosemide  (LASIX ) 20 MG tablet Take 1 tablet (20 mg total) by mouth daily as needed. 10/30/23   Knox Perl, MD  gabapentin  (NEURONTIN ) 300 MG capsule Take 300 mg by mouth 2 (two) times daily. 02/14/21   [provider]  isosorbide  mononitrate (IMDUR ) 120 MG 24 hr tablet Take 1 tablet (120 mg total) by mouth daily. 08/03/23   Tania Familia, NP  lidocaine  (LIDODERM ) 5 % Place 1 patch onto the skin daily. Remove & Discard patch within 12 hours or as directed by MD 09/02/23   Lorita Rosa, MD  magnesium 30 MG tablet Take 30 mg by mouth 2 (two) times daily.    [provider]  nitroGLYCERIN  (NITROSTAT ) 0.4 MG SL tablet Place 1 tablet (0.4 mg total) under the tongue every 5 (five) minutes as needed for up to 25 days for chest pain. 08/03/23 01/22/24  Tania Familia, NP  ondansetron  (ZOFRAN -ODT) 4 MG disintegrating tablet Take 1 tablet (4 mg total) by mouth every 8 (eight) hours as needed for nausea or vomiting. 01/29/24   Rexie Catena, PA-C  potassium chloride  SA (KLOR-CON  M20) 20 MEQ tablet TAKE 1 TABLET BY MOUTH EVERY DAY 10/30/23   Knox Perl, MD  Probiotic Product (ALIGN PO) Take by mouth.    [provider]  propranolol  (INDERAL ) 20 MG tablet Take 1 tablet (20 mg total) by mouth 3 (three) times daily. 07/16/23   Knox Perl, MD  rosuvastatin  (CRESTOR ) 10 MG tablet TAKE 1 TABLET BY MOUTH EVERY DAY 06/29/23   Knox Perl, MD  VITAMIN D  PO Take 1 capsule by mouth every other day. In the morning    [provider]    Family  History Family History  Problem Relation Age of Onset   Cardiomyopathy Mother    Coronary artery disease Father    Hypertension Brother    Prostate cancer Brother     Social History Social History   Tobacco Use   Smoking status: Never   Smokeless tobacco: Never  Vaping Use   Vaping status: Never Used  Substance Use Topics   Alcohol use: No   Drug use: No     Allergies   Ace inhibitors, Bee venom, Thorazine [chlorpromazine], Wasp venom, Levaquin  [levofloxacin  in d5w], Shrimp [shellfish allergy], Amlodipine , Atropine sulfate, Elemental sulfur, Glycopyrrolate , Ramipril, Sulfa antibiotics, Compazine [prochlorperazine edisylate], and Latex   Review of Systems Review of Systems  Constitutional:  Negative for chills and fever.  Gastrointestinal:  Negative for abdominal pain, nausea and vomiting.  Genitourinary:  Positive for dysuria and frequency.  All other systems reviewed and are negative.    Physical Exam Triage Vital Signs ED Triage Vitals  Encounter Vitals Group     BP --      Systolic BP Percentile --      Diastolic BP Percentile --      Pulse --      Resp --      Temp --      Temp src --      SpO2 --      Weight 02/18/24 1321 180 lb (81.6 kg)     Height 02/18/24 1321 5\' 7"  (1.702 m)     Head Circumference --      Peak Flow --      Pain Score 02/18/24 1320 4     Pain Loc --      Pain Education --      Exclude from Growth Chart --    No data found.  Updated Vital Signs BP 115/72 (BP Location: Left Arm)   Pulse 75   Temp 98.4 F (36.9 C) (Oral)   Resp 20   Ht 5\' 7"  (1.702 m)   Wt 180 lb (81.6 kg)   SpO2  94%   BMI 28.19 kg/m   Visual Acuity Right Eye Distance:   Left Eye Distance:   Bilateral Distance:    Right Eye Near:   Left Eye Near:    Bilateral Near:     Physical Exam Vitals and nursing note reviewed.  Constitutional:      General: She is not in acute distress.    Appearance: Normal appearance. She is well-developed and  well-groomed.  HENT:     Head: Normocephalic.  Eyes:     Pupils: Pupils are equal, round, and reactive to light.  Cardiovascular:     Rate and Rhythm: Normal rate and regular rhythm.  Pulmonary:     Effort: Pulmonary effort is normal.     Breath sounds: Normal breath sounds.  Abdominal:     General: Bowel sounds are normal.     Tenderness: There is abdominal tenderness in the suprapubic area.  Musculoskeletal:        General: Normal range of motion.     Cervical back: Normal range of motion.  Skin:    General: Skin is warm and dry.  Neurological:     Mental Status: She is alert and oriented to person, place, and time.     GCS: GCS eye subscore is 4. GCS verbal subscore is 5. GCS motor subscore is 6.  Psychiatric:        Speech: Speech normal.        Behavior: Behavior normal. Behavior is cooperative.      UC Treatments / Results  Labs (all labs ordered are listed, but only abnormal results are displayed) Labs Reviewed  URINALYSIS, W/ REFLEX TO CULTURE (INFECTION SUSPECTED) - Abnormal; Notable for the following components:      Result Value   Color, Urine ORANGE (*)    APPearance CLOUDY (*)    Glucose, UA   (*)    Value: TEST NOT REPORTED DUE TO COLOR INTERFERENCE OF URINE PIGMENT   Hgb urine dipstick   (*)    Value: TEST NOT REPORTED DUE TO COLOR INTERFERENCE OF URINE PIGMENT   Bilirubin Urine   (*)    Value: TEST NOT REPORTED DUE TO COLOR INTERFERENCE OF URINE PIGMENT   Ketones, ur   (*)    Value: TEST NOT REPORTED DUE TO COLOR INTERFERENCE OF URINE PIGMENT   Protein, ur   (*)    Value: TEST NOT REPORTED DUE TO COLOR INTERFERENCE OF URINE PIGMENT   Nitrite   (*)    Value: TEST NOT REPORTED DUE TO COLOR INTERFERENCE OF URINE PIGMENT   Leukocytes,Ua   (*)    Value: TEST NOT REPORTED DUE TO COLOR INTERFERENCE OF URINE PIGMENT   Bacteria, UA MANY (*)    All other components within normal limits  URINE CULTURE    EKG   Radiology No results  found.  Procedures Procedures (including critical care time)  Medications Ordered in UC Medications  cefTRIAXone  (ROCEPHIN ) injection 1 g (1 g Intramuscular Given 02/18/24 1404)    Initial Impression / Assessment and Plan / UC Course  I have reviewed the triage vital signs and the nursing notes.  Pertinent labs & imaging results that were available during my care of the patient were reviewed by me and considered in my medical decision making (see chart for details).    Discussed exam findings and plan of care with patient, reviewed previous urine culture w Rocephin  sensitivity for E coli, patient treated with Rocephin  1 g IM for UTI , strict  go to ER precautions given.   Patient verbalized understanding to this provider.  Ddx: Recurrent UTI, dysuria, urinary frequency Final Clinical Impressions(s) / UC Diagnoses   Final diagnoses:  Recurrent UTI     Discharge Instructions      Rest Push fluids We treated you for UTI with Rocephin , follow-up with your urologist-call for appointment If you have new or worsening symptoms go to emergency room for further evaluation    ED Prescriptions   None    PDMP not reviewed this encounter.   Peter Brands, NP 02/18/24 [phone]

## 2024-02-20 ENCOUNTER — Ambulatory Visit (HOSPITAL_COMMUNITY): Payer: Self-pay

## 2024-02-20 ENCOUNTER — Ambulatory Visit: Payer: Medicare Other | Admitting: Cardiology

## 2024-02-20 LAB — URINE CULTURE: Culture: >=100000 — AB

## 2024-02-21 MED ORDER — CEFIXIME 400 MG PO CAPS: 400.0000 mg | ORAL_CAPSULE | Freq: Every day | ORAL | 0 refills | Status: AC

## 2024-02-25 MED ORDER — FLUCONAZOLE 150 MG PO TABS: 150.0000 mg | ORAL_TABLET | Freq: Once | ORAL | 0 refills | Status: AC

## 2024-03-06 ENCOUNTER — Ambulatory Visit: Attending: Cardiology | Admitting: Cardiology

## 2024-03-06 ENCOUNTER — Encounter: Payer: Self-pay | Admitting: Cardiology

## 2024-03-06 VITALS — BP 100/66 | HR 80 | Ht 67.0 in | Wt 167.0 lb

## 2024-03-06 DIAGNOSIS — E7800 Pure hypercholesterolemia, unspecified: Secondary | ICD-10-CM | POA: Insufficient documentation

## 2024-03-06 DIAGNOSIS — I447 Left bundle-branch block, unspecified: Secondary | ICD-10-CM | POA: Diagnosis not present

## 2024-03-06 DIAGNOSIS — I25118 Atherosclerotic heart disease of native coronary artery with other forms of angina pectoris: Secondary | ICD-10-CM | POA: Insufficient documentation

## 2024-03-06 DIAGNOSIS — I6523 Occlusion and stenosis of bilateral carotid arteries: Secondary | ICD-10-CM | POA: Diagnosis not present

## 2024-03-06 NOTE — Progress Notes (Signed)
 Cardiology Office Note:  .   Date:  03/06/2024  ID:  Granville Layer, DOB 16-Apr-1946, MRN [id number] PCP: Bartlett Boroughs, PA  [facility] Providers Cardiologist:  Knox Perl, MD   History of Present Illness: .   Amanda Lambert is a 78 y.o.  patient with long-standing history of LBBB, hypertension, chronic stage IIIa kidney disease, asymptomatic bilateral carotid stenosis, coronary artery disease SP LAD stenting on 06/14/2021, repeat Cardiac catheterization on 07/19/2021 revealing patent LAD stent but had microvascular disease by CFR/IMR.  Echocardiogram in 2021 estimated moderate TR.   This is an annual visit.  She had a mechanical fall and underwent right reverse arthroplasty of the shoulder in May 2025.  She has had several ED visits since then including for UTI.  Presently feeling the best she has in a while, is excited to resume rehab for her shoulder and back injury.  Discussed the use of AI scribe software for clinical note transcription with the patient, who gave verbal consent to proceed.  History of Present Illness   Amanda Lambert is a 78 year old female with coronary artery disease and left bundle branch block who presents for cardiovascular follow-up.She has coronary artery disease with stents in the LAD and experiences occasional angina, managed with nitroglycerin . The last angina episode was a couple of months ago, with no recent chest pain or nitroglycerin  use.  Otherwise she remains asymptomatic.  Her left bundle branch block is stable. An echocardiogram in 2022 showed normal heart function. She reports no new cardiac symptoms. Carotid artery assessment a year ago showed 50-60% blockage in the right carotid and 15-45% in the left. She is scheduled for a follow-up assessment.     Labs   Lab Results  Component Value Date   CHOL 147 08/03/2023   HDL 88 08/03/2023   LDLCALC 44 08/03/2023   TRIG 78 08/03/2023   CHOLHDL 1.7 08/03/2023   Lab Results  Component Value  Date   NA 141 02/11/2024   K 3.4 (L) 02/11/2024   CO2 28 02/11/2024   GLUCOSE 122 (H) 02/11/2024   BUN 10 02/11/2024   CREATININE 0.74 02/11/2024   CALCIUM  9.3 02/11/2024   EGFR 64 08/03/2023   GFRNONAA >60 02/11/2024      Latest Ref Rng & Units 02/11/2024    5:48 AM 09/05/2023   12:16 PM 09/02/2023    3:16 AM  BMP  Glucose 70 - 99 mg/dL 308  657  846   BUN 8 - 23 mg/dL 10  10  11    Creatinine 0.44 - 1.00 mg/dL 9.62  9.52  8.41   Sodium 135 - 145 mmol/L 141  138  136   Potassium 3.5 - 5.1 mmol/L 3.4  3.6  3.6   Chloride 98 - 111 mmol/L 103  102  103   CO2 22 - 32 mmol/L 28  26  26    Calcium  8.9 - 10.3 mg/dL 9.3  9.5  8.6       Latest Ref Rng & Units 02/11/2024    5:48 AM 09/05/2023   12:16 PM 09/02/2023    3:16 AM  CBC  WBC 4.0 - 10.5 K/uL 7.4  5.9  5.0   Hemoglobin 12.0 - 15.0 g/dL 32.4  40.1  02.7   Hematocrit 36.0 - 46.0 % 35.3  36.8  32.7   Platelets 150 - 400 K/uL 366  270  248    No results found for: HGBA1C  Lab Results  Component  Value Date   TSH 1.540 06/30/2021    ROS  Review of Systems  Cardiovascular:  Negative for chest pain, dyspnea on exertion and leg swelling.    Physical Exam:   VS:  BP 100/66   Pulse 80   Ht 5' 7 (1.702 m)   Wt 167 lb (75.8 kg)   SpO2 97%   BMI 26.16 kg/m    Wt Readings from Last 3 Encounters:  03/06/24 167 lb (75.8 kg)  02/18/24 180 lb (81.6 kg)  01/29/24 180 lb (81.6 kg)    Physical Exam Neck:     Vascular: No carotid bruit or JVD.   Cardiovascular:     Rate and Rhythm: Normal rate and regular rhythm.     Pulses: Intact distal pulses.     Heart sounds: Normal heart sounds. No murmur heard.    No gallop.  Pulmonary:     Effort: Pulmonary effort is normal.     Breath sounds: Normal breath sounds.  Abdominal:     General: Bowel sounds are normal.     Palpations: Abdomen is soft.   Musculoskeletal:     Right lower leg: No edema.     Left lower leg: No edema.    Studies Reviewed: Aaron Aas    Left Heart  Catheterization 07/19/21:  06/14/2021, 3.0 x 30 mm Onyx is widely patent.     Carotid artery duplex 03/14/2023:  Duplex suggests stenosis in the right internal carotid artery (50-69%).  Duplex suggests stenosis in the left internal carotid artery (16-49%).  Antegrade right vertebral artery flow. Antegrade left vertebral artery flow.  Compared to the study done on 03/01/2022, no significant change. Follow up in six months is appropriate if clinically indicated.  EKG:       EKG 08/03/2023: Normal sinus rhythm at rate of 60 bpm, LBBB.  No further analysis.  No significant change from 07/19/2021. Personally reviewed  Medications    Current Outpatient Medications:    aspirin  EC 81 MG tablet, Take 81 mg by mouth daily. Swallow whole., Disp: , Rfl:    calcitonin, salmon, (MIACALCIN /FORTICAL) 200 UNIT/ACT nasal spray, Place 1 spray into alternate nostrils daily., Disp: 3.7 mL, Rfl: 12   cyclobenzaprine  (FLEXERIL ) 10 MG tablet, Take 1 tablet (10 mg total) by mouth 3 (three) times daily as needed for muscle spasms., Disp: 90 tablet, Rfl: 1   EPINEPHrine  0.3 mg/0.3 mL IJ SOAJ injection, Inject 0.3 mg into the muscle once as needed for anaphylaxis., Disp: , Rfl:    esomeprazole (NEXIUM) 40 MG capsule, Take 40 mg by mouth in the morning., Disp: , Rfl:    furosemide  (LASIX ) 20 MG tablet, Take 1 tablet (20 mg total) by mouth daily as needed., Disp: 90 tablet, Rfl: 3   gabapentin  (NEURONTIN ) 300 MG capsule, Take 300 mg by mouth 2 (two) times daily., Disp: , Rfl:    isosorbide  mononitrate (IMDUR ) 120 MG 24 hr tablet, Take 1 tablet (120 mg total) by mouth daily., Disp: 90 tablet, Rfl: 2   lidocaine  (LIDODERM ) 5 %, Place 1 patch onto the skin daily. Remove & Discard patch within 12 hours or as directed by MD, Disp: 30 patch, Rfl: 0   magnesium 30 MG tablet, Take 30 mg by mouth 2 (two) times daily., Disp: , Rfl:    nitroGLYCERIN  (NITROSTAT ) 0.4 MG SL tablet, Place 1 tablet (0.4 mg total) under the tongue every  5 (five) minutes as needed for up to 25 days for chest pain., Disp: 25 tablet, Rfl: 3  ondansetron  (ZOFRAN -ODT) 4 MG disintegrating tablet, Take 1 tablet (4 mg total) by mouth every 8 (eight) hours as needed for nausea or vomiting., Disp: 20 tablet, Rfl: 0   Probiotic Product (ALIGN PO), Take by mouth., Disp: , Rfl:    propranolol  (INDERAL ) 20 MG tablet, Take 1 tablet (20 mg total) by mouth 3 (three) times daily., Disp: 270 tablet, Rfl: 1   rosuvastatin  (CRESTOR ) 10 MG tablet, TAKE 1 TABLET BY MOUTH EVERY DAY, Disp: 90 tablet, Rfl: 3   traMADol (ULTRAM) 50 MG tablet, Take 50 mg by mouth every 6 (six) hours as needed., Disp: , Rfl:    VITAMIN D  PO, Take 1 capsule by mouth every other day. In the morning, Disp: , Rfl:    estradiol  (ESTRACE ) 0.1 MG/GM vaginal cream, Place 1 g vaginally 3 (three) times a week. (Patient not taking: Reported on 03/06/2024), Disp: 42.5 g, Rfl: 3   potassium chloride  SA (KLOR-CON  M20) 20 MEQ tablet, TAKE 1 TABLET BY MOUTH EVERY DAY (Patient not taking: Reported on 03/06/2024), Disp: 90 tablet, Rfl: 3  ASSESSMENT AND PLAN: .      ICD-10-CM   1. LBBB (left bundle branch block)  I44.7     2. Coronary artery disease of native artery of native heart with stable angina pectoris (HCC)  I25.118     3. Hypercholesterolemia  E78.00     4. Asymptomatic bilateral carotid artery stenosis  I65.23      Assessment and Plan    Coronary artery disease with chronic stable angina pectoris Coronary artery disease is well-managed. Occasional angina episodes are effectively controlled with nitroglycerin  as needed. No recent episodes of chest pain requiring nitroglycerin  in the past couple of months. - Continue nitroglycerin  as needed for angina - Continue aspirin  daily  Carotid artery stenosis Carotid artery stenosis with previous findings of 50-60% blockage in the right carotid artery and 15-45% in the left carotid artery. Asymptomatic with no bruits detected. Scheduled for follow-up  imaging to reassess carotid artery status. Potential for improvement due to cholesterol management. - Proceed with scheduled carotid artery imaging. - Suspect with aggressive management of her lipids which are now well-controlled on Crestor  10 mg daily.  Left bundle branch block Left bundle branch block is not causing any clinical symptoms. Echocardiogram in 2022 showed normal heart function with no evidence of heart failure. No intervention required at this time.  Hyperlipidemia Hyperlipidemia is well-controlled with current medication regimen.  External labs reviewed. - Continue Crestor  (rosuvastatin ) 10 mg daily     As she has remained stable, unless the carotid artery duplex reveals significant abnormality, I will see her back on a as needed basis.  Signed,  Knox Perl, MD, [facility] 03/06/2024, 6:31 PM [facility] [address] [place], [place] [address] Phone: [phone]. Fax:  [phone]

## 2024-03-06 NOTE — Patient Instructions (Signed)

## 2024-03-11 DIAGNOSIS — M25611 Stiffness of right shoulder, not elsewhere classified: Secondary | ICD-10-CM | POA: Diagnosis not present

## 2024-03-11 DIAGNOSIS — M25511 Pain in right shoulder: Secondary | ICD-10-CM | POA: Diagnosis not present

## 2024-03-11 DIAGNOSIS — R293 Abnormal posture: Secondary | ICD-10-CM | POA: Diagnosis not present

## 2024-03-11 DIAGNOSIS — Z96611 Presence of right artificial shoulder joint: Secondary | ICD-10-CM | POA: Diagnosis not present

## 2024-03-13 DIAGNOSIS — Z96611 Presence of right artificial shoulder joint: Secondary | ICD-10-CM | POA: Diagnosis not present

## 2024-03-13 DIAGNOSIS — M25511 Pain in right shoulder: Secondary | ICD-10-CM | POA: Diagnosis not present

## 2024-03-13 DIAGNOSIS — M25611 Stiffness of right shoulder, not elsewhere classified: Secondary | ICD-10-CM | POA: Diagnosis not present

## 2024-03-13 DIAGNOSIS — R293 Abnormal posture: Secondary | ICD-10-CM | POA: Diagnosis not present

## 2024-03-14 DIAGNOSIS — S42201D Unspecified fracture of upper end of right humerus, subsequent encounter for fracture with routine healing: Secondary | ICD-10-CM | POA: Diagnosis not present

## 2024-03-17 ENCOUNTER — Ambulatory Visit (HOSPITAL_COMMUNITY)
Admission: RE | Admit: 2024-03-17 | Discharge: 2024-03-17 | Disposition: A | Discharge status: Home / Self Care | Payer: BLUE CROSS/BLUE SHIELD | Source: Ambulatory Visit | Attending: Cardiology | Admitting: Cardiology

## 2024-03-17 DIAGNOSIS — I6523 Occlusion and stenosis of bilateral carotid arteries: Secondary | ICD-10-CM

## 2024-03-18 ENCOUNTER — Ambulatory Visit: Payer: Self-pay | Admitting: Cardiology

## 2024-03-18 DIAGNOSIS — M25511 Pain in right shoulder: Secondary | ICD-10-CM | POA: Diagnosis not present

## 2024-03-18 DIAGNOSIS — R293 Abnormal posture: Secondary | ICD-10-CM | POA: Diagnosis not present

## 2024-03-18 DIAGNOSIS — Z96611 Presence of right artificial shoulder joint: Secondary | ICD-10-CM | POA: Diagnosis not present

## 2024-03-18 DIAGNOSIS — M25611 Stiffness of right shoulder, not elsewhere classified: Secondary | ICD-10-CM | POA: Diagnosis not present

## 2024-03-18 DIAGNOSIS — I6523 Occlusion and stenosis of bilateral carotid arteries: Secondary | ICD-10-CM

## 2024-03-18 NOTE — Progress Notes (Signed)
 Carotid artery duplex 03/17/2024: Right ICA 40 to 59% stenosis.  Left ICA 1 to 39% stenosis. Bilateral vertebral arteries demonstrate antegrade flow. Normal flow dynamics in bilateral subclavian arteries. No significant change from study dated 03/14/2023.  Recheck in 1 year.  Moderate disease on the right.

## 2024-03-21 ENCOUNTER — Ambulatory Visit (HOSPITAL_COMMUNITY)
Admission: RE | Admit: 2024-03-21 | Discharge: 2024-03-21 | Disposition: A | Discharge status: Home / Self Care | Source: Ambulatory Visit | Attending: Vascular Surgery | Admitting: Vascular Surgery

## 2024-03-21 ENCOUNTER — Other Ambulatory Visit (HOSPITAL_COMMUNITY): Payer: Self-pay | Admitting: Physician Assistant

## 2024-03-21 DIAGNOSIS — M7989 Other specified soft tissue disorders: Secondary | ICD-10-CM | POA: Diagnosis not present

## 2024-03-21 DIAGNOSIS — M79661 Pain in right lower leg: Secondary | ICD-10-CM

## 2024-03-22 ENCOUNTER — Other Ambulatory Visit: Payer: Self-pay | Admitting: Adult Health

## 2024-03-23 ENCOUNTER — Other Ambulatory Visit: Payer: Self-pay | Admitting: Neurosurgery

## 2024-03-25 DIAGNOSIS — Z96611 Presence of right artificial shoulder joint: Secondary | ICD-10-CM | POA: Diagnosis not present

## 2024-03-25 DIAGNOSIS — R293 Abnormal posture: Secondary | ICD-10-CM | POA: Diagnosis not present

## 2024-03-25 DIAGNOSIS — M25511 Pain in right shoulder: Secondary | ICD-10-CM | POA: Diagnosis not present

## 2024-03-25 DIAGNOSIS — M25611 Stiffness of right shoulder, not elsewhere classified: Secondary | ICD-10-CM | POA: Diagnosis not present

## 2024-03-27 DIAGNOSIS — R293 Abnormal posture: Secondary | ICD-10-CM | POA: Diagnosis not present

## 2024-03-27 DIAGNOSIS — M25611 Stiffness of right shoulder, not elsewhere classified: Secondary | ICD-10-CM | POA: Diagnosis not present

## 2024-03-27 DIAGNOSIS — Z96611 Presence of right artificial shoulder joint: Secondary | ICD-10-CM | POA: Diagnosis not present

## 2024-03-27 DIAGNOSIS — M25511 Pain in right shoulder: Secondary | ICD-10-CM | POA: Diagnosis not present

## 2024-04-01 DIAGNOSIS — M25511 Pain in right shoulder: Secondary | ICD-10-CM | POA: Diagnosis not present

## 2024-04-01 DIAGNOSIS — R293 Abnormal posture: Secondary | ICD-10-CM | POA: Diagnosis not present

## 2024-04-01 DIAGNOSIS — Z96611 Presence of right artificial shoulder joint: Secondary | ICD-10-CM | POA: Diagnosis not present

## 2024-04-01 DIAGNOSIS — M25611 Stiffness of right shoulder, not elsewhere classified: Secondary | ICD-10-CM | POA: Diagnosis not present

## 2024-04-04 DIAGNOSIS — M25511 Pain in right shoulder: Secondary | ICD-10-CM | POA: Diagnosis not present

## 2024-04-04 DIAGNOSIS — M25611 Stiffness of right shoulder, not elsewhere classified: Secondary | ICD-10-CM | POA: Diagnosis not present

## 2024-04-04 DIAGNOSIS — R293 Abnormal posture: Secondary | ICD-10-CM | POA: Diagnosis not present

## 2024-04-04 DIAGNOSIS — Z96611 Presence of right artificial shoulder joint: Secondary | ICD-10-CM | POA: Diagnosis not present

## 2024-04-08 DIAGNOSIS — R293 Abnormal posture: Secondary | ICD-10-CM | POA: Diagnosis not present

## 2024-04-08 DIAGNOSIS — Z96611 Presence of right artificial shoulder joint: Secondary | ICD-10-CM | POA: Diagnosis not present

## 2024-04-08 DIAGNOSIS — M25511 Pain in right shoulder: Secondary | ICD-10-CM | POA: Diagnosis not present

## 2024-04-08 DIAGNOSIS — M25611 Stiffness of right shoulder, not elsewhere classified: Secondary | ICD-10-CM | POA: Diagnosis not present

## 2024-04-10 DIAGNOSIS — E559 Vitamin D deficiency, unspecified: Secondary | ICD-10-CM | POA: Diagnosis not present

## 2024-04-10 DIAGNOSIS — M25511 Pain in right shoulder: Secondary | ICD-10-CM | POA: Diagnosis not present

## 2024-04-10 DIAGNOSIS — M810 Age-related osteoporosis without current pathological fracture: Secondary | ICD-10-CM | POA: Diagnosis not present

## 2024-04-10 DIAGNOSIS — Z96611 Presence of right artificial shoulder joint: Secondary | ICD-10-CM | POA: Diagnosis not present

## 2024-04-10 DIAGNOSIS — M25611 Stiffness of right shoulder, not elsewhere classified: Secondary | ICD-10-CM | POA: Diagnosis not present

## 2024-04-10 DIAGNOSIS — R293 Abnormal posture: Secondary | ICD-10-CM | POA: Diagnosis not present

## 2024-04-14 DIAGNOSIS — S42201D Unspecified fracture of upper end of right humerus, subsequent encounter for fracture with routine healing: Secondary | ICD-10-CM | POA: Diagnosis not present

## 2024-04-15 DIAGNOSIS — R293 Abnormal posture: Secondary | ICD-10-CM | POA: Diagnosis not present

## 2024-04-15 DIAGNOSIS — M25611 Stiffness of right shoulder, not elsewhere classified: Secondary | ICD-10-CM | POA: Diagnosis not present

## 2024-04-15 DIAGNOSIS — M25511 Pain in right shoulder: Secondary | ICD-10-CM | POA: Diagnosis not present

## 2024-04-15 DIAGNOSIS — Z96611 Presence of right artificial shoulder joint: Secondary | ICD-10-CM | POA: Diagnosis not present

## 2024-04-22 DIAGNOSIS — M25511 Pain in right shoulder: Secondary | ICD-10-CM | POA: Diagnosis not present

## 2024-04-22 DIAGNOSIS — M25611 Stiffness of right shoulder, not elsewhere classified: Secondary | ICD-10-CM | POA: Diagnosis not present

## 2024-04-22 DIAGNOSIS — R293 Abnormal posture: Secondary | ICD-10-CM | POA: Diagnosis not present

## 2024-04-22 DIAGNOSIS — Z96611 Presence of right artificial shoulder joint: Secondary | ICD-10-CM | POA: Diagnosis not present

## 2024-04-24 DIAGNOSIS — R293 Abnormal posture: Secondary | ICD-10-CM | POA: Diagnosis not present

## 2024-04-24 DIAGNOSIS — M25611 Stiffness of right shoulder, not elsewhere classified: Secondary | ICD-10-CM | POA: Diagnosis not present

## 2024-04-24 DIAGNOSIS — M25511 Pain in right shoulder: Secondary | ICD-10-CM | POA: Diagnosis not present

## 2024-04-24 DIAGNOSIS — Z96611 Presence of right artificial shoulder joint: Secondary | ICD-10-CM | POA: Diagnosis not present

## 2024-04-29 DIAGNOSIS — M810 Age-related osteoporosis without current pathological fracture: Secondary | ICD-10-CM | POA: Diagnosis not present

## 2024-04-29 DIAGNOSIS — Z8262 Family history of osteoporosis: Secondary | ICD-10-CM | POA: Diagnosis not present

## 2024-04-29 DIAGNOSIS — M25611 Stiffness of right shoulder, not elsewhere classified: Secondary | ICD-10-CM | POA: Diagnosis not present

## 2024-04-29 DIAGNOSIS — E559 Vitamin D deficiency, unspecified: Secondary | ICD-10-CM | POA: Diagnosis not present

## 2024-04-29 DIAGNOSIS — Z8781 Personal history of (healed) traumatic fracture: Secondary | ICD-10-CM | POA: Diagnosis not present

## 2024-04-29 DIAGNOSIS — Z96611 Presence of right artificial shoulder joint: Secondary | ICD-10-CM | POA: Diagnosis not present

## 2024-04-29 DIAGNOSIS — R7989 Other specified abnormal findings of blood chemistry: Secondary | ICD-10-CM | POA: Diagnosis not present

## 2024-04-29 DIAGNOSIS — R293 Abnormal posture: Secondary | ICD-10-CM | POA: Diagnosis not present

## 2024-04-29 DIAGNOSIS — Z92241 Personal history of systemic steroid therapy: Secondary | ICD-10-CM | POA: Diagnosis not present

## 2024-04-29 DIAGNOSIS — Z87898 Personal history of other specified conditions: Secondary | ICD-10-CM | POA: Diagnosis not present

## 2024-04-29 DIAGNOSIS — M25511 Pain in right shoulder: Secondary | ICD-10-CM | POA: Diagnosis not present

## 2024-05-01 DIAGNOSIS — R293 Abnormal posture: Secondary | ICD-10-CM | POA: Diagnosis not present

## 2024-05-01 DIAGNOSIS — M25611 Stiffness of right shoulder, not elsewhere classified: Secondary | ICD-10-CM | POA: Diagnosis not present

## 2024-05-01 DIAGNOSIS — M25511 Pain in right shoulder: Secondary | ICD-10-CM | POA: Diagnosis not present

## 2024-05-01 DIAGNOSIS — Z96611 Presence of right artificial shoulder joint: Secondary | ICD-10-CM | POA: Diagnosis not present

## 2024-05-06 DIAGNOSIS — R293 Abnormal posture: Secondary | ICD-10-CM | POA: Diagnosis not present

## 2024-05-06 DIAGNOSIS — M25611 Stiffness of right shoulder, not elsewhere classified: Secondary | ICD-10-CM | POA: Diagnosis not present

## 2024-05-06 DIAGNOSIS — M25511 Pain in right shoulder: Secondary | ICD-10-CM | POA: Diagnosis not present

## 2024-05-06 DIAGNOSIS — Z96611 Presence of right artificial shoulder joint: Secondary | ICD-10-CM | POA: Diagnosis not present

## 2024-05-08 DIAGNOSIS — Z96611 Presence of right artificial shoulder joint: Secondary | ICD-10-CM | POA: Diagnosis not present

## 2024-05-08 DIAGNOSIS — M25511 Pain in right shoulder: Secondary | ICD-10-CM | POA: Diagnosis not present

## 2024-05-08 DIAGNOSIS — M25611 Stiffness of right shoulder, not elsewhere classified: Secondary | ICD-10-CM | POA: Diagnosis not present

## 2024-05-08 DIAGNOSIS — R293 Abnormal posture: Secondary | ICD-10-CM | POA: Diagnosis not present

## 2024-05-12 DIAGNOSIS — Z8781 Personal history of (healed) traumatic fracture: Secondary | ICD-10-CM | POA: Diagnosis not present

## 2024-05-12 DIAGNOSIS — M545 Low back pain, unspecified: Secondary | ICD-10-CM | POA: Diagnosis not present

## 2024-05-13 DIAGNOSIS — M25611 Stiffness of right shoulder, not elsewhere classified: Secondary | ICD-10-CM | POA: Diagnosis not present

## 2024-05-13 DIAGNOSIS — M25511 Pain in right shoulder: Secondary | ICD-10-CM | POA: Diagnosis not present

## 2024-05-13 DIAGNOSIS — R293 Abnormal posture: Secondary | ICD-10-CM | POA: Diagnosis not present

## 2024-05-13 DIAGNOSIS — Z96611 Presence of right artificial shoulder joint: Secondary | ICD-10-CM | POA: Diagnosis not present

## 2024-05-15 DIAGNOSIS — M25611 Stiffness of right shoulder, not elsewhere classified: Secondary | ICD-10-CM | POA: Diagnosis not present

## 2024-05-15 DIAGNOSIS — Z96611 Presence of right artificial shoulder joint: Secondary | ICD-10-CM | POA: Diagnosis not present

## 2024-05-15 DIAGNOSIS — R293 Abnormal posture: Secondary | ICD-10-CM | POA: Diagnosis not present

## 2024-05-15 DIAGNOSIS — M25511 Pain in right shoulder: Secondary | ICD-10-CM | POA: Diagnosis not present

## 2024-05-20 DIAGNOSIS — M6281 Muscle weakness (generalized): Secondary | ICD-10-CM | POA: Diagnosis not present

## 2024-05-20 DIAGNOSIS — E559 Vitamin D deficiency, unspecified: Secondary | ICD-10-CM | POA: Diagnosis not present

## 2024-05-20 DIAGNOSIS — M5459 Other low back pain: Secondary | ICD-10-CM | POA: Diagnosis not present

## 2024-05-20 DIAGNOSIS — R7989 Other specified abnormal findings of blood chemistry: Secondary | ICD-10-CM | POA: Diagnosis not present

## 2024-05-20 DIAGNOSIS — R2689 Other abnormalities of gait and mobility: Secondary | ICD-10-CM | POA: Diagnosis not present

## 2024-05-20 DIAGNOSIS — M810 Age-related osteoporosis without current pathological fracture: Secondary | ICD-10-CM | POA: Diagnosis not present

## 2024-05-22 ENCOUNTER — Ambulatory Visit: Admitting: Neurosurgery

## 2024-05-27 DIAGNOSIS — R2689 Other abnormalities of gait and mobility: Secondary | ICD-10-CM | POA: Diagnosis not present

## 2024-05-27 DIAGNOSIS — M5459 Other low back pain: Secondary | ICD-10-CM | POA: Diagnosis not present

## 2024-05-27 DIAGNOSIS — M6281 Muscle weakness (generalized): Secondary | ICD-10-CM | POA: Diagnosis not present

## 2024-05-28 DIAGNOSIS — S42201D Unspecified fracture of upper end of right humerus, subsequent encounter for fracture with routine healing: Secondary | ICD-10-CM | POA: Diagnosis not present

## 2024-05-29 DIAGNOSIS — M5459 Other low back pain: Secondary | ICD-10-CM | POA: Diagnosis not present

## 2024-05-29 DIAGNOSIS — R2689 Other abnormalities of gait and mobility: Secondary | ICD-10-CM | POA: Diagnosis not present

## 2024-05-29 DIAGNOSIS — M6281 Muscle weakness (generalized): Secondary | ICD-10-CM | POA: Diagnosis not present

## 2024-06-03 DIAGNOSIS — M5459 Other low back pain: Secondary | ICD-10-CM | POA: Diagnosis not present

## 2024-06-03 DIAGNOSIS — R748 Abnormal levels of other serum enzymes: Secondary | ICD-10-CM | POA: Diagnosis not present

## 2024-06-03 DIAGNOSIS — Z8781 Personal history of (healed) traumatic fracture: Secondary | ICD-10-CM | POA: Diagnosis not present

## 2024-06-03 DIAGNOSIS — M810 Age-related osteoporosis without current pathological fracture: Secondary | ICD-10-CM | POA: Diagnosis not present

## 2024-06-03 DIAGNOSIS — R2689 Other abnormalities of gait and mobility: Secondary | ICD-10-CM | POA: Diagnosis not present

## 2024-06-03 DIAGNOSIS — M6281 Muscle weakness (generalized): Secondary | ICD-10-CM | POA: Diagnosis not present

## 2024-06-03 DIAGNOSIS — E559 Vitamin D deficiency, unspecified: Secondary | ICD-10-CM | POA: Diagnosis not present

## 2024-06-03 DIAGNOSIS — Z92241 Personal history of systemic steroid therapy: Secondary | ICD-10-CM | POA: Diagnosis not present

## 2024-06-04 DIAGNOSIS — M810 Age-related osteoporosis without current pathological fracture: Secondary | ICD-10-CM | POA: Diagnosis not present

## 2024-06-04 DIAGNOSIS — M25551 Pain in right hip: Secondary | ICD-10-CM | POA: Diagnosis not present

## 2024-06-04 DIAGNOSIS — M545 Low back pain, unspecified: Secondary | ICD-10-CM | POA: Diagnosis not present

## 2024-06-05 DIAGNOSIS — M6281 Muscle weakness (generalized): Secondary | ICD-10-CM | POA: Diagnosis not present

## 2024-06-05 DIAGNOSIS — M5459 Other low back pain: Secondary | ICD-10-CM | POA: Diagnosis not present

## 2024-06-05 DIAGNOSIS — R2689 Other abnormalities of gait and mobility: Secondary | ICD-10-CM | POA: Diagnosis not present

## 2024-06-08 DIAGNOSIS — Z23 Encounter for immunization: Secondary | ICD-10-CM | POA: Diagnosis not present

## 2024-06-19 ENCOUNTER — Ambulatory Visit: Admitting: Neurosurgery

## 2024-07-10 DIAGNOSIS — M810 Age-related osteoporosis without current pathological fracture: Secondary | ICD-10-CM | POA: Diagnosis not present

## 2024-07-10 DIAGNOSIS — M8000XS Age-related osteoporosis with current pathological fracture, unspecified site, sequela: Secondary | ICD-10-CM | POA: Diagnosis not present

## 2024-07-24 DIAGNOSIS — I10 Essential (primary) hypertension: Secondary | ICD-10-CM | POA: Diagnosis not present

## 2024-07-24 DIAGNOSIS — R197 Diarrhea, unspecified: Secondary | ICD-10-CM | POA: Diagnosis not present

## 2024-07-24 DIAGNOSIS — D509 Iron deficiency anemia, unspecified: Secondary | ICD-10-CM | POA: Diagnosis not present

## 2024-07-24 DIAGNOSIS — Z1331 Encounter for screening for depression: Secondary | ICD-10-CM | POA: Diagnosis not present

## 2024-07-24 DIAGNOSIS — K219 Gastro-esophageal reflux disease without esophagitis: Secondary | ICD-10-CM | POA: Diagnosis not present

## 2024-07-24 DIAGNOSIS — G2581 Restless legs syndrome: Secondary | ICD-10-CM | POA: Diagnosis not present

## 2024-07-24 DIAGNOSIS — K589 Irritable bowel syndrome without diarrhea: Secondary | ICD-10-CM | POA: Diagnosis not present

## 2024-07-24 DIAGNOSIS — R109 Unspecified abdominal pain: Secondary | ICD-10-CM | POA: Diagnosis not present

## 2024-07-24 DIAGNOSIS — I25119 Atherosclerotic heart disease of native coronary artery with unspecified angina pectoris: Secondary | ICD-10-CM | POA: Diagnosis not present

## 2024-07-24 DIAGNOSIS — E559 Vitamin D deficiency, unspecified: Secondary | ICD-10-CM | POA: Diagnosis not present

## 2024-07-24 DIAGNOSIS — N3281 Overactive bladder: Secondary | ICD-10-CM | POA: Diagnosis not present

## 2024-07-24 DIAGNOSIS — G500 Trigeminal neuralgia: Secondary | ICD-10-CM | POA: Diagnosis not present

## 2024-07-24 DIAGNOSIS — J309 Allergic rhinitis, unspecified: Secondary | ICD-10-CM | POA: Diagnosis not present

## 2024-07-24 DIAGNOSIS — I779 Disorder of arteries and arterioles, unspecified: Secondary | ICD-10-CM | POA: Diagnosis not present

## 2024-07-24 DIAGNOSIS — Z0000 Encounter for general adult medical examination without abnormal findings: Secondary | ICD-10-CM | POA: Diagnosis not present

## 2024-07-30 DIAGNOSIS — R5383 Other fatigue: Secondary | ICD-10-CM | POA: Diagnosis not present

## 2024-07-30 DIAGNOSIS — E559 Vitamin D deficiency, unspecified: Secondary | ICD-10-CM | POA: Diagnosis not present

## 2024-07-30 DIAGNOSIS — M810 Age-related osteoporosis without current pathological fracture: Secondary | ICD-10-CM | POA: Diagnosis not present

## 2024-08-06 ENCOUNTER — Other Ambulatory Visit (HOSPITAL_COMMUNITY): Payer: Self-pay

## 2024-08-06 ENCOUNTER — Other Ambulatory Visit: Payer: Self-pay | Admitting: Cardiology

## 2024-08-06 ENCOUNTER — Ambulatory Visit
Admission: RE | Admit: 2024-08-06 | Discharge: 2024-08-06 | Disposition: A | Discharge status: Home / Self Care | Source: Ambulatory Visit

## 2024-08-06 VITALS — BP 134/81 | HR 73 | Temp 98.2°F | Resp 18

## 2024-08-06 DIAGNOSIS — Z79899 Other long term (current) drug therapy: Secondary | ICD-10-CM | POA: Diagnosis not present

## 2024-08-06 DIAGNOSIS — N3941 Urge incontinence: Secondary | ICD-10-CM | POA: Insufficient documentation

## 2024-08-06 DIAGNOSIS — Z87440 Personal history of urinary (tract) infections: Secondary | ICD-10-CM | POA: Diagnosis not present

## 2024-08-06 DIAGNOSIS — R350 Frequency of micturition: Secondary | ICD-10-CM | POA: Insufficient documentation

## 2024-08-06 DIAGNOSIS — R300 Dysuria: Secondary | ICD-10-CM | POA: Insufficient documentation

## 2024-08-06 DIAGNOSIS — G250 Essential tremor: Secondary | ICD-10-CM

## 2024-08-06 LAB — POCT URINE DIPSTICK
Glucose, UA: 100 mg/dL — AB
Nitrite, UA: POSITIVE — AB
POC PROTEIN,UA: >=300 — AB
Spec Grav, UA: 1.015 (ref 1.010–1.025)
Urobilinogen, UA: >=8 U/dL — AB
pH, UA: 5 (ref 5.0–8.0)

## 2024-08-06 MED ORDER — PROPRANOLOL HCL 20 MG PO TABS
20.0000 mg | ORAL_TABLET | Freq: Three times a day (TID) | ORAL | 2 refills | Status: DC
  Filled 2024-08-06: qty 270, 90d supply, fill #0

## 2024-08-06 MED ORDER — CEFTRIAXONE SODIUM 1 G IJ SOLR
1.0000 g | Freq: Once | INTRAMUSCULAR | Status: AC
Start: 2024-08-06 — End: 2024-08-06
  Administered 2024-08-06: 1 g via INTRAMUSCULAR

## 2024-08-06 NOTE — Discharge Instructions (Signed)
 Your urinalysis shows Amanda Lambert blood cells and nitrates which are indicative of infection, your urine will be sent to the lab to determine exactly which bacteria is present, if any changes need to be made to your medications you will be notified  You have been given an injection of Rocephin  here today in the clinic  You may use over-the-counter Azo to help minimize your symptoms until antibiotic removes bacteria, this medication will turn your urine orange  Increase your fluid intake through use of water  As always practice good hygiene, wiping front to back and avoidance of scented vaginal products to prevent further irritation  If symptoms continue to persist after use of medication or recur please follow-up with urgent care or your primary doctor as needed

## 2024-08-06 NOTE — ED Triage Notes (Signed)
 Patient reports urinary frequency, and  painful urination that started yesterday. Patient has used AZO with some relief.

## 2024-08-06 NOTE — ED Provider Notes (Signed)
 CAY RALPH PELT    CSN: [id number] Arrival date & time: 08/06/24  1416      History   Chief Complaint Chief Complaint  Patient presents with   Urinary Frequency    Painful, urgent urination. - Entered by patient   Dysuria    HPI Amanda Lambert is a 78 y.o. female.   Patient presents for evaluation of urinary frequency and dysuria beginning 1 day ago.  Has attempted use of Azo which has been helpful.  Denies hematuria, abdominal or flank pain, fever or vaginal symptoms.  History of reoccurring UTI  Past Medical History:  Diagnosis Date   Anemia    Angina pectoris    Aortic atherosclerosis    Arthritis    Bilateral carotid artery disease 10/15/2019   a.) carotid doppler 10/15/2019: 50-69% BICA; b.) carotid doppler 04/06/2020: 50-69% RICA, 1-15% LICA; c.) carotid doppler 11/03/2020 and 04/29/2021: 50-69% RICA, 16-49% LICA; d.) carotid doppler 03/02/2022: 50-69% BICA; e.) carotid doppler 03/14/2023: 50-69% RICA, 16-49% LICA   Biliary dyskinesia    CAD (coronary artery disease) 06/14/2021   a.) LHC 11/04/2019: 50% mLAD - med mgmt; b.) LHC/PCI 06/14/2021: 40% p-mLAD, 80% mLAD (3.0 x 30 mm Onyx Frontier DES; c.) LHC for FFR study: 07/19/2021: 40% pLAD; RFR 0.92, FFR 0.89, CFR 4.8, IMR 18 --> no significant macro/microvascular disease   CKD (chronic kidney disease), stage III (HCC)    Complication of anesthesia    a.) PONV; b.) delayed emergence   Constipation    Diastolic dysfunction 10/15/2019   a.) TTE 10/15/2019: EF 50-55%, norm LAP, mild LA dil, mod MR, mild-mod TR, G1DD; b.) TTE 05/19/2021: EF 50-55%, mild basal sep hypertrophy, norm LAP, triv MR/TR, G1DD   GERD (gastroesophageal reflux disease)    Hepatitis B 1975   Hypercholesteremia    Hypertension    Inguinal hernia    Irritable bowel syndrome without diarrhea    LBBB (left bundle branch block)    Leukopenia    Long term current use of aspirin     PONV (postoperative nausea and vomiting)    [facility]  spotted fever 04/01/2012   adm. to hospital   Syncope 10/17/2019   Thrombocytopenia    Transaminitis    Trochanteric bursitis, right hip    Vaginal Pap smear, abnormal    Varicose veins of leg with edema, bilateral    Weakness     Patient Active Problem List   Diagnosis Date Noted   Compression fracture of L2 lumbar vertebra, open, initial encounter 08/31/2023   Compression fracture of T12 vertebra (HCC) 08/31/2023   Lumbar compression fracture (HCC) 08/30/2023   CHF (congestive heart failure) (HCC) 04/17/2023   Coronary artery disease of native artery of native heart with stable angina pectoris 07/04/2021   Hypercholesteremia 07/04/2021   LAD stenosis 06/13/2021   Angina pectoris 11/03/2019   Biliary dyskinesia 04/09/2012   Transaminitis 04/01/2012   Recurrent UTI 04/01/2012   HTN (hypertension) 04/01/2012   GERD (gastroesophageal reflux disease) 04/01/2012    Past Surgical History:  Procedure Laterality Date   APPENDECTOMY  09/25/1961   CHOLECYSTECTOMY  04/26/2012   Procedure: LAPAROSCOPIC CHOLECYSTECTOMY WITH INTRAOPERATIVE CHOLANGIOGRAM;  Surgeon: Deward GORMAN Curvin DOUGLAS, MD;  Location: [facility] ORS;  Service: General;  Laterality: N/A;   COLONOSCOPY     CORONARY ANGIOGRAPHY N/A 07/19/2021   Procedure: CORONARY ANGIOGRAPHY (CATH LAB);  Surgeon: Ladona Heinz, MD;  Location[facility] INVASIVE CV LAB;  Service: Cardiovascular;  Laterality: N/A;   CORONARY IMAGING/OCT N/A 06/14/2021  Procedure: INTRAVASCULAR IMAGING/OCT;  Surgeon: Ladona Heinz, MD;  Location: [facility] INVASIVE CV LAB;  Service: Cardiovascular;  Laterality: N/A;   CORONARY PRESSURE/FFR STUDY N/A 07/19/2021   Procedure: INTRAVASCULAR PRESSURE WIRE/FFR STUDY;  Surgeon: Ladona Heinz, MD;  Location: [facility] INVASIVE CV LAB;  Service: Cardiovascular;  Laterality: N/A;   CORONARY STENT INTERVENTION N/A 06/14/2021   Procedure: CORONARY STENT INTERVENTION;  Surgeon: Ladona Heinz, MD;  Location: [facility] INVASIVE CV LAB;  Service: Cardiovascular;  Laterality:  N/A;   DILATION AND CURETTAGE OF UTERUS  09/25/1980   for miscarriage   DILATION AND CURETTAGE, DIAGNOSTIC / THERAPEUTIC  09/25/1970   INSERTION OF MESH Right 05/03/2023   Procedure: INSERTION OF MESH;  Surgeon: Tye Millet, DO;  Location: [facility] ORS;  Service: General;  Laterality: Right;   IR KYPHO EA ADDL LEVEL THORACIC OR LUMBAR  09/05/2023   IR KYPHO LUMBAR INC FX REDUCE BONE BX UNI/BIL CANNULATION INC/IMAGING  09/05/2023   IR KYPHO THORACIC WITH BONE BIOPSY  09/05/2023   LEFT HEART CATH AND CORONARY ANGIOGRAPHY N/A 11/04/2019   Procedure: LEFT HEART CATH AND CORONARY ANGIOGRAPHY;  Surgeon: Ladona Heinz, MD;  Location: [facility] INVASIVE CV LAB;  Service: Cardiovascular;  Laterality: N/A;   LEFT HEART CATH AND CORONARY ANGIOGRAPHY N/A 06/14/2021   Procedure: LEFT HEART CATH AND CORONARY ANGIOGRAPHY;  Surgeon: Ladona Heinz, MD;  Location: [facility] INVASIVE CV LAB;  Service: Cardiovascular;  Laterality: N/A;   TONSILLECTOMY  1964  - approximate   TOTAL SHOULDER ARTHROPLASTY Right 01/28/2024   WRIST FRACTURE SURGERY  09/25/2006   right    OB History     Gravida  2   Para      Term      Preterm      AB  1   Living  1      SAB  1   IAB      Ectopic      Multiple      Live Births  1            Home Medications    Prior to Admission medications   Medication Sig Start Date End Date Taking? Authorizing Provider  MYRBETRIQ 25 MG TB24 tablet Take 25 mg by mouth daily. 07/25/24  Yes [provider]  aspirin  EC 81 MG tablet Take 81 mg by mouth daily. Swallow whole.    [provider]  calcitonin, salmon, (MIACALCIN /FORTICAL) 200 UNIT/ACT nasal spray Place 1 spray into alternate nostrils daily. 09/03/23   Barbarann Nest, MD  cyclobenzaprine  (FLEXERIL ) 10 MG tablet TAKE 1 TABLET BY MOUTH THREE TIMES A DAY AS NEEDED FOR MUSCLE SPASMS 03/23/24   Gregory Edsel Ruth, PA  EPINEPHrine  0.3 mg/0.3 mL IJ SOAJ injection Inject 0.3 mg into the muscle once as needed for  anaphylaxis.    [provider]  esomeprazole (NEXIUM) 40 MG capsule Take 40 mg by mouth in the morning.    [provider]  estradiol  (ESTRACE ) 0.1 MG/GM vaginal cream Place 1 g vaginally 3 (three) times a week. Patient not taking: Reported on 03/06/2024 07/02/23   Cleatus Moccasin, MD  furosemide  (LASIX ) 20 MG tablet Take 1 tablet (20 mg total) by mouth daily as needed. 10/30/23   Ladona Heinz, MD  gabapentin  (NEURONTIN ) 300 MG capsule Take 300 mg by mouth 2 (two) times daily. 02/14/21   [provider]  isosorbide  mononitrate (IMDUR ) 120 MG 24 hr tablet TAKE 1 TABLET BY MOUTH EVERY DAY 03/25/24   Jerilynn Lamarr HERO, NP  lidocaine  (LIDODERM )  5 % Place 1 patch onto the skin daily. Remove & Discard patch within 12 hours or as directed by MD 09/02/23   Barbarann Nest, MD  magnesium 30 MG tablet Take 30 mg by mouth 2 (two) times daily.    [provider]  nitroGLYCERIN  (NITROSTAT ) 0.4 MG SL tablet Place 1 tablet (0.4 mg total) under the tongue every 5 (five) minutes as needed for up to 25 days for chest pain. 08/03/23 03/06/24  Jerilynn Lamarr HERO, NP  ondansetron  (ZOFRAN -ODT) 4 MG disintegrating tablet Take 1 tablet (4 mg total) by mouth every 8 (eight) hours as needed for nausea or vomiting. 01/29/24   Veta Palma, PA-C  potassium chloride  SA (KLOR-CON  M20) 20 MEQ tablet TAKE 1 TABLET BY MOUTH EVERY DAY Patient not taking: Reported on 03/06/2024 10/30/23   Ladona Heinz, MD  Probiotic Product (ALIGN PO) Take by mouth.    [provider]  propranolol  (INDERAL ) 20 MG tablet Take 1 tablet (20 mg total) by mouth 3 (three) times daily. 08/06/24   Ladona Heinz, MD  rosuvastatin  (CRESTOR ) 10 MG tablet TAKE 1 TABLET BY MOUTH EVERY DAY 06/29/23   Ladona Heinz, MD  traMADol (ULTRAM) 50 MG tablet Take 50 mg by mouth every 6 (six) hours as needed. 02/13/24   [provider]  TYMLOS 3120 MCG/1.56ML SOPN as directed Subcutaneous 80mcg daily    [provider]  VITAMIN D   PO Take 1 capsule by mouth every other day. In the morning    [provider]    Family History Family History  Problem Relation Age of Onset   Cardiomyopathy Mother    Coronary artery disease Father    Hypertension Brother    Prostate cancer Brother     Social History Social History   Tobacco Use   Smoking status: Never   Smokeless tobacco: Never  Vaping Use   Vaping status: Never Used  Substance Use Topics   Alcohol use: No   Drug use: No     Allergies   Ace inhibitors, Bee venom, Thorazine [chlorpromazine], Wasp venom, Levaquin  [levofloxacin  in d5w], Shrimp [shellfish allergy], Amlodipine , Atropine sulfate, Elemental sulfur, Glycopyrrolate , Ramipril, Sulfa antibiotics, Compazine [prochlorperazine edisylate], and Latex   Review of Systems Review of Systems  Genitourinary:  Positive for dysuria and frequency. Negative for decreased urine volume, difficulty urinating, dyspareunia, enuresis, flank pain, genital sores, hematuria, menstrual problem, pelvic pain, urgency, vaginal bleeding, vaginal discharge and vaginal pain.     Physical Exam Triage Vital Signs ED Triage Vitals  Encounter Vitals Group     BP 08/06/24 1431 134/81     Girls Systolic BP Percentile --      Girls Diastolic BP Percentile --      Boys Systolic BP Percentile --      Boys Diastolic BP Percentile --      Pulse Rate 08/06/24 1431 73     Resp 08/06/24 1431 18     Temp 08/06/24 1431 98.2 F (36.8 C)     Temp Source 08/06/24 1431 Oral     SpO2 08/06/24 1431 97 %     Weight --      Height --      Head Circumference --      Peak Flow --      Pain Score 08/06/24 1429 0     Pain Loc --      Pain Education --      Exclude from Growth Chart --    No data found.  Updated Vital Signs BP 134/81 (BP Location: Right Arm)   Pulse 73   Temp 98.2 F (36.8 C) (Oral)   Resp 18   SpO2 97%   Visual Acuity Right Eye Distance:   Left Eye Distance:   Bilateral Distance:    Right Eye  Near:   Left Eye Near:    Bilateral Near:     Physical Exam Constitutional:      Appearance: Normal appearance.  Eyes:     Extraocular Movements: Extraocular movements intact.  Pulmonary:     Effort: Pulmonary effort is normal.  Abdominal:     Tenderness: There is no abdominal tenderness. There is no right CVA tenderness, left CVA tenderness or guarding.  Neurological:     Mental Status: She is alert and oriented to person, place, and time. Mental status is at baseline.      UC Treatments / Results  Labs (all labs ordered are listed, but only abnormal results are displayed) Labs Reviewed  URINE CULTURE  POCT URINE DIPSTICK    EKG   Radiology No results found.  Procedures Procedures (including critical care time)  Medications Ordered in UC Medications - No data to display  Initial Impression / Assessment and Plan / UC Course  I have reviewed the triage vital signs and the nursing notes.  Pertinent labs & imaging results that were available during my care of the patient were reviewed by me and considered in my medical decision making (see chart for details).  Acute cystitis without hematuria  Urinalysis showing leukocytes and nitrates, sent for culture, prior culture showing E. coli, prior has been given a dose of Rocephin  for treatment and had success with this would like to move forward with this treatment today, Rocephin  IM given will initiate additional antibiotics based on urine culture, recommend over-the-counter medication and nonpharmacological supportive care with follow-up as needed Final Clinical Impressions(s) / UC Diagnoses   Final diagnoses:  Urinary frequency  Dysuria   Discharge Instructions   None    ED Prescriptions   None    PDMP not reviewed this encounter.   Teresa Shelba SAUNDERS, NP 08/06/24 [phone]

## 2024-08-07 DIAGNOSIS — M8000XG Age-related osteoporosis with current pathological fracture, unspecified site, subsequent encounter for fracture with delayed healing: Secondary | ICD-10-CM | POA: Diagnosis not present

## 2024-08-07 DIAGNOSIS — E559 Vitamin D deficiency, unspecified: Secondary | ICD-10-CM | POA: Diagnosis not present

## 2024-08-08 ENCOUNTER — Ambulatory Visit (HOSPITAL_COMMUNITY): Payer: Self-pay

## 2024-08-08 LAB — URINE CULTURE: Culture: >=100000 — AB

## 2024-08-08 MED ORDER — CEFDINIR 300 MG PO CAPS: 300.0000 mg | ORAL_CAPSULE | Freq: Two times a day (BID) | ORAL | 0 refills | Status: AC

## 2024-08-18 ENCOUNTER — Other Ambulatory Visit (HOSPITAL_COMMUNITY): Payer: Self-pay

## 2024-09-01 DIAGNOSIS — Z96611 Presence of right artificial shoulder joint: Secondary | ICD-10-CM | POA: Diagnosis not present

## 2024-09-01 DIAGNOSIS — Z471 Aftercare following joint replacement surgery: Secondary | ICD-10-CM | POA: Diagnosis not present

## 2024-09-01 DIAGNOSIS — S22000D Wedge compression fracture of unspecified thoracic vertebra, subsequent encounter for fracture with routine healing: Secondary | ICD-10-CM | POA: Diagnosis not present

## 2024-09-02 ENCOUNTER — Other Ambulatory Visit: Payer: Self-pay | Admitting: Cardiology

## 2024-09-02 DIAGNOSIS — I6523 Occlusion and stenosis of bilateral carotid arteries: Secondary | ICD-10-CM

## 2024-09-02 DIAGNOSIS — I209 Angina pectoris, unspecified: Secondary | ICD-10-CM

## 2024-09-22 ENCOUNTER — Other Ambulatory Visit: Payer: Self-pay | Admitting: Cardiology

## 2024-09-22 DIAGNOSIS — G250 Essential tremor: Secondary | ICD-10-CM

## 2024-09-24 MED ORDER — PROPRANOLOL HCL 20 MG PO TABS: 20.0000 mg | ORAL_TABLET | Freq: Three times a day (TID) | ORAL | 1 refills | Status: DC

## 2024-10-02 ENCOUNTER — Encounter: Payer: Self-pay | Admitting: Emergency Medicine

## 2024-10-02 ENCOUNTER — Emergency Department
Admission: EM | Admit: 2024-10-02 | Discharge: 2024-10-02 | Disposition: A | Discharge status: Home / Self Care | Attending: Emergency Medicine | Admitting: Emergency Medicine

## 2024-10-02 ENCOUNTER — Emergency Department

## 2024-10-02 ENCOUNTER — Other Ambulatory Visit: Payer: Self-pay

## 2024-10-02 ENCOUNTER — Telehealth: Payer: Self-pay | Admitting: Cardiology

## 2024-10-02 DIAGNOSIS — I2510 Atherosclerotic heart disease of native coronary artery without angina pectoris: Secondary | ICD-10-CM | POA: Diagnosis not present

## 2024-10-02 DIAGNOSIS — Z955 Presence of coronary angioplasty implant and graft: Secondary | ICD-10-CM | POA: Diagnosis not present

## 2024-10-02 DIAGNOSIS — N189 Chronic kidney disease, unspecified: Secondary | ICD-10-CM | POA: Diagnosis not present

## 2024-10-02 DIAGNOSIS — I129 Hypertensive chronic kidney disease with stage 1 through stage 4 chronic kidney disease, or unspecified chronic kidney disease: Secondary | ICD-10-CM | POA: Insufficient documentation

## 2024-10-02 DIAGNOSIS — R079 Chest pain, unspecified: Secondary | ICD-10-CM | POA: Insufficient documentation

## 2024-10-02 LAB — BASIC METABOLIC PANEL WITH GFR
Anion gap: 10 (ref 5–15)
BUN: 13 mg/dL (ref 8–23)
CO2: 29 mmol/L (ref 22–32)
Calcium: 9.6 mg/dL (ref 8.9–10.3)
Chloride: 103 mmol/L (ref 98–111)
Creatinine, Ser: 0.84 mg/dL (ref 0.44–1.00)
GFR, Estimated: >60 mL/min
Glucose, Bld: 94 mg/dL (ref 70–99)
Potassium: 3.9 mmol/L (ref 3.5–5.1)
Sodium: 141 mmol/L (ref 135–145)

## 2024-10-02 LAB — HEPATIC FUNCTION PANEL
ALT: 9 U/L (ref 0–44)
AST: 20 U/L (ref 15–41)
Albumin: 4.2 g/dL (ref 3.5–5.0)
Alkaline Phosphatase: 102 U/L (ref 38–126)
Bilirubin, Direct: 0.2 mg/dL (ref 0.0–0.2)
Indirect Bilirubin: 0.4 mg/dL (ref 0.3–0.9)
Total Bilirubin: 0.6 mg/dL (ref 0.0–1.2)
Total Protein: 7.1 g/dL (ref 6.5–8.1)

## 2024-10-02 LAB — CBC
HCT: 38.1 % (ref 36.0–46.0)
Hemoglobin: 12.1 g/dL (ref 12.0–15.0)
MCH: 29.1 pg (ref 26.0–34.0)
MCHC: 31.8 g/dL (ref 30.0–36.0)
MCV: 91.6 fL (ref 80.0–100.0)
Platelets: 307 K/uL (ref 150–400)
RBC: 4.16 MIL/uL (ref 3.87–5.11)
RDW: 12.4 % (ref 11.5–15.5)
WBC: 6 K/uL (ref 4.0–10.5)
nRBC: 0 % (ref 0.0–0.2)

## 2024-10-02 LAB — TROPONIN T, HIGH SENSITIVITY
Troponin T High Sensitivity: 24 ng/L — ABNORMAL HIGH (ref 0–19)
Troponin T High Sensitivity: 24 ng/L — ABNORMAL HIGH (ref 0–19)

## 2024-10-02 NOTE — Telephone Encounter (Signed)
 Spoke with pt. Pt described pain as intense squeezing pain she could not get under control with nitroglycerin . Pt did take 4 nitroglycerin  and did not call 911. Pt also had nausea during this episode and feels very weak today. Advised pt to go to the ED. Pt did not want to go at first but did finally state she would call her brother and go to ED.

## 2024-10-02 NOTE — Discharge Instructions (Signed)
 You were seen in the emergency department for chest pain.  Your heart enzymes were 24 but remained stable at 24.  Your EKG did not have any significant change from your prior.  You remained without chest discomfort in the emergency department.  Your blood pressure was significantly elevated in the emergency department.  We discussed admission for further cardiac workup however you wanted to go home and follow-up as an outpatient with your cardiologist.  Call them today.  Make sure you are taking your hypertensive medications as prescribed.  Return to the emergency department if you have any return of symptoms or new worsening symptoms.

## 2024-10-02 NOTE — Telephone Encounter (Signed)
" ° °  Pt c/o of Chest Pain: STAT if active CP, including tightness, pressure, jaw pain, radiating pain to shoulder/upper arm/back, CP unrelieved by Nitro. Symptoms reported of SOB, nausea, vomiting, sweating.  1. Are you having CP right now? No    2. Are you experiencing any other symptoms (ex. SOB, nausea, vomiting, sweating)? No    3. Is your CP continuous or coming and going? Coming and going    4. Have you taken Nitroglycerin ? Yes, 4    5. How long have you been experiencing CP? Yesterday evening      6. If NO CP at time of call then end call with telling Pt to call back or call 911 if Chest pain returns prior to return call from triage team.  Pt had an episode of chest pain last night. She described it as an intense squeezing. Pt also had nausea.No chest pain today, but pt is feeling very weak. Please advise.  "

## 2024-10-02 NOTE — ED Triage Notes (Signed)
 Pt to ED via POV. Pt states that she has been having chest pain that started last night. Pt states that she took 4 nitroglycerin  and was able to get comfortable enough where she could rest. Pt states that is not having chest pain now but she does feel weak and has had some shortness of breath this morning. Pt is in NAD at this time.

## 2024-10-02 NOTE — ED Provider Notes (Signed)
 "  [facility] Provider Note    Event Date/Time   First MD Initiated Contact with Patient 10/02/24 1216     (approximate)   History   Chest Pain   HPI  Amanda Lambert is a 79 y.o. female past medical history significant for left bundle branch block, hypertension, CKD, bilateral carotid stenosis, CAD with prior LAD stent in 2022, repeat cardiac catheterization in 2022 that revealed a patent LAD stent and microvascular disease, moderate TR, who presents to the emergency department with chest pain.  Patient states that she had chest discomfort last night while she was sitting at rest.  Took 1 nitroglycerin  but did not resolve her symptoms.  Took another 4 nitroglycerin .  Called her cardiologist today and was told that she should come into the emergency department for further evaluation.  Patient states that she has not had any chest discomfort since last night.  States that she is taking her medications as prescribed.   On chart review patient is followed by cardiology Dr. Ladona, has a past medical history of coronary artery disease with left bundle branch block, last echocardiogram showed normal heart function, last cardiac catheterization was in 2022 that revealed a patent LAD stent and had microvascular disease.      Physical Exam   Triage Vital Signs: ED Triage Vitals  Encounter Vitals Group     BP 10/02/24 0952 (!) 171/95     Girls Systolic BP Percentile --      Girls Diastolic BP Percentile --      Boys Systolic BP Percentile --      Boys Diastolic BP Percentile --      Pulse Rate 10/02/24 0952 73     Resp 10/02/24 0952 16     Temp 10/02/24 0952 98.2 F (36.8 C)     Temp Source 10/02/24 0952 Oral     SpO2 10/02/24 0952 97 %     Weight 10/02/24 0953 180 lb (81.6 kg)     Height 10/02/24 0953 5' 5 (1.651 m)     Head Circumference --      Peak Flow --      Pain Score 10/02/24 0952 3     Pain Loc --      Pain Education --      Exclude from Growth  Chart --     Most recent vital signs: Vitals:   10/02/24 1229 10/02/24 1314  BP: (!) 185/102 (!) 173/89  Pulse:    Resp:    Temp:    SpO2:      Physical Exam Constitutional:      Appearance: She is well-developed.  HENT:     Head: Atraumatic.  Eyes:     Conjunctiva/sclera: Conjunctivae normal.  Cardiovascular:     Rate and Rhythm: Regular rhythm.     Heart sounds: Normal heart sounds.  Pulmonary:     Effort: No respiratory distress.  Abdominal:     General: There is no distension.     Palpations: Abdomen is soft.     Tenderness: There is no abdominal tenderness.  Musculoskeletal:        General: Normal range of motion.     Cervical back: Normal range of motion.     Right lower leg: No edema.     Left lower leg: No edema.  Skin:    General: Skin is warm.     Capillary Refill: Capillary refill takes less than 2 seconds.  Neurological:  General: No focal deficit present.     Mental Status: She is alert. Mental status is at baseline.     IMPRESSION / MDM / ASSESSMENT AND PLAN / ED COURSE  I reviewed the triage vital signs and the nursing notes.  Differential diagnosis including ACS, Prinzmetal, gastritis/GERD, anemia, pneumonia, pneumothorax, pulmonary edema, heart failure exacerbation, pulmonary embolism   EKG  I, Clotilda Punter, the attending physician, personally viewed and interpreted this ECG.  Left bundle branch block, negative Sgarbossa's criteria, 1 PVC.  No significant change when compared to prior EKG.  QTc 462.  QRS 140.  PR 170.  RADIOLOGY I independently reviewed imaging, my interpretation of imaging: Chest x-ray with no signs of pneumonia, no widened mediastinum and no pneumothorax.  No signs of pulmonary edema.  LABS (all labs ordered are listed, but only abnormal results are displayed) Labs interpreted as -    Labs Reviewed  TROPONIN T, HIGH SENSITIVITY - Abnormal; Notable for the following components:      Result Value   Troponin T High  Sensitivity 24 (*)    All other components within normal limits  TROPONIN T, HIGH SENSITIVITY - Abnormal; Notable for the following components:   Troponin T High Sensitivity 24 (*)    All other components within normal limits  BASIC METABOLIC PANEL WITH GFR  CBC  HEPATIC FUNCTION PANEL     MDM  Patient remained chest pain-free while in the emergency department hypertensive but did not take antihypertensive medication this morning.  Does state that she has had recent changes to her antihypertensive medications with her Imdur .  Change from 120 to 20 mg 3 times daily.  States that she did run out of her propranolol  for a couple of days but has restarted.  No ongoing chest pain at this time.  Initial troponin was 24 and repeat troponin remained to be 24.  Patient is high risk for heart score, discussed admission for further evaluation with cardiology and possible stress testing or further cardiac catheterization.  Patient expressed understanding but states that she would rather call their cardiologist today and do an outpatient workup and states that she could get in rapidly and remains chest pain-free at this time.  Feel that this would be reasonable given that the patient is chest pain-free and has 2 troponins that are reassuring at 24 with no EKG changes.  I have a low suspicion for dissection, no tearing chest pain, pulses are equal and symmetric.  Low suspicion for pulmonary embolism, no shortness of breath, no tachycardia not on any estrogen replacement, low risk Wells criteria.  Do not feel that CTA is necessary at this time.  Abdomen is nontender to palpation.  Normal LFTs.  No significant electrolyte abnormality.  Creatinine at baseline.  On reevaluation patient states that she is feeling fine and she wants to go home.  States that she will call her cardiologist today for close follow-up and return to the emergency department if she has any recurrent symptoms.      PROCEDURES:  Critical Care performed: No  Procedures  Patient's presentation is most consistent with acute presentation with potential threat to life or bodily function.   MEDICATIONS ORDERED IN ED: Medications - No data to display  FINAL CLINICAL IMPRESSION(S) / ED DIAGNOSES   Final diagnoses:  Chest pain, unspecified type     Rx / DC Orders   ED Discharge Orders     None        Note:  This document  was prepared using Conservation officer, historic buildings and may include unintentional dictation errors.   Suzanne Kirsch, MD 10/02/24 1327  "

## 2024-10-05 ENCOUNTER — Emergency Department

## 2024-10-05 ENCOUNTER — Inpatient Hospital Stay
Admission: EM | Admit: 2024-10-05 | Discharge: 2024-10-08 | DRG: 251 | Disposition: A | Discharge status: Home / Self Care | Attending: Internal Medicine | Admitting: Internal Medicine

## 2024-10-05 ENCOUNTER — Other Ambulatory Visit: Payer: Self-pay

## 2024-10-05 DIAGNOSIS — D649 Anemia, unspecified: Secondary | ICD-10-CM | POA: Diagnosis not present

## 2024-10-05 DIAGNOSIS — I5032 Chronic diastolic (congestive) heart failure: Secondary | ICD-10-CM | POA: Diagnosis present

## 2024-10-05 DIAGNOSIS — E7800 Pure hypercholesterolemia, unspecified: Secondary | ICD-10-CM | POA: Diagnosis present

## 2024-10-05 DIAGNOSIS — T502X5A Adverse effect of carbonic-anhydrase inhibitors, benzothiadiazides and other diuretics, initial encounter: Secondary | ICD-10-CM | POA: Diagnosis present

## 2024-10-05 DIAGNOSIS — Y831 Surgical operation with implant of artificial internal device as the cause of abnormal reaction of the patient, or of later complication, without mention of misadventure at the time of the procedure: Secondary | ICD-10-CM | POA: Diagnosis present

## 2024-10-05 DIAGNOSIS — Z66 Do not resuscitate: Secondary | ICD-10-CM | POA: Diagnosis present

## 2024-10-05 DIAGNOSIS — T82855A Stenosis of coronary artery stent, initial encounter: Principal | ICD-10-CM | POA: Diagnosis present

## 2024-10-05 DIAGNOSIS — E663 Overweight: Secondary | ICD-10-CM | POA: Diagnosis not present

## 2024-10-05 DIAGNOSIS — Z6829 Body mass index (BMI) 29.0-29.9, adult: Secondary | ICD-10-CM

## 2024-10-05 DIAGNOSIS — R079 Chest pain, unspecified: Secondary | ICD-10-CM | POA: Diagnosis not present

## 2024-10-05 DIAGNOSIS — N1831 Chronic kidney disease, stage 3a: Secondary | ICD-10-CM | POA: Diagnosis not present

## 2024-10-05 DIAGNOSIS — Z91040 Latex allergy status: Secondary | ICD-10-CM

## 2024-10-05 DIAGNOSIS — I200 Unstable angina: Secondary | ICD-10-CM | POA: Diagnosis present

## 2024-10-05 DIAGNOSIS — I10 Essential (primary) hypertension: Secondary | ICD-10-CM | POA: Diagnosis not present

## 2024-10-05 DIAGNOSIS — I2510 Atherosclerotic heart disease of native coronary artery without angina pectoris: Secondary | ICD-10-CM | POA: Diagnosis present

## 2024-10-05 DIAGNOSIS — I493 Ventricular premature depolarization: Secondary | ICD-10-CM | POA: Diagnosis present

## 2024-10-05 DIAGNOSIS — Z96611 Presence of right artificial shoulder joint: Secondary | ICD-10-CM | POA: Diagnosis present

## 2024-10-05 DIAGNOSIS — I130 Hypertensive heart and chronic kidney disease with heart failure and stage 1 through stage 4 chronic kidney disease, or unspecified chronic kidney disease: Secondary | ICD-10-CM | POA: Diagnosis present

## 2024-10-05 DIAGNOSIS — G250 Essential tremor: Secondary | ICD-10-CM

## 2024-10-05 DIAGNOSIS — I6523 Occlusion and stenosis of bilateral carotid arteries: Secondary | ICD-10-CM

## 2024-10-05 DIAGNOSIS — I447 Left bundle-branch block, unspecified: Secondary | ICD-10-CM | POA: Diagnosis present

## 2024-10-05 DIAGNOSIS — E876 Hypokalemia: Secondary | ICD-10-CM | POA: Diagnosis present

## 2024-10-05 DIAGNOSIS — Z91013 Allergy to seafood: Secondary | ICD-10-CM

## 2024-10-05 DIAGNOSIS — Z888 Allergy status to other drugs, medicaments and biological substances status: Secondary | ICD-10-CM

## 2024-10-05 DIAGNOSIS — Z8249 Family history of ischemic heart disease and other diseases of the circulatory system: Secondary | ICD-10-CM

## 2024-10-05 DIAGNOSIS — Z79899 Other long term (current) drug therapy: Secondary | ICD-10-CM

## 2024-10-05 DIAGNOSIS — Z882 Allergy status to sulfonamides status: Secondary | ICD-10-CM

## 2024-10-05 DIAGNOSIS — I471 Supraventricular tachycardia, unspecified: Secondary | ICD-10-CM | POA: Diagnosis present

## 2024-10-05 DIAGNOSIS — I209 Angina pectoris, unspecified: Secondary | ICD-10-CM

## 2024-10-05 DIAGNOSIS — Z9049 Acquired absence of other specified parts of digestive tract: Secondary | ICD-10-CM

## 2024-10-05 DIAGNOSIS — Z7982 Long term (current) use of aspirin: Secondary | ICD-10-CM

## 2024-10-05 DIAGNOSIS — Z881 Allergy status to other antibiotic agents status: Secondary | ICD-10-CM

## 2024-10-05 DIAGNOSIS — Z91030 Bee allergy status: Secondary | ICD-10-CM

## 2024-10-05 DIAGNOSIS — I25110 Atherosclerotic heart disease of native coronary artery with unstable angina pectoris: Secondary | ICD-10-CM | POA: Diagnosis present

## 2024-10-05 DIAGNOSIS — K219 Gastro-esophageal reflux disease without esophagitis: Secondary | ICD-10-CM | POA: Diagnosis present

## 2024-10-05 LAB — BASIC METABOLIC PANEL WITH GFR
Anion gap: 9 (ref 5–15)
BUN: 15 mg/dL (ref 8–23)
CO2: 26 mmol/L (ref 22–32)
Calcium: 9.2 mg/dL (ref 8.9–10.3)
Chloride: 105 mmol/L (ref 98–111)
Creatinine, Ser: 0.89 mg/dL (ref 0.44–1.00)
GFR, Estimated: >60 mL/min
Glucose, Bld: 105 mg/dL — ABNORMAL HIGH (ref 70–99)
Potassium: 4.4 mmol/L (ref 3.5–5.1)
Sodium: 141 mmol/L (ref 135–145)

## 2024-10-05 LAB — CBC
HCT: 36.2 % (ref 36.0–46.0)
Hemoglobin: 11.4 g/dL — ABNORMAL LOW (ref 12.0–15.0)
MCH: 29.2 pg (ref 26.0–34.0)
MCHC: 31.5 g/dL (ref 30.0–36.0)
MCV: 92.6 fL (ref 80.0–100.0)
Platelets: 315 K/uL (ref 150–400)
RBC: 3.91 MIL/uL (ref 3.87–5.11)
RDW: 12.5 % (ref 11.5–15.5)
WBC: 5.3 K/uL (ref 4.0–10.5)
nRBC: 0 % (ref 0.0–0.2)

## 2024-10-05 LAB — PRO BRAIN NATRIURETIC PEPTIDE: Pro Brain Natriuretic Peptide: 1421 pg/mL — ABNORMAL HIGH

## 2024-10-05 LAB — TROPONIN T, HIGH SENSITIVITY
Troponin T High Sensitivity: 20 ng/L — ABNORMAL HIGH (ref 0–19)
Troponin T High Sensitivity: 19 ng/L (ref 0–19)

## 2024-10-05 MED ORDER — ROSUVASTATIN CALCIUM 10 MG PO TABS
10.0000 mg | ORAL_TABLET | Freq: Every day | ORAL | Status: DC
  Administered 2024-10-06: 10 mg via ORAL
  Filled 2024-10-05: qty 1

## 2024-10-05 MED ORDER — PANTOPRAZOLE SODIUM 40 MG PO TBEC
40.0000 mg | DELAYED_RELEASE_TABLET | Freq: Every day | ORAL | Status: DC
  Administered 2024-10-06 – 2024-10-08 (×3): 40 mg via ORAL
  Filled 2024-10-05 (×3): qty 1

## 2024-10-05 MED ORDER — ASPIRIN 81 MG PO TBEC
81.0000 mg | DELAYED_RELEASE_TABLET | Freq: Every day | ORAL | Status: DC
  Administered 2024-10-06 – 2024-10-08 (×2): 81 mg via ORAL
  Filled 2024-10-05 (×3): qty 1

## 2024-10-05 MED ORDER — MORPHINE SULFATE (PF) 2 MG/ML IV SOLN
1.0000 mg | INTRAVENOUS | Status: DC | PRN
  Administered 2024-10-06 (×2): 1 mg via INTRAVENOUS
  Filled 2024-10-05 (×2): qty 1

## 2024-10-05 MED ORDER — NITROGLYCERIN 0.4 MG SL SUBL
0.4000 mg | SUBLINGUAL_TABLET | SUBLINGUAL | Status: DC | PRN
  Administered 2024-10-05: 0.4 mg via SUBLINGUAL
  Filled 2024-10-05: qty 1

## 2024-10-05 MED ORDER — OXYCODONE-ACETAMINOPHEN 5-325 MG PO TABS: 1.0000 | ORAL_TABLET | ORAL | Status: DC | PRN

## 2024-10-05 MED ORDER — GABAPENTIN 300 MG PO CAPS
300.0000 mg | ORAL_CAPSULE | Freq: Two times a day (BID) | ORAL | Status: DC
  Administered 2024-10-05 – 2024-10-08 (×6): 300 mg via ORAL
  Filled 2024-10-05 (×6): qty 1

## 2024-10-05 MED ORDER — VITAMIN D 25 MCG (1000 UNIT) PO TABS
1000.0000 [IU] | ORAL_TABLET | Freq: Every day | ORAL | Status: DC
  Administered 2024-10-06 – 2024-10-08 (×3): 1000 [IU] via ORAL
  Filled 2024-10-05 (×3): qty 1

## 2024-10-05 MED ORDER — ONDANSETRON HCL 4 MG/2ML IJ SOLN
4.0000 mg | Freq: Three times a day (TID) | INTRAMUSCULAR | Status: DC | PRN
  Administered 2024-10-06 – 2024-10-08 (×2): 4 mg via INTRAVENOUS
  Filled 2024-10-05 (×2): qty 2

## 2024-10-05 MED ORDER — PROPRANOLOL HCL 20 MG PO TABS
20.0000 mg | ORAL_TABLET | Freq: Three times a day (TID) | ORAL | Status: DC
  Administered 2024-10-05 – 2024-10-06 (×2): 20 mg via ORAL
  Filled 2024-10-05 (×2): qty 1

## 2024-10-05 MED ORDER — HYDRALAZINE HCL 20 MG/ML IJ SOLN: 5.0000 mg | INTRAMUSCULAR | Status: DC | PRN

## 2024-10-05 MED ORDER — ASPIRIN 81 MG PO CHEW
324.0000 mg | CHEWABLE_TABLET | Freq: Once | ORAL | Status: DC
  Filled 2024-10-05: qty 4

## 2024-10-05 MED ORDER — NITROGLYCERIN 0.4 MG SL SUBL
0.4000 mg | SUBLINGUAL_TABLET | SUBLINGUAL | Status: DC | PRN
  Administered 2024-10-06 – 2024-10-07 (×4): 0.4 mg via SUBLINGUAL
  Filled 2024-10-05 (×4): qty 1

## 2024-10-05 MED ORDER — ACETAMINOPHEN 325 MG PO TABS
650.0000 mg | ORAL_TABLET | Freq: Four times a day (QID) | ORAL | Status: DC | PRN
  Administered 2024-10-08: 650 mg via ORAL
  Filled 2024-10-05: qty 2

## 2024-10-05 MED ORDER — ENOXAPARIN SODIUM 40 MG/0.4ML IJ SOSY
40.0000 mg | PREFILLED_SYRINGE | INTRAMUSCULAR | Status: DC
  Administered 2024-10-05: 40 mg via SUBCUTANEOUS
  Filled 2024-10-05: qty 0.4

## 2024-10-05 MED ORDER — ISOSORBIDE MONONITRATE ER 60 MG PO TB24
120.0000 mg | ORAL_TABLET | Freq: Every day | ORAL | Status: DC
  Administered 2024-10-06 – 2024-10-08 (×3): 120 mg via ORAL
  Filled 2024-10-05 (×3): qty 2

## 2024-10-05 NOTE — H&P (Signed)
 " History and Physical    Amanda Lambert FMW:[id number] DOB: Feb 17, 1946 DOA: 10/05/2024  Referring MD/NP/PA:   PCP: Cleotilde, [place]  E, PA   Patient coming from:  The patient is coming from home.     Chief Complaint: chest pain  HPI: Amanda Lambert is a 79 y.o. female with medical history significant of CAD, s/p of stent to LAD, dCHF, LBBB, HTN, HLD, CKD-3a, IBS, varicose vein, bilateral carotid artery stenosis, who presents with chest pain.  Patient states that she has intermittent chest pain in the past several days.  The chest pain is located in the substernal and the left side of her chest, come and goes, moderate, pressure-like, radiating to bilateral shoulder and arms, not associated with SOB.  No cough, fever or chills.  No recent long distant traveling.  No tenderness in calf area.  Has nausea, no vomiting, diarrhea or abdominal pain.  No symptoms UTI.  No recent fall or head injury.  No dark stool or rectal bleeding. Pt states that she took 650 mg of ASA mixed with gabapentin  this morning for back pain.   Of note, pt was seen in ED on 10/03/23. She was encouraged to stay for admission, but she declined.  Data reviewed independently and ED Course: pt was found to have troponin 20, WBC 5.3, GFR> 60, temperature normal, blood pressure 162/99, 194/95, heart rate 73, oxygen sat 99% on room air.  Chest x-ray reviewed.  Patient is placed in telemetry bed of observation. Message is sent to [facility] pool for [facility] card consult   EKG: I have personally reviewed.  Sinus rhythm, QTc 433, left bundle blockade, LAD, poor R wave progression, anteroseptal infarction pattern   Review of Systems:   General: no fevers, chills, no body weight gain, has fatigue HEENT: no blurry vision, hearing changes or sore throat Respiratory: no dyspnea, coughing, wheezing CV: has chest pain, no palpitations GI: has nausea, no vomiting, abdominal pain, diarrhea, constipation GU: no dysuria, burning on urination,  increased urinary frequency, hematuria  Ext: has trace leg edema Neuro: no unilateral weakness, numbness, or tingling, no vision change or hearing loss Skin: no rash, no skin tear. MSK: No muscle spasm, no deformity, no limitation of range of movement in spin. Has chronic back pain. Heme: No easy bruising.  Travel history: No recent long distant travel.   Allergy: Allergies[1]  Past Medical History:  Diagnosis Date   Anemia    Angina pectoris    Aortic atherosclerosis    Arthritis    Bilateral carotid artery disease 10/15/2019   a.) carotid doppler 10/15/2019: 50-69% BICA; b.) carotid doppler 04/06/2020: 50-69% RICA, 1-15% LICA; c.) carotid doppler 11/03/2020 and 04/29/2021: 50-69% RICA, 16-49% LICA; d.) carotid doppler 03/02/2022: 50-69% BICA; e.) carotid doppler 03/14/2023: 50-69% RICA, 16-49% LICA   Biliary dyskinesia    CAD (coronary artery disease) 06/14/2021   a.) LHC 11/04/2019: 50% mLAD - med mgmt; b.) LHC/PCI 06/14/2021: 40% p-mLAD, 80% mLAD (3.0 x 30 mm Onyx Frontier DES; c.) LHC for FFR study: 07/19/2021: 40% pLAD; RFR 0.92, FFR 0.89, CFR 4.8, IMR 18 --> no significant macro/microvascular disease   CKD (chronic kidney disease), stage III (HCC)    Complication of anesthesia    a.) PONV; b.) delayed emergence   Constipation    Diastolic dysfunction 10/15/2019   a.) TTE 10/15/2019: EF 50-55%, norm LAP, mild LA dil, mod MR, mild-mod TR, G1DD; b.) TTE 05/19/2021: EF 50-55%, mild basal sep hypertrophy, norm LAP, triv MR/TR, G1DD  GERD (gastroesophageal reflux disease)    Hepatitis B 1975   Hypercholesteremia    Hypertension    Inguinal hernia    Irritable bowel syndrome without diarrhea    LBBB (left bundle branch block)    Leukopenia    Long term current use of aspirin     PONV (postoperative nausea and vomiting)    [facility] spotted fever 04/01/2012   adm. to hospital   Syncope 10/17/2019   Thrombocytopenia    Transaminitis    Trochanteric bursitis, right hip     Vaginal Pap smear, abnormal    Varicose veins of leg with edema, bilateral    Weakness     Past Surgical History:  Procedure Laterality Date   APPENDECTOMY  09/25/1961   CHOLECYSTECTOMY  04/26/2012   Procedure: LAPAROSCOPIC CHOLECYSTECTOMY WITH INTRAOPERATIVE CHOLANGIOGRAM;  Surgeon: Deward GORMAN Curvin DOUGLAS, MD;  Location: [facility] ORS;  Service: General;  Laterality: N/A;   COLONOSCOPY     CORONARY ANGIOGRAPHY N/A 07/19/2021   Procedure: CORONARY ANGIOGRAPHY (CATH LAB);  Surgeon: Ladona Heinz, MD;  Location: [facility] INVASIVE CV LAB;  Service: Cardiovascular;  Laterality: N/A;   CORONARY IMAGING/OCT N/A 06/14/2021   Procedure: INTRAVASCULAR IMAGING/OCT;  Surgeon: Ladona Heinz, MD;  Location: [facility] INVASIVE CV LAB;  Service: Cardiovascular;  Laterality: N/A;   CORONARY PRESSURE/FFR STUDY N/A 07/19/2021   Procedure: INTRAVASCULAR PRESSURE WIRE/FFR STUDY;  Surgeon: Ladona Heinz, MD;  Location: [facility] INVASIVE CV LAB;  Service: Cardiovascular;  Laterality: N/A;   CORONARY STENT INTERVENTION N/A 06/14/2021   Procedure: CORONARY STENT INTERVENTION;  Surgeon: Ladona Heinz, MD;  Location: [facility] INVASIVE CV LAB;  Service: Cardiovascular;  Laterality: N/A;   DILATION AND CURETTAGE OF UTERUS  09/25/1980   for miscarriage   DILATION AND CURETTAGE, DIAGNOSTIC / THERAPEUTIC  09/25/1970   INSERTION OF MESH Right 05/03/2023   Procedure: INSERTION OF MESH;  Surgeon: Tye Millet, DO;  Location: [facility] ORS;  Service: General;  Laterality: Right;   IR KYPHO EA ADDL LEVEL THORACIC OR LUMBAR  09/05/2023   IR KYPHO LUMBAR INC FX REDUCE BONE BX UNI/BIL CANNULATION INC/IMAGING  09/05/2023   IR KYPHO THORACIC WITH BONE BIOPSY  09/05/2023   LEFT HEART CATH AND CORONARY ANGIOGRAPHY N/A 11/04/2019   Procedure: LEFT HEART CATH AND CORONARY ANGIOGRAPHY;  Surgeon: Ladona Heinz, MD;  Location: [facility] INVASIVE CV LAB;  Service: Cardiovascular;  Laterality: N/A;   LEFT HEART CATH AND CORONARY ANGIOGRAPHY N/A 06/14/2021   Procedure: LEFT HEART CATH AND CORONARY  ANGIOGRAPHY;  Surgeon: Ladona Heinz, MD;  Location: [facility] INVASIVE CV LAB;  Service: Cardiovascular;  Laterality: N/A;   TONSILLECTOMY  1964  - approximate   TOTAL SHOULDER ARTHROPLASTY Right 01/28/2024   WRIST FRACTURE SURGERY  09/25/2006   right    Social History:  reports that she has never smoked. She has never used smokeless tobacco. She reports that she does not drink alcohol and does not use drugs.  Family History:  Family History  Problem Relation Age of Onset   Cardiomyopathy Mother    Coronary artery disease Father    Hypertension Brother    Prostate cancer Brother      Prior to Admission medications  Medication Sig Start Date End Date Taking? Authorizing Provider  Abaloparatide  3120 MCG/1.56ML SOPN Inject 3,120 mcg into the skin every 30 (thirty) days.    [provider]  aspirin  EC 81 MG tablet Take 81 mg by mouth daily. Swallow whole.    [provider]  calcitonin, salmon, (MIACALCIN /FORTICAL) 200 UNIT/ACT nasal  spray Place 1 spray into alternate nostrils daily. 09/03/23   Barbarann Nest, MD  cyclobenzaprine  (FLEXERIL ) 10 MG tablet TAKE 1 TABLET BY MOUTH THREE TIMES A DAY AS NEEDED FOR MUSCLE SPASMS 03/23/24   Gregory Edsel Ruth, PA  EPINEPHrine  0.3 mg/0.3 mL IJ SOAJ injection Inject 0.3 mg into the muscle once as needed for anaphylaxis.    [provider]  esomeprazole (NEXIUM) 40 MG capsule Take 40 mg by mouth in the morning.    [provider]  estradiol  (ESTRACE ) 0.1 MG/GM vaginal cream Place 1 g vaginally 3 (three) times a week. Patient not taking: Reported on 03/06/2024 07/02/23   Cleatus Moccasin, MD  furosemide  (LASIX ) 20 MG tablet Take 1 tablet (20 mg total) by mouth daily as needed. 10/30/23   Ladona Heinz, MD  gabapentin  (NEURONTIN ) 300 MG capsule Take 300 mg by mouth 2 (two) times daily. 02/14/21   [provider]  isosorbide  mononitrate (IMDUR ) 120 MG 24 hr tablet TAKE 1 TABLET BY MOUTH EVERY DAY 03/25/24   Jerilynn Lamarr HERO, NP   lidocaine  (LIDODERM ) 5 % Place 1 patch onto the skin daily. Remove & Discard patch within 12 hours or as directed by MD Patient not taking: Reported on 10/02/2024 09/02/23   Barbarann Nest, MD  magnesium  30 MG tablet Take 30 mg by mouth 2 (two) times daily.    [provider]  MYRBETRIQ 25 MG TB24 tablet Take 25 mg by mouth daily. 07/25/24   [provider]  nitroGLYCERIN  (NITROSTAT ) 0.4 MG SL tablet Place 1 tablet (0.4 mg total) under the tongue every 5 (five) minutes as needed for up to 25 days for chest pain. 08/03/23 10/02/24  Jerilynn Lamarr HERO, NP  ondansetron  (ZOFRAN -ODT) 4 MG disintegrating tablet Take 1 tablet (4 mg total) by mouth every 8 (eight) hours as needed for nausea or vomiting. 01/29/24   Franaszek, Amanda, PA-C  potassium chloride  SA (KLOR-CON  M20) 20 MEQ tablet TAKE 1 TABLET BY MOUTH EVERY DAY Patient not taking: No sig reported 10/30/23   Ladona Heinz, MD  Probiotic Product (ALIGN PO) Take by mouth.    [provider]  propranolol  (INDERAL ) 20 MG tablet Take 1 tablet (20 mg total) by mouth 3 (three) times daily. 09/24/24   Ladona Heinz, MD  rosuvastatin  (CRESTOR ) 10 MG tablet TAKE 1 TABLET BY MOUTH EVERY DAY 09/02/24   Ladona Heinz, MD  traMADol (ULTRAM) 50 MG tablet Take 50 mg by mouth every 6 (six) hours as needed. Patient not taking: Reported on 10/02/2024 02/13/24   [provider]  VITAMIN D  PO Take 1 capsule by mouth every other day. In the morning    [provider]  Vitamin D , Ergocalciferol , (DRISDOL) 1.25 MG (50000 UNIT) CAPS capsule Take 1 capsule by mouth every 7 (seven) days.    [provider]    Physical Exam: Vitals:   10/05/24 1822 10/05/24 1823 10/05/24 2130 10/05/24 2314  BP:  (!) 162/99 (!) 194/95 (!) 173/83  Pulse:  73 71 68  Resp:  18 20   Temp:  (!) 97.5 F (36.4 C)    TempSrc:  Oral    SpO2:  99% 98%   Weight: 81.6 kg      General: Not in acute distress HEENT:       Eyes: PERRL, EOMI, no jaundice        ENT: No discharge from the ears and nose, no pharynx injection, no tonsillar enlargement.        Neck: No  JVD, no bruit, no mass felt. Heme: No neck lymph node enlargement. Cardiac: S1/S2, RRR, No gallops or rubs. Respiratory: No rales, wheezing, rhonchi or rubs. GI: Soft, nondistended, nontender, no rebound pain, no organomegaly, BS present. GU: No hematuria Ext: has trace leg edema bilaterally. 1+DP/PT pulse bilaterally. Musculoskeletal: No joint deformities, No joint redness or warmth, no limitation of ROM in spin. Skin: No rashes.  Neuro: Alert, oriented X3, cranial nerves II-XII grossly intact, moves all extremities normally.   Psych: Patient is not psychotic, no suicidal or hemocidal ideation.  Labs on Admission: I have personally reviewed following labs and imaging studies  CBC: Recent Labs  Lab 10/02/24 0955 10/05/24 1827  WBC 6.0 5.3  HGB 12.1 11.4*  HCT 38.1 36.2  MCV 91.6 92.6  PLT 307 315   Basic Metabolic Panel: Recent Labs  Lab 10/02/24 0955 10/05/24 1827  NA 141 141  K 3.9 4.4  CL 103 105  CO2 29 26  GLUCOSE 94 105*  BUN 13 15  CREATININE 0.84 0.89  CALCIUM  9.6 9.2   GFR: Estimated Creatinine Clearance: 55.8 mL/min (by C-G formula based on SCr of 0.89 mg/dL). Liver Function Tests: Recent Labs  Lab 10/02/24 0955  AST 20  ALT 9  ALKPHOS 102  BILITOT 0.6  PROT 7.1  ALBUMIN 4.2   No results for input(s): LIPASE, AMYLASE in the last 168 hours. No results for input(s): AMMONIA in the last 168 hours. Coagulation Profile: Recent Labs  Lab 10/05/24 2218  INR 1.0   Cardiac Enzymes: No results for input(s): CKTOTAL, CKMB, CKMBINDEX, TROPONINI in the last 168 hours. BNP (last 3 results) Recent Labs    10/05/24 2218  PROBNP 1,421.0*   HbA1C: No results for input(s): HGBA1C in the last 72 hours. CBG: No results for input(s): GLUCAP in the last 168 hours. Lipid Profile: No results for input(s): CHOL, HDL, LDLCALC,  TRIG, CHOLHDL, LDLDIRECT in the last 72 hours. Thyroid  Function Tests: No results for input(s): TSH, T4TOTAL, FREET4, T3FREE, THYROIDAB in the last 72 hours. Anemia Panel: No results for input(s): VITAMINB12, FOLATE, FERRITIN, TIBC, IRON, RETICCTPCT in the last 72 hours. Urine analysis:    Component Value Date/Time   COLORURINE ORANGE (A) 02/18/2024 1329   APPEARANCEUR CLOUDY (A) 02/18/2024 1329   LABSPEC  02/18/2024 1329    TEST NOT REPORTED DUE TO COLOR INTERFERENCE OF URINE PIGMENT   PHURINE  02/18/2024 1329    TEST NOT REPORTED DUE TO COLOR INTERFERENCE OF URINE PIGMENT   GLUCOSEU (A) 02/18/2024 1329    TEST NOT REPORTED DUE TO COLOR INTERFERENCE OF URINE PIGMENT   HGBUR (A) 02/18/2024 1329    TEST NOT REPORTED DUE TO COLOR INTERFERENCE OF URINE PIGMENT   BILIRUBINUR moderate (A) 08/06/2024 1450   KETONESUR small (15) (A) 08/06/2024 1450   KETONESUR (A) 02/18/2024 1329    TEST NOT REPORTED DUE TO COLOR INTERFERENCE OF URINE PIGMENT   PROTEINUR (A) 02/18/2024 1329    TEST NOT REPORTED DUE TO COLOR INTERFERENCE OF URINE PIGMENT   UROBILINOGEN >=8.0 (A) 08/06/2024 1450   UROBILINOGEN 2.0 (H) 04/01/2012 0433   NITRITE Positive (A) 08/06/2024 1450   NITRITE (A) 02/18/2024 1329    TEST NOT REPORTED DUE TO COLOR INTERFERENCE OF URINE PIGMENT   LEUKOCYTESUR Large (3+) (A) 08/06/2024 1450   LEUKOCYTESUR (A) 02/18/2024 1329    TEST NOT REPORTED DUE TO COLOR INTERFERENCE OF URINE PIGMENT   Sepsis Labs: @LABRCNTIP (procalcitonin:4,lacticidven:4) )No results found for this or any previous visit (from the  past 240 hours).   Radiological Exams on Admission:   Assessment/Plan Principal Problem:   Chest pain Active Problems:   CAD (coronary artery disease)   HTN (hypertension)   Hypercholesteremia   Chronic diastolic CHF (congestive heart failure) (HCC)   Chronic kidney disease, stage 3a (HCC)   Normocytic anemia   Overweight (BMI  25.0-29.9)   Assessment and Plan:  Chest pain and hx of CAD: s/p of stent to LAD. Trop is 20.   - place to tele bed for observation - Trend Trop - prn Morphine , Percocet, Tylenol  for pain - prn Nitroglycerin  - Imdur  and Inderal  - ASA 81 mg daily (pt took 650 mg of ASA at home) - Statin: Crestor  - Risk factor stratification: will check FLP and A1C  - 2d echo  HTN (hypertension) -Imdur , Inderal  - IV hydralazine  as needed  Hypercholesteremia -Crestor   Chronic diastolic CHF (congestive heart failure) (HCC): 2D echo on 10/15/2019 showed EF of 50-55%.  Patient has trace leg edema, no SOB.  CHF seem to be compensated, but pro-BNP is elevated 1421. Patient is taking as needed Lasix  at home - Start oral Lasix  20 mg daily  Chronic kidney disease, stage 3a [facility]): Renal function stable.  GFR > 60 today -Follow-up with BMP  Normocytic anemia: Hemoglobin 12.1 on 10/02/2024 --> 11.4 today.  No active bleeding. - Follow-up repeat CBC  Overweight (BMI 25.0-29.9): Body weight 85.6 kg, BMI 29.95 - Encourage losing weight - Exercise healthy diet      DVT ppx: SQ Lovenox   Code Status: DNR per pt  Family Communication:     not done, no family member is at bed side.   Disposition Plan:  Anticipate discharge back to previous environment  Consults called:  message is sent to [facility] pool for [facility] card consult  Admission status and Level of care: Telemetry as inpt        Dispo: The patient is from: Home              Anticipated d/c is to: Home              Anticipated d/c date is: 1 day              Patient currently is not medically stable to d/c.    Severity of Illness:  The appropriate patient status for this patient is OBSERVATION. Observation status is judged to be reasonable and necessary in order to provide the required intensity of service to ensure the patient's safety. The patient's presenting symptoms, physical exam findings, and initial radiographic and laboratory  data in the context of their medical condition is felt to place them at decreased risk for further clinical deterioration. Furthermore, it is anticipated that the patient will be medically stable for discharge from the hospital within 2 midnights of admission.        Date of Service 10/06/2024    Caleb Exon [facility]   If 7PM-7AM, please contact night-coverage [email or web address] 10/06/2024, 12:15 AM     [1]  Allergies Allergen Reactions   Ace Inhibitors Anaphylaxis    Angioedema.   Bee Venom Anaphylaxis and Other (See Comments)   Levofloxacin      Other Reaction(s): Not available  levofloxacin    Shellfish Allergy Hives, Rash and Dermatitis    splotches  Shellfish (substance)   Thorazine [Chlorpromazine] Anaphylaxis   Wasp Venom Anaphylaxis   Levaquin  [Levofloxacin  In D5w] Nausea And Vomiting    Stated by patient she could not handle levaquin  even with  antiemetics    Amlodipine      severe leg edema   Atropine Sulfate Other (See Comments)   Elemental Sulfur Itching        Glycopyrrolate  Other (See Comments)   Ramipril Other (See Comments)   Sulfa Antibiotics Other (See Comments)   Compazine [Prochlorperazine Edisylate] Anxiety   Latex Rash   "

## 2024-10-05 NOTE — ED Provider Notes (Signed)
 "  [facility] Provider Note    Event Date/Time   First MD Initiated Contact with Patient 10/05/24 2018     (approximate)   History   Chief Complaint: Chest Pain   HPI  Amanda Lambert is a 79 y.o. female with history of hypertension CHF CKD who comes ED complaining of intermittent chest pain for the past 3 days.  Not specifically exertional.  Radiates to the left arm.  No shortness of breath or diaphoresis or vomiting.  Has taken nitroglycerin  at home with some improvement.  Has a history of CAD with a stent in the LAD 3 years ago.  Compliant with her medications.  She was seen in the ED a few days ago after symptoms for started, declined hospitalization at that time.  Called her cardiologist for follow-up appointment which will not be for another 2 weeks.        Past Medical History:  Diagnosis Date   Anemia    Angina pectoris    Aortic atherosclerosis    Arthritis    Bilateral carotid artery disease 10/15/2019   a.) carotid doppler 10/15/2019: 50-69% BICA; b.) carotid doppler 04/06/2020: 50-69% RICA, 1-15% LICA; c.) carotid doppler 11/03/2020 and 04/29/2021: 50-69% RICA, 16-49% LICA; d.) carotid doppler 03/02/2022: 50-69% BICA; e.) carotid doppler 03/14/2023: 50-69% RICA, 16-49% LICA   Biliary dyskinesia    CAD (coronary artery disease) 06/14/2021   a.) LHC 11/04/2019: 50% mLAD - med mgmt; b.) LHC/PCI 06/14/2021: 40% p-mLAD, 80% mLAD (3.0 x 30 mm Onyx Frontier DES; c.) LHC for FFR study: 07/19/2021: 40% pLAD; RFR 0.92, FFR 0.89, CFR 4.8, IMR 18 --> no significant macro/microvascular disease   CKD (chronic kidney disease), stage III (HCC)    Complication of anesthesia    a.) PONV; b.) delayed emergence   Constipation    Diastolic dysfunction 10/15/2019   a.) TTE 10/15/2019: EF 50-55%, norm LAP, mild LA dil, mod MR, mild-mod TR, G1DD; b.) TTE 05/19/2021: EF 50-55%, mild basal sep hypertrophy, norm LAP, triv MR/TR, G1DD   GERD (gastroesophageal reflux  disease)    Hepatitis B 1975   Hypercholesteremia    Hypertension    Inguinal hernia    Irritable bowel syndrome without diarrhea    LBBB (left bundle branch block)    Leukopenia    Long term current use of aspirin     PONV (postoperative nausea and vomiting)    [facility] spotted fever 04/01/2012   adm. to hospital   Syncope 10/17/2019   Thrombocytopenia    Transaminitis    Trochanteric bursitis, right hip    Vaginal Pap smear, abnormal    Varicose veins of leg with edema, bilateral    Weakness     Current Outpatient Rx   Order #: [id number] Class: Historical Med   Order #: [id number] Class: Normal   Order #: [id number] Class: Historical Med   Order #: [id number] Class: Historical Med   Order #: [id number] Class: Normal   Order #: [id number] Class: Historical Med   Order #: [id number] Class: Normal   Order #: [id number] Class: Normal   Order #: [id number] Class: Normal   Order #: [id number] Class: Normal   Order #: [id number] Class: Historical Med   Order #: [id number] Class: Historical Med   Order #: [id number] Class: Historical Med   Order #: [id number] Class: Normal   Order #: [id number] Class: Normal   Order #: [id number] Class: Normal   Order #: [id number] Class: Historical Med   Order #: [id number] Class: Historical Med   Order #: [id number] Class: Normal   Order #: [id number] Class: Normal  Order #: [id number] Class: Historical Med   Order #: [id number] Class: Historical Med    Past Surgical History:  Procedure Laterality Date   APPENDECTOMY  09/25/1961   CHOLECYSTECTOMY  04/26/2012   Procedure: LAPAROSCOPIC CHOLECYSTECTOMY WITH INTRAOPERATIVE CHOLANGIOGRAM;  Surgeon: Deward GORMAN Curvin DOUGLAS, MD;  Location: [facility] ORS;  Service: General;  Laterality: N/A;   COLONOSCOPY     CORONARY ANGIOGRAPHY N/A 07/19/2021   Procedure: CORONARY ANGIOGRAPHY (CATH LAB);  Surgeon: Ladona Heinz, MD;  Location: [facility] INVASIVE CV LAB;  Service: Cardiovascular;  Laterality: N/A;   CORONARY IMAGING/OCT N/A 06/14/2021   Procedure:  INTRAVASCULAR IMAGING/OCT;  Surgeon: Ladona Heinz, MD;  Location: [facility] INVASIVE CV LAB;  Service: Cardiovascular;  Laterality: N/A;   CORONARY PRESSURE/FFR STUDY N/A 07/19/2021   Procedure: INTRAVASCULAR PRESSURE WIRE/FFR STUDY;  Surgeon: Ladona Heinz, MD;  Location: [facility] INVASIVE CV LAB;  Service: Cardiovascular;  Laterality: N/A;   CORONARY STENT INTERVENTION N/A 06/14/2021   Procedure: CORONARY STENT INTERVENTION;  Surgeon: Ladona Heinz, MD;  Location: [facility] INVASIVE CV LAB;  Service: Cardiovascular;  Laterality: N/A;   DILATION AND CURETTAGE OF UTERUS  09/25/1980   for miscarriage   DILATION AND CURETTAGE, DIAGNOSTIC / THERAPEUTIC  09/25/1970   INSERTION OF MESH Right 05/03/2023   Procedure: INSERTION OF MESH;  Surgeon: Tye Millet, DO;  Location: [facility] ORS;  Service: General;  Laterality: Right;   IR KYPHO EA ADDL LEVEL THORACIC OR LUMBAR  09/05/2023   IR KYPHO LUMBAR INC FX REDUCE BONE BX UNI/BIL CANNULATION INC/IMAGING  09/05/2023   IR KYPHO THORACIC WITH BONE BIOPSY  09/05/2023   LEFT HEART CATH AND CORONARY ANGIOGRAPHY N/A 11/04/2019   Procedure: LEFT HEART CATH AND CORONARY ANGIOGRAPHY;  Surgeon: Ladona Heinz, MD;  Location: [facility] INVASIVE CV LAB;  Service: Cardiovascular;  Laterality: N/A;   LEFT HEART CATH AND CORONARY ANGIOGRAPHY N/A 06/14/2021   Procedure: LEFT HEART CATH AND CORONARY ANGIOGRAPHY;  Surgeon: Ladona Heinz, MD;  Location: [facility] INVASIVE CV LAB;  Service: Cardiovascular;  Laterality: N/A;   TONSILLECTOMY  1964  - approximate   TOTAL SHOULDER ARTHROPLASTY Right 01/28/2024   WRIST FRACTURE SURGERY  09/25/2006   right    Physical Exam   Triage Vital Signs: ED Triage Vitals  Encounter Vitals Group     BP 10/05/24 1823 (!) 162/99     Girls Systolic BP Percentile --      Girls Diastolic BP Percentile --      Boys Systolic BP Percentile --      Boys Diastolic BP Percentile --      Pulse Rate 10/05/24 1823 73     Resp 10/05/24 1823 18     Temp 10/05/24 1823 (!) 97.5 F (36.4 C)      Temp Source 10/05/24 1823 Oral     SpO2 10/05/24 1823 99 %     Weight 10/05/24 1822 180 lb (81.6 kg)     Height --      Head Circumference --      Peak Flow --      Pain Score 10/05/24 1822 4     Pain Loc --      Pain Education --      Exclude from Growth Chart --     Most recent vital signs: Vitals:   10/05/24 2130 10/05/24 2314  BP: (!) 194/95 (!) 173/83  Pulse: 71 68  Resp: 20   Temp:    SpO2: 98%     General: Awake, no distress.  CV:  Good peripheral perfusion.  Regular rate rhythm Resp:  Normal effort.  Clear lungs Abd:  No distention.  Soft nontender Other:  No lower extremity edema.   ED Results / Procedures / Treatments   Labs (all labs ordered are listed, but only abnormal results are displayed) Labs Reviewed  BASIC METABOLIC PANEL WITH GFR - Abnormal; Notable for the following components:      Result Value   Glucose, Bld 105 (*)    All other components within normal limits  CBC - Abnormal; Notable for the following components:   Hemoglobin 11.4 (*)    All other components within normal limits  PRO BRAIN NATRIURETIC PEPTIDE - Abnormal; Notable for the following components:   Pro Brain Natriuretic Peptide 1,421.0 (*)    All other components within normal limits  TROPONIN T, HIGH SENSITIVITY - Abnormal; Notable for the following components:   Troponin T High Sensitivity 20 (*)    All other components within normal limits  HEMOGLOBIN A1C  LIPID PANEL  BASIC METABOLIC PANEL WITH GFR  CBC  PROTIME-INR  APTT  TROPONIN T, HIGH SENSITIVITY  TROPONIN T, HIGH SENSITIVITY     EKG Interpreted by me Atrial fibrillation, rate of 64.  Left axis, left bundle branch block.  No acute ischemic changes.   RADIOLOGY Chest x-ray interpreted by me, unremarkable.  Radiology report reviewed   PROCEDURES:  Procedures   MEDICATIONS ORDERED IN ED: Medications  morphine  (PF) 2 MG/ML injection 1 mg (has no administration in time range)  oxyCODONE -acetaminophen   (PERCOCET/ROXICET) 5-325 MG per tablet 1 tablet (has no administration in time range)  acetaminophen  (TYLENOL ) tablet 650 mg (has no administration in time range)  ondansetron  (ZOFRAN ) injection 4 mg (has no administration in time range)  hydrALAZINE  (APRESOLINE ) injection 5 mg (has no administration in time range)  enoxaparin  (LOVENOX ) injection 40 mg (40 mg Subcutaneous Given 10/05/24 2200)  aspirin  EC tablet 81 mg (has no administration in time range)  isosorbide  mononitrate (IMDUR ) 24 hr tablet 120 mg (has no administration in time range)  nitroGLYCERIN  (NITROSTAT ) SL tablet 0.4 mg (has no administration in time range)  propranolol  (INDERAL ) tablet 20 mg (20 mg Oral Given 10/05/24 2314)  rosuvastatin  (CRESTOR ) tablet 10 mg (has no administration in time range)  pantoprazole  (PROTONIX ) EC tablet 40 mg (has no administration in time range)  gabapentin  (NEURONTIN ) capsule 300 mg (300 mg Oral Given 10/05/24 2314)  cholecalciferol  (VITAMIN D3) 25 MCG (1000 UNIT) tablet 1,000 Units (has no administration in time range)     IMPRESSION / MDM / ASSESSMENT AND PLAN / ED COURSE  I reviewed the triage vital signs and the nursing notes.  DDx: NSTEMI, electrolyte derangement, anemia, GERD, pneumothorax, pneumonia, paroxysmal A-fib  Patient's presentation is most consistent with acute presentation with potential threat to life or bodily function.  Patient presents with frequent episodes of radiating chest pain over the past 3 days in the setting of comorbidities, known CAD.  Unable to obtain expeditious outpatient cardiology follow-up.  Initial EKG, chest x-ray, labs without severe abnormalities.   Clinical Course as of 10/06/24 0003  [place] Oct 05, 2024  2110 Case discussed with hospitalist [PS]    Clinical Course User Index [PS] Viviann Pastor, MD     FINAL CLINICAL IMPRESSION(S) / ED DIAGNOSES   Final diagnoses:  Chest pain with moderate risk for cardiac etiology     Rx / DC Orders    ED Discharge Orders     None        Note:  This  document was prepared using Conservation officer, historic buildings and may include unintentional dictation errors.   Viviann Pastor, MD 10/06/24 0003  "

## 2024-10-05 NOTE — ED Triage Notes (Signed)
 Pt presents to ED from home C/O intermittent chest pain and nausea since 1600 today. Took nitroglycerin  X 2 doses prior to arrival, reports it has helped a little. Seen 3 days ago for same, encouraged to stay for admission, but declined.

## 2024-10-06 ENCOUNTER — Other Ambulatory Visit (HOSPITAL_COMMUNITY): Payer: Self-pay

## 2024-10-06 ENCOUNTER — Encounter: Payer: Self-pay | Admitting: Cardiovascular Disease

## 2024-10-06 ENCOUNTER — Inpatient Hospital Stay
Admit: 2024-10-06 | Discharge: 2024-10-06 | Disposition: A | Attending: Cardiovascular Disease | Admitting: Cardiovascular Disease

## 2024-10-06 ENCOUNTER — Telehealth (HOSPITAL_COMMUNITY): Payer: Self-pay

## 2024-10-06 ENCOUNTER — Encounter: Admission: EM | Disposition: A | Payer: Self-pay | Source: Home / Self Care | Attending: Internal Medicine

## 2024-10-06 ENCOUNTER — Other Ambulatory Visit: Payer: Self-pay

## 2024-10-06 DIAGNOSIS — R079 Chest pain, unspecified: Secondary | ICD-10-CM

## 2024-10-06 DIAGNOSIS — I25110 Atherosclerotic heart disease of native coronary artery with unstable angina pectoris: Secondary | ICD-10-CM | POA: Diagnosis present

## 2024-10-06 DIAGNOSIS — Z881 Allergy status to other antibiotic agents status: Secondary | ICD-10-CM | POA: Diagnosis not present

## 2024-10-06 DIAGNOSIS — D649 Anemia, unspecified: Secondary | ICD-10-CM | POA: Diagnosis present

## 2024-10-06 DIAGNOSIS — N1831 Chronic kidney disease, stage 3a: Secondary | ICD-10-CM | POA: Diagnosis present

## 2024-10-06 DIAGNOSIS — I10 Essential (primary) hypertension: Secondary | ICD-10-CM | POA: Diagnosis not present

## 2024-10-06 DIAGNOSIS — T82855A Stenosis of coronary artery stent, initial encounter: Secondary | ICD-10-CM | POA: Diagnosis present

## 2024-10-06 DIAGNOSIS — E663 Overweight: Secondary | ICD-10-CM | POA: Diagnosis present

## 2024-10-06 DIAGNOSIS — E785 Hyperlipidemia, unspecified: Secondary | ICD-10-CM

## 2024-10-06 DIAGNOSIS — Z91040 Latex allergy status: Secondary | ICD-10-CM | POA: Diagnosis not present

## 2024-10-06 DIAGNOSIS — Z882 Allergy status to sulfonamides status: Secondary | ICD-10-CM | POA: Diagnosis not present

## 2024-10-06 DIAGNOSIS — I130 Hypertensive heart and chronic kidney disease with heart failure and stage 1 through stage 4 chronic kidney disease, or unspecified chronic kidney disease: Secondary | ICD-10-CM | POA: Diagnosis present

## 2024-10-06 DIAGNOSIS — I5030 Unspecified diastolic (congestive) heart failure: Secondary | ICD-10-CM | POA: Diagnosis not present

## 2024-10-06 DIAGNOSIS — G250 Essential tremor: Secondary | ICD-10-CM | POA: Diagnosis not present

## 2024-10-06 DIAGNOSIS — Z888 Allergy status to other drugs, medicaments and biological substances status: Secondary | ICD-10-CM | POA: Diagnosis not present

## 2024-10-06 DIAGNOSIS — Z8249 Family history of ischemic heart disease and other diseases of the circulatory system: Secondary | ICD-10-CM | POA: Diagnosis not present

## 2024-10-06 DIAGNOSIS — E876 Hypokalemia: Secondary | ICD-10-CM | POA: Diagnosis present

## 2024-10-06 DIAGNOSIS — E7800 Pure hypercholesterolemia, unspecified: Secondary | ICD-10-CM | POA: Diagnosis present

## 2024-10-06 DIAGNOSIS — Z7982 Long term (current) use of aspirin: Secondary | ICD-10-CM | POA: Diagnosis not present

## 2024-10-06 DIAGNOSIS — T502X5A Adverse effect of carbonic-anhydrase inhibitors, benzothiadiazides and other diuretics, initial encounter: Secondary | ICD-10-CM | POA: Diagnosis present

## 2024-10-06 DIAGNOSIS — Z66 Do not resuscitate: Secondary | ICD-10-CM | POA: Diagnosis present

## 2024-10-06 DIAGNOSIS — I200 Unstable angina: Secondary | ICD-10-CM

## 2024-10-06 DIAGNOSIS — Z91013 Allergy to seafood: Secondary | ICD-10-CM | POA: Diagnosis not present

## 2024-10-06 DIAGNOSIS — I25118 Atherosclerotic heart disease of native coronary artery with other forms of angina pectoris: Secondary | ICD-10-CM | POA: Diagnosis not present

## 2024-10-06 DIAGNOSIS — I214 Non-ST elevation (NSTEMI) myocardial infarction: Secondary | ICD-10-CM | POA: Diagnosis not present

## 2024-10-06 DIAGNOSIS — I471 Supraventricular tachycardia, unspecified: Secondary | ICD-10-CM | POA: Diagnosis present

## 2024-10-06 DIAGNOSIS — Z79899 Other long term (current) drug therapy: Secondary | ICD-10-CM | POA: Diagnosis not present

## 2024-10-06 DIAGNOSIS — I5032 Chronic diastolic (congestive) heart failure: Secondary | ICD-10-CM | POA: Diagnosis present

## 2024-10-06 DIAGNOSIS — I447 Left bundle-branch block, unspecified: Secondary | ICD-10-CM | POA: Diagnosis present

## 2024-10-06 DIAGNOSIS — Y831 Surgical operation with implant of artificial internal device as the cause of abnormal reaction of the patient, or of later complication, without mention of misadventure at the time of the procedure: Secondary | ICD-10-CM | POA: Diagnosis present

## 2024-10-06 DIAGNOSIS — Z96611 Presence of right artificial shoulder joint: Secondary | ICD-10-CM | POA: Diagnosis present

## 2024-10-06 DIAGNOSIS — Z91030 Bee allergy status: Secondary | ICD-10-CM | POA: Diagnosis not present

## 2024-10-06 DIAGNOSIS — R0789 Other chest pain: Secondary | ICD-10-CM | POA: Diagnosis present

## 2024-10-06 DIAGNOSIS — I6523 Occlusion and stenosis of bilateral carotid arteries: Secondary | ICD-10-CM | POA: Diagnosis not present

## 2024-10-06 HISTORY — PX: CORONARY BALLOON ANGIOPLASTY: CATH118233

## 2024-10-06 HISTORY — PX: CORONARY IMAGING/OCT: CATH118326

## 2024-10-06 HISTORY — PX: LEFT HEART CATH AND CORONARY ANGIOGRAPHY: CATH118249

## 2024-10-06 LAB — LIPID PANEL
Cholesterol: 171 mg/dL (ref 0–200)
HDL: 95 mg/dL
LDL Cholesterol: 52 mg/dL (ref 0–99)
Total CHOL/HDL Ratio: 1.8 ratio
Triglycerides: 121 mg/dL
VLDL: 24 mg/dL (ref 0–40)

## 2024-10-06 LAB — BASIC METABOLIC PANEL WITH GFR
Anion gap: 13 (ref 5–15)
BUN: 10 mg/dL (ref 8–23)
CO2: 28 mmol/L (ref 22–32)
Calcium: 9.7 mg/dL (ref 8.9–10.3)
Chloride: 104 mmol/L (ref 98–111)
Creatinine, Ser: 0.8 mg/dL (ref 0.44–1.00)
GFR, Estimated: >60 mL/min
Glucose, Bld: 102 mg/dL — ABNORMAL HIGH (ref 70–99)
Potassium: 3.7 mmol/L (ref 3.5–5.1)
Sodium: 145 mmol/L (ref 135–145)

## 2024-10-06 LAB — CBC
HCT: 37.8 % (ref 36.0–46.0)
Hemoglobin: 12.2 g/dL (ref 12.0–15.0)
MCH: 28.9 pg (ref 26.0–34.0)
MCHC: 32.3 g/dL (ref 30.0–36.0)
MCV: 89.6 fL (ref 80.0–100.0)
Platelets: 306 K/uL (ref 150–400)
RBC: 4.22 MIL/uL (ref 3.87–5.11)
RDW: 12.3 % (ref 11.5–15.5)
WBC: 5.9 K/uL (ref 4.0–10.5)
nRBC: 0 % (ref 0.0–0.2)

## 2024-10-06 LAB — HEMOGLOBIN A1C
Hgb A1c MFr Bld: 5.5 % (ref 4.8–5.6)
Mean Plasma Glucose: 111.15 mg/dL

## 2024-10-06 LAB — TROPONIN T, HIGH SENSITIVITY: Troponin T High Sensitivity: 21 ng/L — ABNORMAL HIGH (ref 0–19)

## 2024-10-06 LAB — PROTIME-INR
INR: 1 (ref 0.8–1.2)
Prothrombin Time: 13.5 s (ref 11.4–15.2)

## 2024-10-06 LAB — APTT: aPTT: 34 s (ref 24–36)

## 2024-10-06 LAB — POCT ACTIVATED CLOTTING TIME: Activated Clotting Time: 343 s

## 2024-10-06 MED ORDER — ASPIRIN 81 MG PO CHEW: 81.0000 mg | CHEWABLE_TABLET | ORAL | Status: DC

## 2024-10-06 MED ORDER — VERAPAMIL HCL 2.5 MG/ML IV SOLN
INTRAVENOUS | Status: DC | PRN
  Administered 2024-10-06 (×2): 2.5 mg via INTRA_ARTERIAL

## 2024-10-06 MED ORDER — SODIUM CHLORIDE 0.9% FLUSH: 3.0000 mL | INTRAVENOUS | Status: DC | PRN

## 2024-10-06 MED ORDER — TICAGRELOR 90 MG PO TABS
ORAL_TABLET | ORAL | Status: AC
  Filled 2024-10-06: qty 2

## 2024-10-06 MED ORDER — FREE WATER
500.0000 mL | Freq: Once | Status: DC
  Filled 2024-10-06: qty 500

## 2024-10-06 MED ORDER — HEPARIN (PORCINE) IN NACL 1000-0.9 UT/500ML-% IV SOLN
INTRAVENOUS | Status: DC | PRN
  Administered 2024-10-06: 1000 mL

## 2024-10-06 MED ORDER — CARVEDILOL 3.125 MG PO TABS
6.2500 mg | ORAL_TABLET | Freq: Two times a day (BID) | ORAL | Status: DC
  Administered 2024-10-06 – 2024-10-07 (×3): 6.25 mg via ORAL
  Filled 2024-10-06 (×5): qty 2

## 2024-10-06 MED ORDER — IOHEXOL 300 MG/ML  SOLN
INTRAMUSCULAR | Status: DC | PRN
  Administered 2024-10-06: 93 mL

## 2024-10-06 MED ORDER — LIDOCAINE HCL 1 % IJ SOLN
INTRAMUSCULAR | Status: AC
  Filled 2024-10-06: qty 20

## 2024-10-06 MED ORDER — NITROGLYCERIN 1 MG/10 ML FOR IR/CATH LAB
INTRA_ARTERIAL | Status: DC | PRN
  Administered 2024-10-06: 200 ug via INTRACORONARY

## 2024-10-06 MED ORDER — SODIUM CHLORIDE 0.9 % IV SOLN: 250.0000 mL | INTRAVENOUS | Status: AC | PRN

## 2024-10-06 MED ORDER — ROSUVASTATIN CALCIUM 10 MG PO TABS
20.0000 mg | ORAL_TABLET | Freq: Every day | ORAL | Status: DC
  Administered 2024-10-07 – 2024-10-08 (×2): 20 mg via ORAL
  Filled 2024-10-06 (×2): qty 2
  Filled 2024-10-06: qty 1

## 2024-10-06 MED ORDER — FENTANYL CITRATE (PF) 100 MCG/2ML IJ SOLN
INTRAMUSCULAR | Status: AC
  Filled 2024-10-06: qty 2

## 2024-10-06 MED ORDER — FUROSEMIDE 10 MG/ML IJ SOLN
20.0000 mg | Freq: Once | INTRAMUSCULAR | Status: AC
  Administered 2024-10-06: 20 mg via INTRAVENOUS
  Filled 2024-10-06: qty 4

## 2024-10-06 MED ORDER — HEPARIN (PORCINE) 25000 UT/250ML-% IV SOLN
900.0000 [IU]/h | INTRAVENOUS | Status: DC
  Administered 2024-10-06: 900 [IU]/h via INTRAVENOUS
  Filled 2024-10-06: qty 250

## 2024-10-06 MED ORDER — TICAGRELOR 90 MG PO TABS
90.0000 mg | ORAL_TABLET | Freq: Two times a day (BID) | ORAL | Status: DC
  Administered 2024-10-06 – 2024-10-08 (×4): 90 mg via ORAL
  Filled 2024-10-06 (×4): qty 1

## 2024-10-06 MED ORDER — NITROGLYCERIN 1 MG/10 ML FOR IR/CATH LAB
INTRA_ARTERIAL | Status: AC
  Filled 2024-10-06: qty 10

## 2024-10-06 MED ORDER — MIDAZOLAM HCL (PF) 2 MG/2ML IJ SOLN
INTRAMUSCULAR | Status: DC | PRN
  Administered 2024-10-06: 1 mg via INTRAVENOUS

## 2024-10-06 MED ORDER — FUROSEMIDE 20 MG PO TABS
20.0000 mg | ORAL_TABLET | Freq: Every day | ORAL | Status: DC
  Administered 2024-10-06 – 2024-10-07 (×2): 20 mg via ORAL
  Filled 2024-10-06 (×2): qty 1

## 2024-10-06 MED ORDER — LIDOCAINE HCL (PF) 1 % IJ SOLN
INTRAMUSCULAR | Status: DC | PRN
  Administered 2024-10-06 (×2): 2 mL

## 2024-10-06 MED ORDER — HEPARIN SODIUM (PORCINE) 1000 UNIT/ML IJ SOLN
INTRAMUSCULAR | Status: AC
  Filled 2024-10-06: qty 10

## 2024-10-06 MED ORDER — VERAPAMIL HCL 2.5 MG/ML IV SOLN
INTRAVENOUS | Status: AC
  Filled 2024-10-06: qty 2

## 2024-10-06 MED ORDER — HEPARIN SODIUM (PORCINE) 1000 UNIT/ML IJ SOLN
INTRAMUSCULAR | Status: DC | PRN
  Administered 2024-10-06: 5000 [IU] via INTRAVENOUS
  Administered 2024-10-06: 4000 [IU] via INTRAVENOUS

## 2024-10-06 MED ORDER — SODIUM CHLORIDE 0.9% FLUSH
3.0000 mL | Freq: Two times a day (BID) | INTRAVENOUS | Status: DC
  Administered 2024-10-06 – 2024-10-08 (×4): 3 mL via INTRAVENOUS

## 2024-10-06 MED ORDER — MIDAZOLAM HCL 2 MG/2ML IJ SOLN
INTRAMUSCULAR | Status: AC
  Filled 2024-10-06: qty 2

## 2024-10-06 MED ORDER — FREE WATER: 500.0000 mL | Freq: Once | Status: DC

## 2024-10-06 MED ORDER — TICAGRELOR 90 MG PO TABS
ORAL_TABLET | ORAL | Status: DC | PRN
  Administered 2024-10-06: 180 mg via ORAL

## 2024-10-06 MED ORDER — HEPARIN BOLUS VIA INFUSION
4000.0000 [IU] | Freq: Once | INTRAVENOUS | Status: AC
  Administered 2024-10-06: 4000 [IU] via INTRAVENOUS
  Filled 2024-10-06: qty 4000

## 2024-10-06 MED ORDER — FENTANYL CITRATE (PF) 100 MCG/2ML IJ SOLN
INTRAMUSCULAR | Status: DC | PRN
  Administered 2024-10-06 (×2): 25 ug via INTRAVENOUS

## 2024-10-06 MED ORDER — LABETALOL HCL 5 MG/ML IV SOLN: 10.0000 mg | INTRAVENOUS | Status: AC | PRN

## 2024-10-06 NOTE — ED Notes (Signed)
 Ambulated to the bathroom at this time with minimal assistance

## 2024-10-06 NOTE — Progress Notes (Signed)
 Bedside handoff given to [facility]. Patient stable.

## 2024-10-06 NOTE — ED Notes (Signed)
 Report given to cath lab RN. Pt transported to cath lab at this time. All belongings sent with son per pt's request. Pt stable at time of departure.

## 2024-10-06 NOTE — ED Notes (Signed)
 Cardiology at bedside.

## 2024-10-06 NOTE — ED Notes (Signed)
 Pt called out for cp. States pain is a 7/10 with nausea but non radiating pain.

## 2024-10-06 NOTE — Progress Notes (Signed)
 PHARMACY - ANTICOAGULATION CONSULT NOTE  Pharmacy Consult for heparin  Indication: chest pain/ACS  Allergies[1]  Patient Measurements: Weight: 81.6 kg (180 lb)  Vital Signs: Temp: 97.8 F (36.6 C) (01/12 0528) Temp Source: Oral (01/12 0528) BP: 170/77 (01/12 0800) Pulse Rate: 64 (01/12 0800)  Labs: Recent Labs    10/05/24 1827 10/05/24 2218 10/06/24 0522  HGB 11.4*  --  12.2  HCT 36.2  --  37.8  PLT 315  --  306  APTT  --  34  --   LABPROT  --  13.5  --   INR  --  1.0  --   CREATININE 0.89  --  0.80    Estimated Creatinine Clearance: 62.1 mL/min (by C-G formula based on SCr of 0.8 mg/dL).   Medical History: Past Medical History:  Diagnosis Date   Anemia    Angina pectoris    Aortic atherosclerosis    Arthritis    Bilateral carotid artery disease 10/15/2019   a.) carotid doppler 10/15/2019: 50-69% BICA; b.) carotid doppler 04/06/2020: 50-69% RICA, 1-15% LICA; c.) carotid doppler 11/03/2020 and 04/29/2021: 50-69% RICA, 16-49% LICA; d.) carotid doppler 03/02/2022: 50-69% BICA; e.) carotid doppler 03/14/2023: 50-69% RICA, 16-49% LICA   Biliary dyskinesia    CAD (coronary artery disease) 06/14/2021   a.) LHC 11/04/2019: 50% mLAD - med mgmt; b.) LHC/PCI 06/14/2021: 40% p-mLAD, 80% mLAD (3.0 x 30 mm Onyx Frontier DES; c.) LHC for FFR study: 07/19/2021: 40% pLAD; RFR 0.92, FFR 0.89, CFR 4.8, IMR 18 --> no significant macro/microvascular disease   CKD (chronic kidney disease), stage III (HCC)    Complication of anesthesia    a.) PONV; b.) delayed emergence   Constipation    Diastolic dysfunction 10/15/2019   a.) TTE 10/15/2019: EF 50-55%, norm LAP, mild LA dil, mod MR, mild-mod TR, G1DD; b.) TTE 05/19/2021: EF 50-55%, mild basal sep hypertrophy, norm LAP, triv MR/TR, G1DD   GERD (gastroesophageal reflux disease)    Hepatitis B 1975   Hypercholesteremia    Hypertension    Inguinal hernia    Irritable bowel syndrome without diarrhea    LBBB (left bundle branch block)     Leukopenia    Long term current use of aspirin     PONV (postoperative nausea and vomiting)    [facility] spotted fever 04/01/2012   adm. to hospital   Syncope 10/17/2019   Thrombocytopenia    Transaminitis    Trochanteric bursitis, right hip    Vaginal Pap smear, abnormal    Varicose veins of leg with edema, bilateral    Weakness     Medications:  (Not in a hospital admission)   Assessment: Pharmacy consulted to dose heparin  in patient with NSTEMI/UA.  Patient is not on anticoagulation prior to admission.  CBC WNL Trop 21  Goal of Therapy:  Heparin  level 0.3-0.7 units/ml Monitor platelets by anticoagulation protocol: Yes   Plan:  Give 4000 units bolus x 1 Start heparin  infusion at 900 units/hr Check anti-Xa level in 8 hours and daily while on heparin  Continue to monitor H&H and platelets  Elspeth Sour, PharmD Clinical Pharmacist 10/06/2024 8:58 AM      [1]  Allergies Allergen Reactions   Ace Inhibitors Anaphylaxis    Angioedema.   Bee Venom Anaphylaxis and Other (See Comments)   Levofloxacin      Other Reaction(s): Not available  levofloxacin    Shellfish Allergy Hives, Rash and Dermatitis    splotches  Shellfish (substance)   Thorazine [Chlorpromazine] Anaphylaxis   Wasp Venom  Anaphylaxis   Levaquin  [Levofloxacin  In D5w] Nausea And Vomiting    Stated by patient she could not handle levaquin  even with antiemetics    Amlodipine      severe leg edema   Atropine Sulfate Other (See Comments)   Elemental Sulfur Itching        Glycopyrrolate  Other (See Comments)   Ramipril Other (See Comments)   Sulfa Antibiotics Other (See Comments)   Compazine [Prochlorperazine Edisylate] Anxiety   Latex Rash

## 2024-10-06 NOTE — Plan of Care (Signed)
 VSS. RA. Patient transferred from cath lab this afternoon. Family at bedside this shift.   Problem: Education: Goal: Understanding of CV disease, CV risk reduction, and recovery process will improve Outcome: Progressing Goal: Individualized Educational Video(s) Outcome: Progressing   Problem: Activity: Goal: Ability to return to baseline activity level will improve Outcome: Progressing   Problem: Cardiovascular: Goal: Ability to achieve and maintain adequate cardiovascular perfusion will improve Outcome: Progressing Goal: Vascular access site(s) Level 0-1 will be maintained Outcome: Progressing   Problem: Health Behavior/Discharge Planning: Goal: Ability to safely manage health-related needs after discharge will improve Outcome: Progressing   Problem: Education: Goal: Knowledge of General Education information will improve Description: Including pain rating scale, medication(s)/side effects and non-pharmacologic comfort measures Outcome: Progressing   Problem: Health Behavior/Discharge Planning: Goal: Ability to manage health-related needs will improve Outcome: Progressing   Problem: Clinical Measurements: Goal: Ability to maintain clinical measurements within normal limits will improve Outcome: Progressing Goal: Will remain free from infection Outcome: Progressing Goal: Diagnostic test results will improve Outcome: Progressing Goal: Respiratory complications will improve Outcome: Progressing Goal: Cardiovascular complication will be avoided Outcome: Progressing   Problem: Activity: Goal: Risk for activity intolerance will decrease Outcome: Progressing   Problem: Nutrition: Goal: Adequate nutrition will be maintained Outcome: Progressing   Problem: Coping: Goal: Level of anxiety will decrease Outcome: Progressing   Problem: Elimination: Goal: Will not experience complications related to bowel motility Outcome: Progressing Goal: Will not experience  complications related to urinary retention Outcome: Progressing   Problem: Pain Managment: Goal: General experience of comfort will improve and/or be controlled Outcome: Progressing   Problem: Safety: Goal: Ability to remain free from injury will improve Outcome: Progressing   Problem: Skin Integrity: Goal: Risk for impaired skin integrity will decrease Outcome: Progressing

## 2024-10-06 NOTE — Progress Notes (Addendum)
 " PROGRESS NOTE    Amanda Lambert  FMW:[id number] DOB: [birth year] DOA: 10/05/2024 PCP: [place], [place]  E, PA  Chief Complaint  Patient presents with   Chest Pain    Hospital Course:  Amanda Lambert is a 79 y.o. female with medical history significant of CAD, s/p of stent to LAD, dCHF, LBBB, HTN, HLD, CKD-3a, IBS, varicose vein, bilateral carotid artery stenosis, who presents with chest pain.  Patient reports having intermittent chest pain for the past several days. Admitted for unstable angina, seen by cardiology, underwent LHC 01/12.  Hospital course as below   Subjective: Unable to examine patient at the bedside, was in Cath Lab and postrecovery area Attempted to see patient multiple times during the day today  Objective: Vitals:   10/06/24 0800 10/06/24 0858 10/06/24 0952 10/06/24 1000  BP: (!) 170/77  (!) 158/79 (!) 158/71  Pulse: 64 68 61 63  Resp: 16 18  18   Temp:      TempSrc:      SpO2: 98% 100%  96%  Weight:       No intake or output data in the 24 hours ending 10/06/24 1015 Filed Weights   10/05/24 1822  Weight: 81.6 kg    Examination: Not examined  Assessment & Plan:  NSTEMI/unstable angina Chest pain and hx of CAD s/p DES LAD Presented with intermittent chest pain Seen by cardiology, appreciate recs Underwent LHC today 01/12 shows severe one-vessel CAD in mid LAD due to severe in-stent restenosis, successful balloon angioplasty If patient develops recurrent restenosis, recommend treatment with drug-coated balloon Asa, Brilinta , statin, Coreg , Imdur  prn Nitroglycerin  Echo pending  HTN (hypertension) Change propranolol  to Coreg , on Imdur  Hydralazine  as needed with holding parameters   Hypercholesteremia Crestor    Chronic diastolic CHF (congestive heart failure) 2D echo on 10/15/2019 showed EF of 50-55%. BNP elevated, s/p 1 dose IV Lasix  20 mg Continue home Lasix  20mg  daily  Chronic kidney disease, stage 3a Monitor Cr  Normocytic anemia Monitor  Hb  Overweight (BMI 25.0-29.9): Body weight 85.6 kg, BMI 29.95 Encourage losing weight Exercise healthy diet    DVT prophylaxis: IV Heparin    Code Status: Limited: Do not attempt resuscitation (DNR) -DNR-LIMITED -Do Not Intubate/DNI  Disposition:  Home  Consultants:  Treatment Team:  Consulting Physician: Darron Deatrice LABOR, MD  Procedures:  LHC 01/12  Antimicrobials:  Anti-infectives (From admission, onward)    None       Data Reviewed: I have personally reviewed following labs and imaging studies CBC: Recent Labs  Lab 10/02/24 0955 10/05/24 1827 10/06/24 0522  WBC 6.0 5.3 5.9  HGB 12.1 11.4* 12.2  HCT 38.1 36.2 37.8  MCV 91.6 92.6 89.6  PLT 307 315 306   Basic Metabolic Panel: Recent Labs  Lab 10/02/24 0955 10/05/24 1827 10/06/24 0522  NA 141 141 145  K 3.9 4.4 3.7  CL 103 105 104  CO2 29 26 28   GLUCOSE 94 105* 102*  BUN 13 15 10   CREATININE 0.84 0.89 0.80  CALCIUM  9.6 9.2 9.7   GFR: Estimated Creatinine Clearance: 62.1 mL/min (by C-G formula based on SCr of 0.8 mg/dL). Liver Function Tests: Recent Labs  Lab 10/02/24 0955  AST 20  ALT 9  ALKPHOS 102  BILITOT 0.6  PROT 7.1  ALBUMIN 4.2   CBG: No results for input(s): GLUCAP in the last 168 hours.  No results found for this or any previous visit (from the past 240 hours).   Radiology Studies: DG Chest 2 View Result  Date: 10/05/2024 EXAM: 2 VIEW(S) XRAY OF THE CHEST 10/05/2024 06:55:00 PM COMPARISON: X-ray 10/02/2024, CT chest 08/28/2016. CLINICAL HISTORY: CP. FINDINGS: LUNGS AND PLEURA: No focal pulmonary opacity. No pleural effusion. No pneumothorax. HEART AND MEDIASTINUM: Calcified aorta. Small hiatal hernia again noted. BONES AND SOFT TISSUES: Right shoulder prosthesis noted. Prior vertebral augmentation. IMPRESSION: 1. No acute cardiopulmonary abnormality. Electronically signed by: Morgane Naveau MD MD 10/05/2024 07:00 PM EST RP Workstation: [id number]    Scheduled Meds:  [START ON  10/07/2024] aspirin   81 mg Oral Pre-Cath   aspirin  EC  81 mg Oral Daily   cholecalciferol   1,000 Units Oral Daily   free water   500 mL Oral Once   furosemide   20 mg Oral Daily   gabapentin   300 mg Oral BID   isosorbide  mononitrate  120 mg Oral Daily   pantoprazole   40 mg Oral Daily   propranolol   20 mg Oral TID   rosuvastatin   10 mg Oral Daily   Continuous Infusions:  heparin  900 Units/hr (10/06/24 0959)     LOS: 0 days  MDM: Patient is high risk for one or more organ failure.  They necessitate ongoing hospitalization for continued IV therapies and subsequent lab monitoring. Total time spent interpreting labs and vitals, reviewing the medical record, coordinating care amongst consultants and care team members, directly assessing and discussing care with the patient and/or family:25  min Laree Lock, MD [facility]  To contact the attending physician between 7A-7P please use Epic Chat. To contact the covering physician during after hours 7P-7A, please review Amion.  10/06/2024, 10:15 AM   *This document has been created with the assistance of dictation software. Please excuse typographical errors. *   "

## 2024-10-06 NOTE — ED Notes (Signed)
 Pt stated the nitroglycerin  helped alleviate some of the chest pressure she was feeling. Bp was also noted to improve.

## 2024-10-06 NOTE — ED Notes (Addendum)
 Pt's iv was noted to be infiltrated after Morphine  administration.  Pt's forearm became red, itchy and  burning sensation. The IV was D/C and pharmacy was called with regards to same. They stated that none was to be done/given for the infiltration.  MD. Lawence was notified of same.

## 2024-10-06 NOTE — ED Notes (Signed)
 Pt ambulatory to restroom at this time.

## 2024-10-06 NOTE — Telephone Encounter (Signed)
 Pharmacy Patient Advocate Encounter  Insurance verification completed.    The patient is insured through [employer]. Patient has Medicare and is not eligible for a copay card, but may be able to apply for patient assistance or Medicare RX Payment Plan (Patient Must reach out to their plan, if eligible for payment plan), if available.    Ran test claim for Generic Brilinta  90mg  tablet and the current 30 day co-pay is $216.22 due to deductible.   This test claim was processed through [facility]- copay amounts may vary at other pharmacies due to pharmacy/plan contracts, or as the patient moves through the different stages of their insurance plan.

## 2024-10-06 NOTE — ED Notes (Signed)
 Pt stated the chest pain came back and the pain was 7/10. Pt's blood pressure was also noted to be elevated. At this time the pt was asked if she was okay with having some nitroglycerin  for her chest pain and bp and she agreed to same.

## 2024-10-06 NOTE — ED Notes (Signed)
 Pt called out for chest pain. States pain is a 7/10 to left side of her chest non radiating at this this time.

## 2024-10-06 NOTE — Consult Note (Signed)
 "  Cardiology Consultation   Patient ID: ESRA FRANKOWSKI MRN: [id number]; DOB: 06/16/1946  Admit date: 10/05/2024 Date of Consult: 10/06/2024  PCP:  Cleotilde, [place]  E, PA   [facility] Providers Cardiologist:  Gordy Bergamo, MD      Patient Profile: [place] Nolton Juliana is a 79 y.o. female with a hx of LBBB, hypertension, chronic kidney disease stage III, bilateral carotid stenosis, CAD status post LAD stenting 06/14/2021 with chronic angina being seen 10/06/2024 for the evaluation of chest pain at the request of Dr. Jerelene.  History of Present Illness: Ms. Widdowson is followed by Dr. Bergamo for the above cardiac issues.  Echocardiogram in 2021 showed EF 50 to 55%, moderate concentric hypertrophy of the LV, grade 1 diastolic dysfunction, moderate MR, mild to moderate TR.  Has a history of stenting to the LAD 06/14/2021.  Repeat cardiac cath 07/19/2021 revealed patent stent but had microvascular disease by CFR/IMR.  Echocardiogram in 2022 showed EF 50 to 55%, mild basal septal hypertrophy, grade 1 diastolic dysfunction, no significant valvular heart disease.  Most recent ultrasound of the carotids showed right ICA 40-59 stenosis, left ICA 1-39 stenosis.  Patient was last seen 03/06/2024 reporting occasional angina.  Patient went to the ER 10/02/2024 with chest pain.  Blood pressure was 170/95.  She did run out of her propranolol  a few days prior.  High-sensitivity troponin 24, 24.  Otherwise workup was negative.  Admission was recommended, but patient wanted to go home.  The patient presented to the ER on 10/05/2024 with chest pain.  Patient reports chest pain for the last 3 days radiating to the left arm.  She reports chest pain eventually lasted all night. She took NTG x 4 with improvement. She had one episode of SOB. No diaphoresis or vomiting.    In the ER blood pressure 162/99, 194/95.  Heart rate 73, O2 normal.  Chest x-ray nonacute.  Labs showed high-sensitivity troponin 20> 19> 21, WBC 5.3, serum  creatinine 0.89, BUN 15, potassium 4.4, hemoglobin 11.4.  EKG shows normal sinus rhythm, 60 bpm, left bundle branch block, no significant changes. The patient was admitted for further work-up.    Past Medical History:  Diagnosis Date   Anemia    Angina pectoris    Aortic atherosclerosis    Arthritis    Bilateral carotid artery disease 10/15/2019   a.) carotid doppler 10/15/2019: 50-69% BICA; b.) carotid doppler 04/06/2020: 50-69% RICA, 1-15% LICA; c.) carotid doppler 11/03/2020 and 04/29/2021: 50-69% RICA, 16-49% LICA; d.) carotid doppler 03/02/2022: 50-69% BICA; e.) carotid doppler 03/14/2023: 50-69% RICA, 16-49% LICA   Biliary dyskinesia    CAD (coronary artery disease) 06/14/2021   a.) LHC 11/04/2019: 50% mLAD - med mgmt; b.) LHC/PCI 06/14/2021: 40% p-mLAD, 80% mLAD (3.0 x 30 mm Onyx Frontier DES; c.) LHC for FFR study: 07/19/2021: 40% pLAD; RFR 0.92, FFR 0.89, CFR 4.8, IMR 18 --> no significant macro/microvascular disease   CKD (chronic kidney disease), stage III (HCC)    Complication of anesthesia    a.) PONV; b.) delayed emergence   Constipation    Diastolic dysfunction 10/15/2019   a.) TTE 10/15/2019: EF 50-55%, norm LAP, mild LA dil, mod MR, mild-mod TR, G1DD; b.) TTE 05/19/2021: EF 50-55%, mild basal sep hypertrophy, norm LAP, triv MR/TR, G1DD   GERD (gastroesophageal reflux disease)    Hepatitis B 1975   Hypercholesteremia    Hypertension    Inguinal hernia    Irritable bowel syndrome without diarrhea    LBBB (left  bundle branch block)    Leukopenia    Long term current use of aspirin     PONV (postoperative nausea and vomiting)    [facility] spotted fever 04/01/2012   adm. to hospital   Syncope 10/17/2019   Thrombocytopenia    Transaminitis    Trochanteric bursitis, right hip    Vaginal Pap smear, abnormal    Varicose veins of leg with edema, bilateral    Weakness     Past Surgical History:  Procedure Laterality Date   APPENDECTOMY  09/25/1961    CHOLECYSTECTOMY  04/26/2012   Procedure: LAPAROSCOPIC CHOLECYSTECTOMY WITH INTRAOPERATIVE CHOLANGIOGRAM;  Surgeon: Deward GORMAN Curvin DOUGLAS, MD;  Location: [facility] ORS;  Service: General;  Laterality: N/A;   COLONOSCOPY     CORONARY ANGIOGRAPHY N/A 07/19/2021   Procedure: CORONARY ANGIOGRAPHY (CATH LAB);  Surgeon: Ladona Heinz, MD;  Location[facility] INVASIVE CV LAB;  Service: Cardiovascular;  Laterality: N/A;   CORONARY IMAGING/OCT N/A 06/14/2021   Procedure: INTRAVASCULAR IMAGING/OCT;  Surgeon: Ladona Heinz, MD;  Location: [facility] INVASIVE CV LAB;  Service: Cardiovascular;  Laterality: N/A;   CORONARY PRESSURE/FFR STUDY N/A 07/19/2021   Procedure: INTRAVASCULAR PRESSURE WIRE/FFR STUDY;  Surgeon: Ladona Heinz, MD;  Location: [facility] INVASIVE CV LAB;  Service: Cardiovascular;  Laterality: N/A;   CORONARY STENT INTERVENTION N/A 06/14/2021   Procedure: CORONARY STENT INTERVENTION;  Surgeon: Ladona Heinz, MD;  Location: [facility] INVASIVE CV LAB;  Service: Cardiovascular;  Laterality: N/A;   DILATION AND CURETTAGE OF UTERUS  09/25/1980   for miscarriage   DILATION AND CURETTAGE, DIAGNOSTIC / THERAPEUTIC  09/25/1970   INSERTION OF MESH Right 05/03/2023   Procedure: INSERTION OF MESH;  Surgeon: Tye Millet, DO;  Location: [facility] ORS;  Service: General;  Laterality: Right;   IR KYPHO EA ADDL LEVEL THORACIC OR LUMBAR  09/05/2023   IR KYPHO LUMBAR INC FX REDUCE BONE BX UNI/BIL CANNULATION INC/IMAGING  09/05/2023   IR KYPHO THORACIC WITH BONE BIOPSY  09/05/2023   LEFT HEART CATH AND CORONARY ANGIOGRAPHY N/A 11/04/2019   Procedure: LEFT HEART CATH AND CORONARY ANGIOGRAPHY;  Surgeon: Ladona Heinz, MD;  Location: [facility] INVASIVE CV LAB;  Service: Cardiovascular;  Laterality: N/A;   LEFT HEART CATH AND CORONARY ANGIOGRAPHY N/A 06/14/2021   Procedure: LEFT HEART CATH AND CORONARY ANGIOGRAPHY;  Surgeon: Ladona Heinz, MD;  Location: [facility] INVASIVE CV LAB;  Service: Cardiovascular;  Laterality: N/A;   TONSILLECTOMY  1964  - approximate   TOTAL SHOULDER  ARTHROPLASTY Right 01/28/2024   WRIST FRACTURE SURGERY  09/25/2006   right     Home Medications:  Prior to Admission medications  Medication Sig Start Date End Date Taking? Authorizing Provider  aspirin  EC 81 MG tablet Take 81 mg by mouth daily. Swallow whole.   Yes [provider]  cyclobenzaprine  (FLEXERIL ) 10 MG tablet TAKE 1 TABLET BY MOUTH THREE TIMES A DAY AS NEEDED FOR MUSCLE SPASMS 03/23/24  Yes Gregory Edsel Ruth, PA  EPINEPHrine  0.3 mg/0.3 mL IJ SOAJ injection Inject 0.3 mg into the muscle once as needed for anaphylaxis.   Yes [provider]  esomeprazole (NEXIUM) 40 MG capsule Take 40 mg by mouth in the morning.   Yes [provider]  furosemide  (LASIX ) 20 MG tablet Take 1 tablet (20 mg total) by mouth daily as needed. 10/30/23  Yes Ladona Heinz, MD  gabapentin  (NEURONTIN ) 300 MG capsule Take 300 mg by mouth 2 (two) times daily. 02/14/21  Yes [provider]  isosorbide  mononitrate (IMDUR ) 120 MG 24 hr tablet TAKE  1 TABLET BY MOUTH EVERY DAY 03/25/24  Yes Jerilynn Lamarr HERO, NP  ondansetron  (ZOFRAN -ODT) 4 MG disintegrating tablet Take 1 tablet (4 mg total) by mouth every 8 (eight) hours as needed for nausea or vomiting. 01/29/24  Yes Veta Palma, PA-C  propranolol  (INDERAL ) 20 MG tablet Take 1 tablet (20 mg total) by mouth 3 (three) times daily. 09/24/24  Yes Ladona Heinz, MD  rosuvastatin  (CRESTOR ) 10 MG tablet TAKE 1 TABLET BY MOUTH EVERY DAY 09/02/24  Yes Ladona Heinz, MD  VITAMIN D  PO Take 1 capsule by mouth every other day. In the morning   Yes [provider]  Vitamin D , Ergocalciferol , (DRISDOL) 1.25 MG (50000 UNIT) CAPS capsule Take 1 capsule by mouth every 7 (seven) days.   Yes [provider]  Abaloparatide  3120 MCG/1.56ML SOPN Inject 3,120 mcg into the skin every 30 (thirty) days.    [provider]  calcitonin, salmon, (MIACALCIN /FORTICAL) 200 UNIT/ACT nasal spray Place 1 spray into alternate nostrils daily. Patient  not taking: Reported on 10/05/2024 09/03/23   Barbarann Nest, MD  estradiol  (ESTRACE ) 0.1 MG/GM vaginal cream Place 1 g vaginally 3 (three) times a week. Patient not taking: No sig reported 07/02/23   Cleatus Moccasin, MD  lidocaine  (LIDODERM ) 5 % Place 1 patch onto the skin daily. Remove & Discard patch within 12 hours or as directed by MD Patient not taking: Reported on 10/02/2024 09/02/23   Barbarann Nest, MD  magnesium  30 MG tablet Take 30 mg by mouth 2 (two) times daily.    [provider]  MYRBETRIQ 25 MG TB24 tablet Take 25 mg by mouth daily. Patient not taking: Reported on 10/05/2024 07/25/24   [provider]  nitroGLYCERIN  (NITROSTAT ) 0.4 MG SL tablet Place 1 tablet (0.4 mg total) under the tongue every 5 (five) minutes as needed for up to 25 days for chest pain. 08/03/23 10/02/24  Jerilynn Lamarr HERO, NP  potassium chloride  SA (KLOR-CON  M20) 20 MEQ tablet TAKE 1 TABLET BY MOUTH EVERY DAY Patient not taking: No sig reported 10/30/23   Ladona Heinz, MD  Probiotic Product (ALIGN PO) Take by mouth. Patient not taking: Reported on 10/05/2024    [provider]  traMADol (ULTRAM) 50 MG tablet Take 50 mg by mouth every 6 (six) hours as needed. Patient not taking: Reported on 10/02/2024 02/13/24   [provider]    Scheduled Meds:  aspirin  EC  81 mg Oral Daily   cholecalciferol   1,000 Units Oral Daily   enoxaparin  (LOVENOX ) injection  40 mg Subcutaneous Q24H   furosemide   20 mg Oral Daily   gabapentin   300 mg Oral BID   isosorbide  mononitrate  120 mg Oral Daily   pantoprazole   40 mg Oral Daily   propranolol   20 mg Oral TID   rosuvastatin   10 mg Oral Daily   Continuous Infusions:  PRN Meds: acetaminophen , hydrALAZINE , morphine  injection, nitroGLYCERIN , ondansetron  (ZOFRAN ) IV, oxyCODONE -acetaminophen   Allergies:   Allergies[1]  Social History:   Social History   Socioeconomic History   Marital status: Widowed    Spouse name: Not on file   Number of  children: 1   Years of education: Not on file   Highest education level: Not on file  Occupational History   Not on file  Tobacco Use   Smoking status: Never   Smokeless tobacco: Never  Vaping Use   Vaping status: Never Used  Substance and Sexual Activity   Alcohol use: No   Drug use: No  Sexual activity: Not Currently  Other Topics Concern   Not on file  Social History Narrative   Lives alone. Has small dog as pet.   Social Drivers of Health   Tobacco Use: Low Risk (10/05/2024)   Patient History    Smoking Tobacco Use: Never    Smokeless Tobacco Use: Never    Passive Exposure: Not on file  Financial Resource Strain: Not on file  Food Insecurity: No Food Insecurity (08/31/2023)   Hunger Vital Sign    Worried About Running Out of Food in the Last Year: Never true    Ran Out of Food in the Last Year: Never true  Transportation Needs: No Transportation Needs (08/31/2023)   PRAPARE - Administrator, Civil Service (Medical): No    Lack of Transportation (Non-Medical): No  Physical Activity: Not on file  Stress: Not on file  Social Connections: Not on file  Intimate Partner Violence: Not At Risk (08/31/2023)   Humiliation, Afraid, Rape, and Kick questionnaire    Fear of Current or Ex-Partner: No    Emotionally Abused: No    Physically Abused: No    Sexually Abused: No  Depression (PHQ2-9): Not on file  Alcohol Screen: Not on file  Housing: Low Risk (08/31/2023)   Housing    Last Housing Risk Score: 0  Utilities: Not At Risk (08/31/2023)   AHC Utilities    Threatened with loss of utilities: No  Health Literacy: Not on file    Family History:    Family History  Problem Relation Age of Onset   Cardiomyopathy Mother    Coronary artery disease Father    Hypertension Brother    Prostate cancer Brother      ROS:  Please see the history of present illness.   All other ROS reviewed and negative.     Physical Exam/Data: Vitals:   10/06/24 0230 10/06/24  0528 10/06/24 0600 10/06/24 0740  BP: (!) 147/66 (!) 157/82 (!) 179/83 (!) 174/86  Pulse: 63 68 64 69  Resp: 14  16 17   Temp:  97.8 F (36.6 C)    TempSrc:  Oral    SpO2: 94% 94% 98% 96%  Weight:       No intake or output data in the 24 hours ending 10/06/24 0748    10/05/2024    6:22 PM 10/02/2024    9:53 AM 03/06/2024    9:32 AM  Last 3 Weights  Weight (lbs) 180 lb 180 lb 167 lb  Weight (kg) 81.647 kg 81.647 kg 75.751 kg     Body mass index is 29.95 kg/m.  General:  Well nourished, well developed, in no acute distress HEENT: normal Neck: no JVD Vascular: No carotid bruits; Distal pulses 2+ bilaterally Cardiac:  normal S1, S2; RRR; no murmur  Lungs:  clear to auscultation bilaterally, no wheezing, rhonchi or rales  Abd: soft, nontender, no hepatomegaly  Ext: no edema Musculoskeletal:  No deformities, BUE and BLE strength normal and equal Skin: warm and dry  Neuro:  CNs 2-12 intact, no focal abnormalities noted Psych:  Normal affect   EKG:  The EKG was personally reviewed and demonstrates:  NSR LBBB, 68bpm, no changes, LAD Telemetry:  Telemetry was personally reviewed and demonstrates:  NSR, HR 60s, PVCs  Relevant CV Studies:  FFR study coronary angiogram 07/19/21 Left Heart Catheterization 07/19/21: LM: Smooth and normal. CX: Smooth and normal. LAD: Large vessel, gives origin to large D1.  Just after the origin of large D1 and  a large septal perforator, there is a stent that was previously placed 06/14/2021, 3.0 x 30 mm Onyx is widely patent.  Prior to the stent, the proximal segment at the bifurcation of D1 and large septal perforator, there is a 30 40% stenosis. RFR was 0.92, FFR 0.89, CFR 4.8, IMR 18.  Findings do not suggest macrovascular or microvascular disease. RCA: Not studied, previously normal.   Impression: Patient symptoms of chest pain that is relieved with nitroglycerin , does not appear to be from cardiac etiology.  Will recommend performing a routine  treadmill exercise stress test to see if there is any inducible ST segment changes.  Otherwise I have reassured the patient.  50 mL contrast utilized.    LHC 05/2021    Prox LAD to Mid LAD lesion is 40% stenosed.   Mid LAD lesion is 80% stenosed.   A drug-eluting stent was successfully placed using a STENT ONYX FRONTIER 3.0X30.   Post intervention, there is a 0% residual stenosis.   LV end diastolic pressure is normal.   Coronary angiography 06/14/2021: LV: 130/3, EDP 9 mmHg.  Ao 134/61, mean 91 mmHg.  There was no pressure gradient across the aortic valve. RCA: Large-caliber vessel.  Very mild luminal irregularity. Left main: Large vessel.  No significant disease. Circumflex: Large caliber vessel.  Gives origin to a small OM1 and continues as a large OM 2, no significant disease. LAD: Large vessel proximally, tapers off after the origin of D1.  There is a hazy 60 to 70% stenosis in the mid LAD extending all the way to the proximal LAD.  At the tightest segment appeared to be much more significant. Coronary intervention: OCT guided intervention.  The tightest segment in the mid LAD was 2.1 mm.  Distal reference was 6.4 mm.  Post PCI, the tightest segment measured 6.5 mm, proximal segment of the mid LAD stent measured 7.72 mm.  Overall stenosis reduced from 80% to 0% with TIMI-3 TIMI-3 flow.  140 mL contrast utilized.  Echo 04/2021 Normal LV systolic function with visual EF 50-55%. Left ventricle cavity  is normal in size. Mild basal septal  hypertrophy. Poor endocardial  visualization to accurately comment on regional wall motion; grossly  normal global wall motion. Grade I (impaired) diastolic dysfunction,  normal LAP.  No significant valvular heart disease.  Compared to study 10/15/2019 Moderate MR and mild/moderate TR is now trace  otherwise no significant change.   Echo 09/2019 Left ventricle cavity is normal in size. Moderate concentric hypertrophy  of the left ventricle. Normal  global wall motion. Normal LV systolic  function with visual EF 50-55%. Doppler evidence of grade I (impaired)  diastolic dysfunction, normal LAP. Left atrial cavity is mildly dilated.  Moderate (Grade II) mitral regurgitation.  Structurally normal tricuspid valve.  Mild to moderate tricuspid  regurgitation.  No evidence of pulmonary hypertension.    Laboratory Data: High Sensitivity Troponin:  No results for input(s): TROPONINIHS in the last 720 hours.  Recent Labs  Lab 10/02/24 0955 10/02/24 1223 10/05/24 1827 10/05/24 2218 10/06/24 0522  TRNPT 24* 24* 20* 19 21*      Chemistry Recent Labs  Lab 10/02/24 0955 10/05/24 1827 10/06/24 0522  NA 141 141 145  K 3.9 4.4 3.7  CL 103 105 104  CO2 29 26 28   GLUCOSE 94 105* 102*  BUN 13 15 10   CREATININE 0.84 0.89 0.80  CALCIUM  9.6 9.2 9.7  GFRNONAA >60 >60 >60  ANIONGAP 10 9 13     Recent  Labs  Lab 10/02/24 0955  PROT 7.1  ALBUMIN 4.2  AST 20  ALT 9  ALKPHOS 102  BILITOT 0.6   Lipids  Recent Labs  Lab 10/06/24 0522  CHOL 171  TRIG 121  HDL 95  LDLCALC 52  CHOLHDL 1.8    Hematology Recent Labs  Lab 10/02/24 0955 10/05/24 1827 10/06/24 0522  WBC 6.0 5.3 5.9  RBC 4.16 3.91 4.22  HGB 12.1 11.4* 12.2  HCT 38.1 36.2 37.8  MCV 91.6 92.6 89.6  MCH 29.1 29.2 28.9  MCHC 31.8 31.5 32.3  RDW 12.4 12.5 12.3  PLT 307 315 306   Thyroid  No results for input(s): TSH, FREET4 in the last 168 hours.  BNP Recent Labs  Lab 10/05/24 2218  PROBNP 1,421.0*    DDimer No results for input(s): DDIMER in the last 168 hours.  Radiology/Studies:  DG Chest 2 View Result Date: 10/05/2024 EXAM: 2 VIEW(S) XRAY OF THE CHEST 10/05/2024 06:55:00 PM COMPARISON: X-ray 10/02/2024, CT chest 08/28/2016. CLINICAL HISTORY: CP. FINDINGS: LUNGS AND PLEURA: No focal pulmonary opacity. No pleural effusion. No pneumothorax. HEART AND MEDIASTINUM: Calcified aorta. Small hiatal hernia again noted. BONES AND SOFT TISSUES: Right shoulder  prosthesis noted. Prior vertebral augmentation. IMPRESSION: 1. No acute cardiopulmonary abnormality. Electronically signed by: Morgane Naveau MD MD 10/05/2024 07:00 PM EST RP Workstation: [id number]   DG Chest 2 View Result Date: 10/02/2024 CLINICAL DATA:  Chest pain since last night. EXAM: CHEST - 2 VIEW COMPARISON:  Chest x-ray 02/11/2024 and lumbar spine 01/22/2024 FINDINGS: Lungs are adequately inflated without focal airspace consolidation or effusion. Cardiomediastinal silhouette and remainder of the exam is unchanged. EKG lead projects over the right upper lung. IMPRESSION: No active cardiopulmonary disease. Electronically Signed   By: Toribio Agreste M.D.   On: 10/02/2024 10:25     Assessment and Plan:  NSTEMI/UA CAD s/p DES mid LAD 2022 - patient presents with worsening chest pain and mildly elevated troponin. She has h/o chronic angina on Imdur  120mg  daily - HS trop up to 21 - she has dill chest pain after morphine  and SL NTG - start IV heparin  - echo ordered - continue ASA, Crestor  10mg  daily, Imdur  120mg  daily, propranolol  20mg  TID - she is NPO for LHC  Risks and benefits of cardiac catheterization have been discussed with the patient.  These include bleeding, infection, kidney damage, stroke, heart attack, death.  The patient understands these risks and is willing to proceed.   HFpEF - PTA lasix  20mg  daily continued on admission - proBNP 1421 - trace edema on exam - repeat echo - I will given IV lasix  20mg  once  HTN - pressures elevated since worsening of chest pain - she has not had meds this AM - Imdur  120mg  daily, propranolol  20mg  TID - continue to monitor  HLD - LDL 52 - Crestor  10mg  daily   For questions or updates, please contact [facility] Please consult [email or web address] for contact info under      Signed, Fadil Macmaster VEAR Fishman, PA-C  10/06/2024 7:48 AM     [1]  Allergies Allergen Reactions   Ace Inhibitors Anaphylaxis    Angioedema.   Bee  Venom Anaphylaxis and Other (See Comments)   Levofloxacin      Other Reaction(s): Not available  levofloxacin    Shellfish Allergy Hives, Rash and Dermatitis    splotches  Shellfish (substance)   Thorazine [Chlorpromazine] Anaphylaxis   Wasp Venom Anaphylaxis   Levaquin  [Levofloxacin  In D5w] Nausea And Vomiting    Stated by  patient she could not handle levaquin  even with antiemetics    Amlodipine      severe leg edema   Atropine Sulfate Other (See Comments)   Elemental Sulfur Itching        Glycopyrrolate  Other (See Comments)   Ramipril Other (See Comments)   Sulfa Antibiotics Other (See Comments)   Compazine [Prochlorperazine Edisylate] Anxiety   Latex Rash   "

## 2024-10-07 ENCOUNTER — Other Ambulatory Visit: Payer: Self-pay

## 2024-10-07 DIAGNOSIS — I200 Unstable angina: Secondary | ICD-10-CM

## 2024-10-07 LAB — GLUCOSE, CAPILLARY: Glucose-Capillary: 159 mg/dL — ABNORMAL HIGH (ref 70–99)

## 2024-10-07 LAB — CBC
HCT: 35.8 % — ABNORMAL LOW (ref 36.0–46.0)
Hemoglobin: 11.5 g/dL — ABNORMAL LOW (ref 12.0–15.0)
MCH: 28.8 pg (ref 26.0–34.0)
MCHC: 32.1 g/dL (ref 30.0–36.0)
MCV: 89.7 fL (ref 80.0–100.0)
Platelets: 267 K/uL (ref 150–400)
RBC: 3.99 MIL/uL (ref 3.87–5.11)
RDW: 12.1 % (ref 11.5–15.5)
WBC: 5.8 K/uL (ref 4.0–10.5)
nRBC: 0 % (ref 0.0–0.2)

## 2024-10-07 LAB — BASIC METABOLIC PANEL WITH GFR
Anion gap: 11 (ref 5–15)
BUN: 15 mg/dL (ref 8–23)
CO2: 28 mmol/L (ref 22–32)
Calcium: 9.5 mg/dL (ref 8.9–10.3)
Chloride: 100 mmol/L (ref 98–111)
Creatinine, Ser: 0.96 mg/dL (ref 0.44–1.00)
GFR, Estimated: >60 mL/min
Glucose, Bld: 105 mg/dL — ABNORMAL HIGH (ref 70–99)
Potassium: 3.3 mmol/L — ABNORMAL LOW (ref 3.5–5.1)
Sodium: 139 mmol/L (ref 135–145)

## 2024-10-07 LAB — ECHOCARDIOGRAM COMPLETE
AR max vel: 1.72 cm2
AV Area VTI: 1.84 cm2
AV Area mean vel: 1.63 cm2
AV Mean grad: 5.6 mmHg
AV Peak grad: 10.7 mmHg
Ao pk vel: 1.64 m/s
Area-P 1/2: 2.93 cm2
Height: 65 in
S' Lateral: 2.8 cm
Weight: 2857.16 [oz_av]

## 2024-10-07 LAB — MAGNESIUM: Magnesium: 1.6 mg/dL — ABNORMAL LOW (ref 1.7–2.4)

## 2024-10-07 MED ORDER — MAGNESIUM SULFATE 2 GM/50ML IV SOLN
2.0000 g | Freq: Once | INTRAVENOUS | Status: AC
  Administered 2024-10-07: 2 g via INTRAVENOUS
  Filled 2024-10-07: qty 50

## 2024-10-07 MED ORDER — RANOLAZINE ER 500 MG PO TB12
500.0000 mg | ORAL_TABLET | Freq: Two times a day (BID) | ORAL | Status: DC
  Administered 2024-10-07 – 2024-10-08 (×3): 500 mg via ORAL
  Filled 2024-10-07 (×3): qty 1

## 2024-10-07 MED ORDER — ENOXAPARIN SODIUM 40 MG/0.4ML IJ SOSY
40.0000 mg | PREFILLED_SYRINGE | INTRAMUSCULAR | Status: DC
  Administered 2024-10-08: 40 mg via SUBCUTANEOUS
  Filled 2024-10-07: qty 0.4

## 2024-10-07 MED ORDER — POTASSIUM CHLORIDE CRYS ER 20 MEQ PO TBCR
80.0000 meq | EXTENDED_RELEASE_TABLET | Freq: Once | ORAL | Status: AC
  Administered 2024-10-07: 80 meq via ORAL
  Filled 2024-10-07: qty 4

## 2024-10-07 NOTE — Progress Notes (Signed)
 "  Progress Note  Patient Name: Amanda Lambert Date of Encounter: 10/07/2024 [facility] Cardiologist: Gordy Bergamo, MD   Interval Summary    Patient denies chest pain or SOB. She ambulated to the bathroom without issues. Cath sites are stable. Likely d/c today.  Vital Signs Vitals:   10/06/24 2045 10/07/24 0027 10/07/24 0423 10/07/24 0500  BP: (!) 164/73 (!) 151/75 (!) 142/82   Pulse: 71 73 86   Resp: 20 20 19    Temp: 98.6 F (37 C) 98 F (36.7 C) (!) 97.4 F (36.3 C)   TempSrc:      SpO2: 97% 98% 97%   Weight:    80.5 kg  Height:        Intake/Output Summary (Last 24 hours) at 10/07/2024 0857 Last data filed at 10/06/2024 2200 Gross per 24 hour  Intake 47.41 ml  Output --  Net 47.41 ml      10/07/2024    5:00 AM 10/06/2024    6:47 PM 10/05/2024    6:22 PM  Last 3 Weights  Weight (lbs) 177 lb 7.5 oz 178 lb 9.2 oz 180 lb  Weight (kg) 80.5 kg 81 kg 81.647 kg      Telemetry/ECG  NSR HR 60s, PVCs - Personally Reviewed  Physical Exam  GEN: No acute distress.   Neck: No JVD Cardiac: RRR, no murmurs, rubs, or gallops.  Respiratory: Clear to auscultation bilaterally. GI: Soft, nontender, non-distended  MS: No edema  CV studies:  LHC 09/2024   Mid LAD-1 lesion is 10% stenosed.   Dist LAD lesion is 30% stenosed.   Mid LAD-2 lesion is 90% stenosed.   2nd Diag lesion is 30% stenosed.   Prox LAD to Mid LAD lesion is 30% stenosed.   Prox RCA lesion is 20% stenosed.   Balloon angioplasty was performed using a BALLOON TREK RX 2.5X12.   Post intervention, there is a 0% residual stenosis.   The left ventricular systolic function is normal.   LV end diastolic pressure is normal.   The left ventricular ejection fraction is 55-65% by visual estimate.   In the absence of any other complications or medical issues, we expect the patient to be ready for discharge from an interventional cardiology perspective on 10/07/2024.   Recommend uninterrupted dual antiplatelet  therapy with Aspirin  81mg  daily and Ticagrelor  90mg  twice daily for a minimum of 12 months (ACS-Class I recommendation).   1.  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  No other obstructive disease. 2.  Normal LV systolic function normal left ventricular end-diastolic pressure. 3.  Successful balloon angioplasty to the mid LAD for in-stent restenosis.  OCT was performed before and showed severe neointimal hyperplasia with no evidence of mechanical issues or stent underexpansion.  The restenosis did not extend outside the stent distally.   Recommendations: If she develops recurrent restenosis, recommend treatment with a drug-coated balloon. I switch propranolol  to carvedilol  for better blood pressure control.   Echo 09/2024 1. Left ventricular ejection fraction, by estimation, is 55 to 60%. The  left ventricle has normal function. The left ventricle has no regional  wall motion abnormalities. There is mild left ventricular hypertrophy.  Left ventricular diastolic parameters  are consistent with Grade I diastolic dysfunction (impaired relaxation).   2. Right ventricular systolic function is normal. The right ventricular  size is normal.   3. The mitral valve is degenerative. Mild mitral valve regurgitation. No  evidence of mitral  stenosis.   4. The aortic valve was not well visualized. There is mild calcification  of the aortic valve. Aortic valve regurgitation is not visualized. Aortic  valve sclerosis/calcification is present, without any evidence of aortic   Patient Profile: Amanda Lambert is a 79 y.o. female with a hx of LBBB, hypertension, chronic kidney disease stage III, bilateral carotid stenosis, CAD status post LAD stenting 06/14/2021 with chronic angina being seen 10/06/2024 for the evaluation of chest pain  Assessment & Plan   NSTEMI/UA CAD s/p DES mid LAD 2022 - patient presented with worsening chest pain and mildly elevated  troponin. She has h/o chronic angina on Imdur  120mg  daily - HS trop up to 21 - echo showed normal LVEF - LHC showed severe 1V CAD due to severe ISR in the mid LAD, normal LVSF and normal LVEDP treated with successful balloon angioplasty to the mid LAD for ISR.  - started on DAPT with ASA and Brilinta  x 12 months - continue ASA, Crestor  10mg  daily, Imdur  120mg  daily  - propranolo switched to Coreg  - cath sites stable  HFpEF - PTA lasix  20mg  daily continued on admission - proBNP 1421 - repeat echo showed normal LVEF and G1DD - s/p IV lasix  20mg  once - appears euvolemic - will replete potassium   HTN - Imdur  120mg  daily,  - propranolol  switched to Coreg  - continue to monitor - she has not had her AM meds yet   HLD - LDL 52 - Crestor  10mg  daily    For questions or updates, please contact [facility] Please consult [email or web address] for contact info under         Signed, Peggie Hornak VEAR Fishman, PA-C   "

## 2024-10-07 NOTE — Progress Notes (Signed)
 " PROGRESS NOTE    Amanda Lambert  FMW:[id number] DOB: 1945/10/15 DOA: 10/05/2024 PCP: Cleotilde, Virginia  E, PA  Chief Complaint  Patient presents with   Chest Pain    Hospital Course:  Amanda Lambert is a 79 y.o. female with medical history significant of CAD, s/p of stent to LAD, dCHF, LBBB, HTN, HLD, CKD-3a, IBS, varicose vein, bilateral carotid artery stenosis, who presents with chest pain.  Patient reports having intermittent chest pain for the past several days. Admitted for unstable angina, seen by cardiology, underwent LHC 01/12.  Hospital course as below   Subjective: Patient was examined at the bedside, new to me today. Son also present at the bedside Patient reports she was feeling better yesterday but started to feel chest discomfort and heaviness in bilateral arms this morning and symptoms improved with Nitroglycerine Added Ranolazine , anticipate discharge tomorrow if doing better  Objective: Vitals:   10/07/24 0423 10/07/24 0500 10/07/24 0906 10/07/24 1127  BP: (!) 142/82  (!) 169/71 (!) 153/72  Pulse: 86  71 72  Resp: 19   20  Temp: (!) 97.4 F (36.3 C)  98.7 F (37.1 C) 97.9 F (36.6 C)  TempSrc:   Oral   SpO2: 97%  97% 95%  Weight:  80.5 kg    Height:        Intake/Output Summary (Last 24 hours) at 10/07/2024 1350 Last data filed at 10/07/2024 0900 Gross per 24 hour  Intake 243 ml  Output --  Net 243 ml   Filed Weights   10/05/24 1822 10/06/24 1847 10/07/24 0500  Weight: 81.6 kg 81 kg 80.5 kg    Examination: General: Not in acute distress Neck: No JVD Cardiac: S1/S2, RRR, No gallops or rubs. Respiratory: No rales, wheezing, rhonchi or rubs. GI: Soft, nondistended, nontender, no rebound pain, no organomegaly, BS present. Ext: has trace leg edema bilaterally Musculoskeletal: No joint deformities, No joint redness or warmth, no limitation of ROM in spin. Skin: No rashes.  Neuro: Alert, oriented X3, cranial nerves II-XII grossly intact, moves all  extremities normally  Assessment & Plan:  Unstable angina Chest pain and hx of CAD s/p DES LAD Presented with intermittent chest pain Echo with normal EF 55-60%, G1DD Underwent LHC today 01/12 shows severe one-vessel CAD in mid LAD due to severe in-stent restenosis, successful balloon angioplasty If patient develops recurrent restenosis, recommend treatment with drug-coated balloon Asa, Brilinta , statin, Coreg , Imdur  Added ranolazine  500 mg bid prn Nitroglycerin   HTN (hypertension) Change propranolol  to Coreg , on Imdur  Hydralazine  as needed with holding parameters   Hypercholesteremia Crestor    Chronic diastolic CHF (congestive heart failure) 2D echo on 10/15/2019 showed EF of 50-55%. BNP elevated, s/p 1 dose IV Lasix  20 mg Continue home Lasix  20mg  daily  Hypokalemia Hypomagnesemia Repleted  Chronic kidney disease, stage 3a Monitor Cr  Normocytic anemia Monitor Hb  Overweight (BMI 25.0-29.9): Body weight 85.6 kg, BMI 29.95 Encourage losing weight Exercise healthy diet    DVT prophylaxis: Lovenox  SQ   Code Status: Limited: Do not attempt resuscitation (DNR) -DNR-LIMITED -Do Not Intubate/DNI  Disposition:  Home  Consultants:  Treatment Team:  Consulting Physician: Darron Deatrice LABOR, MD  Procedures:  LHC 01/12  Antimicrobials:  Anti-infectives (From admission, onward)    None       Data Reviewed: I have personally reviewed following labs and imaging studies CBC: Recent Labs  Lab 10/02/24 0955 10/05/24 1827 10/06/24 0522 10/07/24 0454  WBC 6.0 5.3 5.9 5.8  HGB 12.1 11.4* 12.2 11.5*  HCT 38.1 36.2 37.8 35.8*  MCV 91.6 92.6 89.6 89.7  PLT 307 315 306 267   Basic Metabolic Panel: Recent Labs  Lab 10/02/24 0955 10/05/24 1827 10/06/24 0522 10/07/24 0454  NA 141 141 145 139  K 3.9 4.4 3.7 3.3*  CL 103 105 104 100  CO2 29 26 28 28   GLUCOSE 94 105* 102* 105*  BUN 13 15 10 15   CREATININE 0.84 0.89 0.80 0.96  CALCIUM  9.6 9.2 9.7 9.5  MG  --    --   --  1.6*   GFR: Estimated Creatinine Clearance: 51.4 mL/min (by C-G formula based on SCr of 0.96 mg/dL). Liver Function Tests: Recent Labs  Lab 10/02/24 0955  AST 20  ALT 9  ALKPHOS 102  BILITOT 0.6  PROT 7.1  ALBUMIN 4.2   CBG: Recent Labs  Lab 10/07/24 1130  GLUCAP 159*    No results found for this or any previous visit (from the past 240 hours).   Radiology Studies: ECHOCARDIOGRAM COMPLETE Result Date: 10/07/2024    ECHOCARDIOGRAM REPORT   Patient Name:   Amanda Lambert [place] Date of Exam: 10/06/2024 Medical Rec #:  [id number]      Height:       65.0 in Accession #:    [id number]     Weight:       178.6 lb Date of Birth:  Jun 08, 1946       BSA:          1.885 m Patient Age:    78 years       BP:           164/73 mmHg Patient Gender: F              HR:           70 bpm. Exam Location:  [facility] Procedure: 2D Echo, Cardiac Doppler and Color Doppler (Both Spectral and Color            Flow Doppler were utilized during procedure). Indications:     R07.9 Chest pain  History:         Patient has prior history of Echocardiogram examinations, most                  recent 05/19/2021. CAD, Arrythmias:LBBB, Signs/Symptoms:Syncope;                  Risk Factors:Hypertension.  Sonographer:     Carl Rodgers-Jones RDCS Referring Phys:  [id number] FLYJFFJI A ARIDA Diagnosing Phys: Lonni Hanson MD IMPRESSIONS  1. Left ventricular ejection fraction, by estimation, is 55 to 60%. The left ventricle has normal function. The left ventricle has no regional wall motion abnormalities. There is mild left ventricular hypertrophy. Left ventricular diastolic parameters are consistent with Grade I diastolic dysfunction (impaired relaxation).  2. Right ventricular systolic function is normal. The right ventricular size is normal.  3. The mitral valve is degenerative. Mild mitral valve regurgitation. No evidence of mitral stenosis.  4. The aortic valve was not well visualized. There is mild calcification of the aortic valve.  Aortic valve regurgitation is not visualized. Aortic valve sclerosis/calcification is present, without any evidence of aortic stenosis. FINDINGS  Left Ventricle: Left ventricular ejection fraction, by estimation, is 55 to 60%. The left ventricle has normal function. The left ventricle has no regional wall motion abnormalities. The left ventricular internal cavity size was normal in size. There is  mild left ventricular hypertrophy. Abnormal (paradoxical) septal motion, consistent with left bundle branch block.  Left ventricular diastolic parameters are consistent with Grade I diastolic dysfunction (impaired relaxation). Right Ventricle: The right ventricular size is normal. No increase in right ventricular wall thickness. Right ventricular systolic function is normal. Left Atrium: Left atrial size was normal in size. Right Atrium: Right atrial size was normal in size. Pericardium: There is no evidence of pericardial effusion. Mitral Valve: The mitral valve is degenerative in appearance. There is mild thickening of the mitral valve leaflet(s). There is mild calcification of the mitral valve leaflet(s). Mild mitral valve regurgitation. No evidence of mitral valve stenosis. Tricuspid Valve: The tricuspid valve is not well visualized. Tricuspid valve regurgitation is trivial. Aortic Valve: The aortic valve was not well visualized. There is mild calcification of the aortic valve. Aortic valve regurgitation is not visualized. Aortic valve sclerosis/calcification is present, without any evidence of aortic stenosis. Aortic valve mean gradient measures 5.6 mmHg. Aortic valve peak gradient measures 10.7 mmHg. Aortic valve area, by VTI measures 1.84 cm. Pulmonic Valve: The pulmonic valve was not well visualized. Pulmonic valve regurgitation is not visualized. No evidence of pulmonic stenosis. Aorta: The aortic root is normal in size and structure. Pulmonary Artery: The pulmonary artery is not well seen. IAS/Shunts: The  interatrial septum was not well visualized.  LEFT VENTRICLE PLAX 2D LVIDd:         3.90 cm   Diastology LVIDs:         2.80 cm   LV e' medial:    3.86 cm/s LV PW:         1.10 cm   LV E/e' medial:  11.2 LV IVS:        1.10 cm   LV e' lateral:   4.73 cm/s LVOT diam:     1.90 cm   LV E/e' lateral: 9.2 LV SV:         55 LV SV Index:   29 LVOT Area:     2.84 cm  RIGHT VENTRICLE RV Basal diam:  3.60 cm RV S prime:     13.70 cm/s TAPSE (M-mode): 2.4 cm LEFT ATRIUM             Index        RIGHT ATRIUM           Index LA diam:        4.00 cm 2.12 cm/m   RA Area:     11.90 cm LA Vol (A2C):   38.8 ml 20.58 ml/m  RA Volume:   30.00 ml  15.91 ml/m LA Vol (A4C):   52.2 ml 27.69 ml/m LA Biplane Vol: 46.8 ml 24.83 ml/m  AORTIC VALVE AV Area (Vmax):    1.72 cm AV Area (Vmean):   1.63 cm AV Area (VTI):     1.84 cm AV Vmax:           163.86 cm/s AV Vmean:          111.084 cm/s AV VTI:            0.301 m AV Peak Grad:      10.7 mmHg AV Mean Grad:      5.6 mmHg LVOT Vmax:         99.65 cm/s LVOT Vmean:        63.750 cm/s LVOT VTI:          0.195 m LVOT/AV VTI ratio: 0.65  AORTA Ao Root diam: 3.30 cm MITRAL VALVE MV Area (PHT): 2.93 cm    SHUNTS MV Decel Time: 259 msec  Systemic VTI:  0.20 m MV E velocity: 43.45 cm/s  Systemic Diam: 1.90 cm MV A velocity: 83.30 cm/s MV E/A ratio:  0.52 Lonni Hanson MD Electronically signed by Lonni Hanson MD Signature Date/Time: 10/07/2024/7:37:00 AM    Final    CARDIAC CATHETERIZATION Result Date: 10/06/2024   Mid LAD-1 lesion is 10% stenosed.   Dist LAD lesion is 30% stenosed.   Mid LAD-2 lesion is 90% stenosed.   2nd Diag lesion is 30% stenosed.   Prox LAD to Mid LAD lesion is 30% stenosed.   Prox RCA lesion is 20% stenosed.   Balloon angioplasty was performed using a BALLOON TREK RX 2.5X12.   Post intervention, there is a 0% residual stenosis.   The left ventricular systolic function is normal.   LV end diastolic pressure is normal.   The left ventricular ejection fraction is  55-65% by visual estimate.   In the absence of any other complications or medical issues, we expect the patient to be ready for discharge from an interventional cardiology perspective on 10/07/2024.   Recommend uninterrupted dual antiplatelet therapy with Aspirin  81mg  daily and Ticagrelor  90mg  twice daily for a minimum of 12 months (ACS-Class I recommendation). 1.  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  No other obstructive disease. 2.  Normal LV systolic function normal left ventricular end-diastolic pressure. 3.  Successful balloon angioplasty to the mid LAD for in-stent restenosis.  OCT was performed before and showed severe neointimal hyperplasia with no evidence of mechanical issues or stent underexpansion.  The restenosis did not extend outside the stent distally. Recommendations: If she develops recurrent restenosis, recommend treatment with a drug-coated balloon. I switch propranolol  to carvedilol  for better blood pressure control.   DG Chest 2 View Result Date: 10/05/2024 EXAM: 2 VIEW(S) XRAY OF THE CHEST 10/05/2024 06:55:00 PM COMPARISON: X-ray 10/02/2024, CT chest 08/28/2016. CLINICAL HISTORY: CP. FINDINGS: LUNGS AND PLEURA: No focal pulmonary opacity. No pleural effusion. No pneumothorax. HEART AND MEDIASTINUM: Calcified aorta. Small hiatal hernia again noted. BONES AND SOFT TISSUES: Right shoulder prosthesis noted. Prior vertebral augmentation. IMPRESSION: 1. No acute cardiopulmonary abnormality. Electronically signed by: Morgane Naveau MD MD 10/05/2024 07:00 PM EST RP Workstation: [id number]    Scheduled Meds:  aspirin  EC  81 mg Oral Daily   carvedilol   6.25 mg Oral BID WC   cholecalciferol   1,000 Units Oral Daily   free water   500 mL Oral Once   free water   500 mL Oral Once   furosemide   20 mg Oral Daily   gabapentin   300 mg Oral BID   isosorbide  mononitrate  120 mg Oral Daily   pantoprazole   40 mg Oral Daily   ranolazine   500 mg  Oral BID   rosuvastatin   20 mg Oral Daily   sodium chloride  flush  3 mL Intravenous Q12H   ticagrelor   90 mg Oral BID   Continuous Infusions:     LOS: 1 day  MDM: Patient is high risk for one or more organ failure.  They necessitate ongoing hospitalization for continued IV therapies and subsequent lab monitoring. Total time spent interpreting labs and vitals, reviewing the medical record, coordinating care amongst consultants and care team members, directly assessing and discussing care with the patient and/or family: 36  min Laree Lock, MD [facility]  To contact the attending physician between 7A-7P please use Epic Chat. To contact the covering physician during after hours 7P-7A, please review Amion.  10/07/2024, 1:50 PM   *  This document has been created with the assistance of dictation software. Please excuse typographical errors. *   "

## 2024-10-07 NOTE — Plan of Care (Signed)
 VSS. RA. Patient seen by cardiology. See notes. Patient complaining of intermittent chest pain. SL NTG given. Cards and MD notified. EKG done. Medication added. Possible d/c tomorrow.  Problem: Education: Goal: Understanding of CV disease, CV risk reduction, and recovery process will improve Outcome: Progressing Goal: Individualized Educational Video(s) Outcome: Progressing   Problem: Activity: Goal: Ability to return to baseline activity level will improve Outcome: Progressing   Problem: Cardiovascular: Goal: Ability to achieve and maintain adequate cardiovascular perfusion will improve Outcome: Progressing Goal: Vascular access site(s) Level 0-1 will be maintained Outcome: Progressing   Problem: Health Behavior/Discharge Planning: Goal: Ability to safely manage health-related needs after discharge will improve Outcome: Progressing   Problem: Education: Goal: Knowledge of General Education information will improve Description: Including pain rating scale, medication(s)/side effects and non-pharmacologic comfort measures Outcome: Progressing   Problem: Health Behavior/Discharge Planning: Goal: Ability to manage health-related needs will improve Outcome: Progressing   Problem: Clinical Measurements: Goal: Ability to maintain clinical measurements within normal limits will improve Outcome: Progressing Goal: Will remain free from infection Outcome: Progressing Goal: Diagnostic test results will improve Outcome: Progressing Goal: Respiratory complications will improve Outcome: Progressing Goal: Cardiovascular complication will be avoided Outcome: Progressing   Problem: Activity: Goal: Risk for activity intolerance will decrease Outcome: Progressing   Problem: Nutrition: Goal: Adequate nutrition will be maintained Outcome: Progressing   Problem: Coping: Goal: Level of anxiety will decrease Outcome: Progressing   Problem: Elimination: Goal: Will not experience  complications related to bowel motility Outcome: Progressing Goal: Will not experience complications related to urinary retention Outcome: Progressing   Problem: Pain Managment: Goal: General experience of comfort will improve and/or be controlled Outcome: Progressing   Problem: Safety: Goal: Ability to remain free from injury will improve Outcome: Progressing   Problem: Skin Integrity: Goal: Risk for impaired skin integrity will decrease Outcome: Progressing

## 2024-10-07 NOTE — Plan of Care (Signed)

## 2024-10-07 NOTE — Care Management Important Message (Signed)
 Important Message  Patient Details  Name: Amanda Lambert MRN: [id number] Date of Birth: 05/01/1946   Important Message Given:  Yes - Medicare IM     Verba Ainley W, CMA 10/07/2024, 11:36 AM

## 2024-10-07 NOTE — TOC CM/SW Note (Signed)
 Transition of Care [facility]) - Inpatient Brief Assessment   Patient Details  Name: Amanda Lambert MRN: [id number] Date of Birth: [birth year]  Transition of Care [facility] CM/SW Contact:    Corean ONEIDA Haddock, RN Phone Number: 10/07/2024, 12:07 PM   Clinical Narrative:  Transition of Care Department [facility]) has reviewed patient and no TOC needs have been identified at this time.  If new patient transition needs arise, please place a TOC consult.   Transition of Care Asessment: Insurance and Status: Insurance coverage has been reviewed       Prior/Current Home Services: No current home services Social Drivers of Health Review: SDOH reviewed no interventions necessary Readmission risk has been reviewed: Yes Transition of care needs: no transition of care needs at this time

## 2024-10-08 ENCOUNTER — Other Ambulatory Visit: Payer: Self-pay | Admitting: Cardiovascular Disease

## 2024-10-08 DIAGNOSIS — I25118 Atherosclerotic heart disease of native coronary artery with other forms of angina pectoris: Secondary | ICD-10-CM

## 2024-10-08 DIAGNOSIS — G250 Essential tremor: Secondary | ICD-10-CM

## 2024-10-08 DIAGNOSIS — I6523 Occlusion and stenosis of bilateral carotid arteries: Secondary | ICD-10-CM

## 2024-10-08 DIAGNOSIS — E876 Hypokalemia: Secondary | ICD-10-CM | POA: Diagnosis present

## 2024-10-08 LAB — BASIC METABOLIC PANEL WITH GFR
Anion gap: 14 (ref 5–15)
BUN: 17 mg/dL (ref 8–23)
CO2: 22 mmol/L (ref 22–32)
Calcium: 9.5 mg/dL (ref 8.9–10.3)
Chloride: 100 mmol/L (ref 98–111)
Creatinine, Ser: 1.14 mg/dL — ABNORMAL HIGH (ref 0.44–1.00)
GFR, Estimated: 49 mL/min — ABNORMAL LOW
Glucose, Bld: 99 mg/dL (ref 70–99)
Potassium: 4.7 mmol/L (ref 3.5–5.1)
Sodium: 137 mmol/L (ref 135–145)

## 2024-10-08 LAB — CBC
HCT: 35.5 % — ABNORMAL LOW (ref 36.0–46.0)
Hemoglobin: 11.9 g/dL — ABNORMAL LOW (ref 12.0–15.0)
MCH: 29.3 pg (ref 26.0–34.0)
MCHC: 33.5 g/dL (ref 30.0–36.0)
MCV: 87.4 fL (ref 80.0–100.0)
Platelets: 256 K/uL (ref 150–400)
RBC: 4.06 MIL/uL (ref 3.87–5.11)
RDW: 12.2 % (ref 11.5–15.5)
WBC: 5.8 K/uL (ref 4.0–10.5)
nRBC: 0 % (ref 0.0–0.2)

## 2024-10-08 LAB — MAGNESIUM: Magnesium: 2.2 mg/dL (ref 1.7–2.4)

## 2024-10-08 MED ORDER — POTASSIUM CHLORIDE CRYS ER 20 MEQ PO TBCR: 20.0000 meq | EXTENDED_RELEASE_TABLET | Freq: Every day | ORAL | 3 refills | Status: AC

## 2024-10-08 MED ORDER — CARVEDILOL 12.5 MG PO TABS: 12.5000 mg | ORAL_TABLET | Freq: Two times a day (BID) | ORAL | Status: DC

## 2024-10-08 MED ORDER — RANOLAZINE ER 500 MG PO TB12: 500.0000 mg | ORAL_TABLET | Freq: Two times a day (BID) | ORAL | 0 refills | Status: DC

## 2024-10-08 MED ORDER — TICAGRELOR 90 MG PO TABS: 90.0000 mg | ORAL_TABLET | Freq: Two times a day (BID) | ORAL | 0 refills | Status: AC

## 2024-10-08 MED ORDER — CARVEDILOL 12.5 MG PO TABS: 12.5000 mg | ORAL_TABLET | Freq: Two times a day (BID) | ORAL | 0 refills | Status: AC

## 2024-10-08 MED ORDER — ROSUVASTATIN CALCIUM 20 MG PO TABS: 20.0000 mg | ORAL_TABLET | Freq: Every day | ORAL | 0 refills | Status: AC

## 2024-10-08 MED ORDER — ABALOPARATIDE 3120 MCG/1.56ML ~~LOC~~ SOPN: 80.0000 ug | PEN_INJECTOR | Freq: Every day | SUBCUTANEOUS | 0 refills | Status: AC

## 2024-10-08 MED ORDER — FUROSEMIDE 20 MG PO TABS: 20.0000 mg | ORAL_TABLET | Freq: Every day | ORAL | 0 refills | Status: AC

## 2024-10-08 MED ORDER — NITROGLYCERIN 0.4 MG SL SUBL: 0.4000 mg | SUBLINGUAL_TABLET | SUBLINGUAL | 12 refills | Status: AC | PRN

## 2024-10-08 NOTE — Discharge Summary (Signed)
 " Physician Discharge Summary   Patient: Amanda Lambert MRN: [id number] DOB: [birth year]  Admit date:     10/05/2024  Discharge date: 10/08/2024  Discharge Physician: Vici Novick   PCP: Cleotilde, [place]  E, PA   Recommendations at discharge:   Take medications as recommended Keep scheduled follow-up appointment with cardiology Return to ER for worsening symptoms  Discharge Diagnoses: Principal Problem:   Unstable angina (HCC) Active Problems:   CAD (coronary artery disease)   HTN (hypertension)   Hypercholesteremia   Chronic diastolic CHF (congestive heart failure) (HCC)   Chronic kidney disease, stage 3a (HCC)   Normocytic anemia   Overweight (BMI 25.0-29.9)   Hypokalemia   Hypomagnesemia  Resolved Problems:   * No resolved hospital problems. *  Hospital Course: Amanda Lambert is a 79 y.o. female with medical history significant of CAD, s/p of stent to LAD, dCHF, LBBB, HTN, HLD, CKD-3a, IBS, varicose vein, bilateral carotid artery stenosis, chronic low back pain who presented for evaluation of chest pain.   Patient states that she has intermittent chest pain in the past several days.  The chest pain is located in the substernal and the left side of her chest, come and goes, moderate, pressure-like, radiating to bilateral shoulder and arms, not associated with SOB.  No cough, fever or chills.  No recent long distant traveling.  No tenderness in calf area.  Has nausea, no vomiting, diarrhea or abdominal pain.  No symptoms UTI.  No recent fall or head injury.  No dark stool or rectal bleeding. Pt states that she took 650 mg of ASA mixed with gabapentin  this morning for back pain.    Of note, pt was seen in ED on 10/03/23. She was encouraged to stay for admission, but she declined.  Assessment and Plan:  Unstable angina Hx of CAD s/p DES LAD Presented with intermittent chest pain Echo with normal EF 55-60%, G1DD Underwent LHC on 01/12 showed severe one-vessel CAD in mid LAD due  to severe in-stent restenosis, status post successful balloon angioplasty If patient develops recurrent restenosis, recommend treatment with drug-coated balloon Patient to be discharged home on Asa, Brilinta , statin, Coreg , Imdur  and Ranexa  Continue prn Nitroglycerin    HTN (hypertension) Continue Coreg  and Imdur  Patient was on propranolol  which has been discontinued   Hypercholesteremia Crestor    Chronic diastolic CHF (congestive heart failure) 2D echo on 10/15/2019 showed EF of 50-55%. Continue furosemide  on discharge    Hypokalemia Hypomagnesemia Secondary to diuretic therapy Supplemented   Chronic kidney disease, stage 3a Monitor Cr   Normocytic anemia Monitor Hb   Overweight (BMI 25.0-29.9): Body weight 85.6 kg, BMI 29.95 Encourage losing weight Exercise healthy diet           Consultants: Cardiology Procedures performed: Balloon angioplasty Disposition: Home Diet recommendation:  Discharge Diet Orders (From admission, onward)     Start     Ordered   10/08/24 0000  Diet - low sodium heart healthy        10/08/24 1052   10/08/24 0000  Diet - low sodium heart healthy        10/08/24 1058           Cardiac diet DISCHARGE MEDICATION: Allergies as of 10/08/2024       Reactions   Ace Inhibitors Anaphylaxis   Angioedema.   Bee Venom Anaphylaxis, Other (See Comments)   Levofloxacin     Other Reaction(s): Not available levofloxacin    Shellfish Allergy Hives, Rash, Dermatitis   splotches Shellfish (substance)  Thorazine [chlorpromazine] Anaphylaxis   Wasp Venom Anaphylaxis   Levaquin  [levofloxacin  In D5w] Nausea And Vomiting   Stated by patient she could not handle levaquin  even with antiemetics    Amlodipine     severe leg edema   Atropine Sulfate Other (See Comments)   Elemental Sulfur Itching      Glycopyrrolate  Other (See Comments)   Ramipril Other (See Comments)   Sulfa Antibiotics Other (See Comments)   Compazine [prochlorperazine  Edisylate] Anxiety   Latex Rash        Medication List     STOP taking these medications    ALIGN PO   calcitonin (salmon) 200 UNIT/ACT nasal spray Commonly known as: MIACALCIN /FORTICAL   estradiol  0.1 MG/GM vaginal cream Commonly known as: ESTRACE    lidocaine  5 % Commonly known as: LIDODERM    Myrbetriq 25 MG Tb24 tablet Generic drug: mirabegron ER   propranolol  20 MG tablet Commonly known as: INDERAL    traMADol 50 MG tablet Commonly known as: ULTRAM   VITAMIN D  PO       TAKE these medications    Abaloparatide  3120 MCG/1.56ML Sopn Inject 80 mcg into the skin daily. What changed:  how much to take when to take this   aspirin  EC 81 MG tablet Take 81 mg by mouth daily. Swallow whole.   carvedilol  12.5 MG tablet Commonly known as: COREG  Take 1 tablet (12.5 mg total) by mouth 2 (two) times daily with a meal.   cyclobenzaprine  10 MG tablet Commonly known as: FLEXERIL  TAKE 1 TABLET BY MOUTH THREE TIMES A DAY AS NEEDED FOR MUSCLE SPASMS   EPINEPHrine  0.3 mg/0.3 mL Soaj injection Commonly known as: EPI-PEN Inject 0.3 mg into the muscle once as needed for anaphylaxis.   esomeprazole 40 MG capsule Commonly known as: NEXIUM Take 40 mg by mouth in the morning.   furosemide  20 MG tablet Commonly known as: LASIX  Take 1 tablet (20 mg total) by mouth daily. What changed:  when to take this reasons to take this   gabapentin  300 MG capsule Commonly known as: NEURONTIN  Take 300 mg by mouth 2 (two) times daily.   isosorbide  mononitrate 120 MG 24 hr tablet Commonly known as: IMDUR  TAKE 1 TABLET BY MOUTH EVERY DAY   magnesium  30 MG tablet Take 30 mg by mouth 2 (two) times daily.   nitroGLYCERIN  0.4 MG SL tablet Commonly known as: NITROSTAT  Place 1 tablet (0.4 mg total) under the tongue every 5 (five) minutes as needed for chest pain.   ondansetron  4 MG disintegrating tablet Commonly known as: ZOFRAN -ODT Take 1 tablet (4 mg total) by mouth every 8  (eight) hours as needed for nausea or vomiting.   potassium chloride  SA 20 MEQ tablet Commonly known as: Klor-Con  M20 Take 1 tablet (20 mEq total) by mouth daily.   ranolazine  500 MG 12 hr tablet Commonly known as: RANEXA  Take 1 tablet (500 mg total) by mouth 2 (two) times daily.   rosuvastatin  20 MG tablet Commonly known as: CRESTOR  Take 1 tablet (20 mg total) by mouth daily. Start taking on: October 09, 2024 What changed:  medication strength how much to take   ticagrelor  90 MG Tabs tablet Commonly known as: BRILINTA  Take 1 tablet (90 mg total) by mouth 2 (two) times daily.   Vitamin D  (Ergocalciferol ) 1.25 MG (50000 UNIT) Caps capsule Commonly known as: DRISDOL Take 1 capsule by mouth every 7 (seven) days.        Follow-up Information     Cleotilde, [place]  E,  PA Follow up in 1 week(s).   Specialty: Internal Medicine Contact information: [address] [place] [address] [phone]         Darron Deatrice LABOR, MD Follow up in 1 week(s).   Specialty: Cardiology Contact information: [address] [place] [address] [phone]                Discharge Exam: Amanda Lambert   10/06/24 1847 10/07/24 0500 10/08/24 0405  Weight: 81 kg 80.5 kg 81.2 kg   General: Not in acute distress Neck: No JVD Cardiac: S1/S2, RRR, No gallops or rubs. Respiratory: No rales, wheezing, rhonchi or rubs. GI: Soft, nondistended, nontender, no rebound pain, no organomegaly, BS present. Ext: has trace leg edema bilaterally Musculoskeletal: No joint deformities, No joint redness or warmth, no limitation of ROM in spin. Skin: No rashes.  Neuro: Alert, oriented X3, cranial nerves II-XII grossly intact, moves all extremities normally  Condition at discharge: stable  The results of significant diagnostics from this hospitalization (including imaging, microbiology, ancillary and laboratory) are listed below for reference.   Imaging  Studies: ECHOCARDIOGRAM COMPLETE Result Date: 10/07/2024    ECHOCARDIOGRAM REPORT   Patient Name:   Amanda Lambert Date of Exam: 10/06/2024 Medical Rec #:  [id number]      Height:       65.0 in Accession #:    [id number]     Weight:       178.6 lb Date of Birth:  30-May-1946       BSA:          1.885 m Patient Age:    78 years       BP:           164/73 mmHg Patient Gender: F              HR:           70 bpm. Exam Location:  [facility] Procedure: 2D Echo, Cardiac Doppler and Color Doppler (Both Spectral and Color            Flow Doppler were utilized during procedure). Indications:     R07.9 Chest pain  History:         Patient has prior history of Echocardiogram examinations, most                  recent 05/19/2021. CAD, Arrythmias:LBBB, Signs/Symptoms:Syncope;                  Risk Factors:Hypertension.  Sonographer:     Carl Rodgers-Jones RDCS Referring Phys:  [id number] FLYJFFJI A ARIDA Diagnosing Phys: Lonni Hanson MD IMPRESSIONS  1. Left ventricular ejection fraction, by estimation, is 55 to 60%. The left ventricle has normal function. The left ventricle has no regional wall motion abnormalities. There is mild left ventricular hypertrophy. Left ventricular diastolic parameters are consistent with Grade I diastolic dysfunction (impaired relaxation).  2. Right ventricular systolic function is normal. The right ventricular size is normal.  3. The mitral valve is degenerative. Mild mitral valve regurgitation. No evidence of mitral stenosis.  4. The aortic valve was not well visualized. There is mild calcification of the aortic valve. Aortic valve regurgitation is not visualized. Aortic valve sclerosis/calcification is present, without any evidence of aortic stenosis. FINDINGS  Left Ventricle: Left ventricular ejection fraction, by estimation, is 55 to 60%. The left ventricle has normal function. The left ventricle has no regional wall motion abnormalities. The left ventricular internal cavity size  was normal in size.  There is  mild left ventricular hypertrophy. Abnormal (paradoxical) septal motion, consistent with left bundle branch block. Left ventricular diastolic parameters are consistent with Grade I diastolic dysfunction (impaired relaxation). Right Ventricle: The right ventricular size is normal. No increase in right ventricular wall thickness. Right ventricular systolic function is normal. Left Atrium: Left atrial size was normal in size. Right Atrium: Right atrial size was normal in size. Pericardium: There is no evidence of pericardial effusion. Mitral Valve: The mitral valve is degenerative in appearance. There is mild thickening of the mitral valve leaflet(s). There is mild calcification of the mitral valve leaflet(s). Mild mitral valve regurgitation. No evidence of mitral valve stenosis. Tricuspid Valve: The tricuspid valve is not well visualized. Tricuspid valve regurgitation is trivial. Aortic Valve: The aortic valve was not well visualized. There is mild calcification of the aortic valve. Aortic valve regurgitation is not visualized. Aortic valve sclerosis/calcification is present, without any evidence of aortic stenosis. Aortic valve mean gradient measures 5.6 mmHg. Aortic valve peak gradient measures 10.7 mmHg. Aortic valve area, by VTI measures 1.84 cm. Pulmonic Valve: The pulmonic valve was not well visualized. Pulmonic valve regurgitation is not visualized. No evidence of pulmonic stenosis. Aorta: The aortic root is normal in size and structure. Pulmonary Artery: The pulmonary artery is not well seen. IAS/Shunts: The interatrial septum was not well visualized.  LEFT VENTRICLE PLAX 2D LVIDd:         3.90 cm   Diastology LVIDs:         2.80 cm   LV e' medial:    3.86 cm/s LV PW:         1.10 cm   LV E/e' medial:  11.2 LV IVS:        1.10 cm   LV e' lateral:   4.73 cm/s LVOT diam:     1.90 cm   LV E/e' lateral: 9.2 LV SV:         55 LV SV Index:   29 LVOT Area:     2.84 cm  RIGHT VENTRICLE RV Basal diam:  3.60  cm RV S prime:     13.70 cm/s TAPSE (M-mode): 2.4 cm LEFT ATRIUM             Index        RIGHT ATRIUM           Index LA diam:        4.00 cm 2.12 cm/m   RA Area:     11.90 cm LA Vol (A2C):   38.8 ml 20.58 ml/m  RA Volume:   30.00 ml  15.91 ml/m LA Vol (A4C):   52.2 ml 27.69 ml/m LA Biplane Vol: 46.8 ml 24.83 ml/m  AORTIC VALVE AV Area (Vmax):    1.72 cm AV Area (Vmean):   1.63 cm AV Area (VTI):     1.84 cm AV Vmax:           163.86 cm/s AV Vmean:          111.084 cm/s AV VTI:            0.301 m AV Peak Grad:      10.7 mmHg AV Mean Grad:      5.6 mmHg LVOT Vmax:         99.65 cm/s LVOT Vmean:        63.750 cm/s LVOT VTI:          0.195 m LVOT/AV VTI ratio: 0.65  AORTA Ao Root diam: 3.30 cm MITRAL VALVE MV Area (PHT): 2.93 cm    SHUNTS MV Decel Time: 259 msec    Systemic VTI:  0.20 m MV E velocity: 43.45 cm/s  Systemic Diam: 1.90 cm MV A velocity: 83.30 cm/s MV E/A ratio:  0.52 Lonni Hanson MD Electronically signed by Lonni Hanson MD Signature Date/Time: 10/07/2024/7:37:00 AM    Final    CARDIAC CATHETERIZATION Result Date: 10/06/2024   Mid LAD-1 lesion is 10% stenosed.   Dist LAD lesion is 30% stenosed.   Mid LAD-2 lesion is 90% stenosed.   2nd Diag lesion is 30% stenosed.   Prox LAD to Mid LAD lesion is 30% stenosed.   Prox RCA lesion is 20% stenosed.   Balloon angioplasty was performed using a BALLOON TREK RX 2.5X12.   Post intervention, there is a 0% residual stenosis.   The left ventricular systolic function is normal.   LV end diastolic pressure is normal.   The left ventricular ejection fraction is 55-65% by visual estimate.   In the absence of any other complications or medical issues, we expect the patient to be ready for discharge from an interventional cardiology perspective on 10/07/2024.   Recommend uninterrupted dual antiplatelet therapy with Aspirin  81mg  daily and Ticagrelor  90mg  twice daily for a minimum of 12 months (ACS-Class I recommendation). 1.  Severe one-vessel coronary artery  disease due to severe in-stent restenosis in the mid LAD.  No other obstructive disease. 2.  Normal LV systolic function normal left ventricular end-diastolic pressure. 3.  Successful balloon angioplasty to the mid LAD for in-stent restenosis.  OCT was performed before and showed severe neointimal hyperplasia with no evidence of mechanical issues or stent underexpansion.  The restenosis did not extend outside the stent distally. Recommendations: If she develops recurrent restenosis, recommend treatment with a drug-coated balloon. I switch propranolol  to carvedilol  for better blood pressure control.   DG Chest 2 View Result Date: 10/05/2024 EXAM: 2 VIEW(S) XRAY OF THE CHEST 10/05/2024 06:55:00 PM COMPARISON: X-ray 10/02/2024, CT chest 08/28/2016. CLINICAL HISTORY: CP. FINDINGS: LUNGS AND PLEURA: No focal pulmonary opacity. No pleural effusion. No pneumothorax. HEART AND MEDIASTINUM: Calcified aorta. Small hiatal hernia again noted. BONES AND SOFT TISSUES: Right shoulder prosthesis noted. Prior vertebral augmentation. IMPRESSION: 1. No acute cardiopulmonary abnormality. Electronically signed by: Morgane Naveau MD MD 10/05/2024 07:00 PM EST RP Workstation: [id number]   DG Chest 2 View Result Date: 10/02/2024 CLINICAL DATA:  Chest pain since last night. EXAM: CHEST - 2 VIEW COMPARISON:  Chest x-ray 02/11/2024 and lumbar spine 01/22/2024 FINDINGS: Lungs are adequately inflated without focal airspace consolidation or effusion. Cardiomediastinal silhouette and remainder of the exam is unchanged. EKG lead projects over the right upper lung. IMPRESSION: No active cardiopulmonary disease. Electronically Signed   By: Toribio Agreste M.D.   On: 10/02/2024 10:25    Microbiology: Results for orders placed or performed during the hospital encounter of 08/06/24  Urine Culture     Status: Abnormal   Collection Time: 08/06/24  2:50 PM   Specimen: Urine, Clean Catch  Result Value Ref Range Status   Specimen Description  URINE, CLEAN CATCH  Final   Special Requests   Final    NONE Performed at [facility], [address], [place], [place] [address]    Culture >=100,000 COLONIES/mL ESCHERICHIA COLI (A)  Final   Report Status 08/08/2024 FINAL  Final   Organism ID, Bacteria ESCHERICHIA COLI (A)  Final      Susceptibility  Escherichia coli - MIC*    AMPICILLIN <=2 SENSITIVE Sensitive     CEFAZOLIN  (URINE) Value in next row Sensitive      2 SENSITIVEThis is a modified FDA-approved test that has been validated and its performance characteristics determined by the reporting laboratory.  This laboratory is certified under the Clinical Laboratory Improvement Amendments CLIA as qualified to perform high complexity clinical laboratory testing.    CEFEPIME Value in next row Sensitive      2 SENSITIVEThis is a modified FDA-approved test that has been validated and its performance characteristics determined by the reporting laboratory.  This laboratory is certified under the Clinical Laboratory Improvement Amendments CLIA as qualified to perform high complexity clinical laboratory testing.    ERTAPENEM Value in next row Sensitive      2 SENSITIVEThis is a modified FDA-approved test that has been validated and its performance characteristics determined by the reporting laboratory.  This laboratory is certified under the Clinical Laboratory Improvement Amendments CLIA as qualified to perform high complexity clinical laboratory testing.    CEFTRIAXONE  Value in next row Sensitive      2 SENSITIVEThis is a modified FDA-approved test that has been validated and its performance characteristics determined by the reporting laboratory.  This laboratory is certified under the Clinical Laboratory Improvement Amendments CLIA as qualified to perform high complexity clinical laboratory testing.    CIPROFLOXACIN  Value in next row Sensitive      2 SENSITIVEThis is a modified FDA-approved test that has been validated and its performance  characteristics determined by the reporting laboratory.  This laboratory is certified under the Clinical Laboratory Improvement Amendments CLIA as qualified to perform high complexity clinical laboratory testing.    GENTAMICIN Value in next row Sensitive      2 SENSITIVEThis is a modified FDA-approved test that has been validated and its performance characteristics determined by the reporting laboratory.  This laboratory is certified under the Clinical Laboratory Improvement Amendments CLIA as qualified to perform high complexity clinical laboratory testing.    NITROFURANTOIN Value in next row Sensitive      2 SENSITIVEThis is a modified FDA-approved test that has been validated and its performance characteristics determined by the reporting laboratory.  This laboratory is certified under the Clinical Laboratory Improvement Amendments CLIA as qualified to perform high complexity clinical laboratory testing.    TRIMETH/SULFA Value in next row Sensitive      2 SENSITIVEThis is a modified FDA-approved test that has been validated and its performance characteristics determined by the reporting laboratory.  This laboratory is certified under the Clinical Laboratory Improvement Amendments CLIA as qualified to perform high complexity clinical laboratory testing.    AMPICILLIN/SULBACTAM Value in next row Sensitive      2 SENSITIVEThis is a modified FDA-approved test that has been validated and its performance characteristics determined by the reporting laboratory.  This laboratory is certified under the Clinical Laboratory Improvement Amendments CLIA as qualified to perform high complexity clinical laboratory testing.    PIP/TAZO Value in next row Sensitive      <=4 SENSITIVEThis is a modified FDA-approved test that has been validated and its performance characteristics determined by the reporting laboratory.  This laboratory is certified under the Clinical Laboratory Improvement Amendments CLIA as qualified to  perform high complexity clinical laboratory testing.    MEROPENEM Value in next row Sensitive      <=4 SENSITIVEThis is a modified FDA-approved test that has been validated and its performance characteristics determined by the reporting laboratory.  This laboratory is certified under the Clinical Laboratory Improvement Amendments CLIA as qualified to perform high complexity clinical laboratory testing.    * >=100,000 COLONIES/mL ESCHERICHIA COLI    Labs: CBC: Recent Labs  Lab 10/02/24 0955 10/05/24 1827 10/06/24 0522 10/07/24 0454 10/08/24 0419  WBC 6.0 5.3 5.9 5.8 5.8  HGB 12.1 11.4* 12.2 11.5* 11.9*  HCT 38.1 36.2 37.8 35.8* 35.5*  MCV 91.6 92.6 89.6 89.7 87.4  PLT 307 315 306 267 256   Basic Metabolic Panel: Recent Labs  Lab 10/02/24 0955 10/05/24 1827 10/06/24 0522 10/07/24 0454 10/08/24 0419  NA 141 141 145 139 137  K 3.9 4.4 3.7 3.3* 4.7  CL 103 105 104 100 100  CO2 29 26 28 28 22   GLUCOSE 94 105* 102* 105* 99  BUN 13 15 10 15 17   CREATININE 0.84 0.89 0.80 0.96 1.14*  CALCIUM  9.6 9.2 9.7 9.5 9.5  MG  --   --   --  1.6* 2.2   Liver Function Tests: Recent Labs  Lab 10/02/24 0955  AST 20  ALT 9  ALKPHOS 102  BILITOT 0.6  PROT 7.1  ALBUMIN 4.2   CBG: Recent Labs  Lab 10/07/24 1130  GLUCAP 159*    Discharge time spent: greater than 30 minutes.  Signed: Aimee Somerset, MD [facility] 10/08/2024 "

## 2024-10-08 NOTE — Plan of Care (Signed)

## 2024-10-08 NOTE — Progress Notes (Signed)
 "  Progress Note  Patient Name: Amanda Lambert Date of Encounter: 10/08/2024 [facility] Cardiologist: Gordy Bergamo, MD   Interval Summary    Yesterday patient had chest pain relieved with SL NTG. She was kept another night and started on Ranexa .  No further chest pain. She is ready to go home. She has bruising on her cath sites.   Vital Signs Vitals:   10/07/24 2006 10/07/24 2343 10/08/24 0405 10/08/24 0838  BP: 135/60 111/77 (!) 128/57 (!) 171/82  Pulse: 72 71 71 66  Resp: 20 18 18 18   Temp: 98 F (36.7 C) 98.3 F (36.8 C) 98.1 F (36.7 C) 97.9 F (36.6 C)  TempSrc: Oral Oral Oral   SpO2: 98% 95% 95% 97%  Weight:   81.2 kg   Height:        Intake/Output Summary (Last 24 hours) at 10/08/2024 1026 Last data filed at 10/08/2024 1023 Gross per 24 hour  Intake 373 ml  Output --  Net 373 ml      10/08/2024    4:05 AM 10/07/2024    5:00 AM 10/06/2024    6:47 PM  Last 3 Weights  Weight (lbs) 179 lb 0.2 oz 177 lb 7.5 oz 178 lb 9.2 oz  Weight (kg) 81.2 kg 80.5 kg 81 kg      Telemetry/ECG  NSR - Personally Reviewed  Physical Exam  GEN: No acute distress.   Neck: No JVD Cardiac: RRR, no murmurs, rubs, or gallops.  Respiratory: Clear to auscultation bilaterally. GI: Soft, nontender, non-distended  MS: No edema     Progress Note   Patient Name: Amanda Lambert Date of Encounter: 10/07/2024 [facility] Cardiologist: Gordy Bergamo, MD    Interval Summary     Patient denies chest pain or SOB. She ambulated to the bathroom without issues. Cath sites are stable. Likely d/c today.   Vital Signs       Vitals:    10/06/24 2045 10/07/24 0027 10/07/24 0423 10/07/24 0500  BP: (!) 164/73 (!) 151/75 (!) 142/82    Pulse: 71 73 86    Resp: 20 20 19     Temp: 98.6 F (37 C) 98 F (36.7 C) (!) 97.4 F (36.3 C)    TempSrc:          SpO2: 97% 98% 97%    Weight:       80.5 kg  Height:              Intake/Output Summary (Last 24 hours) at 10/07/2024 0857 Last  data filed at 10/06/2024 2200    Gross per 24 hour  Intake 47.41 ml  Output --  Net 47.41 ml        10/07/2024    5:00 AM 10/06/2024    6:47 PM 10/05/2024    6:22 PM  Last 3 Weights  Weight (lbs) 177 lb 7.5 oz 178 lb 9.2 oz 180 lb  Weight (kg) 80.5 kg 81 kg 81.647 kg       Telemetry/ECG  NSR HR 60s, PVCs - Personally Reviewed   Physical Exam  GEN: No acute distress.   Neck: No JVD Cardiac: RRR, no murmurs, rubs, or gallops.  Respiratory: Clear to auscultation bilaterally. GI: Soft, nontender, non-distended  MS: No edema   CV studies:   LHC 09/2024   Mid LAD-1 lesion is 10% stenosed.   Dist LAD lesion is 30% stenosed.   Mid LAD-2 lesion is 90% stenosed.   2nd Diag lesion is  30% stenosed.   Prox LAD to Mid LAD lesion is 30% stenosed.   Prox RCA lesion is 20% stenosed.   Balloon angioplasty was performed using a BALLOON TREK RX 2.5X12.   Post intervention, there is a 0% residual stenosis.   The left ventricular systolic function is normal.   LV end diastolic pressure is normal.   The left ventricular ejection fraction is 55-65% by visual estimate.   In the absence of any other complications or medical issues, we expect the patient to be ready for discharge from an interventional cardiology perspective on 10/07/2024.   Recommend uninterrupted dual antiplatelet therapy with Aspirin  81mg  daily and Ticagrelor  90mg  twice daily for a minimum of 12 months (ACS-Class I recommendation).   1.  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  No other obstructive disease. 2.  Normal LV systolic function normal left ventricular end-diastolic pressure. 3.  Successful balloon angioplasty to the mid LAD for in-stent restenosis.  OCT was performed before and showed severe neointimal hyperplasia with no evidence of mechanical issues or stent underexpansion.  The restenosis did not extend outside the stent distally.   Recommendations: If she develops  recurrent restenosis, recommend treatment with a drug-coated balloon. I switch propranolol  to carvedilol  for better blood pressure control.   Echo 09/2024 1. Left ventricular ejection fraction, by estimation, is 55 to 60%. The  left ventricle has normal function. The left ventricle has no regional  wall motion abnormalities. There is mild left ventricular hypertrophy.  Left ventricular diastolic parameters  are consistent with Grade I diastolic dysfunction (impaired relaxation).   2. Right ventricular systolic function is normal. The right ventricular  size is normal.   3. The mitral valve is degenerative. Mild mitral valve regurgitation. No  evidence of mitral stenosis.   4. The aortic valve was not well visualized. There is mild calcification  of the aortic valve. Aortic valve regurgitation is not visualized. Aortic  valve sclerosis/calcification is present, without any evidence of aortic    Patient Profile: Amanda Lambert is a 79 y.o. female with a hx of LBBB, hypertension, chronic kidney disease stage III, bilateral carotid stenosis, CAD status post LAD stenting 06/14/2021 with chronic angina being seen 10/06/2024 for the evaluation of chest pain      Assessment & Plan   NSTEMI/UA CAD s/p DES mid LAD 2022 - patient presented with worsening chest pain and mildly elevated troponin. She has h/o chronic angina on Imdur  120mg  daily - HS trop up to 21 - echo showed normal LVEF - LHC showed severe 1V CAD due to severe ISR in the mid LAD, normal LVSF and normal LVEDP treated with successful balloon angioplasty to the mid LAD for ISR.  - started on DAPT with ASA and Brilinta  x 12 months - continue ASA, Crestor  20mg  daily, Imdur  120mg  daily  - propranolol  switched to Coreg  - cath sites stable - patient had recurrent chest pain relieved with SL NTG and started on Ranexa  - she denies further chest pain, OK for d/c   HFpEF - PTA lasix  20mg  daily continued on admission - proBNP 1421 -  repeat echo showed normal LVEF and G1DD - s/p IV lasix  20mg  once - appears euvolemic - will replete potassium   HTN - Imdur  120mg  daily,  - propranolol  switched to Coreg  - continue to monitor - BP labile   HLD - LDL 52 - Crestor  20mg  daily    For questions or updates, please contact  [facility] Please consult [email or web address] for contact info under         Signed, Martika Egler VEAR Fishman, PA-C   "

## 2024-10-08 NOTE — Plan of Care (Signed)

## 2024-10-09 ENCOUNTER — Ambulatory Visit: Admitting: Neurosurgery

## 2024-10-09 ENCOUNTER — Encounter: Payer: Self-pay | Admitting: Neurosurgery

## 2024-10-09 VITALS — BP 110/60 | Ht 65.0 in | Wt 177.0 lb

## 2024-10-09 DIAGNOSIS — S32020S Wedge compression fracture of second lumbar vertebra, sequela: Secondary | ICD-10-CM

## 2024-10-09 DIAGNOSIS — M545 Low back pain, unspecified: Secondary | ICD-10-CM

## 2024-10-09 DIAGNOSIS — M8000XA Age-related osteoporosis with current pathological fracture, unspecified site, initial encounter for fracture: Secondary | ICD-10-CM

## 2024-10-09 DIAGNOSIS — S32020K Wedge compression fracture of second lumbar vertebra, subsequent encounter for fracture with nonunion: Secondary | ICD-10-CM

## 2024-10-09 NOTE — Progress Notes (Addendum)
 "  Follow-up note: Referring Physician:  Cleotilde Kermitt BRAVO, PA [address] [place],  [place] [address]  Primary Physician:  Cleotilde Bjorn BRAVO, PA  Chief Complaint:  f/u for compression fracture  History of Present Illness: 10/09/2024 Amanda Lambert presents today for ongoing back pain. She states she continues to have low back pain with any movement that resolves with sitting. She feels as though there is a softball on her back like its very tight. She continues to deny radiating leg pain.  Since her last visit she has undergone cardiac MRI evaluation and is now on Brilinta .  She states her pain is worse towards the end of the day after being up and around. She is taking Tylenol  which helps some.  She denies any radiating pain.   01/22/2024 Amanda Lambert presents today for evaluation of ongoing back pain and tightness since her compression fracture and kyphoplasty in December 2024.  She has had ongoing pain in her low back that improves with rest and heat and is exacerbated by either too much activity or too much inactivity. She has not had any radiating symptoms. She states she is not able to do more than 10-15 minutes of activity without having to rest due to spasms and pain in her mid back  10/25/23 Amanda Lambert presents today to review her DEXA results and discuss her right interscapular pain and his response to dry needling and Flexeril . Today she reports resolution of the intrascapular pain she was having at her last visit.  She took a few doses of Flexeril  and this resolved.  She did not end up going to physical therapy for dry needling.  While she does have some tiredness in her low back she denies any significant pain.   10/02/23 Amanda Lambert is a 79 y.o. female who presents today for hospital follow-up of compression fracture. She was seen in the hospital about 4 weeks ago with worsening low back pain and was found to have multiple thoracolumbar compression fractures.  She  underwent kyphoplasty on 09/05/2023 at T12, L1, and L3.  She states that she had some flank pain after this however she presented to the ER on 09/15/2023 and was diagnosed with a UTI.  The symptoms have since resolved.  Her current complaint is pain in her right intrascapular region that is worse with laying flat.  She has been taking gabapentin  600 mg twice daily which does provide some relief.  She denies any pain that radiates down the length of her arm.  She has also tried Robaxin  and over-the-counter medications without any significant relief.  08/31/2023 Amanda Lambert is here today with a chief complaint of worsening pain.   I saw her in clinic approximately 10 days ago.  She was doing well at that time.  Over the past several days, she has had worsening back pain.  She presented with incapacitating pain.  Workup revealed a worsening fracture 2 levels above her previously identified fracture.  She denies any other neurologic symptoms.   08/21/2023 Amanda Lambert is here today with a chief complaint of pain which precipitated workup showing an L2 compression fracture.  She has no new neurologic symptoms.  She was given an LSO brace and has been compliant with her brace.  It does help her.  Her pain is improved significantly over the past couple of weeks.  She is however still having pain.     Conservative measures:  Physical therapy: has not participated in  Multimodal medical therapy including regular antiinflammatories:  baclofen , lidocaine  patches, hydrocodone ,  Injections: has not received epidural steroid injections   Past Surgery: no prior spinal surgeries   Amanda Lambert has no symptoms of cervical myelopathy.   The symptoms are causing a significant impact on the patient's life.    I have utilized the care everywhere function in epic to review the outside records available from external health systems.  Review of Systems:  A 10 point review of systems is negative,  and the  pertinent positives and negatives detailed in the HPI.  Past Medical History: Past Medical History:  Diagnosis Date   Anemia    Angina pectoris    Aortic atherosclerosis    Arthritis    Bilateral carotid artery disease 10/15/2019   a.) carotid doppler 10/15/2019: 50-69% BICA; b.) carotid doppler 04/06/2020: 50-69% RICA, 1-15% LICA; c.) carotid doppler 11/03/2020 and 04/29/2021: 50-69% RICA, 16-49% LICA; d.) carotid doppler 03/02/2022: 50-69% BICA; e.) carotid doppler 03/14/2023: 50-69% RICA, 16-49% LICA   Biliary dyskinesia    CAD (coronary artery disease) 06/14/2021   a.) LHC 11/04/2019: 50% mLAD - med mgmt; b.) LHC/PCI 06/14/2021: 40% p-mLAD, 80% mLAD (3.0 x 30 mm Onyx Frontier DES; c.) LHC for FFR study: 07/19/2021: 40% pLAD; RFR 0.92, FFR 0.89, CFR 4.8, IMR 18 --> no significant macro/microvascular disease   CKD (chronic kidney disease), stage III (HCC)    Complication of anesthesia    a.) PONV; b.) delayed emergence   Constipation    Diastolic dysfunction 10/15/2019   a.) TTE 10/15/2019: EF 50-55%, norm LAP, mild LA dil, mod MR, mild-mod TR, G1DD; b.) TTE 05/19/2021: EF 50-55%, mild basal sep hypertrophy, norm LAP, triv MR/TR, G1DD   GERD (gastroesophageal reflux disease)    Hepatitis B 1975   Hypercholesteremia    Hypertension    Inguinal hernia    Irritable bowel syndrome without diarrhea    LBBB (left bundle branch block)    Leukopenia    Long term current use of aspirin     PONV (postoperative nausea and vomiting)    [facility] spotted fever 04/01/2012   adm. to hospital   Syncope 10/17/2019   Thrombocytopenia    Transaminitis    Trochanteric bursitis, right hip    Vaginal Pap smear, abnormal    Varicose veins of leg with edema, bilateral    Weakness     Past Surgical History: Past Surgical History:  Procedure Laterality Date   APPENDECTOMY  09/25/1961   CHOLECYSTECTOMY  04/26/2012   Procedure: LAPAROSCOPIC CHOLECYSTECTOMY WITH INTRAOPERATIVE CHOLANGIOGRAM;   Surgeon: Deward GORMAN Curvin DOUGLAS, MD;  Location: [facility];  Service: General;  Laterality: N/A;   COLONOSCOPY     CORONARY ANGIOGRAPHY N/A 07/19/2021   Procedure: CORONARY ANGIOGRAPHY (CATH LAB);  Surgeon: Ladona Heinz, MD;  Location: [facility] INVASIVE CV LAB;  Service: Cardiovascular;  Laterality: N/A;   CORONARY BALLOON ANGIOPLASTY N/A 10/06/2024   Procedure: CORONARY BALLOON ANGIOPLASTY;  Surgeon: Darron Deatrice LABOR, MD;  Location: [facility] INVASIVE CV LAB;  Service: Cardiovascular;  Laterality: N/A;   CORONARY IMAGING/OCT N/A 06/14/2021   Procedure: INTRAVASCULAR IMAGING/OCT;  Surgeon: Ladona Heinz, MD;  Location: [facility] INVASIVE CV LAB;  Service: Cardiovascular;  Laterality: N/A;   CORONARY IMAGING/OCT N/A 10/06/2024   Procedure: CORONARY IMAGING/OCT;  Surgeon: Darron Deatrice LABOR, MD;  Location: [facility] INVASIVE CV LAB;  Service: Cardiovascular;  Laterality: N/A;   CORONARY PRESSURE/FFR STUDY N/A 07/19/2021   Procedure: INTRAVASCULAR PRESSURE WIRE/FFR STUDY;  Surgeon: Ladona Heinz, MD;  Location:  [facility] INVASIVE CV LAB;  Service: Cardiovascular;  Laterality: N/A;   CORONARY STENT INTERVENTION N/A 06/14/2021   Procedure: CORONARY STENT INTERVENTION;  Surgeon: Ladona Heinz, MD;  Location: [facility] INVASIVE CV LAB;  Service: Cardiovascular;  Laterality: N/A;   DILATION AND CURETTAGE OF UTERUS  09/25/1980   for miscarriage   DILATION AND CURETTAGE, DIAGNOSTIC / THERAPEUTIC  09/25/1970   INSERTION OF MESH Right 05/03/2023   Procedure: INSERTION OF MESH;  Surgeon: Tye Millet, DO;  Location: [facility] OR[facility];  Service: General;  Laterality: Right;   IR KYPHO EA ADDL LEVEL THORACIC OR LUMBAR  09/05/2023   IR KYPHO LUMBAR INC FX REDUCE BONE BX UNI/BIL CANNULATION INC/IMAGING  09/05/2023   IR KYPHO THORACIC WITH BONE BIOPSY  09/05/2023   LEFT HEART CATH AND CORONARY ANGIOGRAPHY N/A 11/04/2019   Procedure: LEFT HEART CATH AND CORONARY ANGIOGRAPHY;  Surgeon: Ladona Heinz, MD;  Location: [facility] INVASIVE CV LAB;  Service: Cardiovascular;  Laterality: N/A;    LEFT HEART CATH AND CORONARY ANGIOGRAPHY N/A 06/14/2021   Procedure: LEFT HEART CATH AND CORONARY ANGIOGRAPHY;  Surgeon: Ladona Heinz, MD;  Location: [facility] INVASIVE CV LAB;  Service: Cardiovascular;  Laterality: N/A;   LEFT HEART CATH AND CORONARY ANGIOGRAPHY N/A 10/06/2024   Procedure: LEFT HEART CATH AND CORONARY ANGIOGRAPHY;  Surgeon: Darron Deatrice LABOR, MD;  Location: [facility] INVASIVE CV LAB;  Service: Cardiovascular;  Laterality: N/A;   TONSILLECTOMY  1964  - approximate   TOTAL SHOULDER ARTHROPLASTY Right 01/28/2024   WRIST FRACTURE SURGERY  09/25/2006   right    Allergies: Allergies as of 10/09/2024 - Review Complete 10/07/2024  Allergen Reaction Noted   Ace inhibitors Anaphylaxis 03/30/2012   Bee venom Anaphylaxis and Other (See Comments) 01/28/2022   Levofloxacin   08/28/2016   Shellfish allergy Hives, Rash, and Dermatitis 04/04/2014   Thorazine [chlorpromazine] Anaphylaxis 03/30/2012   Wasp venom Anaphylaxis 10/17/2019   Levaquin  [levofloxacin  in d5w] Nausea And Vomiting 08/28/2016   Amlodipine   01/25/2021   Atropine sulfate Other (See Comments) 01/28/2022   Elemental sulfur Itching 10/13/2019   Glycopyrrolate  Other (See Comments) 01/28/2022   Ramipril Other (See Comments) 01/28/2022   Sulfa antibiotics Other (See Comments) 01/28/2022   Compazine [prochlorperazine edisylate] Anxiety 03/30/2012   Latex Rash 08/28/2016    Medications: Outpatient Encounter Medications as of 10/09/2024  Medication Sig   Abaloparatide  3120 MCG/1.56ML SOPN Inject 80 mcg into the skin daily.   aspirin  EC 81 MG tablet Take 81 mg by mouth daily. Swallow whole.   carvedilol  (COREG ) 12.5 MG tablet Take 1 tablet (12.5 mg total) by mouth 2 (two) times daily with a meal.   cyclobenzaprine  (FLEXERIL ) 10 MG tablet TAKE 1 TABLET BY MOUTH THREE TIMES A DAY AS NEEDED FOR MUSCLE SPASMS   EPINEPHrine  0.3 mg/0.3 mL IJ SOAJ injection Inject 0.3 mg into the muscle once as needed for anaphylaxis.   esomeprazole (NEXIUM)  40 MG capsule Take 40 mg by mouth in the morning.   furosemide  (LASIX ) 20 MG tablet Take 1 tablet (20 mg total) by mouth daily.   gabapentin  (NEURONTIN ) 300 MG capsule Take 300 mg by mouth 2 (two) times daily.   isosorbide  mononitrate (IMDUR ) 120 MG 24 hr tablet TAKE 1 TABLET BY MOUTH EVERY DAY   magnesium  30 MG tablet Take 30 mg by mouth 2 (two) times daily.   nitroGLYCERIN  (NITROSTAT ) 0.4 MG SL tablet Place 1 tablet (0.4 mg total) under the tongue every 5 (five) minutes as needed for chest pain.   ondansetron  (ZOFRAN -ODT) 4 MG  disintegrating tablet Take 1 tablet (4 mg total) by mouth every 8 (eight) hours as needed for nausea or vomiting.   potassium chloride  SA (KLOR-CON  M20) 20 MEQ tablet Take 1 tablet (20 mEq total) by mouth daily.   ranolazine  (RANEXA ) 500 MG 12 hr tablet Take 1 tablet (500 mg total) by mouth 2 (two) times daily.   rosuvastatin  (CRESTOR ) 20 MG tablet Take 1 tablet (20 mg total) by mouth daily.   ticagrelor  (BRILINTA ) 90 MG TABS tablet Take 1 tablet (90 mg total) by mouth 2 (two) times daily.   Vitamin D , Ergocalciferol , (DRISDOL) 1.25 MG (50000 UNIT) CAPS capsule Take 1 capsule by mouth every 7 (seven) days.   [DISCONTINUED] Abaloparatide  3120 MCG/1.56ML SOPN Inject 3,120 mcg into the skin every 30 (thirty) days.   [DISCONTINUED] calcitonin, salmon, (MIACALCIN /FORTICAL) 200 UNIT/ACT nasal spray Place 1 spray into alternate nostrils daily. (Patient not taking: Reported on 10/05/2024)   [DISCONTINUED] estradiol  (ESTRACE ) 0.1 MG/GM vaginal cream Place 1 g vaginally 3 (three) times a week. (Patient not taking: No sig reported)   [DISCONTINUED] furosemide  (LASIX ) 20 MG tablet Take 1 tablet (20 mg total) by mouth daily as needed.   [DISCONTINUED] lidocaine  (LIDODERM ) 5 % Place 1 patch onto the skin daily. Remove & Discard patch within 12 hours or as directed by MD (Patient not taking: Reported on 10/02/2024)   [DISCONTINUED] MYRBETRIQ 25 MG TB24 tablet Take 25 mg by mouth daily.  (Patient not taking: Reported on 10/05/2024)   [DISCONTINUED] nitroGLYCERIN  (NITROSTAT ) 0.4 MG SL tablet Place 1 tablet (0.4 mg total) under the tongue every 5 (five) minutes as needed for up to 25 days for chest pain.   [DISCONTINUED] potassium chloride  SA (KLOR-CON  M20) 20 MEQ tablet TAKE 1 TABLET BY MOUTH EVERY DAY (Patient not taking: No sig reported)   [DISCONTINUED] Probiotic Product (ALIGN PO) Take by mouth. (Patient not taking: Reported on 10/05/2024)   [DISCONTINUED] propranolol  (INDERAL ) 20 MG tablet Take 1 tablet (20 mg total) by mouth 3 (three) times daily.   [DISCONTINUED] rosuvastatin  (CRESTOR ) 10 MG tablet TAKE 1 TABLET BY MOUTH EVERY DAY   [DISCONTINUED] traMADol (ULTRAM) 50 MG tablet Take 50 mg by mouth every 6 (six) hours as needed. (Patient not taking: Reported on 10/02/2024)   [DISCONTINUED] VITAMIN D  PO Take 1 capsule by mouth every other day. In the morning   [DISCONTINUED] acetaminophen  (TYLENOL ) tablet 650 mg    [DISCONTINUED] aspirin  EC tablet 81 mg    [DISCONTINUED] carvedilol  (COREG ) tablet 12.5 mg    [DISCONTINUED] carvedilol  (COREG ) tablet 6.25 mg    [DISCONTINUED] cholecalciferol  (VITAMIN D3) 25 MCG (1000 UNIT) tablet 1,000 Units    [DISCONTINUED] enoxaparin  (LOVENOX ) injection 40 mg    [DISCONTINUED] free water  500 mL    [DISCONTINUED] free water  500 mL    [DISCONTINUED] furosemide  (LASIX ) tablet 20 mg    [DISCONTINUED] gabapentin  (NEURONTIN ) capsule 300 mg    [DISCONTINUED] hydrALAZINE  (APRESOLINE ) injection 5 mg    [DISCONTINUED] isosorbide  mononitrate (IMDUR ) 24 hr tablet 120 mg    [DISCONTINUED] morphine  (PF) 2 MG/ML injection 1 mg    [DISCONTINUED] nitroGLYCERIN  (NITROSTAT ) SL tablet 0.4 mg    [DISCONTINUED] ondansetron  (ZOFRAN ) injection 4 mg    [DISCONTINUED] oxyCODONE -acetaminophen  (PERCOCET/ROXICET) 5-325 MG per tablet 1 tablet    [DISCONTINUED] pantoprazole  (PROTONIX ) EC tablet 40 mg    [DISCONTINUED] ranolazine  (RANEXA ) 12 hr tablet 500 mg     [DISCONTINUED] rosuvastatin  (CRESTOR ) tablet 20 mg    [DISCONTINUED] sodium chloride  flush (NS) 0.9 % injection 3  mL    [DISCONTINUED] sodium chloride  flush (NS) 0.9 % injection 3 mL    [DISCONTINUED] ticagrelor  (BRILINTA ) tablet 90 mg    No facility-administered encounter medications on file as of 10/09/2024.    Social History: Social History   Tobacco Use   Smoking status: Never   Smokeless tobacco: Never  Vaping Use   Vaping status: Never Used  Substance Use Topics   Alcohol use: No   Drug use: No    Family Medical History: Family History  Problem Relation Age of Onset   Cardiomyopathy Mother    Coronary artery disease Father    Hypertension Brother    Prostate cancer Brother     Exam: Today's Vitals   10/09/24 0853  BP: 110/60  Weight: 177 lb (80.3 kg)  Height: 5' 5 (1.651 m)  PainSc: 8   PainLoc: Back    Body mass index is 29.45 kg/m.   General: A&O ROM of spine: WNL.  Palpation of spine: TTP over right intrascapular region.  Strength in the left lower extremity is EHL 5/5, Dorsiflexion 5/5, Plantar flexion 5/5, Hamstring 5/5, Quadricep 5/5, Iliopsoas 5/5. Strength in the right lower extremity is EHL 5/5, Dorsiflexion 5/5, Plantar flexion 5/5, Hamstring 5/5, Quadricep 5/5, Iliopsoas 5/5. Reflexes are 1+ and symmetric at the patella and achilles.   Bilateral lower extremity sensation is intact to light touch.  Ambulates with a slowed antalgic gait   Imaging: 01/22/24 IMPRESSION: Similar treated compression deformities at T12, L1 and L2. Stable grade 1 anterolisthesis L4 on L5 with mild degenerative changes     Electronically Signed   By: Luke Bun M.D.   On: 02/05/2024 20:29  10/09/23 DEXA LUMBAR  SPINE L1- L4                   HIP L.FEM. NECK 10/09/23 0.554 -2.7                 L.TOTAL HIP 10/09/23 0.641 -2.5                FOREARM  L/R 33% 10/09/23 0.612 -1.4                 INTERPRETATION: OSTEOPOROSIS. LUMBAR SPINE NOT REPORTED DUE  TO PRIOR  SURGERY.     FRACTURE RISK ASSESSMENT (FRAX)  __7.8___  10 year risk of hip fracture.      ___25__  10 year risk of any  major fracture.   TREATMENT:   The National Osteoporosis Foundation (2014) Treatment Guidelines for:  Consider FDA-approved medical therapies in postmenopausal women and men  aged 77 years and older, based on the following:  A hip or vertebral (clinical or morphometric) fracture  T-score <= -2.5 at the femoral neck or spine after appropriate evaluation  to exclude secondary causes  Low bone mass (T-score between -1.0 and -2.5 at the femoral neck or spine)  and a 10-year probability of a hip fracture >= 3% or a 10-year probability  of a major osteoporosis-related fracture >= 20% based on the UA-adapted  WHO algorithm  Clinicians judgment and/or patient preferences may indicate treatment for  people with 10-year fracture probabilities above or below these levels  BMD should be monitored two years after initiating therapy and at two-year  intervals thereafter.   09/28/23 Thoracic and lumbar xrays.  There does not appear to be any acute fracture on thoracic x-rays.  Lumbar x-rays show interval T12, L1, and L2 kyphoplasties with a chronic L3 compression deformity.  There does not appear to be any other acute abnormalities. Formal radiology report pending.  Assessment and Plan: Amanda Lambert is a pleasant 79 y.o. female with a history of multiple vertebral fractures and kyphoplasties. She continues to have ongoing back pain that she feels is primarily muscular in nature.  She does not feel as though her pain is as sharp or severe as it has been in the past with fractures and is not significantly worse from her last visit.  We briefly discussed getting updated imaging however she would like to hold off on conservative management.  She would like to undergo dry needling if she is able to while taking her blood thinner.  I will reach out to [facility]  and inquire.  We also briefly discussed possible facet injections should she fail to improve with dry needling as well as additional imaging of her spine and possibly left hip as her pain does lateralize to the left lower back. I will see her back in 4-6 weeks to evaluate her response.   I spent a total of 30 minutes in both face-to-face and non-face-to-face activities for this visit on the date of this encounter including review of records, discussion of symptoms, order placement, and documentation.  Edsel Goods PA-C Neurosurgery  "

## 2024-10-14 ENCOUNTER — Other Ambulatory Visit: Payer: Self-pay | Admitting: *Deleted

## 2024-10-15 ENCOUNTER — Ambulatory Visit: Admitting: Cardiology

## 2024-10-15 NOTE — Progress Notes (Unsigned)
" °  Cardiology Office Note:  .   Date:  10/15/2024  ID:  Amanda Lambert, DOB 12-18-1945, MRN [id number] PCP: Cleotilde, Virginia  E, PA  [facility] Providers Cardiologist:  Gordy Bergamo, MD {  History of Present Illness: .   Amanda Lambert is a 79 y.o. female with history of CAD with DES to mLAD 2022, ISR to mLAD and balloon angioplasty 09/2023, LBBB, carotid artery stenosis, chronic HFpEF, hypertension, hyperlipidemia multiple vertebral fractures and kyphoplasties followed by neurosurgery.     CAD 05/2021 LHC with DES to M LAD 09/2024 admitted for unstable angina.  Reported chest pain to the left side of chest radiating bilateral rating to both arms and shoulders.  LHC with single-vessel ISR in the mid LAD.  No other obstructive disease.  Underwent successful balloon angioplasty to mid LAD.  EF normal.  Carotid artery disease 02/2024 Right ICA 40 to 59% stenosis.  Left ICA 1 to 39% stenosis.    Social history      Patient with prior stent placement to the mid LAD with recent admission for unstable angina 09/2024 where they underwent left heart catheterization demonstrating in-stent restenosis of the mid LAD stent status post successful balloon angioplasty.  Propranolol  was switched to Coreg .  Only had 1 episode of chest pain responsive to nitroglycerin  after procedure.  EF normal.  CAD Hyperlipidemia -2022 DES to mid LAD -09/2023 ISR to mid LAD status post balloon angioplasty.  EF normal.   Carotid artery stenosis 02/2024 Right ICA 40 to 59% stenosis.  Left ICA 1 to 39% stenosis.  Repeat carotid ultrasound 02/2025.  Chronic HFpEF  Hypertension    ROS: Denies: Chest pain, shortness of breath, orthopnea, peripheral edema, palpitations, syncope, decreased exercise capacity, fatigue, dizziness.   Studies Reviewed: .         Risk Assessment/Calculations:   {Does this patient have ATRIAL FIBRILLATION?:[phone]} No BP recorded.  {Refresh Note OR Click here to enter BP  :1}***        Physical Exam:   VS:  There were no vitals taken for this visit.   Wt Readings from Last 3 Encounters:  10/09/24 177 lb (80.3 kg)  10/08/24 179 lb 0.2 oz (81.2 kg)  10/02/24 180 lb (81.6 kg)    GEN: Well nourished, well developed in no acute distress NECK: No JVD; No carotid bruits CARDIAC: ***RRR, no murmurs, rubs, gallops RESPIRATORY:  Clear to auscultation without rales, wheezing or rhonchi  ABDOMEN: Soft, non-tender, non-distended EXTREMITIES:  No edema; No deformity   ASSESSMENT AND PLAN: .      {The patient has an active order for outpatient cardiac rehabilitation.   Please indicate if the patient is ready to start. Do NOT delete this.  It will auto delete.  Refresh note, then sign.              Click here to document readiness and see contraindications.  :1}  Cardiac Rehabilitation Eligibility Assessment      {Are you ordering a CV Procedure (e.g. stress test, cath, DCCV, TEE, etc)?   Press F2        :[id number]}  Dispo: ***  Signed, Thom LITTIE Sluder, PA-C  "

## 2024-10-16 ENCOUNTER — Emergency Department

## 2024-10-16 ENCOUNTER — Inpatient Hospital Stay
Admission: EM | Admit: 2024-10-16 | Discharge: 2024-10-18 | DRG: 312 | Disposition: A | Attending: Internal Medicine | Admitting: Internal Medicine

## 2024-10-16 ENCOUNTER — Encounter: Payer: Self-pay | Admitting: Internal Medicine

## 2024-10-16 DIAGNOSIS — E7800 Pure hypercholesterolemia, unspecified: Secondary | ICD-10-CM | POA: Diagnosis not present

## 2024-10-16 DIAGNOSIS — M810 Age-related osteoporosis without current pathological fracture: Secondary | ICD-10-CM | POA: Diagnosis present

## 2024-10-16 DIAGNOSIS — R55 Syncope and collapse: Secondary | ICD-10-CM | POA: Diagnosis present

## 2024-10-16 DIAGNOSIS — Z79899 Other long term (current) drug therapy: Secondary | ICD-10-CM

## 2024-10-16 DIAGNOSIS — I5032 Chronic diastolic (congestive) heart failure: Secondary | ICD-10-CM | POA: Diagnosis not present

## 2024-10-16 DIAGNOSIS — Z91040 Latex allergy status: Secondary | ICD-10-CM

## 2024-10-16 DIAGNOSIS — I447 Left bundle-branch block, unspecified: Secondary | ICD-10-CM | POA: Diagnosis present

## 2024-10-16 DIAGNOSIS — Z955 Presence of coronary angioplasty implant and graft: Secondary | ICD-10-CM

## 2024-10-16 DIAGNOSIS — Z7902 Long term (current) use of antithrombotics/antiplatelets: Secondary | ICD-10-CM

## 2024-10-16 DIAGNOSIS — I10 Essential (primary) hypertension: Secondary | ICD-10-CM | POA: Diagnosis present

## 2024-10-16 DIAGNOSIS — I2510 Atherosclerotic heart disease of native coronary artery without angina pectoris: Secondary | ICD-10-CM | POA: Diagnosis present

## 2024-10-16 DIAGNOSIS — K219 Gastro-esophageal reflux disease without esophagitis: Secondary | ICD-10-CM | POA: Diagnosis present

## 2024-10-16 DIAGNOSIS — R9431 Abnormal electrocardiogram [ECG] [EKG]: Secondary | ICD-10-CM | POA: Diagnosis not present

## 2024-10-16 DIAGNOSIS — I951 Orthostatic hypotension: Principal | ICD-10-CM | POA: Diagnosis present

## 2024-10-16 DIAGNOSIS — Z7982 Long term (current) use of aspirin: Secondary | ICD-10-CM

## 2024-10-16 DIAGNOSIS — Z91013 Allergy to seafood: Secondary | ICD-10-CM

## 2024-10-16 DIAGNOSIS — E663 Overweight: Secondary | ICD-10-CM | POA: Diagnosis present

## 2024-10-16 DIAGNOSIS — Z881 Allergy status to other antibiotic agents status: Secondary | ICD-10-CM

## 2024-10-16 DIAGNOSIS — N1831 Chronic kidney disease, stage 3a: Secondary | ICD-10-CM | POA: Diagnosis not present

## 2024-10-16 DIAGNOSIS — Z1152 Encounter for screening for COVID-19: Secondary | ICD-10-CM

## 2024-10-16 DIAGNOSIS — Z885 Allergy status to narcotic agent status: Secondary | ICD-10-CM

## 2024-10-16 DIAGNOSIS — I209 Angina pectoris, unspecified: Secondary | ICD-10-CM

## 2024-10-16 DIAGNOSIS — I130 Hypertensive heart and chronic kidney disease with heart failure and stage 1 through stage 4 chronic kidney disease, or unspecified chronic kidney disease: Secondary | ICD-10-CM | POA: Diagnosis present

## 2024-10-16 DIAGNOSIS — Z6829 Body mass index (BMI) 29.0-29.9, adult: Secondary | ICD-10-CM

## 2024-10-16 DIAGNOSIS — Z8249 Family history of ischemic heart disease and other diseases of the circulatory system: Secondary | ICD-10-CM

## 2024-10-16 DIAGNOSIS — R079 Chest pain, unspecified: Secondary | ICD-10-CM | POA: Diagnosis not present

## 2024-10-16 DIAGNOSIS — Z882 Allergy status to sulfonamides status: Secondary | ICD-10-CM

## 2024-10-16 DIAGNOSIS — I6523 Occlusion and stenosis of bilateral carotid arteries: Secondary | ICD-10-CM

## 2024-10-16 DIAGNOSIS — Z91030 Bee allergy status: Secondary | ICD-10-CM

## 2024-10-16 DIAGNOSIS — Z96611 Presence of right artificial shoulder joint: Secondary | ICD-10-CM | POA: Diagnosis present

## 2024-10-16 DIAGNOSIS — Z888 Allergy status to other drugs, medicaments and biological substances status: Secondary | ICD-10-CM

## 2024-10-16 DIAGNOSIS — I700 Atherosclerosis of aorta: Secondary | ICD-10-CM | POA: Diagnosis present

## 2024-10-16 LAB — BASIC METABOLIC PANEL WITH GFR
Anion gap: 11 (ref 5–15)
BUN: 14 mg/dL (ref 8–23)
CO2: 28 mmol/L (ref 22–32)
Calcium: 9.7 mg/dL (ref 8.9–10.3)
Chloride: 98 mmol/L (ref 98–111)
Creatinine, Ser: 1.06 mg/dL — ABNORMAL HIGH (ref 0.44–1.00)
GFR, Estimated: 54 mL/min — ABNORMAL LOW
Glucose, Bld: 113 mg/dL — ABNORMAL HIGH (ref 70–99)
Potassium: 3.7 mmol/L (ref 3.5–5.1)
Sodium: 137 mmol/L (ref 135–145)

## 2024-10-16 LAB — RESP PANEL BY RT-PCR (RSV, FLU A&B, COVID)  RVPGX2
Influenza A by PCR: NEGATIVE
Influenza B by PCR: NEGATIVE
Resp Syncytial Virus by PCR: NEGATIVE
SARS Coronavirus 2 by RT PCR: NEGATIVE

## 2024-10-16 LAB — CBC
HCT: 36.3 % (ref 36.0–46.0)
Hemoglobin: 12 g/dL (ref 12.0–15.0)
MCH: 29.1 pg (ref 26.0–34.0)
MCHC: 33.1 g/dL (ref 30.0–36.0)
MCV: 88.1 fL (ref 80.0–100.0)
Platelets: 334 K/uL (ref 150–400)
RBC: 4.12 MIL/uL (ref 3.87–5.11)
RDW: 12 % (ref 11.5–15.5)
WBC: 6.2 K/uL (ref 4.0–10.5)
nRBC: 0 % (ref 0.0–0.2)

## 2024-10-16 LAB — TROPONIN T, HIGH SENSITIVITY
Troponin T High Sensitivity: 18 ng/L (ref 0–19)
Troponin T High Sensitivity: 19 ng/L (ref 0–19)

## 2024-10-16 LAB — PRO BRAIN NATRIURETIC PEPTIDE: Pro Brain Natriuretic Peptide: 896 pg/mL — ABNORMAL HIGH

## 2024-10-16 LAB — MAGNESIUM: Magnesium: 1.5 mg/dL — ABNORMAL LOW (ref 1.7–2.4)

## 2024-10-16 MED ORDER — PANTOPRAZOLE SODIUM 40 MG PO TBEC
40.0000 mg | DELAYED_RELEASE_TABLET | Freq: Every day | ORAL | Status: DC
  Administered 2024-10-17 – 2024-10-18 (×2): 40 mg via ORAL
  Filled 2024-10-16 (×2): qty 1

## 2024-10-16 MED ORDER — TICAGRELOR 90 MG PO TABS
90.0000 mg | ORAL_TABLET | Freq: Two times a day (BID) | ORAL | Status: DC
  Administered 2024-10-17 – 2024-10-18 (×4): 90 mg via ORAL
  Filled 2024-10-16 (×4): qty 1

## 2024-10-16 MED ORDER — MAGNESIUM SULFATE 2 GM/50ML IV SOLN
2.0000 g | Freq: Once | INTRAVENOUS | Status: AC
  Administered 2024-10-17: 2 g via INTRAVENOUS
  Filled 2024-10-16: qty 50

## 2024-10-16 MED ORDER — MAGNESIUM CHLORIDE 64 MG PO TBEC
64.0000 mg | DELAYED_RELEASE_TABLET | Freq: Two times a day (BID) | ORAL | Status: DC
  Administered 2024-10-17 – 2024-10-18 (×3): 64 mg via ORAL
  Filled 2024-10-16 (×4): qty 1

## 2024-10-16 MED ORDER — ENOXAPARIN SODIUM 40 MG/0.4ML IJ SOSY
40.0000 mg | PREFILLED_SYRINGE | INTRAMUSCULAR | Status: DC
  Administered 2024-10-16 – 2024-10-17 (×2): 40 mg via SUBCUTANEOUS
  Filled 2024-10-16 (×2): qty 0.4

## 2024-10-16 MED ORDER — CYCLOBENZAPRINE HCL 10 MG PO TABS: 10.0000 mg | ORAL_TABLET | Freq: Three times a day (TID) | ORAL | Status: DC | PRN

## 2024-10-16 MED ORDER — ISOSORBIDE MONONITRATE ER 60 MG PO TB24
120.0000 mg | ORAL_TABLET | Freq: Every day | ORAL | Status: DC
  Administered 2024-10-17 – 2024-10-18 (×2): 120 mg via ORAL
  Filled 2024-10-16 (×2): qty 2

## 2024-10-16 MED ORDER — GABAPENTIN 300 MG PO CAPS
300.0000 mg | ORAL_CAPSULE | Freq: Two times a day (BID) | ORAL | Status: DC
  Administered 2024-10-17 – 2024-10-18 (×4): 300 mg via ORAL
  Filled 2024-10-16 (×4): qty 1

## 2024-10-16 MED ORDER — MORPHINE SULFATE (PF) 2 MG/ML IV SOLN: 2.0000 mg | INTRAVENOUS | Status: DC | PRN

## 2024-10-16 MED ORDER — DIPHENHYDRAMINE HCL 50 MG/ML IJ SOLN: 12.5000 mg | Freq: Three times a day (TID) | INTRAMUSCULAR | Status: DC | PRN

## 2024-10-16 MED ORDER — HYDRALAZINE HCL 20 MG/ML IJ SOLN: 5.0000 mg | INTRAMUSCULAR | Status: DC | PRN

## 2024-10-16 MED ORDER — NITROGLYCERIN 0.4 MG SL SUBL: 0.4000 mg | SUBLINGUAL_TABLET | SUBLINGUAL | Status: DC | PRN

## 2024-10-16 MED ORDER — ROSUVASTATIN CALCIUM 10 MG PO TABS
20.0000 mg | ORAL_TABLET | Freq: Every day | ORAL | Status: DC
  Administered 2024-10-17 – 2024-10-18 (×2): 20 mg via ORAL
  Filled 2024-10-16: qty 1
  Filled 2024-10-16 (×2): qty 2

## 2024-10-16 MED ORDER — CARVEDILOL 12.5 MG PO TABS
12.5000 mg | ORAL_TABLET | Freq: Two times a day (BID) | ORAL | Status: DC
  Administered 2024-10-17 – 2024-10-18 (×3): 12.5 mg via ORAL
  Filled 2024-10-16 (×3): qty 1

## 2024-10-16 MED ORDER — RANOLAZINE ER 500 MG PO TB12
500.0000 mg | ORAL_TABLET | Freq: Two times a day (BID) | ORAL | Status: DC
  Administered 2024-10-17 (×2): 500 mg via ORAL
  Filled 2024-10-16 (×3): qty 1

## 2024-10-16 MED ORDER — DM-GUAIFENESIN ER 30-600 MG PO TB12: 1.0000 | ORAL_TABLET | Freq: Two times a day (BID) | ORAL | Status: DC | PRN

## 2024-10-16 MED ORDER — ACETAMINOPHEN 325 MG PO TABS: 650.0000 mg | ORAL_TABLET | Freq: Four times a day (QID) | ORAL | Status: DC | PRN

## 2024-10-16 MED ORDER — ASPIRIN 81 MG PO CHEW
324.0000 mg | CHEWABLE_TABLET | Freq: Once | ORAL | Status: AC
  Administered 2024-10-16: 324 mg via ORAL
  Filled 2024-10-16: qty 4

## 2024-10-16 MED ORDER — ASPIRIN 81 MG PO TBEC
81.0000 mg | DELAYED_RELEASE_TABLET | Freq: Every day | ORAL | Status: DC
  Administered 2024-10-17 – 2024-10-18 (×2): 81 mg via ORAL
  Filled 2024-10-16 (×2): qty 1

## 2024-10-16 MED ORDER — SODIUM CHLORIDE 0.9 % IV SOLN: INTRAVENOUS | Status: AC

## 2024-10-16 MED ORDER — ALBUTEROL SULFATE (2.5 MG/3ML) 0.083% IN NEBU: 3.0000 mL | INHALATION_SOLUTION | RESPIRATORY_TRACT | Status: DC | PRN

## 2024-10-16 NOTE — H&P (Signed)
 " History and Physical    Ginelle Bays Staff FMW:[id number] DOB: 1946/04/06 DOA: 10/16/2024  Referring MD/NP/PA:   PCP: Cleotilde, Virginia  E, PA   Patient coming from:  The patient is coming from home.     Chief Complaint: near syncope and chest pain  HPI: Amanda Lambert is a 79 y.o. female with medical history significant of f CAD, s/p of stent to LAD, dCHF, LBBB, HTN, HLD, CKD-3a, IBS, varicose vein, bilateral carotid artery stenosis, osteoporosis on abaloparatide , who presents with near syncope and chest pain.  Pt was recently hospitalized from 1/11 - 1/14 due to unstable angina. Pt underwent LHC on 01/12 showed severe one-vessel CAD in mid LAD due to severe in-stent restenosis, status post successful balloon angioplasty. Per DC summery, if patient develops recurrent restenosis, cardiologist recommended treatment with drug-coated balloon.  Pt states that after she went home, she has intermittent mild chest discomfort.  This afternoon she developed dizziness, lightheadedness. She almost passed out, but did not fully lose consciousness.  She had 1 episode of left arm numbness which has resolved.  No facial droop or slurred speech.  Currently no unilateral weakness in extremities.  Patient states that after this near syncope episode, she had some chest pain, which is located substernal area, pressure-like, mild, nonradiating.  Her chest pain has resolved currently.  She has nausea, no vomiting, diarrhea or abdominal pain.  No symptoms UTI.  Patient states she has cold-like symptoms, including runny nose, congestion and mild sore throat.   Data reviewed independently and ED Course: pt was found to have troponin 18 - > 19, WBC 6.2, stable renal function, negative PCR for COVID, flu and RSV, magnesium  1.5, normal potassium 3.7 and a normal phosphorus of 2.9.  Temperature normal, blood pressure 175/88, heart rate 71, RR 18, oxygen saturation 100% on room air.  Chest x-ray negative.  Patient is placed in  telemetry bed for observation.   EKG: I have personally reviewed.  Sinus rhythm, QTc 510, left bundle blockade, poor R wave progression, LAD, anteroseptal infarction pattern.   Review of Systems:   General: no fevers, chills, no body weight gain, has fatigue.  Has flulike symptoms. HEENT: no blurry vision, hearing changes or sore throat Respiratory: no dyspnea, coughing, wheezing CV:  has chest pain, no palpitations GI: has nausea, no vomiting, abdominal pain, diarrhea, constipation GU: no dysuria, burning on urination, increased urinary frequency, hematuria  Ext: has mild leg edema Neuro: no unilateral weakness, numbness, or tingling, no vision change or hearing loss.  Has near syncope, dizziness Skin: no rash, no skin tear. MSK: No muscle spasm, no deformity, no limitation of range of movement in spin Heme: No easy bruising.  Travel history: No recent long distant travel.   Allergy: Allergies[1]  Past Medical History:  Diagnosis Date   Anemia    Angina pectoris    Aortic atherosclerosis    Arthritis    Bilateral carotid artery disease 10/15/2019   a.) carotid doppler 10/15/2019: 50-69% BICA; b.) carotid doppler 04/06/2020: 50-69% RICA, 1-15% LICA; c.) carotid doppler 11/03/2020 and 04/29/2021: 50-69% RICA, 16-49% LICA; d.) carotid doppler 03/02/2022: 50-69% BICA; e.) carotid doppler 03/14/2023: 50-69% RICA, 16-49% LICA   Biliary dyskinesia    CAD (coronary artery disease) 06/14/2021   a.) LHC 11/04/2019: 50% mLAD - med mgmt; b.) LHC/PCI 06/14/2021: 40% p-mLAD, 80% mLAD (3.0 x 30 mm Onyx Frontier DES; c.) LHC for FFR study: 07/19/2021: 40% pLAD; RFR 0.92, FFR 0.89, CFR 4.8, IMR 18 --> no significant  macro/microvascular disease   CKD (chronic kidney disease), stage III (HCC)    Complication of anesthesia    a.) PONV; b.) delayed emergence   Constipation    Diastolic dysfunction 10/15/2019   a.) TTE 10/15/2019: EF 50-55%, norm LAP, mild LA dil, mod MR, mild-mod TR, G1DD; b.)  TTE 05/19/2021: EF 50-55%, mild basal sep hypertrophy, norm LAP, triv MR/TR, G1DD   GERD (gastroesophageal reflux disease)    Hepatitis B 1975   Hypercholesteremia    Hypertension    Inguinal hernia    Irritable bowel syndrome without diarrhea    LBBB (left bundle branch block)    Leukopenia    Long term current use of aspirin     PONV (postoperative nausea and vomiting)    [facility] spotted fever 04/01/2012   adm. to hospital   Syncope 10/17/2019   Thrombocytopenia    Transaminitis    Trochanteric bursitis, right hip    Vaginal Pap smear, abnormal    Varicose veins of leg with edema, bilateral    Weakness     Past Surgical History:  Procedure Laterality Date   APPENDECTOMY  09/25/1961   CHOLECYSTECTOMY  04/26/2012   Procedure: LAPAROSCOPIC CHOLECYSTECTOMY WITH INTRAOPERATIVE CHOLANGIOGRAM;  Surgeon: Deward GORMAN Curvin DOUGLAS, MD;  Location: [facility];  Service: General;  Laterality: N/A;   COLONOSCOPY     CORONARY ANGIOGRAPHY N/A 07/19/2021   Procedure: CORONARY ANGIOGRAPHY (CATH LAB);  Surgeon: Ladona Heinz, MD;  Location[facility] INVASIVE CV LAB;  Service: Cardiovascular;  Laterality: N/A;   CORONARY BALLOON ANGIOPLASTY N/A 10/06/2024   Procedure: CORONARY BALLOON ANGIOPLASTY;  Surgeon: Darron Deatrice LABOR, MD;  Location: [facility] INVASIVE CV LAB;  Service: Cardiovascular;  Laterality: N/A;   CORONARY IMAGING/OCT N/A 06/14/2021   Procedure: INTRAVASCULAR IMAGING/OCT;  Surgeon: Ladona Heinz, MD;  Location: [facility] INVASIVE CV LAB;  Service: Cardiovascular;  Laterality: N/A;   CORONARY IMAGING/OCT N/A 10/06/2024   Procedure: CORONARY IMAGING/OCT;  Surgeon: Darron Deatrice LABOR, MD;  Location: [facility] INVASIVE CV LAB;  Service: Cardiovascular;  Laterality: N/A;   CORONARY PRESSURE/FFR STUDY N/A 07/19/2021   Procedure: INTRAVASCULAR PRESSURE WIRE/FFR STUDY;  Surgeon: Ladona Heinz, MD;  Location: [facility] INVASIVE CV LAB;  Service: Cardiovascular;  Laterality: N/A;   CORONARY STENT INTERVENTION N/A 06/14/2021   Procedure:  CORONARY STENT INTERVENTION;  Surgeon: Ladona Heinz, MD;  Location: [facility] INVASIVE CV LAB;  Service: Cardiovascular;  Laterality: N/A;   DILATION AND CURETTAGE OF UTERUS  09/25/1980   for miscarriage   DILATION AND CURETTAGE, DIAGNOSTIC / THERAPEUTIC  09/25/1970   INSERTION OF MESH Right 05/03/2023   Procedure: INSERTION OF MESH;  Surgeon: Tye Millet, DO;  Location: [facility] ORS;  Service: General;  Laterality: Right;   IR KYPHO EA ADDL LEVEL THORACIC OR LUMBAR  09/05/2023   IR KYPHO LUMBAR INC FX REDUCE BONE BX UNI/BIL CANNULATION INC/IMAGING  09/05/2023   IR KYPHO THORACIC WITH BONE BIOPSY  09/05/2023   LEFT HEART CATH AND CORONARY ANGIOGRAPHY N/A 11/04/2019   Procedure: LEFT HEART CATH AND CORONARY ANGIOGRAPHY;  Surgeon: Ladona Heinz, MD;  Location: [facility] INVASIVE CV LAB;  Service: Cardiovascular;  Laterality: N/A;   LEFT HEART CATH AND CORONARY ANGIOGRAPHY N/A 06/14/2021   Procedure: LEFT HEART CATH AND CORONARY ANGIOGRAPHY;  Surgeon: Ladona Heinz, MD;  Location: [facility] INVASIVE CV LAB;  Service: Cardiovascular;  Laterality: N/A;   LEFT HEART CATH AND CORONARY ANGIOGRAPHY N/A 10/06/2024   Procedure: LEFT HEART CATH AND CORONARY ANGIOGRAPHY;  Surgeon: Darron Deatrice LABOR, MD;  Location: [facility] INVASIVE CV  LAB;  Service: Cardiovascular;  Laterality: N/A;   TONSILLECTOMY  1964  - approximate   TOTAL SHOULDER ARTHROPLASTY Right 01/28/2024   WRIST FRACTURE SURGERY  09/25/2006   right    Social History:  reports that she has never smoked. She has never used smokeless tobacco. She reports that she does not drink alcohol and does not use drugs.  Family History:  Family History  Problem Relation Age of Onset   Cardiomyopathy Mother    Coronary artery disease Father    Hypertension Brother    Prostate cancer Brother      Prior to Admission medications  Medication Sig Start Date End Date Taking? Authorizing Provider  Abaloparatide  3120 MCG/1.56ML SOPN Inject 80 mcg into the skin daily. 10/08/24   Lanetta Lingo, MD  aspirin  EC 81 MG tablet Take 81 mg by mouth daily. Swallow whole.    [provider]  carvedilol  (COREG ) 12.5 MG tablet Take 1 tablet (12.5 mg total) by mouth 2 (two) times daily with a meal. 10/08/24 11/07/24  Agbata, Tochukwu, MD  cyclobenzaprine  (FLEXERIL ) 10 MG tablet TAKE 1 TABLET BY MOUTH THREE TIMES A DAY AS NEEDED FOR MUSCLE SPASMS 03/23/24   Gregory Edsel Ruth, PA  EPINEPHrine  0.3 mg/0.3 mL IJ SOAJ injection Inject 0.3 mg into the muscle once as needed for anaphylaxis.    [provider]  esomeprazole (NEXIUM) 40 MG capsule Take 40 mg by mouth in the morning.    [provider]  furosemide  (LASIX ) 20 MG tablet Take 1 tablet (20 mg total) by mouth daily. 10/08/24 11/07/24  Lanetta Lingo, MD  gabapentin  (NEURONTIN ) 300 MG capsule Take 300 mg by mouth 2 (two) times daily. 02/14/21   [provider]  isosorbide  mononitrate (IMDUR ) 120 MG 24 hr tablet TAKE 1 TABLET BY MOUTH EVERY DAY 03/25/24   Jerilynn Lamarr HERO, NP  magnesium  30 MG tablet Take 30 mg by mouth 2 (two) times daily.    [provider]  nitroGLYCERIN  (NITROSTAT ) 0.4 MG SL tablet Place 1 tablet (0.4 mg total) under the tongue every 5 (five) minutes as needed for chest pain. 10/08/24 11/07/24  Lanetta Lingo, MD  ondansetron  (ZOFRAN -ODT) 4 MG disintegrating tablet Take 1 tablet (4 mg total) by mouth every 8 (eight) hours as needed for nausea or vomiting. 01/29/24   Veta Palma, PA-C  potassium chloride  SA (KLOR-CON  M20) 20 MEQ tablet Take 1 tablet (20 mEq total) by mouth daily. 10/08/24 11/07/24  Lanetta Lingo, MD  ranolazine  (RANEXA ) 500 MG 12 hr tablet Take 1 tablet (500 mg total) by mouth 2 (two) times daily. 10/08/24 11/07/24  Lanetta Lingo, MD  rosuvastatin  (CRESTOR ) 20 MG tablet Take 1 tablet (20 mg total) by mouth daily. 10/09/24 11/08/24  Lanetta Lingo, MD  ticagrelor  (BRILINTA ) 90 MG TABS tablet Take 1 tablet (90 mg total) by mouth 2 (two) times daily. 10/08/24  11/07/24  Agbata, Tochukwu, MD  Vitamin D , Ergocalciferol , (DRISDOL) 1.25 MG (50000 UNIT) CAPS capsule Take 1 capsule by mouth every 7 (seven) days.    [provider]    Physical Exam: Vitals:   10/16/24 1657 10/16/24 2045 10/16/24 2218 10/17/24 0040  BP: (!) 175/88 (!) 178/92  139/83  Pulse: 71 75  71  Resp: 18 18  15   Temp: 97.6 F (36.4 C)  98 F (36.7 C)   TempSrc: Oral  Oral   SpO2: 100% 99%  98%  Weight:      Height:       General: Not  in acute distress HEENT:       Eyes: PERRL, EOMI, no jaundice       ENT: No discharge from the ears and nose, no pharynx injection, no tonsillar enlargement.        Neck: No JVD, no bruit, no mass felt. Heme: No neck lymph node enlargement. Cardiac: S1/S2, RRR, No murmurs, No gallops or rubs. Respiratory: No rales, wheezing, rhonchi or rubs. GI: Soft, nondistended, nontender, no rebound pain, no organomegaly, BS present. GU: No hematuria Ext: has trace leg edema bilaterally. 1+DP/PT pulse bilaterally. Musculoskeletal: No joint deformities, No joint redness or warmth, no limitation of ROM in spin. Skin: No rashes.  Neuro: Alert, oriented X3, cranial nerves II-XII grossly intact, moves all extremities normally. Muscle strength 5/5 in all extremities, sensation to light touch intact. Brachial reflex 2+ bilaterally.  Psych: Patient is not psychotic, no suicidal or hemocidal ideation.  Labs on Admission: I have personally reviewed following labs and imaging studies  CBC: Recent Labs  Lab 10/16/24 1710  WBC 6.2  HGB 12.0  HCT 36.3  MCV 88.1  PLT 334   Basic Metabolic Panel: Recent Labs  Lab 10/16/24 1710 10/16/24 2036  NA 137  --   K 3.7  --   CL 98  --   CO2 28  --   GLUCOSE 113*  --   BUN 14  --   CREATININE 1.06*  --   CALCIUM  9.7  --   MG  --  1.5*  PHOS  --  2.9   GFR: Estimated Creatinine Clearance: 46.5 mL/min (A) (by C-G formula based on SCr of 1.06 mg/dL (H)). Liver Function Tests: No results for  input(s): AST, ALT, ALKPHOS, BILITOT, PROT, ALBUMIN in the last 168 hours. No results for input(s): LIPASE, AMYLASE in the last 168 hours. No results for input(s): AMMONIA in the last 168 hours. Coagulation Profile: No results for input(s): INR, PROTIME in the last 168 hours. Cardiac Enzymes: No results for input(s): CKTOTAL, CKMB, CKMBINDEX, TROPONINI in the last 168 hours. BNP (last 3 results) Recent Labs    10/05/24 2218 10/16/24 2036  PROBNP 1,421.0* 896.0*   HbA1C: No results for input(s): HGBA1C in the last 72 hours. CBG: No results for input(s): GLUCAP in the last 168 hours. Lipid Profile: No results for input(s): CHOL, HDL, LDLCALC, TRIG, CHOLHDL, LDLDIRECT in the last 72 hours. Thyroid  Function Tests: No results for input(s): TSH, T4TOTAL, FREET4, T3FREE, THYROIDAB in the last 72 hours. Anemia Panel: No results for input(s): VITAMINB12, FOLATE, FERRITIN, TIBC, IRON, RETICCTPCT in the last 72 hours. Urine analysis:    Component Value Date/Time   COLORURINE ORANGE (A) 02/18/2024 1329   APPEARANCEUR CLOUDY (A) 02/18/2024 1329   LABSPEC  02/18/2024 1329    TEST NOT REPORTED DUE TO COLOR INTERFERENCE OF URINE PIGMENT   PHURINE  02/18/2024 1329    TEST NOT REPORTED DUE TO COLOR INTERFERENCE OF URINE PIGMENT   GLUCOSEU (A) 02/18/2024 1329    TEST NOT REPORTED DUE TO COLOR INTERFERENCE OF URINE PIGMENT   HGBUR (A) 02/18/2024 1329    TEST NOT REPORTED DUE TO COLOR INTERFERENCE OF URINE PIGMENT   BILIRUBINUR moderate (A) 08/06/2024 1450   KETONESUR small (15) (A) 08/06/2024 1450   KETONESUR (A) 02/18/2024 1329    TEST NOT REPORTED DUE TO COLOR INTERFERENCE OF URINE PIGMENT   PROTEINUR (A) 02/18/2024 1329    TEST NOT REPORTED DUE TO COLOR INTERFERENCE OF URINE PIGMENT   UROBILINOGEN >=8.0 (A) 08/06/2024 1450  UROBILINOGEN 2.0 (H) 04/01/2012 0433   NITRITE Positive (A) 08/06/2024 1450   NITRITE (A)  02/18/2024 1329    TEST NOT REPORTED DUE TO COLOR INTERFERENCE OF URINE PIGMENT   LEUKOCYTESUR Large (3+) (A) 08/06/2024 1450   LEUKOCYTESUR (A) 02/18/2024 1329    TEST NOT REPORTED DUE TO COLOR INTERFERENCE OF URINE PIGMENT   Sepsis Labs: @LABRCNTIP (procalcitonin:4,lacticidven:4) ) Recent Results (from the past 240 hours)  Resp panel by RT-PCR (RSV, Flu A&B, Covid) Anterior Nasal Swab     Status: None   Collection Time: 10/16/24 10:38 PM   Specimen: Anterior Nasal Swab  Result Value Ref Range Status   SARS Coronavirus 2 by RT PCR NEGATIVE NEGATIVE Final    Comment: (NOTE) SARS-CoV-2 target nucleic acids are NOT DETECTED.  The SARS-CoV-2 RNA is generally detectable in upper respiratory specimens during the acute phase of infection. The lowest concentration of SARS-CoV-2 viral copies this assay can detect is 138 copies/mL. A negative result does not preclude SARS-Cov-2 infection and should not be used as the sole basis for treatment or other patient management decisions. A negative result may occur with  improper specimen collection/handling, submission of specimen other than nasopharyngeal swab, presence of viral mutation(s) within the areas targeted by this assay, and inadequate number of viral copies(<138 copies/mL). A negative result must be combined with clinical observations, patient history, and epidemiological information. The expected result is Negative.  Fact Sheet for Patients:  bloggercourse.com  Fact Sheet for Healthcare Providers:  seriousbroker.it  This test is no t yet approved or cleared by the [place]  FDA and  has been authorized for detection and/or diagnosis of SARS-CoV-2 by FDA under an Emergency Use Authorization (EUA). This EUA will remain  in effect (meaning this test can be used) for the duration of the COVID-19 declaration under Section 564(b)(1) of the Act, 21 U.S.C.section 360bbb-3(b)(1),  unless the authorization is terminated  or revoked sooner.       Influenza A by PCR NEGATIVE NEGATIVE Final   Influenza B by PCR NEGATIVE NEGATIVE Final    Comment: (NOTE) The Xpert Xpress SARS-CoV-2/FLU/RSV plus assay is intended as an aid in the diagnosis of influenza from Nasopharyngeal swab specimens and should not be used as a sole basis for treatment. Nasal washings and aspirates are unacceptable for Xpert Xpress SARS-CoV-2/FLU/RSV testing.  Fact Sheet for Patients: bloggercourse.com  Fact Sheet for Healthcare Providers: seriousbroker.it  This test is not yet approved or cleared by the [place]  FDA and has been authorized for detection and/or diagnosis of SARS-CoV-2 by FDA under an Emergency Use Authorization (EUA). This EUA will remain in effect (meaning this test can be used) for the duration of the COVID-19 declaration under Section 564(b)(1) of the Act, 21 U.S.C. section 360bbb-3(b)(1), unless the authorization is terminated or revoked.     Resp Syncytial Virus by PCR NEGATIVE NEGATIVE Final    Comment: (NOTE) Fact Sheet for Patients: bloggercourse.com  Fact Sheet for Healthcare Providers: seriousbroker.it  This test is not yet approved or cleared by the [place]  FDA and has been authorized for detection and/or diagnosis of SARS-CoV-2 by FDA under an Emergency Use Authorization (EUA). This EUA will remain in effect (meaning this test can be used) for the duration of the COVID-19 declaration under Section 564(b)(1) of the Act, 21 U.S.C. section 360bbb-3(b)(1), unless the authorization is terminated or revoked.  Performed at [facility], [address] [place], [place] [address]      Radiological Exams on Admission:  Assessment/Plan Principal Problem:   Near syncope Active Problems:   CAD (coronary artery disease)   Chest pain    Hypomagnesemia   HTN (hypertension)   Hypercholesteremia   Chronic diastolic CHF (congestive heart failure) (HCC)   Chronic kidney disease, stage 3a (HCC)   Prolonged QT interval   Overweight (BMI 25.0-29.9)   Assessment and Plan:  Near syncope: Patient states that she did not fully lose consciousness. Etiology is not clear. The differential diagnosis is broad, including vasovagal syncope, TIA/stroke, arrhythmia, ACS, orthostatic status.  Patient just had 2D echo 10/06/2024 which showed EF of 55 to 60% with grade 1 diastolic dysfunction.  Patient does not have focal neurodeficit on physical examination.  I ordered MRI of brain initially, but patient refused this test, so it was canceled.  - Place in tele bed for for obs - Orthostatic vital signs  - Frequent neuro checks  - hold lasix   - IVF: NS 75 cc/h x 6 hours - PT/OT eval and treat  CAD (coronary artery disease) and chest pain: pt underwent LHC on 01/12 showed severe one-vessel CAD in mid LAD due to severe in-stent restenosis, status post successful balloon angioplasty.  Patient has mild intermittent chest pain after discharge.  Today her chest pain started after she had near syncope episode.  Troponin negative x 2.  Her chest pain is resolved.  Low suspicions for restenosis. - As needed nitroglycerin  -  continue Asa, Brilinta , Crestor , Coreg , Imdur  and Ranexa    Hypomagnesemia: Mg 1.5 - Repleted magnesium  with magnesium  sulfate 2 g  HTN (hypertension) -IV hydralazine  as needed - Imdur , Coreg   Hypercholesteremia -Crestor   Chronic diastolic CHF (congestive heart failure) (HCC): 2D echo on 10/06/2024 showed EF of 55-60% with grade 1 diastolic dysfunction.  Patient has trace leg edema, no SOB, CHF seem to be compensated. -Hold Lasix  since patient is on IV fluid for near syncope - Check BNP --> 896  Chronic kidney disease, stage 3a [facility]): Renal function stable.  Recent baseline creatinine 1.14 on 10/08/2024.  Her creatinine is 1.06,  BUN 14, GFR 54 today. -Follow-up with BMP  Prolonged QT interval: QTc 510, may be due to hypomagnesemia. -Repleted magnesium  as above - Avoid using QT prolonging medications.  Overweight (BMI 25.0-29.9): Body weight 80.2 kg, BMI 29.42 - Encourage losing weight - Exercise and healthy diet  Flulike symptoms: -As needed albuterol  and Mucinex  -f/u RESP panel --> negative for COVID, flu and RSV     DVT ppx: SQ Lovenox   Code Status: Full code   Family Communication:   Yes, patient's son   at bed side.     Disposition Plan:  Anticipate discharge back to previous environment  Consults called:  none  Admission status and Level of care: Telemetry:    for obs    Dispo: The patient is from: Home              Anticipated d/c is to: Home              Anticipated d/c date is: 1 day              Patient currently is not medically stable to d/c.    Severity of Illness:  The appropriate patient status for this patient is OBSERVATION. Observation status is judged to be reasonable and necessary in order to provide the required intensity of service to ensure the patient's safety. The patient's presenting symptoms, physical exam findings, and initial radiographic and laboratory data in the context of their  medical condition is felt to place them at decreased risk for further clinical deterioration. Furthermore, it is anticipated that the patient will be medically stable for discharge from the hospital within 2 midnights of admission.        Date of Service 10/17/2024    Caleb Exon [facility]   If 7PM-7AM, please contact night-coverage [email or web address] 10/17/2024, 12:58 AM     [1]  Allergies Allergen Reactions   Ace Inhibitors Anaphylaxis    Angioedema.   Bee Venom Anaphylaxis and Other (See Comments)   Levofloxacin      Other Reaction(s): Not available  levofloxacin    Shellfish Allergy Hives, Rash and Dermatitis    splotches  Shellfish (substance)   Thorazine  [Chlorpromazine] Anaphylaxis   Wasp Venom Anaphylaxis   Levaquin  [Levofloxacin  In D5w] Nausea And Vomiting    Stated by patient she could not handle levaquin  even with antiemetics    Amlodipine      severe leg edema   Atropine Sulfate Other (See Comments)   Elemental Sulfur Itching        Glycopyrrolate  Other (See Comments)   Oxycodone  Other (See Comments)    hallucinations   Ramipril Other (See Comments)   Sulfa Antibiotics Other (See Comments)   Compazine [Prochlorperazine Edisylate] Anxiety   Latex Rash   "

## 2024-10-16 NOTE — ED Provider Notes (Signed)
 "  [facility] Provider Note    Event Date/Time   First MD Initiated Contact with Patient 10/16/24 1642     (approximate)   History   Chief Complaint Chest Pain   HPI  Amanda Lambert is a 79 y.o. female with past medical history of hypertension, hyperlipidemia, CAD, LBBB, HFpEF, and CKD who presents to the ED complaining of chest pain.  Patient reports that just prior to arrival she was walking to sit on the toilet when she suddenly started to have pressure in her chest rating down her left arm.  She then began to feel light headed and dizzy, like she was going to pass out.  She was able to lower herself onto the toilet, where she proceeded to lose consciousness.  She believes she was out for a few seconds, did not fall or hit her head.  She continued to have intermittent chest discomfort after the episode, ended up taking 3 doses of nitroglycerin  with resolution of chest pain.  She was recently admitted to the hospital for stenosis of her LAD stent requiring angioplasty, does state that she has had intermittent chest discomfort since then that was worse today.  She denies any fevers, cough, or difficulty breathing.     Physical Exam   Triage Vital Signs: ED Triage Vitals  Encounter Vitals Group     BP --      Girls Systolic BP Percentile --      Girls Diastolic BP Percentile --      Boys Systolic BP Percentile --      Boys Diastolic BP Percentile --      Pulse --      Resp --      Temp --      Temp src --      SpO2 --      Weight 10/16/24 1654 176 lb 12.9 oz (80.2 kg)     Height 10/16/24 1654 5' 5 (1.651 m)     Head Circumference --      Peak Flow --      Pain Score 10/16/24 1653 7     Pain Loc --      Pain Education --      Exclude from Growth Chart --     Most recent vital signs: Vitals:   10/16/24 1657  BP: (!) 175/88  Pulse: 71  Resp: 18  Temp: 97.6 F (36.4 C)  SpO2: 100%    Constitutional: Alert and oriented. Eyes: Conjunctivae  are normal. Head: Atraumatic. Nose: No congestion/rhinnorhea. Mouth/Throat: Mucous membranes are moist.  Cardiovascular: Normal rate, regular rhythm. Grossly normal heart sounds.  2+ radial pulses bilaterally. Respiratory: Normal respiratory effort.  No retractions. Lungs CTAB. Gastrointestinal: Soft and nontender. No distention. Musculoskeletal: No lower extremity tenderness nor edema.  Neurologic:  Normal speech and language. No gross focal neurologic deficits are appreciated.    ED Results / Procedures / Treatments   Labs (all labs ordered are listed, but only abnormal results are displayed) Labs Reviewed  BASIC METABOLIC PANEL WITH GFR - Abnormal; Notable for the following components:      Result Value   Glucose, Bld 113 (*)    Creatinine, Ser 1.06 (*)    GFR, Estimated 54 (*)    All other components within normal limits  CBC  TROPONIN T, HIGH SENSITIVITY     EKG  ED ECG REPORT I, Carlin Palin, the attending physician, personally viewed and interpreted this ECG.   Date: 10/16/2024  EKG Time: 16:56  Rate: 70  Rhythm: normal sinus rhythm  Axis: LAD  Intervals:left bundle branch block  ST&T Change: None  RADIOLOGY Chest x-ray reviewed and interpreted by me with no infiltrate, edema, or effusion.  PROCEDURES:  Critical Care performed: No  Procedures   MEDICATIONS ORDERED IN ED: Medications  aspirin  chewable tablet 324 mg (324 mg Oral Given 10/16/24 1802)     IMPRESSION / MDM / ASSESSMENT AND PLAN / ED COURSE  I reviewed the triage vital signs and the nursing notes.                              79 y.o. female with past medical history of hypertension, hyperlipidemia, CAD, CKD, HFpEF, and left branch block who presents to the ED complaining of intermittent chest discomfort associated with syncopal episode today.  Patient's presentation is most consistent with acute presentation with potential threat to life or bodily function.  Differential diagnosis  includes, but is not limited to, ACS, arrhythmia, PE, pneumonia, pneumothorax, musculoskeletal pain, GERD, anxiety, vasovagal episode, orthostatic hypotension.  Patient nontoxic-appearing and in no acute distress, vital signs are unremarkable.  EKG shows normal sinus rhythm and left normal branch block similar to previous with no ischemic changes.  Labs without significant anemia, leukocytosis, electrolyte abnormality, or AKI.  Initial troponin within normal limits and we will trend.  Low suspicion for PE given resolution of chest pain with reassuring vital signs.  Patient high risk for cardiac etiology for syncope, case discussed with hospitalist for admission.  The patient is on the cardiac monitor to evaluate for evidence of arrhythmia and/or significant heart rate changes.      FINAL CLINICAL IMPRESSION(S) / ED DIAGNOSES   Final diagnoses:  Nonspecific chest pain  Syncope, unspecified syncope type     Rx / DC Orders   ED Discharge Orders     None        Note:  This document was prepared using Dragon voice recognition software and may include unintentional dictation errors.   Willo Dunnings, MD 10/16/24 1815  "

## 2024-10-16 NOTE — ED Triage Notes (Signed)
 Pt to ED via EMS from home c/o CP. Pt began having CP and then proceeded to have a syncopal episode. Pt was able to lower self to the ground. Denies hitting head. Denies LOC. Does take a blood thinner. Pt has a stent that was repaired on 1/12 due to a 90% blockage. 3 nitroglycerin  and 1 aspirin  taken at home. Also endorses nausea and shob.    Cbg 131 O2 97% Hr 74 BP 140/80

## 2024-10-17 DIAGNOSIS — I25119 Atherosclerotic heart disease of native coronary artery with unspecified angina pectoris: Secondary | ICD-10-CM | POA: Diagnosis not present

## 2024-10-17 DIAGNOSIS — I447 Left bundle-branch block, unspecified: Secondary | ICD-10-CM | POA: Diagnosis not present

## 2024-10-17 DIAGNOSIS — I10 Essential (primary) hypertension: Secondary | ICD-10-CM | POA: Diagnosis not present

## 2024-10-17 DIAGNOSIS — R079 Chest pain, unspecified: Secondary | ICD-10-CM | POA: Diagnosis not present

## 2024-10-17 DIAGNOSIS — I25118 Atherosclerotic heart disease of native coronary artery with other forms of angina pectoris: Secondary | ICD-10-CM

## 2024-10-17 DIAGNOSIS — R55 Syncope and collapse: Secondary | ICD-10-CM

## 2024-10-17 DIAGNOSIS — R9431 Abnormal electrocardiogram [ECG] [EKG]: Secondary | ICD-10-CM | POA: Diagnosis not present

## 2024-10-17 LAB — CBC
HCT: 38.4 % (ref 36.0–46.0)
Hemoglobin: 12.2 g/dL (ref 12.0–15.0)
MCH: 29 pg (ref 26.0–34.0)
MCHC: 31.8 g/dL (ref 30.0–36.0)
MCV: 91.4 fL (ref 80.0–100.0)
Platelets: 320 K/uL (ref 150–400)
RBC: 4.2 MIL/uL (ref 3.87–5.11)
RDW: 12 % (ref 11.5–15.5)
WBC: 5.4 K/uL (ref 4.0–10.5)
nRBC: 0 % (ref 0.0–0.2)

## 2024-10-17 LAB — BASIC METABOLIC PANEL WITH GFR
Anion gap: 12 (ref 5–15)
BUN: 12 mg/dL (ref 8–23)
CO2: 25 mmol/L (ref 22–32)
Calcium: 9.3 mg/dL (ref 8.9–10.3)
Chloride: 100 mmol/L (ref 98–111)
Creatinine, Ser: 0.97 mg/dL (ref 0.44–1.00)
GFR, Estimated: 60 mL/min — ABNORMAL LOW
Glucose, Bld: 104 mg/dL — ABNORMAL HIGH (ref 70–99)
Potassium: 3.7 mmol/L (ref 3.5–5.1)
Sodium: 137 mmol/L (ref 135–145)

## 2024-10-17 LAB — PROTIME-INR
INR: 1.1 (ref 0.8–1.2)
Prothrombin Time: 15 s (ref 11.4–15.2)

## 2024-10-17 LAB — APTT: aPTT: 43 s — ABNORMAL HIGH (ref 24–36)

## 2024-10-17 LAB — PHOSPHORUS: Phosphorus: 2.9 mg/dL (ref 2.5–4.6)

## 2024-10-17 MED ORDER — SODIUM CHLORIDE 0.9 % IV BOLUS
1000.0000 mL | Freq: Once | INTRAVENOUS | Status: AC
  Administered 2024-10-17: 1000 mL via INTRAVENOUS

## 2024-10-17 MED ORDER — ABALOPARATIDE 3120 MCG/1.56ML ~~LOC~~ SOPN: 80.0000 ug | PEN_INJECTOR | Freq: Every day | SUBCUTANEOUS | Status: DC

## 2024-10-17 NOTE — ED Notes (Signed)
 I entered the room to find the pt sitting on the bedside commode. The pt was sitting upright on the commode alert and oriented x 4. The pt denied any complaints. The pt was warm, pink, and dry. The pt was asked why she didn't hit her call bell. The pt advised she didn't want to bother anyone since we are so busy. I told her she needed to please hit the call bell next time. The pt was assisted with standing and placing a clean brief back on the pt. The pt was assisted back onto the bed. The pt advised she felt SOB. Once the pt was assisted back in the bed the pt's work of breathing improved. Vitals were reassessed. The pt was provided a gown and her bed was cleaned and linen replaced. The pt was transported upstairs by ED tech Ana.

## 2024-10-17 NOTE — Hospital Course (Signed)
 79 y.o. female with medical history significant of f CAD, s/p of stent to LAD, dCHF, LBBB, HTN, HLD, CKD-3a, IBS, varicose vein, bilateral carotid artery stenosis, osteoporosis on abaloparatide , who presents with near syncope and chest pain.    Pt was recently hospitalized from 1/11 - 1/14 due to unstable angina. Pt underwent LHC on 01/12 showed severe one-vessel CAD in mid LAD due to severe in-stent restenosis, status post successful balloon angioplasty. Per DC summery, if patient develops recurrent restenosis, cardiologist recommended treatment with drug-coated balloon.    Assessment and Plan:   Near syncope and chest pain - Etiology unclear but differentials include orthostasis, vasovagal, arrhythmia, ACS, underlying CAD.  Mostly resolved however patient states she still felt a bit dizzy.  Noted orthostatics measured by OT today.  IV fluid bolus ordered.  IV fluids already on board.  Holding Lasix .  May be medication induced as well.   Atypical chest pain with CAD and recent balloon angioplasty of restenosed stent - Chest pain-initially resolved however this morning had mild recurrence, continued DOE.  Will reach out to cardiology to assess.  Troponins negative.  No abnormalities observed on telemetry so far.  Electrolytes WNL.  May be related to underlying orthostasis.   Hypomagnesemia - Resolved after replenishment.   Hypertension - Imdur , Coreg  on board.   Chronic HFpEF - Does not appear to be in acute exacerbation.

## 2024-10-17 NOTE — Consult Note (Signed)
 "  Cardiology Consultation:   Patient ID: Amanda Lambert; [id number]; 1946-02-02   Admit date: 10/16/2024 Date of Consult: 10/17/2024  Primary Care Provider: Cleotilde, [place]  E, PA Primary Cardiologist: [place] Primary Electrophysiologist:  None   Patient Profile:   Amanda Lambert is a 79 y.o. female with a hx of CAD status post PCI/DES to the LAD in 2022 status post recent PTCA of the mid LAD stent secondary to ISR in 09/2024, CKD stage III, HTN, HLD, mild bilateral carotid artery stenosis, and LBBB who is being seen today for the evaluation of chest pain and near syncope at the request of Dr. Arlon.  History of Present Illness:   Ms. Baskin underwent nuclear stress test in 09/2019 that was low risk with preserved LV systolic function.  Subsequent LHC in 10/2019 showed nonobstructive CAD with 50% mid LAD stenosis with medical therapy recommended.  In the setting of recurrent angina she underwent LHC in 05/2021 that showed proximal to mid LAD 40% stenosis along with mid LAD 80% stenosis status post successful PCI/DES.  In the setting of recurrent angina she underwent repeat LHC in 06/2021 showed a widely patent previously placed stent with no obstructive CAD and negative workup for coronary microvascular dysfunction.   She was recently admitted to the hospital on 10/05/2024 with unstable angina and uncontrolled hypertension.  Troponin was minimally elevated at 24.  proBNP 1421.  She underwent LHC on 10/06/2024 that demonstrated severe 1 vessel CAD due to severe she underwent LHC on 10/06/2024 that demonstrated severe 1 vessel CAD due to severe in-stent restenosis of the mid LAD with otherwise no obstructive disease.  She underwent successful PTCA to the mid LAD for in-stent restenosis.  OCT was performed prior and showed severe neointimal hyperplasia with no evidence of mechanical issues or stent underexpansion.  Restenosis did not extend outside of the stent distally.  Echo at that time showed an EF of 55  to 60%, no regional wall motion abnormalities, mild LVH, grade 1 diastolic dysfunction, normal RV systolic function and ventricular cavity size, mild mitral regurgitation, and aortic valve sclerosis without evidence of insufficiency or stenosis.   She was readmitted to [facility] on 10/16/2024 after having an episode of near syncope following positional change at home while trying to go to the bathroom with associated atypical chest pain including bilateral posterior shoulders and bilateral upper extremity discomfort.  Symptoms were described as a gradual black closing in.  Symptoms did not feel similar to what she has experienced leading up to initial PCI nor what she experienced leading up to admission earlier this month where she was found to have ISR.  No frank syncope.  She believes ranolazine  has contributed to her symptoms, particularly xerostomia.  She ended up taking 3 SL NTG and presented to [facility].  Initial BP elevated at 175/88 trending to the 140s mmHg systolic with heart rates in the 60s to 90s bpm.  Oxygen saturation 100% on room air.  Afebrile.  EKG showed NSR, 70 bpm, and known LBBB.  High-sensitivity troponin negative x 2.  proBNP 896, improved from 1421, 12-days prior, CBC unremarkable.  Chest x-ray with no active cardiopulmonary disease.  In the ER she received aspirin  324 mg x 1 and was continued on PTA medications including DAPT, carvedilol , Imdur , rosuvastatin , and ranolazine .  Orthostatics in the ER showed BP trending from 166 to 140 mmHg systolic when transitioning from sitting to standing.  Repeat orthostatics with physical therapy this morning shows stable BP ranging from the 130s  to 140s with positional changes except for BP at 3-minute standing dropping to 86/48.  This was associated with near syncope and bilateral shoulder discomfort.  Currently feels well and is without symptoms of angina or cardiac decompensation.    Past Medical History:  Diagnosis Date   Anemia    Angina pectoris     Aortic atherosclerosis    Arthritis    Bilateral carotid artery disease 10/15/2019   a.) carotid doppler 10/15/2019: 50-69% BICA; b.) carotid doppler 04/06/2020: 50-69% RICA, 1-15% LICA; c.) carotid doppler 11/03/2020 and 04/29/2021: 50-69% RICA, 16-49% LICA; d.) carotid doppler 03/02/2022: 50-69% BICA; e.) carotid doppler 03/14/2023: 50-69% RICA, 16-49% LICA   Biliary dyskinesia    CAD (coronary artery disease) 06/14/2021   a.) LHC 11/04/2019: 50% mLAD - med mgmt; b.) LHC/PCI 06/14/2021: 40% p-mLAD, 80% mLAD (3.0 x 30 mm Onyx Frontier DES; c.) LHC for FFR study: 07/19/2021: 40% pLAD; RFR 0.92, FFR 0.89, CFR 4.8, IMR 18 --> no significant macro/microvascular disease   CKD (chronic kidney disease), stage III (HCC)    Complication of anesthesia    a.) PONV; b.) delayed emergence   Constipation    Diastolic dysfunction 10/15/2019   a.) TTE 10/15/2019: EF 50-55%, norm LAP, mild LA dil, mod MR, mild-mod TR, G1DD; b.) TTE 05/19/2021: EF 50-55%, mild basal sep hypertrophy, norm LAP, triv MR/TR, G1DD   GERD (gastroesophageal reflux disease)    Hepatitis B 1975   Hypercholesteremia    Hypertension    Inguinal hernia    Irritable bowel syndrome without diarrhea    LBBB (left bundle branch block)    Leukopenia    Long term current use of aspirin     PONV (postoperative nausea and vomiting)    [facility] spotted fever 04/01/2012   adm. to hospital   Syncope 10/17/2019   Thrombocytopenia    Transaminitis    Trochanteric bursitis, right hip    Vaginal Pap smear, abnormal    Varicose veins of leg with edema, bilateral    Weakness     Past Surgical History:  Procedure Laterality Date   APPENDECTOMY  09/25/1961   CHOLECYSTECTOMY  04/26/2012   Procedure: LAPAROSCOPIC CHOLECYSTECTOMY WITH INTRAOPERATIVE CHOLANGIOGRAM;  Surgeon: Deward GORMAN Curvin DOUGLAS, MD;  Location: [facility];  Service: General;  Laterality: N/A;   COLONOSCOPY     CORONARY ANGIOGRAPHY N/A 07/19/2021   Procedure: CORONARY  ANGIOGRAPHY (CATH LAB);  Surgeon: Ladona Heinz, MD;  Location: [facility] INVASIVE CV LAB;  Service: Cardiovascular;  Laterality: N/A;   CORONARY BALLOON ANGIOPLASTY N/A 10/06/2024   Procedure: CORONARY BALLOON ANGIOPLASTY;  Surgeon: Darron Deatrice LABOR, MD;  Location: [facility] INVASIVE CV LAB;  Service: Cardiovascular;  Laterality: N/A;   CORONARY IMAGING/OCT N/A 06/14/2021   Procedure: INTRAVASCULAR IMAGING/OCT;  Surgeon: Ladona Heinz, MD;  Location: [facility] INVASIVE CV LAB;  Service: Cardiovascular;  Laterality: N/A;   CORONARY IMAGING/OCT N/A 10/06/2024   Procedure: CORONARY IMAGING/OCT;  Surgeon: Darron Deatrice LABOR, MD;  Location: [facility] INVASIVE CV LAB;  Service: Cardiovascular;  Laterality: N/A;   CORONARY PRESSURE/FFR STUDY N/A 07/19/2021   Procedure: INTRAVASCULAR PRESSURE WIRE/FFR STUDY;  Surgeon: Ladona Heinz, MD;  Location: [facility] INVASIVE CV LAB;  Service: Cardiovascular;  Laterality: N/A;   CORONARY STENT INTERVENTION N/A 06/14/2021   Procedure: CORONARY STENT INTERVENTION;  Surgeon: Ladona Heinz, MD;  Location: [facility] INVASIVE CV LAB;  Service: Cardiovascular;  Laterality: N/A;   DILATION AND CURETTAGE OF UTERUS  09/25/1980   for miscarriage   DILATION AND CURETTAGE, DIAGNOSTIC / THERAPEUTIC  09/25/1970  INSERTION OF MESH Right 05/03/2023   Procedure: INSERTION OF MESH;  Surgeon: Tye Millet, DO;  Location: [facility] ORS;  Service: General;  Laterality: Right;   IR KYPHO EA ADDL LEVEL THORACIC OR LUMBAR  09/05/2023   IR KYPHO LUMBAR INC FX REDUCE BONE BX UNI/BIL CANNULATION INC/IMAGING  09/05/2023   IR KYPHO THORACIC WITH BONE BIOPSY  09/05/2023   LEFT HEART CATH AND CORONARY ANGIOGRAPHY N/A 11/04/2019   Procedure: LEFT HEART CATH AND CORONARY ANGIOGRAPHY;  Surgeon: Ladona Heinz, MD;  Location: [facility] INVASIVE CV LAB;  Service: Cardiovascular;  Laterality: N/A;   LEFT HEART CATH AND CORONARY ANGIOGRAPHY N/A 06/14/2021   Procedure: LEFT HEART CATH AND CORONARY ANGIOGRAPHY;  Surgeon: Ladona Heinz, MD;  Location: [facility] INVASIVE CV LAB;   Service: Cardiovascular;  Laterality: N/A;   LEFT HEART CATH AND CORONARY ANGIOGRAPHY N/A 10/06/2024   Procedure: LEFT HEART CATH AND CORONARY ANGIOGRAPHY;  Surgeon: Darron Deatrice LABOR, MD;  Location: [facility] INVASIVE CV LAB;  Service: Cardiovascular;  Laterality: N/A;   TONSILLECTOMY  1964  - approximate   TOTAL SHOULDER ARTHROPLASTY Right 01/28/2024   WRIST FRACTURE SURGERY  09/25/2006   right     Home Meds: Prior to Admission medications  Medication Sig Start Date End Date Taking? Authorizing Provider  Abaloparatide  3120 MCG/1.56ML SOPN Inject 80 mcg into the skin daily. 10/08/24  Yes Agbata, Tochukwu, MD  aspirin  EC 81 MG tablet Take 81 mg by mouth daily. Swallow whole.   Yes [provider]  carvedilol  (COREG ) 12.5 MG tablet Take 1 tablet (12.5 mg total) by mouth 2 (two) times daily with a meal. 10/08/24 11/07/24 Yes Agbata, Tochukwu, MD  cyclobenzaprine  (FLEXERIL ) 10 MG tablet TAKE 1 TABLET BY MOUTH THREE TIMES A DAY AS NEEDED FOR MUSCLE SPASMS 03/23/24  Yes Gregory Edsel Ruth, PA  EPINEPHrine  0.3 mg/0.3 mL IJ SOAJ injection Inject 0.3 mg into the muscle once as needed for anaphylaxis.   Yes [provider]  esomeprazole (NEXIUM) 40 MG capsule Take 40 mg by mouth in the morning.   Yes [provider]  furosemide  (LASIX ) 20 MG tablet Take 1 tablet (20 mg total) by mouth daily. 10/08/24 11/07/24 Yes Agbata, Tochukwu, MD  gabapentin  (NEURONTIN ) 300 MG capsule Take 300 mg by mouth 2 (two) times daily. 02/14/21  Yes [provider]  isosorbide  mononitrate (IMDUR ) 120 MG 24 hr tablet TAKE 1 TABLET BY MOUTH EVERY DAY 03/25/24  Yes Jerilynn Lamarr HERO, NP  magnesium  30 MG tablet Take 30 mg by mouth 2 (two) times daily.   Yes [provider]  nitroGLYCERIN  (NITROSTAT ) 0.4 MG SL tablet Place 1 tablet (0.4 mg total) under the tongue every 5 (five) minutes as needed for chest pain. 10/08/24 11/07/24 Yes Agbata, Tochukwu, MD  ondansetron  (ZOFRAN -ODT) 4 MG disintegrating  tablet Take 1 tablet (4 mg total) by mouth every 8 (eight) hours as needed for nausea or vomiting. 01/29/24  Yes Veta Palma, PA-C  potassium chloride  SA (KLOR-CON  M20) 20 MEQ tablet Take 1 tablet (20 mEq total) by mouth daily. 10/08/24 11/07/24 Yes Agbata, Tochukwu, MD  ranolazine  (RANEXA ) 500 MG 12 hr tablet Take 1 tablet (500 mg total) by mouth 2 (two) times daily. 10/08/24 11/07/24 Yes Agbata, Tochukwu, MD  rosuvastatin  (CRESTOR ) 20 MG tablet Take 1 tablet (20 mg total) by mouth daily. 10/09/24 11/08/24 Yes Agbata, Tochukwu, MD  ticagrelor  (BRILINTA ) 90 MG TABS tablet Take 1 tablet (90 mg total) by mouth 2 (two) times daily. 10/08/24 11/07/24 Yes Lanetta Lingo, MD  Vitamin D , Ergocalciferol , (DRISDOL) 1.25 MG (50000 UNIT) CAPS capsule Take 1 capsule by mouth every 7 (seven) days.   Yes [provider]    Inpatient Medications: Scheduled Meds:  aspirin  EC  81 mg Oral Daily   carvedilol   12.5 mg Oral BID WC   enoxaparin  (LOVENOX ) injection  40 mg Subcutaneous Q24H   gabapentin   300 mg Oral BID   isosorbide  mononitrate  120 mg Oral Daily   magnesium  chloride  64 mg Oral BID   pantoprazole   40 mg Oral Daily   ranolazine   500 mg Oral BID   rosuvastatin   20 mg Oral Daily   ticagrelor   90 mg Oral BID   Continuous Infusions:  sodium chloride      PRN Meds: acetaminophen , albuterol , cyclobenzaprine , dextromethorphan-guaiFENesin , diphenhydrAMINE , hydrALAZINE , morphine  injection, nitroGLYCERIN   Allergies:  Allergies[1]  Social History:   Social History   Socioeconomic History   Marital status: Widowed    Spouse name: Not on file   Number of children: 1   Years of education: Not on file   Highest education level: Not on file  Occupational History   Not on file  Tobacco Use   Smoking status: Never   Smokeless tobacco: Never  Vaping Use   Vaping status: Never Used  Substance and Sexual Activity   Alcohol use: No   Drug use: No   Sexual activity: Not Currently  Other  Topics Concern   Not on file  Social History Narrative   Lives alone. Has small dog as pet.   Social Drivers of Health   Tobacco Use: Low Risk (10/16/2024)   Patient History    Smoking Tobacco Use: Never    Smokeless Tobacco Use: Never    Passive Exposure: Not on file  Financial Resource Strain: Not on file  Food Insecurity: No Food Insecurity (10/06/2024)   Epic    Worried About Programme Researcher, Broadcasting/film/video in the Last Year: Never true    Ran Out of Food in the Last Year: Never true  Transportation Needs: No Transportation Needs (10/06/2024)   Epic    Lack of Transportation (Medical): No    Lack of Transportation (Non-Medical): No  Physical Activity: Not on file  Stress: Not on file  Social Connections: Moderately Integrated (10/06/2024)   Social Connection and Isolation Panel    Frequency of Communication with Friends and Family: Twice a week    Frequency of Social Gatherings with Friends and Family: Twice a week    Attends Religious Services: 1 to 4 times per year    Active Member of [employer] or Organizations: Yes    Attends Banker Meetings: 1 to 4 times per year    Marital Status: Widowed  Intimate Partner Violence: Not At Risk (10/06/2024)   Epic    Fear of Current or Ex-Partner: No    Emotionally Abused: No    Physically Abused: No    Sexually Abused: No  Depression (PHQ2-9): Not on file  Alcohol Screen: Not on file  Housing: Low Risk (10/06/2024)   Epic    Unable to Pay for Housing in the Last Year: No    Number of Times Moved in the Last Year: 0    Homeless in the Last Year: No  Utilities: Not At Risk (10/06/2024)   Epic    Threatened with loss of utilities: No  Health Literacy: Not on file     Family History:   Family History  Problem Relation Age of Onset  Cardiomyopathy Mother    Coronary artery disease Father    Hypertension Brother    Prostate cancer Brother     ROS:  Review of Systems  Constitutional:  Positive for malaise/fatigue. Negative for  chills, diaphoresis, fever and weight loss.  HENT:  Positive for congestion.   Eyes:  Negative for discharge and redness.  Respiratory:  Negative for cough, sputum production, shortness of breath and wheezing.   Cardiovascular:  Positive for chest pain. Negative for palpitations, orthopnea, claudication, leg swelling and PND.  Gastrointestinal:  Negative for abdominal pain, heartburn, nausea and vomiting.  Musculoskeletal:  Negative for falls and myalgias.  Skin:  Negative for rash.  Neurological:  Positive for dizziness and weakness. Negative for tingling, tremors, sensory change, speech change, focal weakness and loss of consciousness.       Near syncope  Endo/Heme/Allergies:  Does not bruise/bleed easily.  Psychiatric/Behavioral:  Negative for substance abuse. The patient is not nervous/anxious.   All other systems reviewed and are negative.     Physical Exam/Data:   Vitals:   10/17/24 0515 10/17/24 0745 10/17/24 0807 10/17/24 0900  BP: (!) 153/62 (!) 112/101 (!) 145/70   Pulse: 67 97 67 72  Resp: 20 (!) 22 18   Temp: 97.8 F (36.6 C)  (!) 97.5 F (36.4 C)   TempSrc:      SpO2: 100% 100% 97%   Weight:      Height:        Intake/Output Summary (Last 24 hours) at 10/17/2024 1123 Last data filed at 10/17/2024 0139 Gross per 24 hour  Intake 50 ml  Output --  Net 50 ml   Filed Weights   10/16/24 1654  Weight: 80.2 kg   Body mass index is 29.42 kg/m.   Physical Exam: General: Well developed, well nourished, in no acute distress. Head: Normocephalic, atraumatic, sclera non-icteric, no xanthomas, nares without discharge.  Neck: Negative for carotid bruits. JVD not elevated. Lungs: Clear bilaterally to auscultation without wheezes, rales, or rhonchi. Breathing is unlabored. Heart: RRR with S1 S2. No murmurs, rubs, or gallops appreciated. Abdomen: Soft, non-tender, non-distended with normoactive bowel sounds. No hepatomegaly. No rebound/guarding. No obvious abdominal  masses. Msk:  Strength and tone appear normal for age. Extremities: No clubbing or cyanosis. No edema. Distal pedal pulses are 2+ and equal bilaterally. Neuro: Alert and oriented X 3. No facial asymmetry. No focal deficit. Moves all extremities spontaneously. Psych:  Responds to questions appropriately with a normal affect.   EKG:  The EKG was personally reviewed and demonstrates: NSR, 70 bpm, LBBB  Telemetry:  Telemetry was personally reviewed and demonstrates: SR  Weights: Filed Weights   10/16/24 1654  Weight: 80.2 kg    Relevant CV Studies:  2D echo 10/06/2024: 1. Left ventricular ejection fraction, by estimation, is 55 to 60%. The  left ventricle has normal function. The left ventricle has no regional  wall motion abnormalities. There is mild left ventricular hypertrophy.  Left ventricular diastolic parameters  are consistent with Grade I diastolic dysfunction (impaired relaxation).   2. Right ventricular systolic function is normal. The right ventricular  size is normal.   3. The mitral valve is degenerative. Mild mitral valve regurgitation. No  evidence of mitral stenosis.   4. The aortic valve was not well visualized. There is mild calcification  of the aortic valve. Aortic valve regurgitation is not visualized. Aortic  valve sclerosis/calcification is present, without any evidence of aortic  stenosis.  __________  LHC 10/06/2024:  Mid LAD-1 lesion is 10% stenosed.   Dist LAD lesion is 30% stenosed.   Mid LAD-2 lesion is 90% stenosed.   2nd Diag lesion is 30% stenosed.   Prox LAD to Mid LAD lesion is 30% stenosed.   Prox RCA lesion is 20% stenosed.   Balloon angioplasty was performed using a BALLOON TREK RX 2.5X12.   Post intervention, there is a 0% residual stenosis.   The left ventricular systolic function is normal.   LV end diastolic pressure is normal.   The left ventricular ejection fraction is 55-65% by visual estimate.   In the absence of any other  complications or medical issues, we expect the patient to be ready for discharge from an interventional cardiology perspective on 10/07/2024.   Recommend uninterrupted dual antiplatelet therapy with Aspirin  81mg  daily and Ticagrelor  90mg  twice daily for a minimum of 12 months (ACS-Class I recommendation).   1.  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  No other obstructive disease. 2.  Normal LV systolic function normal left ventricular end-diastolic pressure. 3.  Successful balloon angioplasty to the mid LAD for in-stent restenosis.  OCT was performed before and showed severe neointimal hyperplasia with no evidence of mechanical issues or stent underexpansion.  The restenosis did not extend outside the stent distally.   Recommendations: If she develops recurrent restenosis, recommend treatment with a drug-coated balloon. I switch propranolol  to carvedilol  for better blood pressure control. __________  [facility] 07/19/2021: LM: Smooth and normal. CX: Smooth and normal. LAD: Large vessel, gives origin to large D1.  Just after the origin of large D1 and a large septal perforator, there is a stent that was previously placed 06/14/2021, 3.0 x 30 mm Onyx is widely patent.  Prior to the stent, the proximal segment at the bifurcation of D1 and large septal perforator, there is a 30 40% stenosis. RFR was 0.92, FFR 0.89, CFR 4.8, IMR 18.  Findings do not suggest macrovascular or microvascular disease. RCA: Not studied, previously normal.   Impression: Patient symptoms of chest pain that is relieved with nitroglycerin , does not appear to be from cardiac etiology.  Will recommend performing a routine treadmill exercise stress test to see if there is any inducible ST segment changes.  Otherwise I have reassured the patient.  __________  [facility] 06/14/2021:   Prox LAD to Mid LAD lesion is 40% stenosed.   Mid LAD lesion is 80% stenosed.   A drug-eluting stent was  successfully placed using a STENT ONYX FRONTIER 3.0X30.   Post intervention, there is a 0% residual stenosis.   LV end diastolic pressure is normal.   Coronary angiography 06/14/2021: LV: 130/3, EDP 9 mmHg.  Ao 134/61, mean 91 mmHg.  There was no pressure gradient across the aortic valve. RCA: Large-caliber vessel.  Very mild luminal irregularity. Left main: Large vessel.  No significant disease. Circumflex: Large caliber vessel.  Gives origin to a small OM1 and continues as a large OM 2, no significant disease. LAD: Large vessel proximally, tapers off after the origin of D1.  There is a hazy 60 to 70% stenosis in the mid LAD extending all the way to the proximal LAD.  At the tightest segment appeared to be much more significant. Coronary intervention: OCT guided intervention.  The tightest segment in the mid LAD was 2.1 mm.  Distal reference was 6.4 mm.  Post PCI, the tightest segment measured 6.5 mm, proximal segment of the mid LAD stent measured 7.72  mm.  Overall stenosis reduced from 80% to 0% with TIMI-3 TIMI-3 flow. __________  2D echo 05/19/2021: Normal LV systolic function with visual EF 50-55%. Left ventricle cavity  is normal in size. Mild basal septal  hypertrophy. Poor endocardial  visualization to accurately comment on regional wall motion; grossly  normal global wall motion. Grade I (impaired) diastolic dysfunction,  normal LAP.  No significant valvular heart disease.  Compared to study 10/15/2019 Moderate MR and mild/moderate TR is now trace  otherwise no significant change.  __________  [facility] 11/04/2019: Mid LAD lesion is 50% stenosed. Otherwise normal coronary arteries LV end diastolic pressure is normal. Medical therapy for CAD. __________  Nuclear stress test 10/20/2019: Mild degree large extent fixed perfusion defect located in the mid anteroseptal wall, mid inferoseptal wall, basal anteroseptal wall and basal inferoseptal wall. This isc onsistent with LBBB, mild  ischemia cannot be completely excluded.  All segments of left ventricle demonstrated normal wall motion and thickening. Stress LV EF is normal 61%.  Low risk.  __________  2D echo 10/15/2019: Left ventricle cavity is normal in size. Moderate concentric hypertrophy  of the left ventricle. Normal global wall motion. Normal LV systolic  function with visual EF 50-55%. Doppler evidence of grade I (impaired)  diastolic dysfunction, normal LAP. Left atrial cavity is mildly dilated.  Moderate (Grade II) mitral regurgitation.  Structurally normal tricuspid valve.  Mild to moderate tricuspid  regurgitation.  No evidence of pulmonary hypertension.     Laboratory Data:  Chemistry Recent Labs  Lab 10/16/24 1710 10/17/24 0519  NA 137 137  K 3.7 3.7  CL 98 100  CO2 28 25  GLUCOSE 113* 104*  BUN 14 12  CREATININE 1.06* 0.97  CALCIUM  9.7 9.3  GFRNONAA 54* 60*  ANIONGAP 11 12    No results for input(s): PROT, ALBUMIN, AST, ALT, ALKPHOS, BILITOT in the last 168 hours. Hematology Recent Labs  Lab 10/16/24 1710 10/17/24 0519  WBC 6.2 5.4  RBC 4.12 4.20  HGB 12.0 12.2  HCT 36.3 38.4  MCV 88.1 91.4  MCH 29.1 29.0  MCHC 33.1 31.8  RDW 12.0 12.0  PLT 334 320   Cardiac EnzymesNo results for input(s): TROPONINI in the last 168 hours. No results for input(s): TROPIPOC in the last 168 hours.  BNP Recent Labs  Lab 10/16/24 2036  PROBNP 896.0*    DDimer No results for input(s): DDIMER in the last 168 hours.  Radiology/Studies:  DG Chest 2 View Result Date: 10/16/2024 IMPRESSION: No active cardiopulmonary disease. Electronically Signed   By: Suzen Dials M.D.   On: 10/16/2024 17:40    Assessment and Plan:   1. CAD involving the native coronary arteries with stable angina:  - Status post recent PTCA to mid LAD stent in the setting of ISR earlier this month  - Readmitted on 1/22 with atypical chest discomfort and near syncope, symptoms consistent with  presentation in 2022 following PCI - Ruled out  - She had prolonged atypical chest pain following PCI in 2022 with unrevealing cardiac workup including no evidence of CMD  - Currently, feels well and without symptoms concerning for angina or cardiac decompensation - She prefers to defer further cardiac testing at this time, ultimately if she continues to have symptoms may need to pursue Lexiscan  MPI versus repeat cardiac cath - She feels like ranolazine  has contributed to presentation, though this is unlikely contributing to orthostasis - Discontinue ranolazine  at her request - Continue DAPT with aspirin  81 mg and ticagrelor   90 mg twice daily  - Continue current antianginal therapy including carvedilol  12.5 mg twice daily, and Imdur  120 mg daily   - Cardiac rehab   2. Near syncope: - Appears to be consistent with orthostasis, possibly exacerbated by recent URI - No evidence of significant arrhythmia, high-grade AV block, symptomatic bradycardia, or prolonged pauses on telemetry during episodes - Monitor on telemetry while admitted - We did discuss de-escalation of pharmacotherapy (isosorbide  or carvedilol ), however she has been on these for quite some time and tolerated without issues previously - Encourage adequate hydration and slow positional changes  3. HTN with orthostasis:  - Blood pressure currently stable - Remains on carvedilol  12.5 mg twice daily and Imdur  120 mg daily  - Repeat orthostatics on 1/24  4. HLD:  - LDL 52 in 09/2024  - PTA rosuvastatin  20 mg   5. LBBB:  - Stable  - Echo with preserved LV systolic function      For questions or updates, please contact [facility] Please consult [email or web address] for contact info under Cardiology/STEMI.   Signed, Bernardino Bring, PA-C [facility] Pager: [phone] 10/17/2024, 11:23 AM       [1]  Allergies Allergen Reactions   Ace Inhibitors Anaphylaxis    Angioedema.   Bee Venom Anaphylaxis and Other (See  Comments)   Levofloxacin      Other Reaction(s): Not available  levofloxacin    Shellfish Allergy Hives, Rash and Dermatitis    splotches  Shellfish (substance)   Thorazine [Chlorpromazine] Anaphylaxis   Wasp Venom Anaphylaxis   Levaquin  [Levofloxacin  In D5w] Nausea And Vomiting    Stated by patient she could not handle levaquin  even with antiemetics    Amlodipine      severe leg edema   Atropine Sulfate Other (See Comments)   Elemental Sulfur Itching        Glycopyrrolate  Other (See Comments)   Oxycodone  Other (See Comments)    hallucinations   Ramipril Other (See Comments)   Sulfa Antibiotics Other (See Comments)   Compazine [Prochlorperazine Edisylate] Anxiety   Latex Rash   "

## 2024-10-17 NOTE — Progress Notes (Signed)
 " Progress Note   Patient: Amanda Lambert FMW:[id number] DOB: 1945/11/19 DOA: 10/16/2024  DOS: the patient was seen and examined on 10/17/2024   Brief hospital course:  79 y.o. female with medical history significant of f CAD, s/p of stent to LAD, dCHF, LBBB, HTN, HLD, CKD-3a, IBS, varicose vein, bilateral carotid artery stenosis, osteoporosis on abaloparatide , who presents with near syncope and chest pain.   Pt was recently hospitalized from 1/11 - 1/14 due to unstable angina. Pt underwent LHC on 01/12 showed severe one-vessel CAD in mid LAD due to severe in-stent restenosis, status post successful balloon angioplasty. Per DC summery, if patient develops recurrent restenosis, cardiologist recommended treatment with drug-coated balloon.   Assessment and Plan:  Near syncope and chest pain - Etiology unclear but differentials include orthostasis, vasovagal, arrhythmia, ACS, underlying CAD.  Mostly resolved however patient states she still felt a bit dizzy.  Noted orthostatics measured by OT today.  IV fluid bolus ordered.  IV fluids already on board.  Holding Lasix .  May be medication induced as well.  Atypical chest pain with CAD and recent balloon angioplasty of restenosed stent - Chest pain-initially resolved however this morning had mild recurrence, continued DOE.  Will reach out to cardiology to assess.  Troponins negative.  No abnormalities observed on telemetry so far.  Electrolytes WNL.  May be related to underlying orthostasis.  Hypomagnesemia - Resolved after replenishment.  Hypertension - Imdur , Coreg  on board.  Chronic HFpEF - Does not appear to be in acute exacerbation.   Subjective: Patient resting comfortably this morning.  States she feels better than yesterday however still admits to some intermittent lingering DOE, chest pain.  States that something does not feel right.  Denies any nausea, vomiting, cough, fever, abdominal pain.  When attempting to ambulate around the room  had worsening symptoms.  Physical Exam:  Vitals:   10/17/24 0515 10/17/24 0745 10/17/24 0807 10/17/24 0900  BP: (!) 153/62 (!) 112/101 (!) 145/70   Pulse: 67 97 67 72  Resp: 20 (!) 22 18   Temp: 97.8 F (36.6 C)  (!) 97.5 F (36.4 C)   TempSrc:      SpO2: 100% 100% 97%   Weight:      Height:        GENERAL:  Alert, pleasant, no acute distress  HEENT:  EOMI CARDIOVASCULAR:  RRR, no murmurs appreciated RESPIRATORY:  Clear to auscultation, no wheezing, rales, or rhonchi GASTROINTESTINAL:  Soft, nontender, nondistended EXTREMITIES:  No LE edema bilaterally NEURO:  No new focal deficits appreciated SKIN:  No rashes noted PSYCH:  Appropriate mood and affect     Data Reviewed:  Imaging Studies: DG Chest 2 View Result Date: 10/16/2024 CLINICAL DATA:  Chest pain. EXAM: CHEST - 2 VIEW COMPARISON:  October 05, 2024 FINDINGS: The heart size and mediastinal contours are within normal limits. Both lungs are clear. Radiopaque surgical clips are seen within the right upper quadrant. There is evidence of prior right shoulder arthroplasty. The visualized skeletal structures are unremarkable. IMPRESSION: No active cardiopulmonary disease. Electronically Signed   By: Suzen Dials M.D.   On: 10/16/2024 17:40   ECHOCARDIOGRAM COMPLETE Result Date: 10/07/2024    ECHOCARDIOGRAM REPORT   Patient Name:   Amanda Lambert Date of Exam: 10/06/2024 Medical Rec #:  [id number]      Height:       65.0 in Accession #:    [id number]     Weight:       178.6 lb Date  of Birth:  [birth year]       BSA:          1.885 m Patient Age:    78 years       BP:           164/73 mmHg Patient Gender: F              HR:           70 bpm. Exam Location:  [facility] Procedure: 2D Echo, Cardiac Doppler and Color Doppler (Both Spectral and Color            Flow Doppler were utilized during procedure). Indications:     R07.9 Chest pain  History:         Patient has prior history of Echocardiogram examinations, most                  recent  05/19/2021. CAD, Arrythmias:LBBB, Signs/Symptoms:Syncope;                  Risk Factors:Hypertension.  Sonographer:     Carl Rodgers-Jones RDCS Referring Phys:  [id number] FLYJFFJI A ARIDA Diagnosing Phys: Lonni Hanson MD IMPRESSIONS  1. Left ventricular ejection fraction, by estimation, is 55 to 60%. The left ventricle has normal function. The left ventricle has no regional wall motion abnormalities. There is mild left ventricular hypertrophy. Left ventricular diastolic parameters are consistent with Grade I diastolic dysfunction (impaired relaxation).  2. Right ventricular systolic function is normal. The right ventricular size is normal.  3. The mitral valve is degenerative. Mild mitral valve regurgitation. No evidence of mitral stenosis.  4. The aortic valve was not well visualized. There is mild calcification of the aortic valve. Aortic valve regurgitation is not visualized. Aortic valve sclerosis/calcification is present, without any evidence of aortic stenosis. FINDINGS  Left Ventricle: Left ventricular ejection fraction, by estimation, is 55 to 60%. The left ventricle has normal function. The left ventricle has no regional wall motion abnormalities. The left ventricular internal cavity size was normal in size. There is  mild left ventricular hypertrophy. Abnormal (paradoxical) septal motion, consistent with left bundle branch block. Left ventricular diastolic parameters are consistent with Grade I diastolic dysfunction (impaired relaxation). Right Ventricle: The right ventricular size is normal. No increase in right ventricular wall thickness. Right ventricular systolic function is normal. Left Atrium: Left atrial size was normal in size. Right Atrium: Right atrial size was normal in size. Pericardium: There is no evidence of pericardial effusion. Mitral Valve: The mitral valve is degenerative in appearance. There is mild thickening of the mitral valve leaflet(s). There is mild calcification of the mitral  valve leaflet(s). Mild mitral valve regurgitation. No evidence of mitral valve stenosis. Tricuspid Valve: The tricuspid valve is not well visualized. Tricuspid valve regurgitation is trivial. Aortic Valve: The aortic valve was not well visualized. There is mild calcification of the aortic valve. Aortic valve regurgitation is not visualized. Aortic valve sclerosis/calcification is present, without any evidence of aortic stenosis. Aortic valve mean gradient measures 5.6 mmHg. Aortic valve peak gradient measures 10.7 mmHg. Aortic valve area, by VTI measures 1.84 cm. Pulmonic Valve: The pulmonic valve was not well visualized. Pulmonic valve regurgitation is not visualized. No evidence of pulmonic stenosis. Aorta: The aortic root is normal in size and structure. Pulmonary Artery: The pulmonary artery is not well seen. IAS/Shunts: The interatrial septum was not well visualized.  LEFT VENTRICLE PLAX 2D LVIDd:         3.90 cm   Diastology  LVIDs:         2.80 cm   LV e' medial:    3.86 cm/s LV PW:         1.10 cm   LV E/e' medial:  11.2 LV IVS:        1.10 cm   LV e' lateral:   4.73 cm/s LVOT diam:     1.90 cm   LV E/e' lateral: 9.2 LV SV:         55 LV SV Index:   29 LVOT Area:     2.84 cm  RIGHT VENTRICLE RV Basal diam:  3.60 cm RV S prime:     13.70 cm/s TAPSE (M-mode): 2.4 cm LEFT ATRIUM             Index        RIGHT ATRIUM           Index LA diam:        4.00 cm 2.12 cm/m   RA Area:     11.90 cm LA Vol (A2C):   38.8 ml 20.58 ml/m  RA Volume:   30.00 ml  15.91 ml/m LA Vol (A4C):   52.2 ml 27.69 ml/m LA Biplane Vol: 46.8 ml 24.83 ml/m  AORTIC VALVE AV Area (Vmax):    1.72 cm AV Area (Vmean):   1.63 cm AV Area (VTI):     1.84 cm AV Vmax:           163.86 cm/s AV Vmean:          111.084 cm/s AV VTI:            0.301 m AV Peak Grad:      10.7 mmHg AV Mean Grad:      5.6 mmHg LVOT Vmax:         99.65 cm/s LVOT Vmean:        63.750 cm/s LVOT VTI:          0.195 m LVOT/AV VTI ratio: 0.65  AORTA Ao Root diam: 3.30 cm  MITRAL VALVE MV Area (PHT): 2.93 cm    SHUNTS MV Decel Time: 259 msec    Systemic VTI:  0.20 m MV E velocity: 43.45 cm/s  Systemic Diam: 1.90 cm MV A velocity: 83.30 cm/s MV E/A ratio:  0.52 Lonni End MD Electronically signed by Lonni Hanson MD Signature Date/Time: 10/07/2024/7:37:00 AM    Final    CARDIAC CATHETERIZATION Result Date: 10/06/2024   Mid LAD-1 lesion is 10% stenosed.   Dist LAD lesion is 30% stenosed.   Mid LAD-2 lesion is 90% stenosed.   2nd Diag lesion is 30% stenosed.   Prox LAD to Mid LAD lesion is 30% stenosed.   Prox RCA lesion is 20% stenosed.   Balloon angioplasty was performed using a BALLOON TREK RX 2.5X12.   Post intervention, there is a 0% residual stenosis.   The left ventricular systolic function is normal.   LV end diastolic pressure is normal.   The left ventricular ejection fraction is 55-65% by visual estimate.   In the absence of any other complications or medical issues, we expect the patient to be ready for discharge from an interventional cardiology perspective on 10/07/2024.   Recommend uninterrupted dual antiplatelet therapy with Aspirin  81mg  daily and Ticagrelor  90mg  twice daily for a minimum of 12 months (ACS-Class I recommendation). 1.  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  No other obstructive disease. 2.  Normal LV  systolic function normal left ventricular end-diastolic pressure. 3.  Successful balloon angioplasty to the mid LAD for in-stent restenosis.  OCT was performed before and showed severe neointimal hyperplasia with no evidence of mechanical issues or stent underexpansion.  The restenosis did not extend outside the stent distally. Recommendations: If she develops recurrent restenosis, recommend treatment with a drug-coated balloon. I switch propranolol  to carvedilol  for better blood pressure control.   DG Chest 2 View Result Date: 10/05/2024 EXAM: 2 VIEW(S) XRAY OF THE CHEST 10/05/2024 06:55:00 PM  COMPARISON: X-ray 10/02/2024, CT chest 08/28/2016. CLINICAL HISTORY: CP. FINDINGS: LUNGS AND PLEURA: No focal pulmonary opacity. No pleural effusion. No pneumothorax. HEART AND MEDIASTINUM: Calcified aorta. Small hiatal hernia again noted. BONES AND SOFT TISSUES: Right shoulder prosthesis noted. Prior vertebral augmentation. IMPRESSION: 1. No acute cardiopulmonary abnormality. Electronically signed by: Morgane Naveau MD MD 10/05/2024 07:00 PM EST RP Workstation: [id number]   DG Chest 2 View Result Date: 10/02/2024 CLINICAL DATA:  Chest pain since last night. EXAM: CHEST - 2 VIEW COMPARISON:  Chest x-ray 02/11/2024 and lumbar spine 01/22/2024 FINDINGS: Lungs are adequately inflated without focal airspace consolidation or effusion. Cardiomediastinal silhouette and remainder of the exam is unchanged. EKG lead projects over the right upper lung. IMPRESSION: No active cardiopulmonary disease. Electronically Signed   By: Toribio Agreste M.D.   On: 10/02/2024 10:25    There are no new results to review at this time.  Previous records (including but not limited to H&P, progress notes, nursing notes, TOC management) were reviewed in assessment of this patient.  Labs: CBC: Recent Labs  Lab 10/16/24 1710 10/17/24 0519  WBC 6.2 5.4  HGB 12.0 12.2  HCT 36.3 38.4  MCV 88.1 91.4  PLT 334 320   Basic Metabolic Panel: Recent Labs  Lab 10/16/24 1710 10/16/24 2036 10/17/24 0519  NA 137  --  137  K 3.7  --  3.7  CL 98  --  100  CO2 28  --  25  GLUCOSE 113*  --  104*  BUN 14  --  12  CREATININE 1.06*  --  0.97  CALCIUM  9.7  --  9.3  MG  --  1.5*  --   PHOS  --  2.9  --    Liver Function Tests: No results for input(s): AST, ALT, ALKPHOS, BILITOT, PROT, ALBUMIN in the last 168 hours. CBG: No results for input(s): GLUCAP in the last 168 hours.  Scheduled Meds:  aspirin  EC  81 mg Oral Daily   carvedilol   12.5 mg Oral BID WC   enoxaparin  (LOVENOX ) injection  40 mg Subcutaneous Q24H    gabapentin   300 mg Oral BID   isosorbide  mononitrate  120 mg Oral Daily   magnesium  chloride  64 mg Oral BID   pantoprazole   40 mg Oral Daily   rosuvastatin   20 mg Oral Daily   ticagrelor   90 mg Oral BID   Continuous Infusions: PRN Meds:.acetaminophen , albuterol , cyclobenzaprine , dextromethorphan-guaiFENesin , diphenhydrAMINE , hydrALAZINE , morphine  injection, nitroGLYCERIN   Family Communication: Not at bedside  Disposition: Status is: Observation The patient remains OBS appropriate and will d/c before 2 midnights.     Time spent: 37 minutes  Length of inpatient stay: 0 days  Author: Carliss LELON Canales, DO 10/17/2024 1:04 PM  For on call review [email or web address].   "

## 2024-10-17 NOTE — Evaluation (Signed)
 Physical Therapy Evaluation Patient Details Name: Amanda Lambert MRN: [id number] DOB: May 08, 1946 Today's Date: 10/17/2024  History of Present Illness  Pt is a 79 year old female presents with near syncope and chest pain.   PMH significant for CAD, s/p of stent to LAD, dCHF, LBBB, HTN, HLD, CKD-3a, IBS, varicose vein, bilateral carotid artery stenosis, osteoporosis on abaloparatide . Patient was recenly here for balloon angio 10/06/24.   Clinical Impression  Patient received in bed, she agrees to PT/OT assessment and is pleasant. Patient performed bed mobility mod I. She is able to stand with cga. Orthostatic BP taken during this time with patient feeling worse and worse with increased time. Patient reporting UE tingling, nausea and BP very low with standing for 3 minutes. Patient then returned to supine. BP re-taken and improved. See orthostatic BP in flowsheets. Patient will continue to benefit from skilled PT to improve safety and independence.            If plan is discharge home, recommend the following: A little help with walking and/or transfers;A little help with bathing/dressing/bathroom;Help with stairs or ramp for entrance;Assist for transportation   Can travel by private vehicle    yes    Equipment Recommendations None recommended by PT  Recommendations for Other Services       Functional Status Assessment Patient has had a recent decline in their functional status and demonstrates the ability to make significant improvements in function in a reasonable and predictable amount of time.     Precautions / Restrictions Precautions Precautions: Fall Recall of Precautions/Restrictions: Intact Precaution/Restrictions Comments: watch BP Restrictions Weight Bearing Restrictions Per Provider Order: No      Mobility  Bed Mobility Overal bed mobility: Modified Independent Bed Mobility: Supine to Sit, Sit to Supine     Supine to sit: Modified independent (Device/Increase  time) Sit to supine: Modified independent (Device/Increase time)   General bed mobility comments: no physical assist needed    Transfers Overall transfer level: Needs assistance Equipment used: Rolling walker (2 wheels) Transfers: Sit to/from Stand Sit to Stand: Supervision           General transfer comment: patient stood for extended period for orthostatic vitals to be taken. Patient felt progressively worse during this time and BP was very low, therefore patient unable to walk and returned to supine.    Ambulation/Gait               General Gait Details: unable due to low BP  Stairs            Wheelchair Mobility     Tilt Bed    Modified Rankin (Stroke Patients Only)       Balance Overall balance assessment: Modified Independent Sitting-balance support: Feet supported Sitting balance-Leahy Scale: Normal     Standing balance support: Bilateral upper extremity supported, During functional activity Standing balance-Leahy Scale: Good                               Pertinent Vitals/Pain Pain Assessment Pain Assessment: Faces Faces Pain Scale: Hurts a little bit Pain Location: chest discomfort Pain Descriptors / Indicators: Discomfort Pain Intervention(s): Monitored during session    Home Living Family/patient expects to be discharged to:: Private residence Living Arrangements: Alone Available Help at Discharge: Family;Available PRN/intermittently Type of Home: House Home Access: Stairs to enter Entrance Stairs-Rails: Left;Right;Can reach both Entrance Stairs-Number of Steps: 3   Home Layout: Able to live  on main level with bedroom/bathroom Home Equipment: Rolling Walker (2 wheels);Cane - single point;Grab bars - toilet;Grab bars - tub/shower;Hand held shower head;Shower seat;BSC/3in1      Prior Function Prior Level of Function : Independent/Modified Independent;Driving             Mobility Comments: has AD PRN if needed,  but generally amb with no AD ADLs Comments: MOD I-I in ADL/IADL     Extremity/Trunk Assessment   Upper Extremity Assessment Upper Extremity Assessment: Defer to OT evaluation    Lower Extremity Assessment Lower Extremity Assessment: Overall WFL for tasks assessed       Communication   Communication Communication: No apparent difficulties    Cognition Arousal: Alert Behavior During Therapy: WFL for tasks assessed/performed   PT - Cognitive impairments: No apparent impairments                         Following commands: Intact       Cueing Cueing Techniques: Verbal cues     General Comments General comments (skin integrity, edema, etc.): pt is orthostatic and symptomatic    Exercises     Assessment/Plan    PT Assessment Patient needs continued PT services  PT Problem List Cardiopulmonary status limiting activity;Decreased activity tolerance;Decreased mobility       PT Treatment Interventions DME instruction;Gait training;Stair training;Functional mobility training;Therapeutic activities;Therapeutic exercise;Patient/family education    PT Goals (Current goals can be found in the Care Plan section)  Acute Rehab PT Goals Patient Stated Goal: find out what is going on and return home PT Goal Formulation: With patient Time For Goal Achievement: 10/25/24 Potential to Achieve Goals: Good    Frequency Min 2X/week     Co-evaluation PT/OT/SLP Co-Evaluation/Treatment: Yes Reason for Co-Treatment: To address functional/ADL transfers;For patient/therapist safety PT goals addressed during session: Mobility/safety with mobility OT goals addressed during session: ADL's and self-care       AM-PAC PT 6 Clicks Mobility  Outcome Measure Help needed turning from your back to your side while in a flat bed without using bedrails?: None Help needed moving from lying on your back to sitting on the side of a flat bed without using bedrails?: None Help needed  moving to and from a bed to a chair (including a wheelchair)?: A Little Help needed standing up from a chair using your arms (e.g., wheelchair or bedside chair)?: A Little Help needed to walk in hospital room?: A Lot Help needed climbing 3-5 steps with a railing? : Total 6 Click Score: 17    End of Session   Activity Tolerance: Other (comment) (orthostatic and symptomatic) Patient left: in bed;with call bell/phone within reach;with bed alarm set Nurse Communication: Mobility status PT Visit Diagnosis: Other abnormalities of gait and mobility (R26.89);Difficulty in walking, not elsewhere classified (R26.2)    Time: 9145-9087 PT Time Calculation (min) (ACUTE ONLY): 18 min   Charges:   PT Evaluation $PT Eval Moderate Complexity: 1 Mod   PT General Charges $$ ACUTE PT VISIT: 1 Visit         Ivylynn Hoppes, PT, GCS 10/17/24,10:06 AM

## 2024-10-17 NOTE — Evaluation (Signed)
 Occupational Therapy Evaluation Patient Details Name: Amanda Lambert MRN: [id number] DOB: [birth year] Today's Date: 10/17/2024   History of Present Illness   Pt is a 79 year old female presents with near syncope and chest pain.   PMH significant for CAD, s/p of stent to LAD, dCHF, LBBB, HTN, HLD, CKD-3a, IBS, varicose vein, bilateral carotid artery stenosis, osteoporosis on abaloparatide . Patient was recenly here for balloon angio 10/06/24.     Clinical Impressions Chart reviewed to date, pt greeted semi supine in bed, agreeable to OT evaluation. PTA pt is generally MOD I-I in ADL/IADL, amb with no AD. Pt presents with deficits in activity tolerance on this date affecting safe and optimal ADL completion. Bed mobility completed with supervision-MOD I. Static standing for approx 3 minutes for orthostatic vital sign monitoring with supervision. Pt reports dizziness/chest discomfort with standing, unchanged with amount of time standing however does become nauseous. BP 86/48 at 3 minute standing, 165/78 back in supine, HR 70s bpm. Please see vitals in flow sheets. Pt will benefit from acute OT to address functional deficits/facilitate optimal ADL/functional mobility performance. Pt is left in bed, all needs met. OT will follow.        If plan is discharge home, recommend the following:   A little help with walking and/or transfers;A little help with bathing/dressing/bathroom     Functional Status Assessment   Patient has had a recent decline in their functional status and demonstrates the ability to make significant improvements in function in a reasonable and predictable amount of time.     Equipment Recommendations   None recommended by OT     Recommendations for Other Services         Precautions/Restrictions   Precautions Precautions: Fall Recall of Precautions/Restrictions: Intact Precaution/Restrictions Comments: watch BP Restrictions Weight Bearing Restrictions Per  Provider Order: No     Mobility Bed Mobility Overal bed mobility: Needs Assistance Bed Mobility: Supine to Sit, Sit to Supine     Supine to sit: Supervision-MOD I Sit to supine: Supervision-MOD I        Transfers Overall transfer level: Needs assistance Equipment used: None Transfers: Sit to/from Stand Sit to Stand: Supervision                  Balance Overall balance assessment: Needs assistance Sitting-balance support: Feet supported Sitting balance-Leahy Scale: Good     Standing balance support: No upper extremity supported Standing balance-Leahy Scale: Fair                             ADL either performed or assessed with clinical judgement   ADL Overall ADL's : Needs assistance/impaired     Grooming: Supervision/safety               Lower Body Dressing: Supervision/safety;Sitting/lateral leans                       Vision Patient Visual Report: No change from baseline       Perception         Praxis         Pertinent Vitals/Pain Pain Assessment Pain Assessment: 0-10 Pain Score: 2  Pain Location: chest discomfort Pain Descriptors / Indicators: Discomfort Pain Intervention(s): Limited activity within patient's tolerance, Monitored during session, Repositioned     Extremity/Trunk Assessment Upper Extremity Assessment Upper Extremity Assessment: Defer to OT evaluation   Lower Extremity Assessment Lower Extremity Assessment: Overall WFL for  tasks assessed       Communication Communication Communication: No apparent difficulties   Cognition Arousal: Alert Behavior During Therapy: WFL for tasks assessed/performed Cognition: No apparent impairments                               Following commands: Intact       Cueing  General Comments   Cueing Techniques: Verbal cues  pt is orthostatic and symptomatic   Exercises Other Exercises Other Exercises: edu re role of OT, role of rehab    Shoulder Instructions      Home Living Family/patient expects to be discharged to:: Private residence Living Arrangements: Alone Available Help at Discharge: Family;Available PRN/intermittently Type of Home: House Home Access: Stairs to enter [employer] of Steps: 3 Entrance Stairs-Rails: Left;Right;Can reach both Home Layout: Able to live on main level with bedroom/bathroom     Bathroom Shower/Tub: Chief Strategy Officer: Handicapped height Bathroom Accessibility: Yes How Accessible: Accessible via walker Home Equipment: Rolling Walker (2 wheels);Cane - single point;Grab bars - toilet;Grab bars - tub/shower;Hand held shower head;Shower seat;BSC/3in1          Prior Functioning/Environment Prior Level of Function : Independent/Modified Independent;Driving             Mobility Comments: has AD PRN if needed, but generally amb with no AD ADLs Comments: MOD I-I in ADL/IADL    OT Problem List: Decreased activity tolerance;Cardiopulmonary status limiting activity   OT Treatment/Interventions: Self-care/ADL training;Therapeutic exercise;DME and/or AE instruction;Therapeutic activities      OT Goals(Current goals can be found in the care plan section)   Acute Rehab OT Goals Patient Stated Goal: figure out what is going on OT Goal Formulation: With patient Time For Goal Achievement: 10/31/24 Potential to Achieve Goals: Good ADL Goals Pt Will Perform Grooming: with modified independence Pt Will Perform Lower Body Dressing: with modified independence;sit to/from stand;sitting/lateral leans Pt Will Transfer to Toilet: with modified independence;ambulating Pt Will Perform Toileting - Clothing Manipulation and hygiene: with modified independence;sitting/lateral leans;sit to/from stand   OT Frequency:  Min 2X/week    Co-evaluation PT/OT/SLP Co-Evaluation/Treatment: Yes Reason for Co-Treatment: To address functional/ADL transfers;For  patient/therapist safety PT goals addressed during session: Mobility/safety with mobility OT goals addressed during session: ADL's and self-care      AM-PAC OT 6 Clicks Daily Activity     Outcome Measure Help from another person eating meals?: None Help from another person taking care of personal grooming?: None Help from another person toileting, which includes using toliet, bedpan, or urinal?: A Little Help from another person bathing (including washing, rinsing, drying)?: A Little Help from another person to put on and taking off regular upper body clothing?: None Help from another person to put on and taking off regular lower body clothing?: A Little 6 Click Score: 21   End of Session Nurse Communication: Mobility status;Other (comment) (nurse/MD re vitals)  Activity Tolerance: Treatment limited secondary to medical complications (Comment) (orthostatic/syptomatic) Patient left: in bed;with call bell/phone within reach;with bed alarm set  OT Visit Diagnosis: Other abnormalities of gait and mobility (R26.89)                Time: 9145-9087 OT Time Calculation (min): 18 min Charges:  OT General Charges $OT Visit: 1 Visit OT Evaluation $OT Eval Low Complexity: 1 Low  Therisa Sheffield, OTD OTR/L  10/17/24, 10:16 AM

## 2024-10-18 DIAGNOSIS — E7800 Pure hypercholesterolemia, unspecified: Secondary | ICD-10-CM | POA: Diagnosis not present

## 2024-10-18 DIAGNOSIS — R079 Chest pain, unspecified: Secondary | ICD-10-CM | POA: Diagnosis not present

## 2024-10-18 DIAGNOSIS — I951 Orthostatic hypotension: Secondary | ICD-10-CM

## 2024-10-18 DIAGNOSIS — I5032 Chronic diastolic (congestive) heart failure: Secondary | ICD-10-CM | POA: Diagnosis not present

## 2024-10-18 DIAGNOSIS — I10 Essential (primary) hypertension: Secondary | ICD-10-CM | POA: Diagnosis not present

## 2024-10-18 DIAGNOSIS — I25119 Atherosclerotic heart disease of native coronary artery with unspecified angina pectoris: Secondary | ICD-10-CM

## 2024-10-18 DIAGNOSIS — R55 Syncope and collapse: Secondary | ICD-10-CM | POA: Diagnosis not present

## 2024-10-18 DIAGNOSIS — R9431 Abnormal electrocardiogram [ECG] [EKG]: Secondary | ICD-10-CM | POA: Diagnosis not present

## 2024-10-18 LAB — BASIC METABOLIC PANEL WITH GFR
Anion gap: 9 (ref 5–15)
BUN: 13 mg/dL (ref 8–23)
CO2: 25 mmol/L (ref 22–32)
Calcium: 9.3 mg/dL (ref 8.9–10.3)
Chloride: 103 mmol/L (ref 98–111)
Creatinine, Ser: 1.03 mg/dL — ABNORMAL HIGH (ref 0.44–1.00)
GFR, Estimated: 56 mL/min — ABNORMAL LOW
Glucose, Bld: 106 mg/dL — ABNORMAL HIGH (ref 70–99)
Potassium: 3.6 mmol/L (ref 3.5–5.1)
Sodium: 137 mmol/L (ref 135–145)

## 2024-10-18 LAB — CBC
HCT: 32.6 % — ABNORMAL LOW (ref 36.0–46.0)
Hemoglobin: 10.8 g/dL — ABNORMAL LOW (ref 12.0–15.0)
MCH: 29.1 pg (ref 26.0–34.0)
MCHC: 33.1 g/dL (ref 30.0–36.0)
MCV: 87.9 fL (ref 80.0–100.0)
Platelets: 330 10*3/uL (ref 150–400)
RBC: 3.71 MIL/uL — ABNORMAL LOW (ref 3.87–5.11)
RDW: 12.2 % (ref 11.5–15.5)
WBC: 6.6 10*3/uL (ref 4.0–10.5)
nRBC: 0 % (ref 0.0–0.2)

## 2024-10-18 LAB — MAGNESIUM: Magnesium: 1.9 mg/dL (ref 1.7–2.4)

## 2024-10-18 MED ORDER — ORAL CARE MOUTH RINSE: 15.0000 mL | OROMUCOSAL | Status: DC | PRN

## 2024-10-18 NOTE — Progress Notes (Signed)
 Patient is prepared for discharge per provider orders. Discharge instructions reviewed with patient. IV access removed with tip intact. Vital signs stable. No acute signs of distress noted. Personal belongings gathered and accounted for. Patient left unit via wheelchair accompanied by family.

## 2024-10-18 NOTE — Progress Notes (Signed)
 "  Rounding Note   Patient Name: Amanda Lambert Date of Encounter: 10/18/2024  [facility] Cardiologist: Gordy Bergamo, MD   Subjective Feeling much better.  No more dizziness.   Scheduled Meds:  aspirin  EC  81 mg Oral Daily   carvedilol   12.5 mg Oral BID WC   enoxaparin  (LOVENOX ) injection  40 mg Subcutaneous Q24H   gabapentin   300 mg Oral BID   isosorbide  mononitrate  120 mg Oral Daily   magnesium  chloride  64 mg Oral BID   pantoprazole   40 mg Oral Daily   rosuvastatin   20 mg Oral Daily   ticagrelor   90 mg Oral BID   Continuous Infusions:  PRN Meds: acetaminophen , albuterol , cyclobenzaprine , dextromethorphan-guaiFENesin , diphenhydrAMINE , hydrALAZINE , morphine  injection, nitroGLYCERIN , mouth rinse   Vital Signs  Vitals:   10/17/24 2328 10/18/24 0417 10/18/24 0500 10/18/24 0821  BP: (!) 124/58 (!) 116/59  137/67  Pulse: 75 79  73  Resp: 20 20  18   Temp: 98.2 F (36.8 C) 98.6 F (37 C)  98.3 F (36.8 C)  TempSrc:      SpO2: 97% 95%  94%  Weight:   84.9 kg   Height:        Intake/Output Summary (Last 24 hours) at 10/18/2024 1212 Last data filed at 10/18/2024 1047 Gross per 24 hour  Intake 356 ml  Output 400 ml  Net -44 ml      10/18/2024    5:00 AM 10/16/2024    4:54 PM 10/09/2024    8:53 AM  Last 3 Weights  Weight (lbs) 187 lb 2.7 oz 176 lb 12.9 oz 177 lb  Weight (kg) 84.9 kg 80.2 kg 80.287 kg      Telemetry Sinus rhythm.  PVCs - Personally Reviewed  ECG  N/a - Personally Reviewed  Physical Exam  VS:  BP 137/67 (BP Location: Left Arm)   Pulse 73   Temp 98.3 F (36.8 C)   Resp 18   Ht 5' 5 (1.651 m)   Wt 84.9 kg   SpO2 94%   BMI 31.15 kg/m  , BMI Body mass index is 31.15 kg/m. GENERAL:  Well appearing HEENT: Pupils equal round and reactive, fundi not visualized, oral mucosa unremarkable NECK:  No jugular venous distention, waveform within normal limits, carotid upstroke brisk and symmetric, no bruits, no thyromegaly LUNGS:  Clear to  auscultation bilaterally HEART:  RRR.  PMI not displaced or sustained,S1 and S2 within normal limits, no S3, no S4, no clicks, no rubs, no murmurs ABD:  Flat, positive bowel sounds normal in frequency in pitch, no bruits, no rebound, no guarding, no midline pulsatile mass, no hepatomegaly, no splenomegaly EXT:  2 plus pulses throughout, no edema, no cyanosis no clubbing SKIN:  No rashes no nodules.  Mid R wrist ecchymosis.  NEURO:  Cranial nerves II through XII grossly intact, motor grossly intact throughout PSYCH:  Cognitively intact, oriented to person place and time  Labs High Sensitivity Troponin:  No results for input(s): TROPONINIHS in the last 720 hours.  Recent Labs  Lab 10/05/24 1827 10/05/24 2218 10/06/24 0522 10/16/24 1710 10/16/24 2038  TRNPT 20* 19 21* 18 19       Chemistry Recent Labs  Lab 10/16/24 1710 10/16/24 2036 10/17/24 0519 10/18/24 0434  NA 137  --  137 137  K 3.7  --  3.7 3.6  CL 98  --  100 103  CO2 28  --  25 25  GLUCOSE 113*  --  104* 106*  BUN 14  --  12 13  CREATININE 1.06*  --  0.97 1.03*  CALCIUM  9.7  --  9.3 9.3  MG  --  1.5*  --  1.9  GFRNONAA 54*  --  60* 56*  ANIONGAP 11  --  12 9    Lipids No results for input(s): CHOL, TRIG, HDL, LABVLDL, LDLCALC, CHOLHDL in the last 168 hours.  Hematology Recent Labs  Lab 10/16/24 1710 10/17/24 0519 10/18/24 0434  WBC 6.2 5.4 6.6  RBC 4.12 4.20 3.71*  HGB 12.0 12.2 10.8*  HCT 36.3 38.4 32.6*  MCV 88.1 91.4 87.9  MCH 29.1 29.0 29.1  MCHC 33.1 31.8 33.1  RDW 12.0 12.0 12.2  PLT 334 320 330   Thyroid  No results for input(s): TSH, FREET4 in the last 168 hours.  BNP Recent Labs  Lab 10/16/24 2036  PROBNP 896.0*    DDimer No results for input(s): DDIMER in the last 168 hours.   Radiology  DG Chest 2 View Result Date: 10/16/2024 CLINICAL DATA:  Chest pain. EXAM: CHEST - 2 VIEW COMPARISON:  October 05, 2024 FINDINGS: The heart size and mediastinal contours are within  normal limits. Both lungs are clear. Radiopaque surgical clips are seen within the right upper quadrant. There is evidence of prior right shoulder arthroplasty. The visualized skeletal structures are unremarkable. IMPRESSION: No active cardiopulmonary disease. Electronically Signed   By: Suzen Dials M.D.   On: 10/16/2024 17:40    Cardiac Studies Echo 10/06/24:  1. Left ventricular ejection fraction, by estimation, is 55 to 60%. The  left ventricle has normal function. The left ventricle has no regional  wall motion abnormalities. There is mild left ventricular hypertrophy.  Left ventricular diastolic parameters  are consistent with Grade I diastolic dysfunction (impaired relaxation).   2. Right ventricular systolic function is normal. The right ventricular  size is normal.   3. The mitral valve is degenerative. Mild mitral valve regurgitation. No  evidence of mitral stenosis.   4. The aortic valve was not well visualized. There is mild calcification  of the aortic valve. Aortic valve regurgitation is not visualized. Aortic  valve sclerosis/calcification is present, without any evidence of aortic  stenosis.   LHC 10/06/24:   Mid LAD-1 lesion is 10% stenosed.   Dist LAD lesion is 30% stenosed.   Mid LAD-2 lesion is 90% stenosed.   2nd Diag lesion is 30% stenosed.   Prox LAD to Mid LAD lesion is 30% stenosed.   Prox RCA lesion is 20% stenosed.   Balloon angioplasty was performed using a BALLOON TREK RX 2.5X12.   Post intervention, there is a 0% residual stenosis.   The left ventricular systolic function is normal.   LV end diastolic pressure is normal.   The left ventricular ejection fraction is 55-65% by visual estimate.   In the absence of any other complications or medical issues, we expect the patient to be ready for discharge from an interventional cardiology perspective on 10/07/2024.   Recommend uninterrupted dual antiplatelet therapy with Aspirin  81mg  daily and Ticagrelor  90mg   twice daily for a minimum of 12 months (ACS-Class I recommendation).   1.  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  No other obstructive disease. 2.  Normal LV systolic function normal left ventricular end-diastolic pressure. 3.  Successful balloon angioplasty to the mid LAD for in-stent restenosis.  OCT was performed before and showed severe neointimal hyperplasia with no evidence  of mechanical issues or stent underexpansion.  The restenosis did not extend outside the stent distally.   Recommendations: If she develops recurrent restenosis, recommend treatment with a drug-coated balloon. I switch propranolol  to carvedilol  for better blood pressure control.  Patient Profile   79 y.o. female Ms. Glantz is a 79F with CAD s/p recent LAD PCI in the setting of in-stent restenosis, hypertension, and hyperlipidemia admitted with chest pain and near syncope.   Assessment & Plan   # CAD:  LAD in-stent restenosis with PTCA to mid LAD on 10/06/24.  She was readmitted 1/22 with chest pain and pre-syncope.  She declined repeat LHC or stress.  Now feeling better after stopping ranolazine .   HS-troponin minimally elevated to 21.  Continue with medical management with aspirin , carvedilol , Imdur , and rosuvastatin .  Ticagrelor  x12 months.    # Anenia: Hemoglobin 10.8 from 12.2 yesterday.  Will repeat x1 to make sure it is not still declining.    [facility] will sign off.   The patient is ready for discharge today from a cardiac standpoint. Medication Recommendations:  stop Ranexa  Other recommendations (labs, testing, etc):  f/u h/h Follow up as an outpatient:  we will arrange For questions or updates, please contact [facility] Please consult [email or web address] for contact info under       Signed, Annabella Scarce, MD  10/18/2024, 12:12 PM    "

## 2024-10-18 NOTE — Discharge Summary (Signed)
 " Physician Discharge Summary   Patient: Amanda Lambert MRN: [id number] DOB: May 23, 1946  Admit date:     10/16/2024  Discharge date: 10/18/24  Discharge Physician: Carliss LELON Canales   PCP: [place], [place]  E, PA   Recommendations at discharge:    Pt to be discharged home.   If you experience worsening fever, chills, chest pain, shortness of breath, or other concerning symptoms, please call your PCP or go to the emergency department immediately.  Discharge Diagnoses: Principal Problem:   Near syncope Active Problems:   CAD (coronary artery disease)   Chest pain   Hypomagnesemia   HTN (hypertension)   Hypercholesteremia   Chronic diastolic CHF (congestive heart failure) (HCC)   Chronic kidney disease, stage 3a (HCC)   Prolonged QT interval   Overweight (BMI 25.0-29.9)  Resolved Problems:   * No resolved hospital problems. *   Hospital Course:  79 y.o. female with medical history significant of f CAD, s/p of stent to LAD, dCHF, LBBB, HTN, HLD, CKD-3a, IBS, varicose vein, bilateral carotid artery stenosis, osteoporosis on abaloparatide , who presents with near syncope and chest pain.    Pt was recently hospitalized from 1/11 - 1/14 due to unstable angina. Pt underwent LHC on 01/12 showed severe one-vessel CAD in mid LAD due to severe in-stent restenosis, status post successful balloon angioplasty. Per DC summery, if patient develops recurrent restenosis, cardiologist recommended treatment with drug-coated balloon.    Assessment and Plan:   Near syncope and chest pain - Etiology unclear but differentials include orthostasis, vasovagal, arrhythmia, ACS, underlying CAD.  Likely exacerbated by ranolazine .  Noted orthostatics measured by OT.  IV fluid bolus ordered.  Evaluated by cardiology.  Suspecting that ranolazine  is her main culprit.  Symptoms now resolved after discontinuation yesterday.  Feeling improved, no dizziness or dyspnea on exertion.  Ambulating, eager for discharge home.   Discontinue ranolazine , resume other cardiac medications.  Follow-up with cardiology in the outpatient setting in 1 week.  Atypical chest pain with CAD and recent balloon angioplasty of restenosed stent - Chest pain-initially resolved however this morning had mild recurrence, continued DOE.  Troponins negative.  No abnormalities observed on telemetry so far.  Electrolytes WNL.  May be related to underlying orthostasis.  Cardiology evaluated.  Patient wanting to defer stress imaging and cardiac cath.  Cardiology feels the likelihood of in-stent restenosis occurring 2 weeks post cath PTCA is low.  Symptoms have resolved at this point.  Follow-up with cardiology in the outpatient setting.   Hypomagnesemia - Resolved after replenishment.   Hypertension - Imdur , Coreg  on board.   Chronic HFpEF - Does not appear to be in acute exacerbation.   Consultants: Cardiology Procedures performed: None Disposition: Home Diet recommendation:  Cardiac diet  DISCHARGE MEDICATION: Allergies as of 10/18/2024       Reactions   Ace Inhibitors Anaphylaxis   Angioedema.   Bee Venom Anaphylaxis, Other (See Comments)   Levofloxacin     Other Reaction(s): Not available levofloxacin    Shellfish Allergy Hives, Rash, Dermatitis   splotches Shellfish (substance)   Thorazine [chlorpromazine] Anaphylaxis   Wasp Venom Anaphylaxis   Levaquin  [levofloxacin  In D5w] Nausea And Vomiting   Stated by patient she could not handle levaquin  even with antiemetics    Amlodipine     severe leg edema   Atropine Sulfate Other (See Comments)   Elemental Sulfur Itching      Glycopyrrolate  Other (See Comments)   Oxycodone  Other (See Comments)   hallucinations   Ramipril Other (See Comments)  Sulfa Antibiotics Other (See Comments)   Compazine [prochlorperazine Edisylate] Anxiety   Latex Rash        Medication List     STOP taking these medications    ranolazine  500 MG 12 hr tablet Commonly known as: RANEXA         TAKE these medications    Abaloparatide  3120 MCG/1.56ML Sopn Inject 80 mcg into the skin daily.   aspirin  EC 81 MG tablet Take 81 mg by mouth daily. Swallow whole.   carvedilol  12.5 MG tablet Commonly known as: COREG  Take 1 tablet (12.5 mg total) by mouth 2 (two) times daily with a meal.   cyclobenzaprine  10 MG tablet Commonly known as: FLEXERIL  TAKE 1 TABLET BY MOUTH THREE TIMES A DAY AS NEEDED FOR MUSCLE SPASMS   EPINEPHrine  0.3 mg/0.3 mL Soaj injection Commonly known as: EPI-PEN Inject 0.3 mg into the muscle once as needed for anaphylaxis.   esomeprazole 40 MG capsule Commonly known as: NEXIUM Take 40 mg by mouth in the morning.   furosemide  20 MG tablet Commonly known as: LASIX  Take 1 tablet (20 mg total) by mouth daily.   gabapentin  300 MG capsule Commonly known as: NEURONTIN  Take 300 mg by mouth 2 (two) times daily.   isosorbide  mononitrate 120 MG 24 hr tablet Commonly known as: IMDUR  TAKE 1 TABLET BY MOUTH EVERY DAY   magnesium  30 MG tablet Take 30 mg by mouth 2 (two) times daily.   nitroGLYCERIN  0.4 MG SL tablet Commonly known as: NITROSTAT  Place 1 tablet (0.4 mg total) under the tongue every 5 (five) minutes as needed for chest pain.   ondansetron  4 MG disintegrating tablet Commonly known as: ZOFRAN -ODT Take 1 tablet (4 mg total) by mouth every 8 (eight) hours as needed for nausea or vomiting.   potassium chloride  SA 20 MEQ tablet Commonly known as: Klor-Con  M20 Take 1 tablet (20 mEq total) by mouth daily.   rosuvastatin  20 MG tablet Commonly known as: CRESTOR  Take 1 tablet (20 mg total) by mouth daily.   ticagrelor  90 MG Tabs tablet Commonly known as: BRILINTA  Take 1 tablet (90 mg total) by mouth 2 (two) times daily.   Vitamin D  (Ergocalciferol ) 1.25 MG (50000 UNIT) Caps capsule Commonly known as: DRISDOL Take 1 capsule by mouth every 7 (seven) days.         Discharge Exam: Filed Weights   10/16/24 1654 10/18/24 0500  Weight:  80.2 kg 84.9 kg    GENERAL:  Alert, pleasant, no acute distress  HEENT:  EOMI CARDIOVASCULAR:  RRR, no murmurs appreciated RESPIRATORY:  Clear to auscultation, no wheezing, rales, or rhonchi GASTROINTESTINAL:  Soft, nontender, nondistended EXTREMITIES:  No LE edema bilaterally NEURO:  No new focal deficits appreciated SKIN:  No rashes noted PSYCH:  Appropriate mood and affect     Condition at discharge: improving  The results of significant diagnostics from this hospitalization (including imaging, microbiology, ancillary and laboratory) are listed below for reference.   Imaging Studies: DG Chest 2 View Result Date: 10/16/2024 CLINICAL DATA:  Chest pain. EXAM: CHEST - 2 VIEW COMPARISON:  October 05, 2024 FINDINGS: The heart size and mediastinal contours are within normal limits. Both lungs are clear. Radiopaque surgical clips are seen within the right upper quadrant. There is evidence of prior right shoulder arthroplasty. The visualized skeletal structures are unremarkable. IMPRESSION: No active cardiopulmonary disease. Electronically Signed   By: Suzen Dials M.D.   On: 10/16/2024 17:40   ECHOCARDIOGRAM COMPLETE Result Date: 10/07/2024    ECHOCARDIOGRAM  REPORT   Patient Name:   DANILYNN JEMISON Date of Exam: 10/06/2024 Medical Rec #:  [id number]      Height:       65.0 in Accession #:    [id number]     Weight:       178.6 lb Date of Birth:  Feb 17, 1946       BSA:          1.885 m Patient Age:    78 years       BP:           164/73 mmHg Patient Gender: F              HR:           70 bpm. Exam Location:  [facility] Procedure: 2D Echo, Cardiac Doppler and Color Doppler (Both Spectral and Color            Flow Doppler were utilized during procedure). Indications:     R07.9 Chest pain  History:         Patient has prior history of Echocardiogram examinations, most                  recent 05/19/2021. CAD, Arrythmias:LBBB, Signs/Symptoms:Syncope;                  Risk Factors:Hypertension.  Sonographer:      Carl Rodgers-Jones RDCS Referring Phys:  [id number] FLYJFFJI A ARIDA Diagnosing Phys: Lonni Hanson MD IMPRESSIONS  1. Left ventricular ejection fraction, by estimation, is 55 to 60%. The left ventricle has normal function. The left ventricle has no regional wall motion abnormalities. There is mild left ventricular hypertrophy. Left ventricular diastolic parameters are consistent with Grade I diastolic dysfunction (impaired relaxation).  2. Right ventricular systolic function is normal. The right ventricular size is normal.  3. The mitral valve is degenerative. Mild mitral valve regurgitation. No evidence of mitral stenosis.  4. The aortic valve was not well visualized. There is mild calcification of the aortic valve. Aortic valve regurgitation is not visualized. Aortic valve sclerosis/calcification is present, without any evidence of aortic stenosis. FINDINGS  Left Ventricle: Left ventricular ejection fraction, by estimation, is 55 to 60%. The left ventricle has normal function. The left ventricle has no regional wall motion abnormalities. The left ventricular internal cavity size was normal in size. There is  mild left ventricular hypertrophy. Abnormal (paradoxical) septal motion, consistent with left bundle branch block. Left ventricular diastolic parameters are consistent with Grade I diastolic dysfunction (impaired relaxation). Right Ventricle: The right ventricular size is normal. No increase in right ventricular wall thickness. Right ventricular systolic function is normal. Left Atrium: Left atrial size was normal in size. Right Atrium: Right atrial size was normal in size. Pericardium: There is no evidence of pericardial effusion. Mitral Valve: The mitral valve is degenerative in appearance. There is mild thickening of the mitral valve leaflet(s). There is mild calcification of the mitral valve leaflet(s). Mild mitral valve regurgitation. No evidence of mitral valve stenosis. Tricuspid Valve: The tricuspid  valve is not well visualized. Tricuspid valve regurgitation is trivial. Aortic Valve: The aortic valve was not well visualized. There is mild calcification of the aortic valve. Aortic valve regurgitation is not visualized. Aortic valve sclerosis/calcification is present, without any evidence of aortic stenosis. Aortic valve mean gradient measures 5.6 mmHg. Aortic valve peak gradient measures 10.7 mmHg. Aortic valve area, by VTI measures 1.84 cm. Pulmonic Valve: The pulmonic valve was not well visualized. Pulmonic valve regurgitation is  not visualized. No evidence of pulmonic stenosis. Aorta: The aortic root is normal in size and structure. Pulmonary Artery: The pulmonary artery is not well seen. IAS/Shunts: The interatrial septum was not well visualized.  LEFT VENTRICLE PLAX 2D LVIDd:         3.90 cm   Diastology LVIDs:         2.80 cm   LV e' medial:    3.86 cm/s LV PW:         1.10 cm   LV E/e' medial:  11.2 LV IVS:        1.10 cm   LV e' lateral:   4.73 cm/s LVOT diam:     1.90 cm   LV E/e' lateral: 9.2 LV SV:         55 LV SV Index:   29 LVOT Area:     2.84 cm  RIGHT VENTRICLE RV Basal diam:  3.60 cm RV S prime:     13.70 cm/s TAPSE (M-mode): 2.4 cm LEFT ATRIUM             Index        RIGHT ATRIUM           Index LA diam:        4.00 cm 2.12 cm/m   RA Area:     11.90 cm LA Vol (A2C):   38.8 ml 20.58 ml/m  RA Volume:   30.00 ml  15.91 ml/m LA Vol (A4C):   52.2 ml 27.69 ml/m LA Biplane Vol: 46.8 ml 24.83 ml/m  AORTIC VALVE AV Area (Vmax):    1.72 cm AV Area (Vmean):   1.63 cm AV Area (VTI):     1.84 cm AV Vmax:           163.86 cm/s AV Vmean:          111.084 cm/s AV VTI:            0.301 m AV Peak Grad:      10.7 mmHg AV Mean Grad:      5.6 mmHg LVOT Vmax:         99.65 cm/s LVOT Vmean:        63.750 cm/s LVOT VTI:          0.195 m LVOT/AV VTI ratio: 0.65  AORTA Ao Root diam: 3.30 cm MITRAL VALVE MV Area (PHT): 2.93 cm    SHUNTS MV Decel Time: 259 msec    Systemic VTI:  0.20 m MV E velocity: 43.45  cm/s  Systemic Diam: 1.90 cm MV A velocity: 83.30 cm/s MV E/A ratio:  0.52 Lonni End MD Electronically signed by Lonni Hanson MD Signature Date/Time: 10/07/2024/7:37:00 AM    Final    CARDIAC CATHETERIZATION Result Date: 10/06/2024   Mid LAD-1 lesion is 10% stenosed.   Dist LAD lesion is 30% stenosed.   Mid LAD-2 lesion is 90% stenosed.   2nd Diag lesion is 30% stenosed.   Prox LAD to Mid LAD lesion is 30% stenosed.   Prox RCA lesion is 20% stenosed.   Balloon angioplasty was performed using a BALLOON TREK RX 2.5X12.   Post intervention, there is a 0% residual stenosis.   The left ventricular systolic function is normal.   LV end diastolic pressure is normal.   The left ventricular ejection fraction is 55-65% by visual estimate.   In the absence of any other complications or medical issues, we expect the patient to be ready for discharge from an interventional cardiology  perspective on 10/07/2024.   Recommend uninterrupted dual antiplatelet therapy with Aspirin  81mg  daily and Ticagrelor  90mg  twice daily for a minimum of 12 months (ACS-Class I recommendation). 1.  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  No other obstructive disease. 2.  Normal LV systolic function normal left ventricular end-diastolic pressure. 3.  Successful balloon angioplasty to the mid LAD for in-stent restenosis.  OCT was performed before and showed severe neointimal hyperplasia with no evidence of mechanical issues or stent underexpansion.  The restenosis did not extend outside the stent distally. Recommendations: If she develops recurrent restenosis, recommend treatment with a drug-coated balloon. I switch propranolol  to carvedilol  for better blood pressure control.   DG Chest 2 View Result Date: 10/05/2024 EXAM: 2 VIEW(S) XRAY OF THE CHEST 10/05/2024 06:55:00 PM COMPARISON: X-ray 10/02/2024, CT chest 08/28/2016. CLINICAL HISTORY: CP. FINDINGS: LUNGS AND PLEURA: No focal  pulmonary opacity. No pleural effusion. No pneumothorax. HEART AND MEDIASTINUM: Calcified aorta. Small hiatal hernia again noted. BONES AND SOFT TISSUES: Right shoulder prosthesis noted. Prior vertebral augmentation. IMPRESSION: 1. No acute cardiopulmonary abnormality. Electronically signed by: Morgane Naveau MD MD 10/05/2024 07:00 PM EST RP Workstation: [id number]   DG Chest 2 View Result Date: 10/02/2024 CLINICAL DATA:  Chest pain since last night. EXAM: CHEST - 2 VIEW COMPARISON:  Chest x-ray 02/11/2024 and lumbar spine 01/22/2024 FINDINGS: Lungs are adequately inflated without focal airspace consolidation or effusion. Cardiomediastinal silhouette and remainder of the exam is unchanged. EKG lead projects over the right upper lung. IMPRESSION: No active cardiopulmonary disease. Electronically Signed   By: Toribio Agreste M.D.   On: 10/02/2024 10:25    Microbiology: Results for orders placed or performed during the hospital encounter of 10/16/24  Resp panel by RT-PCR (RSV, Flu A&B, Covid) Anterior Nasal Swab     Status: None   Collection Time: 10/16/24 10:38 PM   Specimen: Anterior Nasal Swab  Result Value Ref Range Status   SARS Coronavirus 2 by RT PCR NEGATIVE NEGATIVE Final    Comment: (NOTE) SARS-CoV-2 target nucleic acids are NOT DETECTED.  The SARS-CoV-2 RNA is generally detectable in upper respiratory specimens during the acute phase of infection. The lowest concentration of SARS-CoV-2 viral copies this assay can detect is 138 copies/mL. A negative result does not preclude SARS-Cov-2 infection and should not be used as the sole basis for treatment or other patient management decisions. A negative result may occur with  improper specimen collection/handling, submission of specimen other than nasopharyngeal swab, presence of viral mutation(s) within the areas targeted by this assay, and inadequate number of viral copies(<138 copies/mL). A negative result must be combined with clinical  observations, patient history, and epidemiological information. The expected result is Negative.  Fact Sheet for Patients:  bloggercourse.com  Fact Sheet for Healthcare Providers:  seriousbroker.it  This test is no t yet approved or cleared by the [place]  FDA and  has been authorized for detection and/or diagnosis of SARS-CoV-2 by FDA under an Emergency Use Authorization (EUA). This EUA will remain  in effect (meaning this test can be used) for the duration of the COVID-19 declaration under Section 564(b)(1) of the Act, 21 U.S.C.section 360bbb-3(b)(1), unless the authorization is terminated  or revoked sooner.       Influenza A by PCR NEGATIVE NEGATIVE Final   Influenza B by PCR NEGATIVE NEGATIVE Final    Comment: (NOTE) The Xpert Xpress SARS-CoV-2/FLU/RSV plus assay is intended as an aid in the diagnosis of influenza  from Nasopharyngeal swab specimens and should not be used as a sole basis for treatment. Nasal washings and aspirates are unacceptable for Xpert Xpress SARS-CoV-2/FLU/RSV testing.  Fact Sheet for Patients: bloggercourse.com  Fact Sheet for Healthcare Providers: seriousbroker.it  This test is not yet approved or cleared by the [place]  FDA and has been authorized for detection and/or diagnosis of SARS-CoV-2 by FDA under an Emergency Use Authorization (EUA). This EUA will remain in effect (meaning this test can be used) for the duration of the COVID-19 declaration under Section 564(b)(1) of the Act, 21 U.S.C. section 360bbb-3(b)(1), unless the authorization is terminated or revoked.     Resp Syncytial Virus by PCR NEGATIVE NEGATIVE Final    Comment: (NOTE) Fact Sheet for Patients: bloggercourse.com  Fact Sheet for Healthcare Providers: seriousbroker.it  This test is not yet approved or cleared by  the [place]  FDA and has been authorized for detection and/or diagnosis of SARS-CoV-2 by FDA under an Emergency Use Authorization (EUA). This EUA will remain in effect (meaning this test can be used) for the duration of the COVID-19 declaration under Section 564(b)(1) of the Act, 21 U.S.C. section 360bbb-3(b)(1), unless the authorization is terminated or revoked.  Performed at [facility], [address] [place], [place] [address]     Labs: CBC: Recent Labs  Lab 10/16/24 1710 10/17/24 0519 10/18/24 0434  WBC 6.2 5.4 6.6  HGB 12.0 12.2 10.8*  HCT 36.3 38.4 32.6*  MCV 88.1 91.4 87.9  PLT 334 320 330   Basic Metabolic Panel: Recent Labs  Lab 10/16/24 1710 10/16/24 2036 10/17/24 0519 10/18/24 0434  NA 137  --  137 137  K 3.7  --  3.7 3.6  CL 98  --  100 103  CO2 28  --  25 25  GLUCOSE 113*  --  104* 106*  BUN 14  --  12 13  CREATININE 1.06*  --  0.97 1.03*  CALCIUM  9.7  --  9.3 9.3  MG  --  1.5*  --  1.9  PHOS  --  2.9  --   --    Liver Function Tests: No results for input(s): AST, ALT, ALKPHOS, BILITOT, PROT, ALBUMIN in the last 168 hours. CBG: No results for input(s): GLUCAP in the last 168 hours.  Discharge time spent: 35 minutes.  Length of inpatient stay: 1 days  Signed: Carliss LELON Canales, DO [facility] 10/18/2024         "

## 2024-10-18 NOTE — Plan of Care (Signed)
  Problem: Education: Goal: Knowledge of General Education information will improve Description: Including pain rating scale, medication(s)/side effects and non-pharmacologic comfort measures Outcome: Progressing   Problem: Clinical Measurements: Goal: Respiratory complications will improve Outcome: Progressing   Problem: Clinical Measurements: Goal: Cardiovascular complication will be avoided Outcome: Progressing   Problem: Activity: Goal: Risk for activity intolerance will decrease Outcome: Progressing   Problem: Pain Managment: Goal: General experience of comfort will improve and/or be controlled Outcome: Progressing   Problem: Safety: Goal: Ability to remain free from injury will improve Outcome: Progressing

## 2024-10-29 ENCOUNTER — Encounter

## 2024-10-29 DIAGNOSIS — I214 Non-ST elevation (NSTEMI) myocardial infarction: Secondary | ICD-10-CM

## 2024-10-29 DIAGNOSIS — Z9861 Coronary angioplasty status: Secondary | ICD-10-CM

## 2024-10-29 NOTE — Progress Notes (Signed)
 Initial phone call completed. Diagnosis can be found in [facility] 1/11. EP Orientation scheduled for Wednesday 2/11 at10am.

## 2024-10-30 ENCOUNTER — Telehealth: Payer: Self-pay | Admitting: Neurosurgery

## 2024-10-30 NOTE — Telephone Encounter (Signed)
 Date: 10/30/24 Provider [facility] Med Requesting: Cyclobenzaprine  Flexeril  10mg  Pharmacy [facility] in [place] Patient contact [phone]

## 2024-10-31 ENCOUNTER — Encounter: Payer: Self-pay | Admitting: Nurse Practitioner

## 2024-10-31 ENCOUNTER — Other Ambulatory Visit: Payer: Self-pay | Admitting: Neurosurgery

## 2024-10-31 ENCOUNTER — Ambulatory Visit: Admitting: Nurse Practitioner

## 2024-10-31 VITALS — BP 120/72 | HR 72 | Ht 65.0 in | Wt 185.0 lb

## 2024-10-31 DIAGNOSIS — I2510 Atherosclerotic heart disease of native coronary artery without angina pectoris: Secondary | ICD-10-CM

## 2024-10-31 DIAGNOSIS — E785 Hyperlipidemia, unspecified: Secondary | ICD-10-CM

## 2024-10-31 DIAGNOSIS — I10 Essential (primary) hypertension: Secondary | ICD-10-CM

## 2024-10-31 DIAGNOSIS — I6523 Occlusion and stenosis of bilateral carotid arteries: Secondary | ICD-10-CM

## 2024-10-31 DIAGNOSIS — D649 Anemia, unspecified: Secondary | ICD-10-CM

## 2024-10-31 DIAGNOSIS — I447 Left bundle-branch block, unspecified: Secondary | ICD-10-CM

## 2024-10-31 DIAGNOSIS — N183 Chronic kidney disease, stage 3 unspecified: Secondary | ICD-10-CM

## 2024-10-31 MED ORDER — CYCLOBENZAPRINE HCL 10 MG PO TABS: 10.0000 mg | ORAL_TABLET | Freq: Three times a day (TID) | ORAL | 1 refills | Status: AC | PRN

## 2024-10-31 NOTE — Progress Notes (Signed)
 "  Office Visit    Patient Name: Amanda Lambert Date of Encounter: 10/31/2024  Primary Care Provider:  Cleotilde, [place]  E, PA Primary Cardiologist:  Gordy Bergamo, MD  Chief Complaint    79 year old female with a history of CAD s/p DES-LAD in 2022, s/p PTCA-mid LAD (ISR) in 09/2024, LBBB, mitral valve regurgitation, bilateral carotid artery stenosis, hypertension, orthostatic hypotension, hyperlipidemia, CKD stage III, arthritis, and GERD who presents for hospital follow-up related to CAD and hypertension.  Past Medical History    Past Medical History:  Diagnosis Date   Anemia    Angina pectoris    Aortic atherosclerosis    Arthritis    Bilateral carotid artery disease 10/15/2019   a.) carotid doppler 10/15/2019: 50-69% BICA; b.) carotid doppler 04/06/2020: 50-69% RICA, 1-15% LICA; c.) carotid doppler 11/03/2020 and 04/29/2021: 50-69% RICA, 16-49% LICA; d.) carotid doppler 03/02/2022: 50-69% BICA; e.) carotid doppler 03/14/2023: 50-69% RICA, 16-49% LICA   Biliary dyskinesia    CAD (coronary artery disease) 06/14/2021   a.) LHC 11/04/2019: 50% mLAD - med mgmt; b.) LHC/PCI 06/14/2021: 40% p-mLAD, 80% mLAD (3.0 x 30 mm Onyx Frontier DES; c.) LHC for FFR study: 07/19/2021: 40% pLAD; RFR 0.92, FFR 0.89, CFR 4.8, IMR 18 --> no significant macro/microvascular disease   CKD (chronic kidney disease), stage III (HCC)    Complication of anesthesia    a.) PONV; b.) delayed emergence   Constipation    Diastolic dysfunction 10/15/2019   a.) TTE 10/15/2019: EF 50-55%, norm LAP, mild LA dil, mod MR, mild-mod TR, G1DD; b.) TTE 05/19/2021: EF 50-55%, mild basal sep hypertrophy, norm LAP, triv MR/TR, G1DD   GERD (gastroesophageal reflux disease)    Hepatitis B 1975   Hypercholesteremia    Hypertension    Inguinal hernia    Irritable bowel syndrome without diarrhea    LBBB (left bundle branch block)    Leukopenia    Long term current use of aspirin     PONV (postoperative nausea and vomiting)    [facility] spotted fever 04/01/2012   adm. to hospital   Syncope 10/17/2019   Thrombocytopenia    Transaminitis    Trochanteric bursitis, right hip    Vaginal Pap smear, abnormal    Varicose veins of leg with edema, bilateral    Weakness    Past Surgical History:  Procedure Laterality Date   APPENDECTOMY  09/25/1961   CHOLECYSTECTOMY  04/26/2012   Procedure: LAPAROSCOPIC CHOLECYSTECTOMY WITH INTRAOPERATIVE CHOLANGIOGRAM;  Surgeon: Deward GORMAN Curvin DOUGLAS, MD;  Location: [facility];  Service: General;  Laterality: N/A;   COLONOSCOPY     CORONARY ANGIOGRAPHY N/A 07/19/2021   Procedure: CORONARY ANGIOGRAPHY (CATH LAB);  Surgeon: Bergamo Gordy, MD;  Location: [facility] INVASIVE CV LAB;  Service: Cardiovascular;  Laterality: N/A;   CORONARY BALLOON ANGIOPLASTY N/A 10/06/2024   Procedure: CORONARY BALLOON ANGIOPLASTY;  Surgeon: Darron Deatrice LABOR, MD;  Location: [facility] INVASIVE CV LAB;  Service: Cardiovascular;  Laterality: N/A;   CORONARY IMAGING/OCT N/A 06/14/2021   Procedure: INTRAVASCULAR IMAGING/OCT;  Surgeon: Bergamo Gordy, MD;  Location: [facility] INVASIVE CV LAB;  Service: Cardiovascular;  Laterality: N/A;   CORONARY IMAGING/OCT N/A 10/06/2024   Procedure: CORONARY IMAGING/OCT;  Surgeon: Darron Deatrice LABOR, MD;  Location: [facility] INVASIVE CV LAB;  Service: Cardiovascular;  Laterality: N/A;   CORONARY PRESSURE/FFR STUDY N/A 07/19/2021   Procedure: INTRAVASCULAR PRESSURE WIRE/FFR STUDY;  Surgeon: Bergamo Gordy, MD;  Location: [facility] INVASIVE CV LAB;  Service: Cardiovascular;  Laterality: N/A;   CORONARY STENT INTERVENTION N/A 06/14/2021  Procedure: CORONARY STENT INTERVENTION;  Surgeon: Ladona Heinz, MD;  Location: [facility] INVASIVE CV LAB;  Service: Cardiovascular;  Laterality: N/A;   DILATION AND CURETTAGE OF UTERUS  09/25/1980   for miscarriage   DILATION AND CURETTAGE, DIAGNOSTIC / THERAPEUTIC  09/25/1970   INSERTION OF MESH Right 05/03/2023   Procedure: INSERTION OF MESH;  Surgeon: Tye Millet, DO;  Location: [facility] ORS;  Service:  General;  Laterality: Right;   IR KYPHO EA ADDL LEVEL THORACIC OR LUMBAR  09/05/2023   IR KYPHO LUMBAR INC FX REDUCE BONE BX UNI/BIL CANNULATION INC/IMAGING  09/05/2023   IR KYPHO THORACIC WITH BONE BIOPSY  09/05/2023   LEFT HEART CATH AND CORONARY ANGIOGRAPHY N/A 11/04/2019   Procedure: LEFT HEART CATH AND CORONARY ANGIOGRAPHY;  Surgeon: Ladona Heinz, MD;  Location: [facility] INVASIVE CV LAB;  Service: Cardiovascular;  Laterality: N/A;   LEFT HEART CATH AND CORONARY ANGIOGRAPHY N/A 06/14/2021   Procedure: LEFT HEART CATH AND CORONARY ANGIOGRAPHY;  Surgeon: Ladona Heinz, MD;  Location: [facility] INVASIVE CV LAB;  Service: Cardiovascular;  Laterality: N/A;   LEFT HEART CATH AND CORONARY ANGIOGRAPHY N/A 10/06/2024   Procedure: LEFT HEART CATH AND CORONARY ANGIOGRAPHY;  Surgeon: Darron Deatrice LABOR, MD;  Location: [facility] INVASIVE CV LAB;  Service: Cardiovascular;  Laterality: N/A;   TONSILLECTOMY  1964  - approximate   TOTAL SHOULDER ARTHROPLASTY Right 01/28/2024   WRIST FRACTURE SURGERY  09/25/2006   right    Allergies  Allergies[1]   Labs/Other Studies Reviewed    The following studies were reviewed today:  Cardiac Studies & Procedures   ______________________________________________________________________________________________ CARDIAC CATHETERIZATION  CARDIAC CATHETERIZATION 10/06/2024  Conclusion   Mid LAD-1 lesion is 10% stenosed.   Dist LAD lesion is 30% stenosed.   Mid LAD-2 lesion is 90% stenosed.   2nd Diag lesion is 30% stenosed.   Prox LAD to Mid LAD lesion is 30% stenosed.   Prox RCA lesion is 20% stenosed.   Balloon angioplasty was performed using a BALLOON TREK RX 2.5X12.   Post intervention, there is a 0% residual stenosis.   The left ventricular systolic function is normal.   LV end diastolic pressure is normal.   The left ventricular ejection fraction is 55-65% by visual estimate.   In the absence of any other complications or medical issues, we expect the patient to be ready for  discharge from an interventional cardiology perspective on 10/07/2024.   Recommend uninterrupted dual antiplatelet therapy with Aspirin  81mg  daily and Ticagrelor  90mg  twice daily for a minimum of 12 months (ACS-Class I recommendation).  1.  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  No other obstructive disease. 2.  Normal LV systolic function normal left ventricular end-diastolic pressure. 3.  Successful balloon angioplasty to the mid LAD for in-stent restenosis.  OCT was performed before and showed severe neointimal hyperplasia with no evidence of mechanical issues or stent underexpansion.  The restenosis did not extend outside the stent distally.  Recommendations: If she develops recurrent restenosis, recommend treatment with a drug-coated balloon. I switch propranolol  to carvedilol  for better blood pressure control.  Findings Coronary Findings Diagnostic  Dominance: Right  Left Anterior Descending Prox LAD to Mid LAD lesion is 30% stenosed. The lesion is discrete. Mid LAD-1 lesion is 10% stenosed. The lesion was previously treated . Mid LAD-2 lesion is 90% stenosed. The lesion is type C. The lesion was previously treated using a drug eluting stent over 2 years ago. Previously placed stent displays restenosis.  Optical coherence tomography (OCT) was performed. OCT was performed after balloon angioplasty with a 2.5 mm balloon.  It showed that the previous stent was appropriately sized and well-expanded.  However, there was severe in-stent restenosis all within the stented segment due to neointimal hyperplasia. Dist LAD lesion is 30% stenosed.  Second Diagonal Branch 2nd Diag lesion is 30% stenosed.  Left Circumflex Vessel is angiographically normal.  Right Coronary Artery Prox RCA lesion is 20% stenosed.  Intervention  Mid LAD-2 lesion Angioplasty Lesion length:  19 mm. WIRE RUNTHROUGH .[id number] guidewire used to cross lesion.  Balloon angioplasty was performed using a BALLOON TREK RX 2.5X12. Maximum pressure: 12 atm. Inflation time: 30 sec.  A second ballloon was used, using a non-compliant BALLOON [place] TREK NEO RX 3.25X15. Maximum pressure:  18 atm. Inflation time:  30 sec.  A third ballloon was used, using a scoring BALLOON SCOREFLEX 3.0X15. Maximum pressure:  20 atm.  Inflation time:  30 sec. A scoring balloon was used in the side branch. Post-Intervention Lesion Assessment The intervention was successful. Pre-interventional TIMI flow is 3. Post-intervention TIMI flow is 3. There is a 0% residual stenosis post intervention.   CARDIAC CATHETERIZATION  CARDIAC CATHETERIZATION 07/19/2021  Conclusion Left Heart Catheterization 07/19/21: LM: Smooth and normal. CX: Smooth and normal. LAD: Large vessel, gives origin to large D1.  Just after the origin of large D1 and a large septal perforator, there is a stent that was previously placed 06/14/2021, 3.0 x 30 mm Onyx is widely patent.  Prior to the stent, the proximal segment at the bifurcation of D1 and large septal perforator, there is a 30 40% stenosis. RFR was 0.92, FFR 0.89, CFR 4.8, IMR 18.  Findings do not suggest macrovascular or microvascular disease. RCA: Not studied, previously normal.  Impression: Patient symptoms of chest pain that is relieved with nitroglycerin , does not appear to be from cardiac etiology.  Will recommend performing a routine treadmill exercise stress test to see if there is any inducible ST segment changes.  Otherwise I have reassured the patient.  50 mL contrast utilized.  Findings Coronary Findings Diagnostic  Dominance: Right  Left Anterior Descending Prox LAD to Mid LAD lesion is 40% stenosed. The lesion is discrete. The stenosis was measured by a visual reading. Pressure wire/FFR was performed on the lesion. FFR: 0.89. RFR was 0.92, FFR 0.89, CFR 4.8, IMR 18.  Findings do not suggest macrovascular or microvascular  disease. Non-stenotic Mid LAD lesion was previously treated. The lesion is tubular.  Left Circumflex Vessel is angiographically normal.  Intervention  No interventions have been documented.   STRESS TESTS  PCV MYOCARDIAL PERFUSION WITH LEXISCAN  10/20/2019  Narrative Lexiscan  (Walking with mod Bruce)Tetrofosmin  Stress Test  10/20/2019: Nondiagnostic ECG stress. Mild degree large extent fixed perfusion defect located in the mid anteroseptal wall, mid inferoseptal wall, basal anteroseptal wall and basal inferoseptal wall. This isc onsistent with LBBB, mild ischemia cannot be completely excluded. All segments of left ventricle demonstrated normal wall motion and thickening. Stress LV EF is normal 61%. Low risk. No previous exam available for comparison.   ECHOCARDIOGRAM  ECHOCARDIOGRAM COMPLETE 10/06/2024  Narrative ECHOCARDIOGRAM REPORT    Patient Name:   Amanda Lambert Date of Exam: 10/06/2024 Medical Rec #:  [id number]      Height:       65.0 in Accession #:    [id number]     Weight:       178.6 lb Date of Birth:  1946/05/19  BSA:          1.885 m Patient Age:    78 years       BP:           164/73 mmHg Patient Gender: F              HR:           70 bpm. Exam Location:  [facility]  Procedure: 2D Echo, Cardiac Doppler and Color Doppler (Both Spectral and Color Flow Doppler were utilized during procedure).  Indications:     R07.9 Chest pain  History:         Patient has prior history of Echocardiogram examinations, most recent 05/19/2021. CAD, Arrythmias:LBBB, Signs/Symptoms:Syncope; Risk Factors:Hypertension.  Sonographer:     Carl Rodgers-Jones RDCS Referring Phys:  [id number] FLYJFFJI A ARIDA Diagnosing Phys: Lonni Hanson MD  IMPRESSIONS   1. Left ventricular ejection fraction, by estimation, is 55 to 60%. The left ventricle has normal function. The left ventricle has no regional wall motion abnormalities. There is mild left ventricular hypertrophy. Left ventricular  diastolic parameters are consistent with Grade I diastolic dysfunction (impaired relaxation). 2. Right ventricular systolic function is normal. The right ventricular size is normal. 3. The mitral valve is degenerative. Mild mitral valve regurgitation. No evidence of mitral stenosis. 4. The aortic valve was not well visualized. There is mild calcification of the aortic valve. Aortic valve regurgitation is not visualized. Aortic valve sclerosis/calcification is present, without any evidence of aortic stenosis.  FINDINGS Left Ventricle: Left ventricular ejection fraction, by estimation, is 55 to 60%. The left ventricle has normal function. The left ventricle has no regional wall motion abnormalities. The left ventricular internal cavity size was normal in size. There is mild left ventricular hypertrophy. Abnormal (paradoxical) septal motion, consistent with left bundle branch block. Left ventricular diastolic parameters are consistent with Grade I diastolic dysfunction (impaired relaxation).  Right Ventricle: The right ventricular size is normal. No increase in right ventricular wall thickness. Right ventricular systolic function is normal.  Left Atrium: Left atrial size was normal in size.  Right Atrium: Right atrial size was normal in size.  Pericardium: There is no evidence of pericardial effusion.  Mitral Valve: The mitral valve is degenerative in appearance. There is mild thickening of the mitral valve leaflet(s). There is mild calcification of the mitral valve leaflet(s). Mild mitral valve regurgitation. No evidence of mitral valve stenosis.  Tricuspid Valve: The tricuspid valve is not well visualized. Tricuspid valve regurgitation is trivial.  Aortic Valve: The aortic valve was not well visualized. There is mild calcification of the aortic valve. Aortic valve regurgitation is not visualized. Aortic valve sclerosis/calcification is present, without any evidence of aortic stenosis. Aortic  valve mean gradient measures 5.6 mmHg. Aortic valve peak gradient measures 10.7 mmHg. Aortic valve area, by VTI measures 1.84 cm.  Pulmonic Valve: The pulmonic valve was not well visualized. Pulmonic valve regurgitation is not visualized. No evidence of pulmonic stenosis.  Aorta: The aortic root is normal in size and structure.  Pulmonary Artery: The pulmonary artery is not well seen.  IAS/Shunts: The interatrial septum was not well visualized.   LEFT VENTRICLE PLAX 2D LVIDd:         3.90 cm   Diastology LVIDs:         2.80 cm   LV e' medial:    3.86 cm/s LV PW:         1.10 cm   LV E/e' medial:  11.2 LV IVS:  1.10 cm   LV e' lateral:   4.73 cm/s LVOT diam:     1.90 cm   LV E/e' lateral: 9.2 LV SV:         55 LV SV Index:   29 LVOT Area:     2.84 cm   RIGHT VENTRICLE RV Basal diam:  3.60 cm RV S prime:     13.70 cm/s TAPSE (M-mode): 2.4 cm  LEFT ATRIUM             Index        RIGHT ATRIUM           Index LA diam:        4.00 cm 2.12 cm/m   RA Area:     11.90 cm LA Vol (A2C):   38.8 ml 20.58 ml/m  RA Volume:   30.00 ml  15.91 ml/m LA Vol (A4C):   52.2 ml 27.69 ml/m LA Biplane Vol: 46.8 ml 24.83 ml/m AORTIC VALVE AV Area (Vmax):    1.72 cm AV Area (Vmean):   1.63 cm AV Area (VTI):     1.84 cm AV Vmax:           163.86 cm/s AV Vmean:          111.084 cm/s AV VTI:            0.301 m AV Peak Grad:      10.7 mmHg AV Mean Grad:      5.6 mmHg LVOT Vmax:         99.65 cm/s LVOT Vmean:        63.750 cm/s LVOT VTI:          0.195 m LVOT/AV VTI ratio: 0.65  AORTA Ao Root diam: 3.30 cm  MITRAL VALVE MV Area (PHT): 2.93 cm    SHUNTS MV Decel Time: 259 msec    Systemic VTI:  0.20 m MV E velocity: 43.45 cm/s  Systemic Diam: 1.90 cm MV A velocity: 83.30 cm/s MV E/A ratio:  0.52  Christopher End MD Electronically signed by Lonni Hanson MD Signature Date/Time: 10/07/2024/7:37:00 AM    Final           ______________________________________________________________________________________________     Recent Labs: 10/02/2024: ALT 9 10/16/2024: Pro Brain Natriuretic Peptide 896.0 10/18/2024: BUN 13; Creatinine, Ser 1.03; Hemoglobin 10.8; Magnesium  1.9; Platelets 330; Potassium 3.6; Sodium 137  Recent Lipid Panel    Component Value Date/Time   CHOL 171 10/06/2024 0522   CHOL 147 08/03/2023 1030   TRIG 121 10/06/2024 0522   HDL 95 10/06/2024 0522   HDL 88 08/03/2023 1030   CHOLHDL 1.8 10/06/2024 0522   VLDL 24 10/06/2024 0522   LDLCALC 52 10/06/2024 0522   LDLCALC 44 08/03/2023 1030    History of Present Illness    79 year old female with the above past medical history including CAD s/p DES-LAD in 2022, s/p PTCA-mid LAD (ISR) in 09/2024, LBBB, mitral valve regurgitation, bilateral carotid artery stenosis, hypertension, orthostatic hypotension, hyperlipidemia, CKD stage III, arthritis, and GERD who presents for hospital follow-up related to CAD and hypertension.  Nuclear stress in 2021 was low risk.  Cardiac catheterization in 10/2019 showed nonobstructive CAD with 50% mid LAD stenosis, medical therapy was recommended.  She underwent repeat LHC in 05/2021 in the setting of recurrent angina which revealed proximal to mid LAD 40% stenosis, 80% mid LAD stenosis s/p DES.  Cardiac catheterization in 07/14/2021 showed widely patent LAD stent, otherwise nonobstructive CAD, she was noted to likely have  microvascular disease.  She was last seen in the office on 03/06/2024 and was stable from a cardiac standpoint.  She reported stable intermittent chest pain.  She was hospitalized in January 2026 in the setting of unstable angina, uncontrolled hypertension.  Troponin was minimally elevated at 24.  She underwent LHC on 10/06/2024 which demonstrated severe single-vessel CAD due to severe in-stent restenosis of the mid LAD, otherwise no other obstructive disease, s/p PTCA-mid LAD.  Echocardiogram in 09/2020  showed EF 55 to 60%, no RWMA, mild LVH, G1 DD, normal RV systolic function, mild mitral valve regurgitation, aortic valve sclerosis without evidence of aortic stenosis.  She was hospitalized from 10/16/2024 to 10/18/2024 in the setting of near syncope, orthostatics were positive.  She declined further testing.  Ranolazine  was discontinued and her symptoms improved.  She was noted to have mild anemia.  She was discharged home in stable condition on 10/18/2024.  She presents today for follow-up.  Since her hospitalization she has been stable from a cardiac standpoint.  She continues to note shortness of breath, decreased energy, overall improved.  She denies chest pain.  She has intermittent nonpitting bilateral lower extremity edema, generally well-controlled with Lasix .  She denies any palpitations, dizziness, presyncope, syncope, PND, orthopnea, weight gain. Overall, her symptoms have improved.     Home Medications    Current Outpatient Medications  Medication Sig Dispense Refill   Abaloparatide  3120 MCG/1.56ML SOPN Inject 80 mcg into the skin daily. 1.56 mL 0   aspirin  EC 81 MG tablet Take 81 mg by mouth daily. Swallow whole.     carvedilol  (COREG ) 12.5 MG tablet Take 1 tablet (12.5 mg total) by mouth 2 (two) times daily with a meal. 60 tablet 0   cyclobenzaprine  (FLEXERIL ) 10 MG tablet Take 1 tablet (10 mg total) by mouth 3 (three) times daily as needed for muscle spasms. 90 tablet 1   EPINEPHrine  0.3 mg/0.3 mL IJ SOAJ injection Inject 0.3 mg into the muscle once as needed for anaphylaxis.     esomeprazole (NEXIUM) 40 MG capsule Take 40 mg by mouth in the morning.     furosemide  (LASIX ) 20 MG tablet Take 1 tablet (20 mg total) by mouth daily. 30 tablet 0   gabapentin  (NEURONTIN ) 300 MG capsule Take 300 mg by mouth 2 (two) times daily.     isosorbide  mononitrate (IMDUR ) 120 MG 24 hr tablet TAKE 1 TABLET BY MOUTH EVERY DAY 90 tablet 2   magnesium  30 MG tablet Take 30 mg by mouth 2 (two) times daily.      Menaquinone-7 (K2 PO) Take by mouth daily.     nitroGLYCERIN  (NITROSTAT ) 0.4 MG SL tablet Place 1 tablet (0.4 mg total) under the tongue every 5 (five) minutes as needed for chest pain. 100 tablet 12   ondansetron  (ZOFRAN -ODT) 4 MG disintegrating tablet Take 1 tablet (4 mg total) by mouth every 8 (eight) hours as needed for nausea or vomiting. 20 tablet 0   potassium chloride  SA (KLOR-CON  M20) 20 MEQ tablet Take 1 tablet (20 mEq total) by mouth daily. 90 tablet 3   rosuvastatin  (CRESTOR ) 20 MG tablet Take 1 tablet (20 mg total) by mouth daily. 30 tablet 0   ticagrelor  (BRILINTA ) 90 MG TABS tablet Take 1 tablet (90 mg total) by mouth 2 (two) times daily. 60 tablet 0   Vitamin D , Ergocalciferol , (DRISDOL) 1.25 MG (50000 UNIT) CAPS capsule Take 1 capsule by mouth every 7 (seven) days.     No current facility-administered medications for  this visit.     Review of Systems    She denies chest pain, palpitations, pnd, orthopnea, n, v, dizziness, syncope, weight gain, or early satiety. All other systems reviewed and are otherwise negative except as noted above.   Physical Exam    VS:  BP 120/72 (BP Location: Right Arm, Patient Position: Sitting, Cuff Size: Normal)   Pulse 72   Ht 5' 5 (1.651 m)   Wt 185 lb (83.9 kg)   SpO2 97%   BMI 30.79 kg/m   GEN: Well nourished, well developed, in no acute distress. HEENT: normal. Neck: Supple, no JVD, carotid bruits, or masses. Cardiac: RRR, no murmurs, rubs, or gallops. No clubbing, cyanosis, edema.  Radials/DP/PT 2+ and equal bilaterally.  Left radial cath site without bruising, bleeding, hematoma. Respiratory:  Respirations regular and unlabored, clear to auscultation bilaterally. GI: Soft, nontender, nondistended, BS + x 4. MS: no deformity or atrophy. Skin: warm and dry, no rash. Neuro:  Strength and sensation are intact. Psych: Normal affect.  Accessory Clinical Findings    ECG personally reviewed by me today -    - no EKG in office today.     Lab Results  Component Value Date   WBC 6.6 10/18/2024   HGB 10.8 (L) 10/18/2024   HCT 32.6 (L) 10/18/2024   MCV 87.9 10/18/2024   PLT 330 10/18/2024   Lab Results  Component Value Date   CREATININE 1.03 (H) 10/18/2024   BUN 13 10/18/2024   NA 137 10/18/2024   K 3.6 10/18/2024   CL 103 10/18/2024   CO2 25 10/18/2024   Lab Results  Component Value Date   ALT 9 10/02/2024   AST 20 10/02/2024   ALKPHOS 102 10/02/2024   BILITOT 0.6 10/02/2024   Lab Results  Component Value Date   CHOL 171 10/06/2024   HDL 95 10/06/2024   LDLCALC 52 10/06/2024   TRIG 121 10/06/2024   CHOLHDL 1.8 10/06/2024    Lab Results  Component Value Date   HGBA1C 5.5 10/05/2024    Assessment & Plan    1. CAD: S/p DES-LAD in 2022, s/p PTCA-mid LAD (ISR) in 09/2024. Stable with no anginal symptoms. Echocardiogram in 09/2020 showed EF 55 to 60%, no RWMA, mild LVH, G1 DD, normal RV systolic function, mild mitral valve regurgitation, aortic valve sclerosis without evidence of aortic stenosis.  She denies chest pain.  She does continue to note some dyspnea on exertion, generalized fatigue, overall improved.  Continue to monitor symptoms.  She plans to participate in cardiac rehab.  Continue aspirin , Brilinta , carvedilol , Imdur , Lasix , and Crestor .  2. LBBB: Stable on most recent EKG. Recent cath/echo as above.   3. Bilateral carotid artery stenosis: Carotid ultrasound in 02/2024 revealed 40 to 59% R ICA stenosis, 1 to 39% LICA stenosis. Asymptomatic. Follow-up study was recommended in 1 year (02/2025).   4. Hypertension/orthostatic hypotension: Improved with discontinuation of ranolazine .  BP stable in office today.  She denies any dizziness, presyncope or syncope.  Continue current antihypertensive regimen.  5. Hyperlipidemia: LDL was 52 in 09/2024.  Crestor  was recently increased to 20 mg daily.  Will repeat fasting lipids, LFTs in 6 to 8 weeks.  Continue Crestor .  6. CKD stage III: Creatinine was stable  at 1.03 on 10/18/2024.  Repeat CMET pending as above.  7. Anemia: Hemoglobin was 10.8 on most recent labs in 09/2024.  Will plan for repeat CBC.  8.  Disposition: Follow-up in 3-4 months with Dr. Ladona.  Damien JAYSON Braver, NP 10/31/2024, 4:28 PM       [1]  Allergies Allergen Reactions   Ace Inhibitors Anaphylaxis    Angioedema.   Bee Venom Anaphylaxis and Other (See Comments)   Levofloxacin      Other Reaction(s): Not available  levofloxacin    Shellfish Allergy Hives, Rash and Dermatitis    splotches  Shellfish (substance)   Thorazine [Chlorpromazine] Anaphylaxis   Wasp Venom Anaphylaxis   Levaquin  [Levofloxacin  In D5w] Nausea And Vomiting    Stated by patient she could not handle levaquin  even with antiemetics    Amlodipine      severe leg edema   Atropine Sulfate Other (See Comments)   Elemental Sulfur Itching        Glycopyrrolate  Other (See Comments)   Oxycodone  Other (See Comments)    hallucinations   Ramipril Other (See Comments)   Ranexa  [Ranolazine ]     Dropped BP   Sulfa Antibiotics Other (See Comments)   Compazine [Prochlorperazine Edisylate] Anxiety   Latex Rash   "

## 2024-10-31 NOTE — Telephone Encounter (Signed)
 Patient notified verbalized understanding

## 2024-10-31 NOTE — Patient Instructions (Addendum)
 Medication Instructions:  Your physician recommends that you continue on your current medications as directed. Please refer to the Current Medication list given to you today.   *If you need a refill on your cardiac medications before your next appointment, please call your pharmacy*  Lab Work: Fasting lipid panel, CBC & CMET in 6-8 weeks  Testing/Procedures: NONE ordered at this time of appointment    Follow-Up: At [facility], you and your health needs are our priority.  As part of our continuing mission to provide you with exceptional heart care, our providers are all part of one team.  This team includes your primary Cardiologist (physician) and Advanced Practice Providers or APPs (Physician Assistants and Nurse Practitioners) who all work together to provide you with the care you need, when you need it.  Your next appointment:   3-4 month(s)  Provider:   Gordy Bergamo, MD    We recommend signing up for the patient portal called MyChart.  Sign up information is provided on this After Visit Summary.  MyChart is used to connect with patients for Virtual Visits (Telemedicine).  Patients are able to view lab/test results, encounter notes, upcoming appointments, etc.  Non-urgent messages can be sent to your provider as well.   To learn more about what you can do with MyChart, go to [email or web address].

## 2024-11-05 ENCOUNTER — Encounter

## 2024-11-13 ENCOUNTER — Ambulatory Visit: Admitting: Neurosurgery
